# Patient Record
Sex: Male | Born: 1965
Health system: Southern US, Community
[De-identification: ages and names within clinical notes are randomized; demographics above are authoritative.]

## PROBLEM LIST (undated history)

## (undated) DIAGNOSIS — F329 Major depressive disorder, single episode, unspecified: Secondary | ICD-10-CM

## (undated) DIAGNOSIS — T7840XA Allergy, unspecified, initial encounter: Secondary | ICD-10-CM

## (undated) DIAGNOSIS — G4730 Sleep apnea, unspecified: Secondary | ICD-10-CM

## (undated) DIAGNOSIS — K219 Gastro-esophageal reflux disease without esophagitis: Secondary | ICD-10-CM

## (undated) DIAGNOSIS — M1990 Unspecified osteoarthritis, unspecified site: Secondary | ICD-10-CM

## (undated) DIAGNOSIS — F32A Depression, unspecified: Secondary | ICD-10-CM

## (undated) DIAGNOSIS — E291 Testicular hypofunction: Secondary | ICD-10-CM

## (undated) DIAGNOSIS — R51 Headache: Secondary | ICD-10-CM

## (undated) HISTORY — DX: Unspecified osteoarthritis, unspecified site: M19.90

## (undated) HISTORY — DX: Headache: R51

## (undated) HISTORY — DX: Allergy, unspecified, initial encounter: T78.40XA

## (undated) HISTORY — DX: Testicular hypofunction: E29.1

## (undated) HISTORY — DX: Depression, unspecified: F32.A

## (undated) HISTORY — DX: Major depressive disorder, single episode, unspecified: F32.9

---
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
## 1991-04-22 HISTORY — PX: NASAL SINUS SURGERY: SHX719

## 2008-12-16 ENCOUNTER — Ambulatory Visit: Payer: Self-pay | Admitting: Internal Medicine

## 2008-12-16 DIAGNOSIS — R51 Headache: Secondary | ICD-10-CM | POA: Insufficient documentation

## 2008-12-16 DIAGNOSIS — R519 Headache, unspecified: Secondary | ICD-10-CM | POA: Insufficient documentation

## 2008-12-16 DIAGNOSIS — R5381 Other malaise: Secondary | ICD-10-CM

## 2008-12-16 DIAGNOSIS — R5383 Other fatigue: Secondary | ICD-10-CM | POA: Insufficient documentation

## 2008-12-16 HISTORY — DX: Other fatigue: R53.83

## 2008-12-18 ENCOUNTER — Ambulatory Visit: Payer: Self-pay | Admitting: Internal Medicine

## 2008-12-18 LAB — CONVERTED CEMR LAB
ALT: 31 units/L (ref 0–53)
AST: 25 units/L (ref 0–37)
Albumin: 4.7 g/dL (ref 3.5–5.2)
Alkaline Phosphatase: 66 units/L (ref 39–117)
BUN: 16 mg/dL (ref 6–23)
Basophils Absolute: 0 10*3/uL (ref 0.0–0.1)
Basophils Relative: 0 % (ref 0–1)
Bilirubin, Direct: 0.3 mg/dL (ref 0.0–0.3)
CO2: 24 meq/L (ref 19–32)
Calcium: 9.9 mg/dL (ref 8.4–10.5)
Chloride: 100 meq/L (ref 96–112)
Cholesterol: 141 mg/dL (ref 0–200)
Creatinine, Ser: 1.03 mg/dL (ref 0.40–1.50)
Creatinine, Urine: 157.1 mg/dL
Eosinophils Absolute: 0.1 10*3/uL (ref 0.0–0.7)
Eosinophils Relative: 2 % (ref 0–5)
Glucose, Bld: 145 mg/dL — ABNORMAL HIGH (ref 70–99)
HCT: 45.7 % (ref 39.0–52.0)
HDL: 57 mg/dL (ref >39)
Hemoglobin: 14.5 g/dL (ref 13.0–17.0)
Hgb A1c MFr Bld: 9.8 % — ABNORMAL HIGH (ref 4.6–6.1)
Indirect Bilirubin: 1.2 mg/dL — ABNORMAL HIGH (ref 0.0–0.9)
LDL Cholesterol: 67 mg/dL (ref 0–99)
Lymphocytes Relative: 30 % (ref 12–46)
Lymphs Abs: 1.9 10*3/uL (ref 0.7–4.0)
MCHC: 31.7 g/dL (ref 30.0–36.0)
MCV: 85.3 fL (ref 78.0–100.0)
Microalb Creat Ratio: 3.4 mg/g (ref 0.0–30.0)
Microalb, Ur: 0.54 mg/dL (ref 0.00–1.89)
Monocytes Absolute: 0.6 10*3/uL (ref 0.1–1.0)
Monocytes Relative: 10 % (ref 3–12)
Neutro Abs: 3.8 10*3/uL (ref 1.7–7.7)
Neutrophils Relative %: 59 % (ref 43–77)
Platelets: 231 10*3/uL (ref 150–400)
Potassium: 4.5 meq/L (ref 3.5–5.3)
RBC: 5.36 M/uL (ref 4.22–5.81)
RDW: 13 % (ref 11.5–15.5)
Sodium: 139 meq/L (ref 135–145)
TSH: 1.473 microintl units/mL (ref 0.350–4.500)
Total Bilirubin: 1.5 mg/dL — ABNORMAL HIGH (ref 0.3–1.2)
Total CHOL/HDL Ratio: 2.5
Total Protein: 7.1 g/dL (ref 6.0–8.3)
Triglycerides: 84 mg/dL (ref <150)
VLDL: 17 mg/dL (ref 0–40)
WBC: 6.4 10*3/uL (ref 4.0–10.5)

## 2008-12-19 ENCOUNTER — Telehealth: Payer: Self-pay | Admitting: Internal Medicine

## 2008-12-23 ENCOUNTER — Ambulatory Visit: Payer: Self-pay | Admitting: Internal Medicine

## 2008-12-23 ENCOUNTER — Telehealth: Payer: Self-pay | Admitting: Internal Medicine

## 2009-01-02 ENCOUNTER — Telehealth: Payer: Self-pay | Admitting: Internal Medicine

## 2009-01-12 ENCOUNTER — Telehealth: Payer: Self-pay | Admitting: Internal Medicine

## 2009-02-17 ENCOUNTER — Telehealth: Payer: Self-pay | Admitting: Internal Medicine

## 2009-03-02 ENCOUNTER — Encounter: Payer: Self-pay | Admitting: Internal Medicine

## 2009-03-02 LAB — CONVERTED CEMR LAB
BUN: 11 mg/dL (ref 6–23)
CO2: 26 meq/L (ref 19–32)
Calcium: 9.5 mg/dL (ref 8.4–10.5)
Chloride: 99 meq/L (ref 96–112)
Creatinine, Ser: 1.03 mg/dL (ref 0.40–1.50)
Glucose, Bld: 252 mg/dL — ABNORMAL HIGH (ref 70–99)
Hgb A1c MFr Bld: 8.6 % — ABNORMAL HIGH (ref 4.6–6.1)
Potassium: 4.4 meq/L (ref 3.5–5.3)
Sodium: 137 meq/L (ref 135–145)

## 2009-03-04 ENCOUNTER — Ambulatory Visit: Payer: Self-pay | Admitting: Internal Medicine

## 2009-03-06 ENCOUNTER — Telehealth: Payer: Self-pay | Admitting: Internal Medicine

## 2009-03-23 ENCOUNTER — Ambulatory Visit: Payer: Self-pay | Admitting: Internal Medicine

## 2009-03-23 DIAGNOSIS — J018 Other acute sinusitis: Secondary | ICD-10-CM | POA: Insufficient documentation

## 2009-03-26 ENCOUNTER — Telehealth: Payer: Self-pay | Admitting: Internal Medicine

## 2009-04-07 ENCOUNTER — Ambulatory Visit: Payer: Self-pay | Admitting: Internal Medicine

## 2009-04-07 LAB — CONVERTED CEMR LAB: Hgb A1c MFr Bld: 9 % — ABNORMAL HIGH (ref 4.6–6.1)

## 2009-04-10 ENCOUNTER — Ambulatory Visit: Payer: Self-pay | Admitting: Internal Medicine

## 2009-04-10 LAB — CONVERTED CEMR LAB: Sed Rate: 18 mm/hr — ABNORMAL HIGH (ref 0–16)

## 2009-04-13 ENCOUNTER — Encounter: Payer: Self-pay | Admitting: Internal Medicine

## 2009-04-13 LAB — CONVERTED CEMR LAB
Ferritin: 88 ng/mL (ref 22–322)
Iron: 62 ug/dL (ref 42–165)
Saturation Ratios: 21 % (ref 20–55)
TIBC: 297 ug/dL (ref 215–435)
UIBC: 235 ug/dL

## 2009-05-08 ENCOUNTER — Telehealth: Payer: Self-pay | Admitting: Internal Medicine

## 2009-05-20 ENCOUNTER — Telehealth: Payer: Self-pay | Admitting: Internal Medicine

## 2009-05-26 ENCOUNTER — Ambulatory Visit: Payer: Self-pay | Admitting: Internal Medicine

## 2009-05-26 DIAGNOSIS — B356 Tinea cruris: Secondary | ICD-10-CM | POA: Insufficient documentation

## 2009-05-29 ENCOUNTER — Telehealth: Payer: Self-pay | Admitting: Internal Medicine

## 2009-06-08 ENCOUNTER — Telehealth: Payer: Self-pay | Admitting: Internal Medicine

## 2009-06-09 ENCOUNTER — Telehealth: Payer: Self-pay | Admitting: Internal Medicine

## 2009-07-20 ENCOUNTER — Ambulatory Visit: Payer: Self-pay | Admitting: Internal Medicine

## 2009-07-20 DIAGNOSIS — R300 Dysuria: Secondary | ICD-10-CM | POA: Insufficient documentation

## 2009-07-20 LAB — CONVERTED CEMR LAB
BUN: 11 mg/dL (ref 6–23)
CO2: 25 meq/L (ref 19–32)
Calcium: 9.7 mg/dL (ref 8.4–10.5)
Chloride: 100 meq/L (ref 96–112)
Creatinine, Ser: 0.94 mg/dL (ref 0.40–1.50)
Glucose, Bld: 194 mg/dL — ABNORMAL HIGH (ref 70–99)
Hgb A1c MFr Bld: 8.5 % — ABNORMAL HIGH (ref <5.7)
PSA: 0.34 ng/mL (ref 0.10–4.00)
Potassium: 4.3 meq/L (ref 3.5–5.3)
Sodium: 136 meq/L (ref 135–145)

## 2009-07-21 ENCOUNTER — Encounter: Payer: Self-pay | Admitting: Internal Medicine

## 2009-11-13 ENCOUNTER — Telehealth: Payer: Self-pay | Admitting: Internal Medicine

## 2009-11-26 ENCOUNTER — Ambulatory Visit: Payer: Self-pay | Admitting: Internal Medicine

## 2009-11-26 LAB — CONVERTED CEMR LAB
BUN: 9 mg/dL (ref 6–23)
CO2: 29 meq/L (ref 19–32)
Calcium: 9.1 mg/dL (ref 8.4–10.5)
Chloride: 99 meq/L (ref 96–112)
Creatinine, Ser: 0.98 mg/dL (ref 0.40–1.50)
Glucose, Bld: 295 mg/dL — ABNORMAL HIGH (ref 70–99)
Hgb A1c MFr Bld: 10.2 % — ABNORMAL HIGH (ref <5.7)
Potassium: 4.7 meq/L (ref 3.5–5.3)
Sodium: 136 meq/L (ref 135–145)

## 2009-11-27 ENCOUNTER — Telehealth: Payer: Self-pay | Admitting: Internal Medicine

## 2009-12-17 ENCOUNTER — Ambulatory Visit: Payer: Self-pay | Admitting: Internal Medicine

## 2009-12-17 DIAGNOSIS — S335XXA Sprain of ligaments of lumbar spine, initial encounter: Secondary | ICD-10-CM | POA: Insufficient documentation

## 2010-01-05 ENCOUNTER — Encounter: Payer: Self-pay | Admitting: Internal Medicine

## 2010-04-20 NOTE — Assessment & Plan Note (Signed)
Summary: head congestion cough/mhf   Vital Signs:  Patient profile:   45 year old male Height:      73 inches Weight:      208.25 pounds BMI:     27.57 O2 Sat:      97 % on Room air Temp:     97.9 degrees F oral Pulse rate:   98 / minute BP sitting:   110 / 70  (right arm)  Vitals Entered By: Lucious Groves (March 23, 2009 3:19 PM)  O2 Flow:  Room air CC: C/o congestion and cough x 2 weeks. Pt cough is dry now, but was previously producing green/yellow mucous. Robitussin with Codeine no help to pt./kb, URI symptoms Is Patient Diabetic? Yes Pain Assessment Patient in pain? no        Primary Care Provider:  Dondra Spry DO  CC:  C/o congestion and cough x 2 weeks. Pt cough is dry now, but was previously producing green/yellow mucous. Robitussin with Codeine no help to pt./kb, and URI symptoms.  History of Present Illness:  URI Symptoms      This is a 45 year old man who presents with URI symptoms.  The patient reports nasal congestion, purulent nasal discharge, sore throat, and productive cough.  The patient denies fever.  onset of symptoms - 2 weeks.  no improvement with self care measures  Current Medications (verified): 1)  Crestor 10 Mg Tabs (Rosuvastatin Calcium) .... One Tablet By Mouth Three Times Per Week 2)  Lexapro 20 Mg Tabs (Escitalopram Oxalate) .... Take 1 Tablet By Mouth Once A Day 3)  Testim Gel .... Rub One Half Tub Into Arms Once Per Day 4)  Janumet 50-1000 Mg Tabs (Sitagliptin-Metformin Hcl) .... One By Mouth Two Times A Day 5)  Humalog Mix 75/25 Kwikpen 75-25 % Susp (Insulin Lispro Prot & Lispro) .... Use 20-25 Units Two Times A Day 6)  Robitussin With Codeine .... At Bedtime Only 7)  Benadryl 25 Mg Tabs (Diphenhydramine Hcl) .Marland Kitchen.. 1 By Mouth Qam  Allergies (verified): 1)  ! Reglan 2)  ! Phenergan 3)  Actos (Pioglitazone Hcl)  Past History:  Past Medical History: Depression Diabetes mellitus, type II   Headache  Hyperlipidemia  Hypogonadism  (seen by endo Dr. 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   Neck:  supple, no masses, and no carotid bruits.   Lungs:  normal respiratory effort and normal breath sounds.   Heart:  normal rate, regular rhythm, and no gallop.   Extremities:  No lower extremity edema    Impression & Recommendations:  Problem # 1:  RHINOSINUSITIS, ACUTE (ICD-461.8) pt with 2 weeks of nasal congestion and purulent drainage.  take abx as directed.  use nasal saline irrigation.  Patient advised to call office if symptoms persist or worsen.  His updated medication list for this problem includes:    Cefuroxime Axetil 500 Mg Tabs (Cefuroxime axetil) ..... One by mouth two times a day    Tussionex Pennkinetic Er 8-10 Mg/86ml Lqcr (Chlorpheniramine-hydrocodone) ..... Use 5 ml two times a day prn  Complete Medication List: 1)  Crestor 10 Mg Tabs (Rosuvastatin calcium) .... One tablet by mouth three times  per week 2)  Lexapro 20 Mg Tabs (Escitalopram oxalate) .... Take 1 tablet by mouth once a day 3)  Testim Gel  .... Rub one half tub into arms once per day 4)  Janumet 50-1000 Mg Tabs (Sitagliptin-metformin hcl) .... One by mouth two times a day 5)  Humalog Mix 75/25 Kwikpen 75-25 % Susp (Insulin lispro prot & lispro) .... Use 20-25 units two times a day 6)  Robitussin With Codeine  .... At bedtime only 7)  Benadryl 25 Mg Tabs (Diphenhydramine hcl) .Marland Kitchen.. 1 by mouth qam 8)  Cefuroxime Axetil 500 Mg Tabs (Cefuroxime axetil) .... One by mouth two times a day 9)  Tussionex Pennkinetic Er 8-10 Mg/80ml Lqcr (Chlorpheniramine-hydrocodone) .... Use 5 ml two times a day prn  Patient Instructions: 1)  Use Neil Med sinus rinse 2)  Gargle with salt  water two times a day 3)  Call our office if your symptoms do not  improve or gets worse. Prescriptions: TUSSIONEX PENNKINETIC ER 8-10 MG/5ML LQCR (CHLORPHENIRAMINE-HYDROCODONE) use 5 ml two times a day prn  #120 ml x 0   Entered and Authorized by:   [name]   Signed by:   [name] on 03/23/2009   Method used:   Print then Give to Patient   RxID:   [id number] CEFUROXIME AXETIL 500 MG TABS (CEFUROXIME AXETIL) one by mouth two times a day  #20 x 0   Entered and Authorized by:   [name]   Signed by:   [name] on 03/23/2009   Method used:   Electronically to        [facility] [phone]* (retail)       [address]       [place], [place]  [address]       Ph: [phone] or [phone]       Fax: [phone]   RxID:   [id number]

## 2010-04-20 NOTE — Assessment & Plan Note (Signed)
Summary: 1 month follow up/mhf, resched- jr   Vital Signs:  Patient profile:   45 year old Nathaniel [name] Weight:      213.50 pounds BMI:     Nathaniel.27 O2 Sat:      98 % on Room air Temp:     97.5 degrees F oral Pulse rate:   66 / minute Pulse rhythm:   regular Resp:     18 per minute BP sitting:   96 / 60  (right arm) Cuff size:   large  Vitals Entered By: Glendell Docker CMA (April 10, 2009 8:23 AM)  O2 Flow:  Room air  Primary Care Provider:  [name]  CC:  1 Month Follow up, Headaches, and Type 2 diabetes mellitus follow-up.  History of Present Illness: 1 Month followup   Headaches      This is a 45 year old man who presents with Headaches.  The patient denies nausea, vomiting, tearing of eyes, nasal congestion, and sinus pain.  The headache is described as intermittent. headache is bilateral   Type 2 Diabetes Mellitus Follow-Up      The patient is also here for Type 2 diabetes mellitus follow-up.  The patient reports self managed hypoglycemia, but denies hypoglycemia requiring help.  The patient denies the following symptoms: chest pain.  Since the last visit the patient reports good dietary compliance, compliance with medications, exercising regularly, and monitoring blood glucose.  low blood sugar 54 high 280 avg 130- this blood sugar 100-elevation is usually in the afternoon  Preventive Screening-Counseling & Management  Alcohol-Tobacco     Smoking Status: never  Allergies: 1)  ! Reglan 2)  ! Phenergan 3)  Actos (Pioglitazone Hcl)  Past History:  Past Medical History: Depression Diabetes mellitus, type II   Headache   Hyperlipidemia  Hypogonadism (seen by endo Dr. 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    Physical Exam  General:  alert, well-developed, and well-nourished.   Eyes:  pupils equal, pupils round, and pupils reactive to light.   Ears:  R ear normal and L ear normal.   Lungs:  normal respiratory effort and normal breath sounds.   Heart:  normal rate, regular rhythm, and no gallop.   Extremities:  No lower extremity edema  Neurologic:  cranial nerves II-XII intact and gait normal.   Psych:  normally interactive, good eye contact, not anxious appearing, and not depressed appearing.     Impression & Recommendations:  Problem # 1:  HEADACHE (ICD-784.0) Pt with headaches x 2-3 days.  I suspect stress headache.  check sed rate.    Orders: T-Sed Rate (Automated) [phone])  Problem # 2:  DIABETES MELLITUS, TYPE II (ICD-250.00) Pt having hypoglycemia in PM.  he exercises at night.  pt advised to decrease PM dose of 75/25.   afternoon sugars are high when he eats larger meal.  use humalog at lunch depending on meal.  Pt has hx of hypogonadism.  check iron studies.  screen for hem His updated medication list for this problem includes:    Janumet 50-1000 Mg Tabs (Sitagliptin-metformin hcl) ..... One by mouth two times a day  Humalog Mix 75/25 Kwikpen 75-25 % Susp (Insulin lispro prot & lispro) ..... Use 10-20 units two times a day    Humalog Kwikpen 100 Unit/ml Soln (Insulin lispro (human)) ..... Use 5-10 units before lunch  Problem # 3:  DEPRESSION (ICD-311) stable.  refilled lexapro.  His updated medication list for this problem includes:    Lexapro 20 Mg Tabs (Escitalopram oxalate) .Marland Kitchen... Take 1 tablet by mouth once a day  Complete Medication List: 1)  Crestor 10 Mg Tabs (Rosuvastatin calcium) .... One tablet by mouth three times per week 2)  Lexapro 20 Mg Tabs (Escitalopram oxalate) .... Take 1 tablet by mouth once a day 3)  Janumet 50-1000 Mg Tabs (Sitagliptin-metformin hcl) .... One by mouth two times a day 4)  Humalog Mix 75/25 Kwikpen  75-25 % Susp (Insulin lispro prot & lispro) .... Use 10-20 units two times a day 5)  Metaxalone 800 Mg Tabs (Metaxalone) .... One by mouth two times a day as needed headache 6)  Humalog Kwikpen 100 Unit/ml Soln (Insulin lispro (human)) .... Use 5-10 units before lunch  Patient Instructions: 1)  Please schedule a follow-up appointment in 2 months. Prescriptions: HUMALOG KWIKPEN 100 UNIT/ML SOLN (INSULIN LISPRO (HUMAN)) use 5-10 units before lunch  #1 month x 2   Entered and Authorized by:   [name]   Signed by:   [name] on 04/10/2009   Method used:   Electronically to        [facility] [phone]* (retail)       [address]       [place], [place]  [address]       Ph: [phone] or [phone]       Fax: [phone]   RxID:   [id number] HUMALOG MIX 75/25 KWIKPEN 75-25 % SUSP (INSULIN LISPRO PROT & LISPRO) use 10-20 units two times a day  #1 month x 2   Entered and Authorized by:   [name]   Signed by:   [name] on 04/10/2009   Method used:   Electronically to        [id number] (retail)       [address]       [place], [place]  [address]       Ph: [phone] or [phone]       Fax: [phone]   RxID:   [id number] METAXALONE 800 MG TABS (METAXALONE) one by mouth two times a day as needed headache  #30 x 0   Entered and Authorized by:   [name]   Signed by:   [name] on 04/10/2009   Method used:   Electronically to        [facility] [phone]* (retail)       [address]       [place], [place]  [address]       Ph: [phone] or [phone]       Fax: [phone]   RxID:   [id number]     Current Allergies (reviewed today): ! REGLAN ! PHENERGAN ACTOS (PIOGLITAZONE HCL)

## 2010-04-20 NOTE — Progress Notes (Signed)
Summary: one touch ultra refill  Phone Note Refill Request Message from:  Patient on November 13, 2009 10:28 AM  Refills Requested: Medication #1:  One Touch Ultra Test Strips   [facility] Last seen May, 2011.  Pt states he is checking BS 4-5 times a day. I advised him per last office note it should be 2 hours after lunch and dinner. Pt stated he has always checked sugars in the morning and after every meal. Please advise. Nicki Guadalajara Fergerson CMA Duncan Dull)  November 13, 2009 10:30 AM   Next Appointment Scheduled: 11/26/09 Dr Artist Pais  Follow-up for Phone Call        ok to change rx to qid Follow-up by: [name],  November 13, 2009 5:06 PM  Additional Follow-up for Phone Call Additional follow up Details #1::        Refills sent to pharmacy. Message left on voicemail notifying pt.  Nicki Guadalajara Fergerson CMA Duncan Dull)  November 13, 2009 5:16 PM     New/Updated Medications: ONETOUCH ULTRA TEST  STRP (GLUCOSE BLOOD) check blood sugar four times a day. Prescriptions: ONETOUCH ULTRA TEST  STRP (GLUCOSE BLOOD) check blood sugar four times a day.  #100 x 3   Entered by:   Mervin Kung CMA (AAMA)   Authorized by:   [name]   Signed by:   Mervin Kung CMA (AAMA) on 11/13/2009   Method used:   Electronically to        [facility] [phone]* (retail)       [address]       [place], [place]  [address]       Ph: [phone] or [phone]       Fax: [phone]   RxID:   [id number]

## 2010-04-20 NOTE — Progress Notes (Signed)
Summary: SHOULD HE CONTINUE WITH METFORMIN OR JANUVAMET   Phone Note Call from Patient Call back at Home Phone [phone]   Caller: PT LIVE Call For: YOO  Summary of Call: SHOULD HE CONTINUE WITH METFORMIN OR THE JANUVMET  Initial call taken by: Roselle Locus,  May 29, 2009 11:29 AM  Follow-up for Phone Call        take janumet only Follow-up by: [name],  May 29, 2009 1:00 PM  Additional Follow-up for Phone Call Additional follow up Details #1::        left pt. a detailed mess. to only be taking the Januvmet. If their is any questions call the office Additional Follow-up by: Michaelle Copas,  May 29, 2009 1:41 PM

## 2010-04-20 NOTE — Letter (Signed)
   [place] [facility] [address] [place], [place]  [address]  [place] Phone: [phone]      Jul 21, 2009   JEFF Crego [address] [place], [place] [address]  RE:  LAB RESULTS  Dear  Mr. GURRY,  The following is an interpretation of your most recent lab tests.  Please take note of any instructions provided or changes to medications that have resulted from your lab work.  PSA:  normal - no follow-up needed PSA: 0.34  ELECTROLYTES:  Good - no changes needed  KIDNEY FUNCTION TESTS:  Stable - no changes needed    DIABETIC STUDIES:  Improved - continue management Blood Glucose: 194   HgbA1C: 8.5   Microalbumin/Creatinine Ratio: 3.4          Sincerely Yours,    Dr. Thomos Lemons

## 2010-04-20 NOTE — Progress Notes (Signed)
Summary: Glucose Meter  Phone Note Outgoing Call   Call placed by: Glendell Docker CMA,  June 09, 2009 4:27 PM Call placed to: Patient Summary of Call: call placed to patient at [phone] no answer, left voice message to return call regarding glucose meter Initial call taken by: Glendell Docker CMA,  June 09, 2009 4:28 PM  Follow-up for Phone Call        call placed to patient at [phone], patient states that he has been using the one touch meter for the last 10 years and he had test strips for that type of meter. He states he has ordered a meter and is awaiting the replacement.  In the mean time he is using his sons meter to check his blood sugar twice a day. Follow-up by: Glendell Docker CMA,  June 11, 2009 8:37 AM

## 2010-04-20 NOTE — Progress Notes (Signed)
Summary: Glucose Meter  Phone Note Call from Patient Call back at Home Phone [phone]   Caller: Patient Summary of Call: pt. has lost his glucometer for diabetic readings and was wondering if we have any here in the office he could get? Call patient back and let him know please. Thank you Initial call taken by: Michaelle Copas,  June 08, 2009 10:41 AM  Follow-up for Phone Call        call returned to patient at  [phone], he was informed a Accu- Chek glucose meter would be left at the front  desk for patient pick up Follow-up by: Glendell Docker CMA,  June 08, 2009 3:30 PM

## 2010-04-20 NOTE — Progress Notes (Signed)
Summary: diabetic question  Phone Note Call from Patient   Summary of Call: Pt. states that his insulin regimen he is on is not working out and he would like to discuss this with Dr.Yoo and wants Dr.Yoo to call him back at [phone] to come up with a plan until his appointment date that he moved up sooner to Tues. 05/26/09 at 10:30 Initial call taken by: Michaelle Copas,  May 20, 2009 10:49 AM  Follow-up for Phone Call        please try to get pt earlier appt Follow-up by: [name],  May 21, 2009 11:55 AM  Additional Follow-up for Phone Call Additional follow up Details #1::        called [phone] and no answer, so I left pt. a detailed message stating that Dr.Yoo wanted him to make a sooner appointment. Instructed pt. to call our office and reschedule that.Michaelle Copas  May 21, 2009 1:14 PM  Additional Follow-up by: Michaelle Copas,  May 21, 2009 1:14 PM

## 2010-04-20 NOTE — Progress Notes (Signed)
Summary: endo referral  Phone Note Outgoing Call   Summary of Call: call pt - blood sugar control is worse.  I suggest referral to endocrinologist.  see orders.  If he gets endo appt, he can extend next OV to 3 months Initial call taken by: [name],  November 27, 2009 1:00 PM  Follow-up for Phone Call        Notified pt per Dr Olegario Messier instructions. Pt stated he could not afford the $75 copay to see a specialist and if we weren't comfortable treating him he would find someone else. i advised pt that Dr Artist Pais was referring him to a specialist to better treat his diabetes and this was in the pt's best interest; not that he didn't want to treat the pt anymore. Pt voices understanding but states he still cannot see a specialist due to cost. Mervin Kung CMA Duncan Dull)  November 27, 2009 1:59 PM   Additional Follow-up for Phone Call Additional follow up Details #1::        then keep next OV.   Additional Follow-up by: [name],  November 27, 2009 5:10 PM    Additional Follow-up for Phone Call Additional follow up Details #2::    call placed to patient at [phone], no answer. A detailed voice message was left informing patient per Dr Artist Pais instructions. He was advised to call if any questions Follow-up by: Glendell Docker CMA,  November 30, 2009 8:37 AM

## 2010-04-20 NOTE — Assessment & Plan Note (Signed)
Summary: 2 MONTH FOLLOW UP/MHF, resched- jr   Vital Signs:  Patient profile:   45 year old male Weight:      211 pounds BMI:     27.94 O2 Sat:      100 % on Room air Temp:     97.3 degrees F oral Pulse rate:   72 / minute Pulse rhythm:   regular Resp:     18 per minute BP sitting:   120 / 80  (right arm) Cuff size:   large  Vitals Entered By: Glendell Docker CMA (May 26, 2009 10:44 AM)  O2 Flow:  Room air CC: Rm 3- 2 Month follow up disease management, Type 2 diabetes mellitus follow-up Is Patient Diabetic? Yes  Does patient need assistance? Functional Status Self care Comments low bood sugar 100 high 330 avg 180-190 elevation is late afternoon, this am was 140, had been exercising   Primary Care Provider:  [name]  CC:  Rm 3- 2 Month follow up disease management and Type 2 diabetes mellitus follow-up.  History of Present Illness:  Type 2 Diabetes Mellitus Follow-Up      This is a 45 year old man who presents for Type 2 diabetes mellitus follow-up.  The patient denies polyuria, polydipsia, and self managed hypoglycemia.  The patient denies the following symptoms: chest pain.  Since the last visit the patient reports good dietary compliance, not exercising regularly, and monitoring blood glucose.  blood sugars getting worse  He also c/o redness and skin irritation right groin.  chaffing with exericise  Allergies: 1)  ! Reglan 2)  ! Phenergan 3)  Actos (Pioglitazone Hcl)  Past History:  Past Medical History: Depression Diabetes mellitus, type II   Headache   Hyperlipidemia   Hypogonadism (seen by endo Dr. 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   Lungs:  normal respiratory effort and normal breath sounds.   Heart:  normal rate, regular rhythm,  and no gallop.   Extremities:  No lower extremity edema  Psych:  normally interactive, good eye contact, not anxious appearing, and not depressed appearing.     Impression & Recommendations:  Problem # 1:  DIABETES MELLITUS, TYPE II (ICD-250.00) Assessment Deteriorated Blood sugars getting worse.  DC 75/25.  resume lantus and short acting insulin before meals.  Pt will carb count and adjust short acting insulin according.  Also use less insulin before exercise.  The following medications were removed from the medication list:    Humalog Mix 75/25 Kwikpen 75-25 % Susp (Insulin lispro prot & lispro) ..... Inject subcutaneously 22 units once daily    Humalog Kwikpen 100 Unit/ml Soln (Insulin lispro (human)) ..... Use 5-10 units before lunch His updated medication list for this problem includes:    Janumet 50-1000 Mg Tabs (Sitagliptin-metformin hcl) ..... One by mouth two times a day    Lantus Solostar 100 Unit/ml Soln (Insulin glargine) .Marland KitchenMarland KitchenMarland KitchenMarland Kitchen 35-45 units at bedtime    Apidra Solostar 100 Unit/ml Soln (Insulin glulisine) .Marland KitchenMarland KitchenMarland KitchenMarland Kitchen 10-20 units before meal three times a day  Problem # 2:  DEPRESSION (ICD-311) Assessment: Deteriorated He tried to taper off but felt significantly more irritable.  lexapro is cost prohibitive.  change to sertraline.  His updated medication list for  this problem includes:    Sertraline Hcl 100 Mg Tabs (Sertraline hcl) ..... One by mouth once daily  Problem # 3:  TINEA CRURIS (ICD-110.3) Use nystatin powder to keep area dry.  Use lamisil cream at bedtime.  Complete Medication List: 1)  Crestor 10 Mg Tabs (Rosuvastatin calcium) .... One tablet by mouth three times per week 2)  Sertraline Hcl 100 Mg Tabs (Sertraline hcl) .... One by mouth once daily 3)  Janumet 50-1000 Mg Tabs (Sitagliptin-metformin hcl) .... One by mouth two times a day 4)  Metaxalone 800 Mg Tabs (Metaxalone) .... One by mouth two times a day as needed headache 5)  Lantus Solostar 100 Unit/ml Soln  (Insulin glargine) .... 35-45 units at bedtime 6)  Apidra Solostar 100 Unit/ml Soln (Insulin glulisine) .Marland Kitchen.. 10-20 units before meal three times a day 7)  Pedi-dri 100000 Unit/gm Powd (Nystatin) .... Use bid as directed  Patient Instructions: 1)  Please schedule a follow-up appointment in 1 month. Prescriptions: PEDI-DRI 100000 UNIT/GM POWD (NYSTATIN) use bid as directed  #1 month x 0   Entered and Authorized by:   [name]   Signed by:   [name] on 05/26/2009   Method used:   Electronically to        [id number] (retail)       [address]       [place], [place]  [address]       Ph: [phone] or [phone]       Fax: [phone]   RxID:   [id number] SERTRALINE HCL 100 MG TABS (SERTRALINE HCL) one by mouth once daily  #30 x 2   Entered and Authorized by:   [name]   Signed by:   [name] on 05/26/2009   Method used:   Electronically to        [id number] (retail)       [address]       [place], [place]  [address]       Ph: [phone] or [phone]       Fax: [phone]   RxID:   [id number] APIDRA SOLOSTAR 100 UNIT/ML SOLN (INSULIN GLULISINE) 10-20 units before meal three times a day  #1 month x 2   Entered and Authorized by:   [name]   Signed by:   [name] on 05/26/2009   Method used:   Electronically to        [id number] (retail)       [address]       [place], [place]  [address]       Ph: [phone] or [phone]       Fax: [phone]   RxID:   [id number] LANTUS SOLOSTAR 100 UNIT/ML SOLN (INSULIN GLARGINE) 35-45 units at bedtime  #1 month x 2   Entered and Authorized by:   [name]   Signed by:   [name] on 05/26/2009   Method used:   Electronically to        [facility] [phone]* (retail)       [address]       [place], [place]  [address]       Ph: [phone] or [phone]       Fax: [phone]   RxID:    [id number]   Current Allergies (reviewed today): ! REGLAN ! PHENERGAN ACTOS (PIOGLITAZONE  HCL)

## 2010-04-20 NOTE — Assessment & Plan Note (Signed)
Summary: FOLLOW UP/MHF   Vital Signs:  Patient profile:   45 year old male Height:      73 inches Weight:      205.50 pounds BMI:     27.21 Temp:     98.0 degrees F oral Pulse rate:   102 / minute Pulse rhythm:   regular Resp:     18 per minute BP sitting:   110 / 84  (right arm) Cuff size:   regular  Vitals Entered By: Mervin Kung CMA (Jul 20, 2009 3:10 PM) CC: room 2  Follow up of diabetes.  Pt. would also like to have his prostate checked., Type 2 diabetes mellitus follow-up Is Patient Diabetic? Yes Comments Low BS-- 50  High BS-- 250  Avg  BS-- 150   Primary Care Provider:  DThomos Lemons DO  CC:  room 2  Follow up of diabetes.  Pt. would also like to have his prostate checked. and Type 2 diabetes mellitus follow-up.  History of Present Illness:  Type 2 Diabetes Mellitus Follow-Up      This is a 45 year old man who presents for Type 2 diabetes mellitus follow-up.  The patient denies polyuria, polydipsia, and hypoglycemia requiring help.  The patient denies the following symptoms: chest pain.  Since the last visit the patient reports good dietary compliance and monitoring blood glucose.  AM blood sugar 80-140  patient found out his birth father had prostate cancer.  He requests prostate cancer screening  Allergies: 1)  ! Reglan 2)  ! 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   Lungs:  normal respiratory effort and normal breath sounds.   Heart:  normal rate, regular rhythm,  and no gallop.   Abdomen:  soft, non-tender, and normal bowel sounds.   Prostate:  no gland enlargement, no nodules, and no asymmetry.  unable to fully palpate prostate gland   Impression & Recommendations:  Problem # 1:  DIABETES MELLITUS, TYPE II (ICD-250.00) Assessment Improved blood sugar improved with restart of lantus and meal time insulin.  we discussed carb counting and adjusting mealtime insulin dose accordingiy   The following medications were removed from the medication list:    Apidra Solostar 100 Unit/ml Soln (Insulin glulisine) .Marland KitchenMarland KitchenMarland KitchenMarland Kitchen 10-20 units before meal three times a day His updated medication list for this problem includes:    Janumet 50-1000 Mg Tabs (Sitagliptin-metformin hcl) ..... One by mouth two times a day    Lantus Solostar 100 Unit/ml Soln (Insulin glargine) .Marland Kitchen... 28 units at bedtime    Humalog 100 Unit/ml Soln (Insulin lispro (human)) .Marland KitchenMarland KitchenMarland KitchenMarland Kitchen 10-20 units before meal three times a day  Orders: T- Hemoglobin A1C ([address]) T-Basic Metabolic Panel [phone])  Labs Reviewed: Creat: 0.94 (04/07/2009)    Reviewed HgBA1c results: 9.0 (04/07/2009)  8.6 (03/02/2009)  Problem # 2:  SPECIAL SCREENING MALIGNANT NEOPLASM OF PROSTATE (ICD-V76.44) Family hx of prostate ca.  we  discussed limitations of prostate ca screening. limited prostate exam but no palpable abnormality  Problem # 3:  DYSURIA (ICD-788.1)  Orders: T-PSA ([address]454) UA Dipstick w/o Micro (manual) ([address])  Complete Medication List: 1)  Crestor 10 Mg Tabs (Rosuvastatin calcium) .... One tablet by mouth two times per week 2)  Sertraline Hcl 100 Mg Tabs (Sertraline hcl) .... One by mouth once daily 3)  Janumet 50-1000 Mg Tabs (Sitagliptin-metformin hcl) .... One by mouth two times a day 4)  Lantus Solostar 100 Unit/ml Soln (Insulin glargine) .... 28 units at bedtime 5)  Humalog 100 Unit/ml Soln (Insulin lispro (human)) .Marland Kitchen.. 10-20 units before meal three times a day  Patient  Instructions: 1)  Check your blood sugars 2 hrs after lunch and dinner 2)  Keep blood sugar log 3)  Please schedule a follow-up appointment in 3 months. Prescriptions: HUMALOG 100 UNIT/ML SOLN (INSULIN LISPRO (HUMAN)) 10-20 units before meal three times a day  #1 month x 3   Entered and Authorized by:   [name]   Signed by:   [name] on 07/20/2009   Method used:   [facility]        [facility] [phone]* (retail)       [address]       [place], [place]  [address]       Ph: [phone] or [phone]       Fax: [phone]   RxID:   [id number]   Current Allergies (reviewed today): ! REGLAN ! PHENERGAN ACTOS (PIOGLITAZONE HCL)

## 2010-04-20 NOTE — Miscellaneous (Signed)
Summary: December Lab Orders  Clinical Lists Changes  Orders: Added new Test order of T-Basic Metabolic Panel [id number]) - Signed Added new Test order of T- Hemoglobin A1C [phone]) - Signed Added new Test order of T-Lipid Profile ([id number]) - Signed Added new Test order of TLB-ALT (SGPT) (84460-ALT) - Signed Added new Test order of TLB-AST (SGOT) (84450-SGOT) - Signed

## 2010-04-20 NOTE — Progress Notes (Signed)
Summary: What labs does he need prior to visit?  Phone Note Call from Patient   Caller: Patient Details for Reason: what labs does he need ? Summary of Call: Pt. called and stated that he has a f/u appt. with Dr.Yoo on 1/14 and would like to get labs done prior to that visit. Will you tell me what to order if pt. needs labs, & I will call and set him a lab appt. up with him. Thank you Initial call taken by: Michaelle Copas,  March 26, 2009 1:45 PM  Follow-up for Phone Call        bmet, and A1c  250.02 Follow-up by: [name],  March 26, 2009 5:14 PM  Additional Follow-up for Phone Call Additional follow up Details #1::        Called pt. and LM on Vm stating for pt, to call our office so I could schedule his labs to be done before his f/u with Dr.Yoo. I did go ahead and put labs in the computer that Dr.Yoo ordered Additional Follow-up by: Michaelle Copas,  March 27, 2009 1:48 PM

## 2010-04-20 NOTE — Assessment & Plan Note (Signed)
Summary: 3 month follow up/mhf rsch from bmp list/dt   Vital Signs:  Patient profile:   45 year old male Height:      73 inches Weight:      205 pounds BMI:     27.14 O2 Sat:      98 % on Room air Temp:     97.5 degrees F oral Pulse rate:   67 / minute Pulse rhythm:   regular Resp:     16 per minute BP sitting:   100 / 70  (right arm) Cuff size:   large  Vitals Entered By: Glendell Docker CMA (November 26, 2009 8:29 AM)  O2 Flow:  Room air  Contraindications/Deferment of Procedures/Staging:    Treatment: Flu Shot    Contraindication: allergy     Test/Procedure: FLU VAX    Reason for deferment: patient declined  CC: 3 month follow up Is Patient Diabetic? Yes Did you bring your meter with you today? No Pain Assessment Patient in pain? no      Comments has not been using Lantus consistently due to low blood sugars at night, low blood sugar 55 high 300 avg 200, elevation is usually in the evening   Primary Care Ashtan Laton:  DThomos Lemons DO  CC:  3 month follow up.  History of Present Illness: 45 y/o male for DM II - follow up pt getting up 2-3 AM with low blood sugar  (50-55)  started school since previous visit  gets to bed at midnight - wakes up 6 AM dinner is 5:30 PM  taking lantus at bedtime  intermittent lantus use - AM blood sugar 160's  Preventive Screening-Counseling & Management  Alcohol-Tobacco     Smoking Status: never  Allergies: 1)  ! Reglan 2)  ! 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   Lungs:  normal respiratory effort and normal breath sounds.   Heart:  normal rate, regular rhythm, and no gallop.   Psych:  normally interactive and good eye contact.     Impression & Recommendations:  Problem # 1:  DIABETES MELLITUS, TYPE II (ICD-250.00) Pt having low blood sugars in the middle of night.  he is only taking lantus intermittently.  he is not tracking carb intake.  not always checking his blood sugar reduce lantus dose.  consult endo  His updated medication list for this problem includes:    Janumet 50-1000 Mg Tabs (Sitagliptin-metformin hcl) ..... One by mouth two times a day    Lantus Solostar 100 Unit/ml Soln (Insulin glargine) .Marland KitchenMarland KitchenMarland KitchenMarland Kitchen 10 units at bedtime    Humalog 100 Unit/ml Soln (Insulin lispro (human)) .Marland Kitchen... 5- 15 units before meal three times a day  Orders: T- Hemoglobin A1C ([address]) T-Basic Metabolic Panel ([address]-9[address])  Complete Medication List: 1)  Crestor 10 Mg Tabs (Rosuvastatin calcium) .... One tablet by mouth two times per week 2)  Sertraline Hcl 100 Mg Tabs (Sertraline hcl) .... One by mouth once daily 3)  Janumet 50-1000 Mg Tabs (Sitagliptin-metformin hcl) .... One by mouth two times a day 4)  Lantus Solostar 100 Unit/ml Soln (Insulin glargine) .Marland Kitchen.. 10 units at bedtime 5)  Humalog 100 Unit/ml Soln (Insulin lispro (human)) .... 5- 15 units before meal three times a day 6)  Onetouch Ultra Test Strp (Glucose blood) .... Check blood sugar four times a day.  Patient Instructions: 1)  Bring carbohydrate and blood sugar log to your next office visit 2)  Please schedule a follow-up appointment in 1 month. Prescriptions: LANTUS SOLOSTAR 100 UNIT/ML SOLN (INSULIN GLARGINE) 10 units at bedtime  #1 month x 1   Entered and Authorized by:   [name]   Signed by:   [name] on 11/26/2009   Method used:   [facility]        [facility] [phone]* (retail)       [address]        [place], [place]  [address]       Ph: [phone] or [phone]       Fax: [phone]   RxID:   [id number] HUMALOG 100 UNIT/ML SOLN (INSULIN LISPRO (HUMAN)) 5- 15 units before meal three times a day  #1 month x 2   Entered and Authorized by:   [name]   Signed by:   [name] on 11/26/2009   Method used:   [facility]        [facility] [phone]* (retail)       [address]       [place], [place]  [address]       Ph: [phone] or [phone]       Fax: [phone]   RxID:   [id number]      Immunization History:  Influenza Immunization History:    Influenza:  allergic (11/26/2009)   Current Allergies (reviewed today): ! REGLAN ! PHENERGAN ACTOS (PIOGLITAZONE HCL)

## 2010-04-20 NOTE — Assessment & Plan Note (Signed)
Summary: 1 MONTH FOLLOW UP/MHF rsch, pt hurt back/dt--Rm 3   Vital Signs:  Patient profile:   45 year old male Height:      73 inches Weight:      205.75 pounds BMI:     27.24 O2 Sat:      98 % on Room air Temp:     97.8 degrees F oral Pulse rate:   79 / minute Pulse rhythm:   regular Resp:     16 per minute BP sitting:   100 / 70  (left arm) Cuff size:   regular  Vitals Entered By: Mervin Kung CMA Duncan Dull) (December 17, 2009 9:38 AM)  O2 Flow:  Room air CC: Rm 3  1 month f/u. Is Patient Diabetic? Yes Comments Pt states he woke up last Friday morning with back pain mostly on the left side.  Pt has increased Lantus to 32 units at bedtime. Nicki Guadalajara Fergerson CMA Duncan Dull)  December 17, 2009 9:40 AM    Primary Care Provider:  Dondra Spry DO  CC:  Rm 3  1 month f/u.Marland Kitchen  History of Present Illness: 45 y/o male c/o left sided back pain woke up last friday with left sided back pain throbbing sensation,  occ pinching sensation some spasms a few days no urinary symptoms  Preventive Screening-Counseling & Management  Alcohol-Tobacco     Alcohol drinks/day: 0     Alcohol Counseling: not indicated; patient does not drink     Smoking Status: never     Tobacco Counseling: not indicated; no tobacco use  Allergies: 1)  ! Reglan 2)  ! 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   Lungs:  normal respiratory effort and normal breath sounds.   Heart:  normal rate, regular rhythm, and no gallop.   Extremities:  No lower extremity edema  Neurologic:  cranial nerves II-XII intact and gait normal.     Impression & Recommendations:  Problem # 1:   BACK STRAIN, LUMBAR (ICD-847.2) no sign or symptoms of lumbar radiculopathy use muscle relaxers he declines PT referral.  reviewed home stretching exercises  Complete Medication List: 1)  Crestor 10 Mg Tabs (Rosuvastatin calcium) .... One tablet by mouth two times per week 2)  Sertraline Hcl 100 Mg Tabs (Sertraline hcl) .... One by mouth once daily 3)  Janumet 50-1000 Mg Tabs (Sitagliptin-metformin hcl) .... One by mouth two times a day 4)  Lantus Solostar 100 Unit/ml Soln (Insulin glargine) .Marland Kitchen.. 10 units at bedtime 5)  Humalog 100 Unit/ml Soln (Insulin lispro (human)) .... 5- 15 units before meal three times a day 6)  Onetouch Ultra Test Strp (Glucose blood) .... Check blood sugar four times a day. 7)  Cyclobenzaprine Hcl 5 Mg Tabs (Cyclobenzaprine hcl) .... One by mouth once daily as needed for low back pain  Patient Instructions: 1)  Please schedule a follow-up appointment in 3 months. 2)  BMP prior to visit, ICD-9:  401.9 3)  HbgA1C prior to visit, ICD-9: 250.02 4)  FLP, AST, ALT:  272.4 5)  Perform stretching exercises as directed 6)  Please return for lab work one (  1) week before your next appointment.  Prescriptions: CYCLOBENZAPRINE HCL 5 MG TABS (CYCLOBENZAPRINE HCL) one by mouth once daily as needed for low back pain  #30 x 0   Entered and Authorized by:   [name]   Signed by:   [name] on 12/17/2009   Method used:   [facility] to        [facility] [phone]* (retail)       [address]       [place], [place]  [address]       Ph: [phone] or [phone]       Fax: [phone]   RxID:   [id number]   Current Allergies (reviewed today): ! REGLAN ! PHENERGAN ACTOS (PIOGLITAZONE HCL)

## 2010-04-20 NOTE — Letter (Signed)
   [place] [facility] [address] [place] [address] [place], [place]  [address]  [place] Phone: [phone]      April 13, 2009   [name] [address] [place], [place] [address]  RE:  LAB RESULTS  Dear  [name],  The following is an interpretation of your most recent lab tests.  Please take note of any instructions provided or changes to medications that have resulted from your lab work.     Sed rate - minimally elevated.    Iron studies - normal       Sincerely Yours,    Dr. Thomos Lemons

## 2010-04-20 NOTE — Progress Notes (Signed)
Summary: Medication Refill  Phone Note Refill Request Message from:  Patient on May 08, 2009 8:37 AM  Refills Requested: Medication #1:  CRESTOR 10 MG TABS one tablet by mouth three times per week  Medication #2:  HUMALOG MIX 75/25 KWIKPEN 75-25 % SUSP use 10-20 units two times a day [facility] [place]  Next Appointment Scheduled: 06/10/09 Initial call taken by: Michaelle Copas,  May 08, 2009 8:37 AM Caller: Patient  Follow-up for Phone Call        Rx completed in Dr. Tiajuana Amass Follow-up by: Glendell Docker CMA,  May 08, 2009 10:36 AM    Prescriptions: HUMALOG MIX 75/25 KWIKPEN 75-25 % SUSP (INSULIN LISPRO PROT & LISPRO) use 10-20 units two times a day  #1 x 5   Entered by:   Glendell Docker CMA   Authorized by:   [name]   Signed by:   Glendell Docker CMA on 05/08/2009   Method used:   [facility]  [phone] ([facility]       [address]       [place], [place]  [address]       Ph: [phone] or [phone]       Fax: [phone]   RxID:   [id number] CRESTOR 10 MG TABS (ROSUVASTATIN CALCIUM) one tablet by mouth three times per week  #30 x 5   Entered by:   Glendell Docker CMA   Authorized by:   [name]   Signed by:   Glendell Docker CMA on 05/08/2009   Method used:   [facility]        [facility]  [address] [phone]* (retail)       [address]       [place], [place]  [address]       Ph: [phone] or [phone]       Fax: [phone]   RxID:   [id number]

## 2010-04-23 ENCOUNTER — Ambulatory Visit (INDEPENDENT_AMBULATORY_CARE_PROVIDER_SITE_OTHER): Payer: BC Managed Care – PPO | Admitting: Emergency Medicine

## 2010-04-23 ENCOUNTER — Other Ambulatory Visit: Payer: Self-pay | Admitting: Emergency Medicine

## 2010-04-23 ENCOUNTER — Ambulatory Visit
Admission: RE | Admit: 2010-04-23 | Discharge: 2010-04-23 | Disposition: A | Discharge status: Home / Self Care | Payer: BC Managed Care – PPO | Source: Ambulatory Visit | Attending: Emergency Medicine | Admitting: Emergency Medicine

## 2010-04-23 ENCOUNTER — Encounter: Payer: Self-pay | Admitting: Emergency Medicine

## 2010-04-23 DIAGNOSIS — R059 Cough, unspecified: Secondary | ICD-10-CM

## 2010-04-23 DIAGNOSIS — R05 Cough: Secondary | ICD-10-CM

## 2010-04-23 DIAGNOSIS — J069 Acute upper respiratory infection, unspecified: Secondary | ICD-10-CM

## 2010-04-27 ENCOUNTER — Telehealth (INDEPENDENT_AMBULATORY_CARE_PROVIDER_SITE_OTHER): Payer: Self-pay | Admitting: *Deleted

## 2010-04-28 NOTE — Assessment & Plan Note (Signed)
Summary: COUGH,SINUS PROBLEMS/TJ   Vital Signs:  Patient profile:   45 year old male Height:      73 inches Weight:      199 pounds BMI:     26.35 O2 Sat:      97 % on Room air Temp:     98.7 degrees F oral Pulse rate:   105 / minute Resp:     20 per minute BP sitting:   133 / 77  (left arm) Cuff size:   regular  Vitals Entered By: Clemens Catholic LPN (April 23, 2010 5:18 PM)  O2 Flow:  Room air History of Present Illness Chief Complaint: cough   CC: cough Comments pt c/o cough, runny nose, body aches x 2wks. Vommited x 3 on Wed. he is taking Benzonatate, zithromax, Hydromet from [facility] with no relief.   Chief Complaint:  cough.  History of Present Illness: 45 Years Old Male complains of onset of cold symptoms for 2 weeks.  He went to a doctor who gave him Hydromet, Tessalon, Prednison and Zpak which didn't help.  He is diabetic and his sugars have been in the mid to upper 100s + sore throat +++ cough No pleuritic pain No wheezing + nasal congestion + post-nasal drainage + sinus pain/pressure No chest congestion No itchy/red eyes No earache No hemoptysis No SOB No chills/sweats No fever + nausea + vomiting No abdominal pain No diarrhea No skin rashes No fatigue No myalgias + headache   Current Medications (verified): 1)  Crestor 10 Mg Tabs (Rosuvastatin Calcium) .... One Tablet By Mouth Two Times Per Week 2)  Sertraline Hcl 100 Mg Tabs (Sertraline Hcl) .... One By Mouth Once Daily 3)  Janumet 50-1000 Mg Tabs (Sitagliptin-Metformin Hcl) .... One By Mouth Two Times A Day 4)  Lantus Solostar 100 Unit/ml Soln (Insulin Glargine) .Marland Kitchen.. 10 Units At Bedtime 5)  Humalog 100 Unit/ml Soln (Insulin Lispro (Human)) .... 5- 15 Units Before Meal Three Times A Day 6)  Onetouch Ultra Test  Strp (Glucose Blood) .... Check Blood Sugar Four Times A Day.  Allergies (verified): 1)  ! Reglan 2)  ! Phenergan 3)  Actos (Pioglitazone Hcl)  Past History:  Past  Medical History: Reviewed history from 12/17/2009 and no changes required. Depression Diabetes mellitus, type II   Headache     Hyperlipidemia    Hypogonadism   Past Surgical History: Reviewed history from 11/26/2009 and no changes required. Tonsillectomy -1972   Sinus Surgery Feb 93         Family History: Reviewed history from 11/26/2009 and no changes required. Adopted -Unknown      Birth father - prostate cancer  (died age 90)     Social History: Reviewed history from 12/17/2009 and no changes required. 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  Otoscopic examination reveals clear canals, tympanic membranes are intact bilaterally without bulging, retraction, inflammation or discharge. Hearing is grossly normal bilaterally. Nose:  External nasal examination shows no deformity or inflammation. Nasal mucosa are pink and moist without lesions or exudates. Mouth:  mild erythema, no exudates Lungs:  Normal respiratory effort, chest expands symmetrically. Lungs are clear to auscultation, no crackles or wheezes.  Heart:  Normal rate and regular rhythm. S1 and S2 normal without gallop, murmur, click, rub or other extra sounds. Psych:  Cognition and judgment appear intact. Alert and cooperative with normal attention span and concentration. No apparent delusions, illusions, hallucinations   Impression & Recommendations:  Problem # 1:  COUGH (ICD-786.2)  1) IM Rocephin given today. Rx for Codeine tabs for severe cough.  Hold off on prednisone since diabetic.  Also try Zantac and Albuterol. 2)  Use nasal saline solution (over the counter) at least 3 times a day.  Be really good with  increased hydration to stop the cough cycle. 3)  Use over the counter decongestants like Zyrtec-D every 12 hours as needed to help with congestion. 4)  Can take tylenol every 6 hours or motrin every 8 hours for pain or fever. 5)  Follow up with your primary doctor if no improvement in 5-7 days, sooner if increasing pain, fever, or new symptoms.  May need pulmonology.  CXR is normal.  Orders: Rocephin  250mg  (M5784)  Complete Medication List: 1)  Crestor 10 Mg Tabs (Rosuvastatin calcium) .... One tablet by mouth two times per week 2)  Sertraline Hcl 100 Mg Tabs (Sertraline hcl) .... One by mouth once daily 3)  Janumet 50-1000 Mg Tabs (Sitagliptin-metformin hcl) .... One by mouth two times a day 4)  Lantus Solostar 100 Unit/ml Soln (Insulin glargine) .Marland Kitchen.. 10 units at bedtime 5)  Humalog 100 Unit/ml Soln (Insulin lispro (human)) .... 5- 15 units before meal three times a day 6)  Onetouch Ultra Test Strp (Glucose blood) .... Check blood sugar four times a day. 7)  Codeine Sulfate 30 Mg Tabs (Codeine sulfate) .Marland Kitchen.. 1 by mouth q6 hrs as needed for cough 8)  Ventolin Hfa 108 (90 Base) Mcg/act Aers (Albuterol sulfate) .... 2 puffs q4-6 hrs as needed shortness of breath 9)  Ranitidine Hcl 150 Mg Caps (Ranitidine hcl) .Marland Kitchen.. 1 by mouth two times a day for 2 weeks  Other Orders: T-Chest x-ray, 2 views ([address]) Capillary Blood Glucose/CBG (69629) Admin of Therapeutic Inj  intramuscular or subcutaneous ([address]) Prescriptions: RANITIDINE HCL 150 MG CAPS (RANITIDINE HCL) 1 by mouth two times a day for 2 weeks  #30 x 0   Entered and Authorized by:   Hoyt Koch MD   Signed by:   Hoyt Koch MD on 04/23/2010   Method used:   Print then Give to Patient   RxID:   [id number] CODEINE SULFATE 30 MG TABS (CODEINE SULFATE) 1 by mouth q6 hrs as needed for cough  #24 x 0   Entered and Authorized by:   Hoyt Koch MD   Signed by:   Hoyt Koch MD on 04/23/2010   Method used:   Print  then Give to Patient   RxID:   [id number]    Medication Administration  Injection # 1:    Medication: Rocephin  250mg     Diagnosis: URI (ICD-465.9)    Route: IM    Site: RUOQ gluteus    Exp Date: 07/19/2012    Lot #: [id number]    Mfr: sandoz    Comments: 1 gram given    Patient tolerated injection without complications    Given by: Clemens Catholic LPN (April 23, 2010 6:19 PM)  Orders Added: 1)  New Patient Level III [[address]203] 2)  T-Chest x-ray, 2 views [[address]20] 3)  Capillary Blood Glucose/CBG [82948] 4)  Rocephin  250mg  [J0696] 5)  Admin of Therapeutic Inj  intramuscular or subcutaneous [88416]

## 2010-05-06 NOTE — Progress Notes (Signed)
  Phone Note Outgoing Call Call back at [facility] Phone [phone]   Call placed by: Lajean Saver RN,  April 27, 2010 3:00 PM Call placed to: Patient Summary of Call: Callback: No answer Message left with reason for call and call back with questions or concerns

## 2010-05-26 ENCOUNTER — Encounter: Payer: Self-pay | Admitting: Internal Medicine

## 2010-05-26 LAB — CONVERTED CEMR LAB: Hgb A1c MFr Bld: 9.7 % — ABNORMAL HIGH (ref <5.7)

## 2010-05-28 ENCOUNTER — Telehealth: Payer: Self-pay | Admitting: Internal Medicine

## 2010-05-28 ENCOUNTER — Encounter: Payer: Self-pay | Admitting: Internal Medicine

## 2010-05-28 ENCOUNTER — Encounter (INDEPENDENT_AMBULATORY_CARE_PROVIDER_SITE_OTHER): Payer: Self-pay | Admitting: *Deleted

## 2010-05-28 ENCOUNTER — Ambulatory Visit (INDEPENDENT_AMBULATORY_CARE_PROVIDER_SITE_OTHER): Payer: BC Managed Care – PPO | Admitting: Internal Medicine

## 2010-05-28 ENCOUNTER — Other Ambulatory Visit: Payer: Self-pay | Admitting: Internal Medicine

## 2010-05-28 DIAGNOSIS — IMO0001 Reserved for inherently not codable concepts without codable children: Secondary | ICD-10-CM

## 2010-05-28 LAB — HM DIABETES FOOT EXAM

## 2010-05-29 LAB — MICROALBUMIN / CREATININE URINE RATIO
Creatinine, Urine: 48.9 mg/dL
Microalb Creat Ratio: 10.2 mg/g (ref 0.0–30.0)
Microalb, Ur: 0.5 mg/dL (ref 0.00–1.89)

## 2010-06-01 NOTE — Assessment & Plan Note (Signed)
Summary: FOLLOW UP PER DARLENE/MHF   Vital Signs:  Patient profile:   45 year old male Height:      73 inches Weight:      194.75 pounds BMI:     25.79 O2 Sat:      98 % on Room air Temp:     97.8 degrees F oral Pulse rate:   90 / minute Resp:     18 per minute BP sitting:   100 / 60  (right arm) Cuff size:   large  Vitals Entered By: Glendell Docker CMA (May 28, 2010 8:24 AM)  O2 Flow:  Room air CC: follow-up visit diabetes Is Patient Diabetic? Yes Did you bring your meter with you today? Yes Pain Assessment Patient in pain? no      Comments low blood sugar 65 high 300 avg 200-   Primary Care Provider:  Dondra Spry DO  CC:  follow-up visit diabetes.  History of Present Illness: 45 y/o male with uncontrolled diabetes for follow up last month - got a cold and couldn't exercise much he attributes poor diabetes control to lack of exercise  he is using humalog  (5 to 10 units)  3 x per day before meals using 28 units of lantus at bedtime CBG log reviewed  last eye exam 1.5 yrs ago  denies visual changes denies  paresthesias  Preventive Screening-Counseling & Management  Alcohol-Tobacco     Smoking Status: never  Allergies: 1)  ! Reglan 2)  ! Phenergan 3)  Actos (Pioglitazone Hcl)  Past History:  Past Medical History: Depression Diabetes mellitus, type II   Headache     Hyperlipidemia    Hypogonadism    Past Surgical History: Tonsillectomy -1972   Sinus Surgery Feb 93          Family History: Adopted -Unknown      Birth father - prostate cancer  (died age 3)      Social History: Occupation:  Mental Health case mgt Married - 17 years 1 son 10    1 daughter 25   Never Smoked    Alcohol use-no    Physical Exam  General:  alert, well-developed, and well-nourished.   Neck:  supple and no masses.   Lungs:  normal respiratory effort and normal breath sounds.   Heart:  normal rate, regular rhythm, and no gallop.   Extremities:  No lower  extremity edema Neurologic:  normal sensation to temp and vibration of lower ext  Diabetes Management Exam:    Foot Exam (with socks and/or shoes not present):       Inspection:          Left foot: normal          Right foot: normal       Nails:          Left foot: normal          Right foot: too long   Impression & Recommendations:  Problem # 1:  DIABETES MELLITUS, TYPE II, UNCONTROLLED (ICD-250.02)  patient with poor diabetes control. He does not carb count and has little insight into carb to insulin matching.   he also has fear of hypoglycemia.  Referred to diabetic educator for insulin teaching His updated medication list for this problem includes:    Janumet 50-1000 Mg Tabs (Sitagliptin-metformin hcl) ..... One by mouth two times a day    Lantus Solostar 100 Unit/ml Soln (Insulin glargine) ..... Inject 28  units subcutaneously at  bedtime    Humalog 100 Unit/ml Soln (Insulin lispro (human)) .Marland Kitchen... 5- 15 units before meal three times a day  Orders: T-Urine Microalbumin w/creat. ratio [phone]) Diabetic Clinic Referral (Diabetic)  Complete Medication List: 1)  Crestor 10 Mg Tabs (Rosuvastatin calcium) .... One tablet by mouth two times per week 2)  Sertraline Hcl 100 Mg Tabs (Sertraline hcl) .... One by mouth once daily 3)  Janumet 50-1000 Mg Tabs (Sitagliptin-metformin hcl) .... One by mouth two times a day 4)  Lantus Solostar 100 Unit/ml Soln (Insulin glargine) .... Inject 28  units subcutaneously at bedtime 5)  Humalog 100 Unit/ml Soln (Insulin lispro (human)) .... 5- 15 units before meal three times a day 6)  Onetouch Ultra Test Strp (Glucose blood) .... Check blood sugar four times a day.  Other Orders: Ophthalmology Referral (Ophthalmology)   Patient Instructions: 1)  Please schedule a follow-up appointment in 1 month.   Orders Added: 1)  T-Urine Microalbumin w/creat. ratio [[id number]] 2)  Ophthalmology Referral [Ophthalmology] 3)  Diabetic Clinic  Referral [Diabetic] 4)  Est. Patient Level III [78469]     Current Allergies (reviewed today): ! REGLAN ! PHENERGAN ACTOS (PIOGLITAZONE HCL)

## 2010-06-01 NOTE — Letter (Signed)
Summary: Primary Care Consult Scheduled Letter  [place] at [facility]  [address]   [place], [place] [address]   Phone: [phone]  Fax: [phone]      05/28/2010 MRN: [id number]  Nathaniel [name] [address] [place], [place]  [address]  [place]    Dear [name],      We have scheduled an appointment for you.  At the recommendation of Dr.YOO, we have scheduled you a consult with [facility] , DR [facility]  on APRIL  52,8413 at 10:30AM.  Their address is_[address] [place]  . The office phone number is [phone].  If this appointment day and time is not convenient for you, please feel free to call the office of the doctor you are being referred to at the number listed above and reschedule the appointment.     It is important for you to keep your scheduled appointments. We are here to make sure you are given good patient care.    Thank you,  Darral Dash Patient Care Coordinator  at [facility]

## 2010-06-01 NOTE — Miscellaneous (Signed)
Summary: Orders Update  Clinical Lists Changes  Orders: Added new Test order of T-Basic Metabolic Panel [phone]) - Signed Added new Test order of T-Lipid Profile [id number]) - Signed Added new Test order of T-AST/SGOT [phone]) - Signed Added new Test order of T-ALT/SGPT ([id number]) - Signed Added new Test order of T- Hemoglobin A1C ([id number]) - Signed

## 2010-06-01 NOTE — Progress Notes (Addendum)
Summary: refill-crestor  Phone Note Call from Patient Call back at Home Phone [phone]   Caller: Patient Call For: [name] Summary of Call: pt called stating that he wants a refill on crestor. I told him that he needs to call pharmacy but he said that the pharmacy has told him that they have already sent a request? please assist. Initial call taken by: Elba Barman,  May 28, 2010 4:34 PM    Prescriptions: CRESTOR 10 MG TABS (ROSUVASTATIN CALCIUM) one tablet by mouth two times per week  #30 x 0   Entered by:   Glendell Docker CMA   Authorized by:   [name]   Signed by:   Glendell Docker CMA on 05/28/2010   Method used:   Electron[facility] to        [facility]  [address] [phone][facility] (retail)       [address]       [place], [place]  [address]       Ph: [phone] or [phone]       Fax: [phone]   RxID:   [id number]   Appended Document: refill-crestor Received call from pharmacy requesting verification of Crestor strength and directions. Kim at [facility] states pt previous script was for 20mg  1/2 to 1 tablet daily.  Current record indicates Crestor 10mg  1 tablet twice a week. Attempted to reach pt and left detailed message on cell to call us and verify strength and directions. Advised Kim per record, 10mg  1-2 tablets weekly is documented in chart.  Appended Document: refill-crestor Left message on machine to return my call.  Appended Document: refill-crestor Pt returned my call and  verified that our records are correct;  he takes Crestor 10mg  1-2 tablets weekly.

## 2010-06-11 ENCOUNTER — Telehealth: Payer: Self-pay | Admitting: *Deleted

## 2010-06-11 NOTE — Telephone Encounter (Signed)
Blood test shows no improvement in diabetes.  LDL acceptable at 104.  Triglycerides still elevated at 209 - this reflects poor diabetes control.  LFTs normal

## 2010-06-11 NOTE — Telephone Encounter (Signed)
Call placed to patient at [phone], no answer. A detailed voice message was left informing patient per Dr Artist Pais instructions. Message was left for patient to call back if any additional questions.

## 2010-06-11 NOTE — Telephone Encounter (Signed)
Patient called and left voice message requesting the results from his blood work done last week

## 2010-06-22 ENCOUNTER — Telehealth: Payer: Self-pay | Admitting: Internal Medicine

## 2010-06-22 DIAGNOSIS — F329 Major depressive disorder, single episode, unspecified: Secondary | ICD-10-CM

## 2010-06-22 DIAGNOSIS — F3289 Other specified depressive episodes: Secondary | ICD-10-CM

## 2010-06-22 MED ORDER — SERTRALINE HCL 100 MG PO TABS: 100.0000 mg | ORAL_TABLET | Freq: Every day | ORAL | Status: DC

## 2010-06-22 NOTE — Telephone Encounter (Signed)
rx refill sent to pharmacy 

## 2010-06-22 NOTE — Telephone Encounter (Signed)
Refill- sertraline hcl 100mg  tablet. Take one tablet by mouth once daily. Qty 30. Last fill 3.1.12

## 2010-07-12 ENCOUNTER — Ambulatory Visit: Payer: BC Managed Care – PPO | Admitting: Internal Medicine

## 2010-07-28 ENCOUNTER — Encounter: Payer: Self-pay | Admitting: Internal Medicine

## 2011-01-30 ENCOUNTER — Emergency Department (INDEPENDENT_AMBULATORY_CARE_PROVIDER_SITE_OTHER)
Admission: EM | Admit: 2011-01-30 | Discharge: 2011-01-30 | Disposition: A | Discharge status: Home / Self Care | Payer: BC Managed Care – PPO | Source: Home / Self Care | Attending: Family Medicine | Admitting: Family Medicine

## 2011-01-30 ENCOUNTER — Encounter: Payer: Self-pay | Admitting: Emergency Medicine

## 2011-01-30 ENCOUNTER — Other Ambulatory Visit: Payer: Self-pay | Admitting: Family Medicine

## 2011-01-30 DIAGNOSIS — J029 Acute pharyngitis, unspecified: Secondary | ICD-10-CM

## 2011-01-30 DIAGNOSIS — R6889 Other general symptoms and signs: Secondary | ICD-10-CM

## 2011-01-30 DIAGNOSIS — J111 Influenza due to unidentified influenza virus with other respiratory manifestations: Secondary | ICD-10-CM

## 2011-01-30 LAB — POCT RAPID STREP A (OFFICE): Rapid Strep A Screen: NEGATIVE

## 2011-01-30 MED ORDER — BENZONATATE 200 MG PO CAPS: 200.0000 mg | ORAL_CAPSULE | Freq: Every day | ORAL | Status: AC

## 2011-01-30 MED ORDER — OSELTAMIVIR PHOSPHATE 75 MG PO CAPS: 75.0000 mg | ORAL_CAPSULE | Freq: Two times a day (BID) | ORAL | Status: AC

## 2011-01-30 NOTE — ED Notes (Signed)
Sore throat, chills, body aches, cough x 2 days, his 2 teens tested positive for Influenza Type A last week

## 2011-01-30 NOTE — ED Provider Notes (Signed)
History     CSN: [id number] Arrival date & time: 01/30/2011 11:25 AM   First MD Initiated Contact with Patient 01/30/11 1141      Chief Complaint  Patient presents with  . URI     HPI Comments: Patient developed flu-like symptoms two days ago.  He has two teens with documented influenza.  He has not had a flu shot because of a possible egg allergy.  Patient is a 45 y.o. male presenting with URI. The history is provided by the patient.  URI The primary symptoms include fatigue, headaches, sore throat, cough, myalgias and arthralgias. Primary symptoms do not include fever, ear pain, swollen glands, wheezing, abdominal pain, nausea, vomiting or rash. The current episode started 2 days ago. This is a new problem. The problem has been gradually worsening.  The onset of the illness is associated with exposure to sick contacts (He has two teen-aged children both diagnosed with influenza). Symptoms associated with the illness include chills, congestion and rhinorrhea. The illness is not associated with plugged ear sensation, facial pain or sinus pressure.    Past Medical History  Diagnosis Date  . Depression   . Diabetes mellitus   . Hyperlipidemia   . Hypogonadism male   . Headache     Past Surgical History  Procedure Date  . Tonsillectomy 1972  . Nasal sinus surgery 04/1991    Family History  Problem Relation Age of Onset  . Adopted: Yes  . Cancer Father 1    Prostate cancer    History  Substance Use Topics  . Smoking status: Never Smoker   . Smokeless tobacco: Never Used  . Alcohol Use: No      Review of Systems  Constitutional: Positive for chills and fatigue. Negative for fever.  HENT: Positive for congestion, sore throat and rhinorrhea. Negative for ear pain and sinus pressure.   Eyes: Negative.   Respiratory: Positive for cough. Negative for chest tightness and wheezing.   Cardiovascular: Negative.   Gastrointestinal: Negative for nausea, vomiting and  abdominal pain.  Genitourinary: Negative.   Musculoskeletal: Positive for myalgias and arthralgias.  Skin: Negative.  Negative for rash.  Neurological: Positive for headaches.  Hematological: Negative for adenopathy.    Allergies  Eggs or egg-derived products; Metoclopramide hcl; Pioglitazone; and Promethazine hcl  Home Medications   Current Outpatient Rx  Name Route Sig Dispense Refill  . BENZONATATE 200 MG PO CAPS Oral Take 1 capsule (200 mg total) by mouth at bedtime. 12 capsule 0  . GUAIFENESIN-CODEINE 100-10 MG/5ML PO SYRP Oral Take 5 mLs by mouth at bedtime as needed for cough. 88.72 mL 0  . INSULIN GLARGINE 100 UNIT/ML [place] SOLN Subcutaneous Inject 25 Units into the skin at bedtime.     . INSULIN LISPRO (HUMAN) 100 UNIT/ML [place] SOLN Subcutaneous Inject 5-10 Units into the skin 3 (three) times daily before meals.     Marland Kitchen METFORMIN HCL 1000 MG PO TABS Oral Take 1 tablet (1,000 mg total) by mouth 2 (two) times daily with a meal. 60 tablet 6  . OSELTAMIVIR PHOSPHATE 75 MG PO CAPS Oral Take 1 capsule (75 mg total) by mouth every 12 (twelve) hours. 10 capsule 0    BP 120/76  Pulse 89  Temp(Src) 98.5 F (36.9 C) (Oral)  Resp 12  Ht 6' (1.829 m)  Wt 196 lb (88.905 kg)  BMI 26.58 kg/m2  SpO2 96%  Physical Exam  Nursing note and vitals reviewed. Constitutional: He is oriented to person, place, and  time. He appears well-developed and well-nourished. No distress.  HENT:  Head: Normocephalic.  Right Ear: External ear normal.  Left Ear: External ear normal.  Nose: Nose normal.  Mouth/Throat: Oropharynx is clear and moist. No oropharyngeal exudate.  Eyes: Conjunctivae and EOM are normal. Pupils are equal, round, and reactive to light. Right eye exhibits no discharge. Left eye exhibits no discharge.  Neck: Normal range of motion. Neck supple.  Cardiovascular: Normal rate, regular rhythm and normal heart sounds.   Pulmonary/Chest: Effort normal and breath sounds normal. No stridor. No  respiratory distress. He has no wheezes. He has no rales. He exhibits no tenderness.  Abdominal: Soft. Bowel sounds are normal. He exhibits no distension. There is no tenderness.  Musculoskeletal: He exhibits no edema.  Lymphadenopathy:    He has no cervical adenopathy.  Neurological: He is alert and oriented to person, place, and time.  Skin: Skin is warm and dry. He is not diaphoretic.    ED Course  Procedures None   Labs Reviewed  POCT RAPID STREP A (OFFICE) Negative  STREP A DNA PROBE Pending      1. Influenza-like illness   2. Acute pharyngitis       MDM  With two teens at home who have documented influenza A, will begin Tamiflu. Patient has not had a flu shot (he believes he may be allergic to eggs) Begin expectorant/decongestant, topical decongestant, saline nasal spray and/or saline irrigation, and cough suppressant at bedtime.  Avoid antihistamines for now. Increase fluid intake, rest. Throat culture pending Followup with PCP if not improving 7 to 10 days.         Donna Christen, MD 01/31/11 [phone]

## 2011-01-31 ENCOUNTER — Telehealth: Payer: Self-pay | Admitting: Internal Medicine

## 2011-01-31 ENCOUNTER — Encounter: Payer: Self-pay | Admitting: Internal Medicine

## 2011-01-31 ENCOUNTER — Ambulatory Visit (INDEPENDENT_AMBULATORY_CARE_PROVIDER_SITE_OTHER): Payer: BC Managed Care – PPO | Admitting: Internal Medicine

## 2011-01-31 ENCOUNTER — Telehealth: Payer: Self-pay | Admitting: *Deleted

## 2011-01-31 VITALS — BP 100/60 | HR 122 | Temp 98.6°F | Resp 20 | Wt 190.0 lb

## 2011-01-31 DIAGNOSIS — J069 Acute upper respiratory infection, unspecified: Secondary | ICD-10-CM

## 2011-01-31 DIAGNOSIS — E119 Type 2 diabetes mellitus without complications: Secondary | ICD-10-CM

## 2011-01-31 DIAGNOSIS — IMO0001 Reserved for inherently not codable concepts without codable children: Secondary | ICD-10-CM

## 2011-01-31 DIAGNOSIS — E785 Hyperlipidemia, unspecified: Secondary | ICD-10-CM

## 2011-01-31 MED ORDER — GUAIFENESIN-CODEINE 100-10 MG/5ML PO SYRP: 5.0000 mL | ORAL_SOLUTION | Freq: Every evening | ORAL | Status: AC | PRN

## 2011-01-31 MED ORDER — METFORMIN HCL 1000 MG PO TABS: 1000.0000 mg | ORAL_TABLET | Freq: Two times a day (BID) | ORAL | Status: DC

## 2011-01-31 NOTE — Patient Instructions (Signed)
Please increase your lantus dose 3 units every 3 days until your morning fasting glucose is less than 130. Please schedule cbc, chem7, a1c, urine microalbumin 250.0 and lipid/lft 272.4 prior to next visit

## 2011-02-01 NOTE — Telephone Encounter (Signed)
Lab orders entered for December 2012. 

## 2011-02-02 DIAGNOSIS — J069 Acute upper respiratory infection, unspecified: Secondary | ICD-10-CM | POA: Insufficient documentation

## 2011-02-02 LAB — CULTURE, GROUP A STREP: Organism ID, Bacteria: NORMAL

## 2011-02-02 NOTE — Progress Notes (Signed)
  Subjective:    Patient ID: Nathaniel [name], male    DOB: Jun 09, 1965, 45 y.o.   MRN: [id number]  HPI Pt presents to clinic for evaluation of DM. States seen at [facility] yesterday for URI. Placed on tamiflu due to +flu exposure and prescribed cough medication.  States insurance will not cover janumet any longer. Reports fsbs 150-200 without hypoglycemia. Tolerates metformin without gi adverse effect. No other complaints.  Past Medical History  Diagnosis Date  . Depression   . Diabetes mellitus   . Hyperlipidemia   . Hypogonadism male   . Headache    Past Surgical History  Procedure Date  . Tonsillectomy 1972  . Nasal sinus surgery 04/1991    reports that he has never smoked. He has never used smokeless tobacco. He reports that he does not drink alcohol. His drug history not on file. family history includes Cancer (age of onset:65) in his father.  He is adopted. Allergies  Allergen Reactions  . Eggs Or Egg-Derived Products   . Metoclopramide Hcl     REACTION: Jittery, increased anxiety  . Pioglitazone     REACTION: headache  . Promethazine Hcl     REACTION: nausea \\T \ vomiting     Review of Systems  HENT: Positive for congestion and rhinorrhea.   Respiratory: Positive for cough.   Musculoskeletal: Positive for myalgias.       Objective:   Physical Exam  Constitutional: He appears well-developed and well-nourished.  HENT:  Head: Normocephalic and atraumatic.  Neurological: He is alert.  Skin: He is not diaphoretic.  Psychiatric: He has a normal mood and affect.          Assessment & Plan:

## 2011-02-02 NOTE — Assessment & Plan Note (Signed)
Suspected possible influenza due to exposure. Continue tamiflu and cough medication prn. Followup if no improvement or worsening.

## 2011-02-02 NOTE — Assessment & Plan Note (Signed)
DC janumet due to cost. Begin metformin 1000mg  bid. Monitor fsbs daily and schedule follow up with labs.

## 2011-02-03 ENCOUNTER — Telehealth: Payer: Self-pay | Admitting: *Deleted

## 2011-02-21 ENCOUNTER — Telehealth: Payer: Self-pay | Admitting: Internal Medicine

## 2011-02-21 DIAGNOSIS — E291 Testicular hypofunction: Secondary | ICD-10-CM

## 2011-02-21 DIAGNOSIS — IMO0001 Reserved for inherently not codable concepts without codable children: Secondary | ICD-10-CM

## 2011-02-21 DIAGNOSIS — E785 Hyperlipidemia, unspecified: Secondary | ICD-10-CM

## 2011-02-21 NOTE — Telephone Encounter (Signed)
Can Dr Rodena Medin check his testosterone levels when he comes for his lab work this week

## 2011-02-23 NOTE — Telephone Encounter (Signed)
Yes dx hypogonadism. That test is more accurate early morning

## 2011-02-23 NOTE — Telephone Encounter (Signed)
Patient is scheduled for follow up 03/02/2011. Is it okay to add a testosterone lab to blood work?

## 2011-02-23 NOTE — Telephone Encounter (Signed)
Call placed to patient he was informed of addition to lab for testing and advised to have drawn early am. Patient stated that he will be in on Thursday to have blood work done.

## 2011-02-24 LAB — CBC
HCT: 42.4 % (ref 39.0–52.0)
Hemoglobin: 14.1 g/dL (ref 13.0–17.0)
MCH: 28.9 pg (ref 26.0–34.0)
MCHC: 33.3 g/dL (ref 30.0–36.0)
MCV: 86.9 fL (ref 78.0–100.0)
Platelets: 211 10*3/uL (ref 150–400)
RBC: 4.88 MIL/uL (ref 4.22–5.81)
RDW: 12.6 % (ref 11.5–15.5)
WBC: 5.3 10*3/uL (ref 4.0–10.5)

## 2011-02-24 LAB — LIPID PANEL
Cholesterol: 178 mg/dL (ref 0–200)
HDL: 53 mg/dL (ref >39)
LDL Cholesterol: 108 mg/dL — ABNORMAL HIGH (ref 0–99)
Total CHOL/HDL Ratio: 3.4 Ratio
Triglycerides: 85 mg/dL (ref <150)
VLDL: 17 mg/dL (ref 0–40)

## 2011-02-24 LAB — HEMOGLOBIN A1C
Hgb A1c MFr Bld: 9.3 % — ABNORMAL HIGH (ref <5.7)
Mean Plasma Glucose: 220 mg/dL — ABNORMAL HIGH (ref <117)

## 2011-02-24 LAB — BASIC METABOLIC PANEL
BUN: 11 mg/dL (ref 6–23)
CO2: 29 mEq/L (ref 19–32)
Calcium: 9.2 mg/dL (ref 8.4–10.5)
Chloride: 100 mEq/L (ref 96–112)
Creat: 0.86 mg/dL (ref 0.50–1.35)
Glucose, Bld: 90 mg/dL (ref 70–99)
Potassium: 4.1 mEq/L (ref 3.5–5.3)
Sodium: 141 mEq/L (ref 135–145)

## 2011-02-24 LAB — HEPATIC FUNCTION PANEL
ALT: 18 U/L (ref 0–53)
AST: 19 U/L (ref 0–37)
Albumin: 4.2 g/dL (ref 3.5–5.2)
Alkaline Phosphatase: 52 U/L (ref 39–117)
Bilirubin, Direct: 0.2 mg/dL (ref 0.0–0.3)
Indirect Bilirubin: 0.6 mg/dL (ref 0.0–0.9)
Total Bilirubin: 0.8 mg/dL (ref 0.3–1.2)
Total Protein: 6.7 g/dL (ref 6.0–8.3)

## 2011-02-24 LAB — TESTOSTERONE: Testosterone: 447.44 ng/dL (ref 250–890)

## 2011-02-25 LAB — MICROALBUMIN / CREATININE URINE RATIO
Creatinine, Urine: 178.6 mg/dL
Microalb Creat Ratio: 2.8 mg/g (ref 0.0–30.0)
Microalb, Ur: 0.5 mg/dL (ref 0.00–1.89)

## 2011-03-02 ENCOUNTER — Encounter: Payer: Self-pay | Admitting: Internal Medicine

## 2011-03-02 ENCOUNTER — Ambulatory Visit (INDEPENDENT_AMBULATORY_CARE_PROVIDER_SITE_OTHER): Payer: BC Managed Care – PPO | Admitting: Internal Medicine

## 2011-03-02 ENCOUNTER — Telehealth: Payer: Self-pay | Admitting: Internal Medicine

## 2011-03-02 VITALS — BP 100/60 | HR 69 | Resp 18 | Wt 193.0 lb

## 2011-03-02 DIAGNOSIS — E119 Type 2 diabetes mellitus without complications: Secondary | ICD-10-CM

## 2011-03-02 DIAGNOSIS — Z23 Encounter for immunization: Secondary | ICD-10-CM

## 2011-03-02 DIAGNOSIS — E785 Hyperlipidemia, unspecified: Secondary | ICD-10-CM

## 2011-03-02 DIAGNOSIS — IMO0001 Reserved for inherently not codable concepts without codable children: Secondary | ICD-10-CM

## 2011-03-02 NOTE — Assessment & Plan Note (Signed)
Improving control based on fsbs. Continue current regimen and repeat chem7 and a1c prior to next visit

## 2011-03-02 NOTE — Patient Instructions (Signed)
Please schedule chem7, a1c 250.0 and lipid 272.4 prior to next visit 

## 2011-03-02 NOTE — Progress Notes (Signed)
  Subjective:    Patient ID: Nathaniel [name], male    DOB: 02/03/1966, 45 y.o.   MRN: [id number]  HPI Pt presents to clinic for followup of multiple medical problems.  Tolerating metformin without gi upset. Has increased lantus to 32 units qhs with improved fsbs. Recently am sugars ~100. No hypoglycemia. A1c 9.3. Diabetic eye exam scheduled and pending. Reviewed ldl mildly above goal. Previously dx'ed with hypogonadism by outside provider but recent total testosterone nl. Doesn't recall tetanus shot w/i 10 years. No other complaints.  Past Medical History  Diagnosis Date  . Depression   . Diabetes mellitus   . Hyperlipidemia   . Hypogonadism male   . Headache    Past Surgical History  Procedure Date  . Tonsillectomy 1972  . Nasal sinus surgery 04/1991    reports that he has never smoked. He has never used smokeless tobacco. He reports that he does not drink alcohol. His drug history not on file. family history includes Cancer (age of onset:65) in his father.  He is adopted. Allergies  Allergen Reactions  . Eggs Or Egg-Derived Products   . Metoclopramide Hcl     REACTION: Jittery, increased anxiety  . Pioglitazone     REACTION: headache  . Promethazine Hcl     REACTION: nausea \\T \ vomiting     Review of Systems see hpi     Objective:   Physical Exam  Physical Exam  Nursing note and vitals reviewed. Constitutional: Appears well-developed and well-nourished. No distress.  HENT:  Head: Normocephalic and atraumatic.  Right Ear: External ear normal.  Left Ear: External ear normal.  Eyes: Conjunctivae are normal. No scleral icterus.  Neck: Neck supple. Carotid bruit is not present.  Cardiovascular: Normal rate, regular rhythm and normal heart sounds.  Exam reveals no gallop and no friction rub.   No murmur heard. Pulmonary/Chest: Effort normal and breath sounds normal. No respiratory distress. He has no wheezes. no rales.  Lymphadenopathy:    He has no cervical adenopathy.    Neurological:Alert.  Skin: Skin is warm and dry. Not diaphoretic.  Psychiatric: Has a normal mood and affect.        Assessment & Plan:

## 2011-03-02 NOTE — Assessment & Plan Note (Signed)
Mildly suboptimal control. Recommend ldl goal <100. Focus on low fat diet and regular aerobic exercise. Obtain lipid profile prior to next visit

## 2011-04-09 ENCOUNTER — Emergency Department (INDEPENDENT_AMBULATORY_CARE_PROVIDER_SITE_OTHER)
Admission: EM | Admit: 2011-04-09 | Discharge: 2011-04-09 | Disposition: A | Discharge status: Home / Self Care | Payer: BC Managed Care – PPO | Source: Home / Self Care | Attending: Emergency Medicine | Admitting: Emergency Medicine

## 2011-04-09 ENCOUNTER — Encounter: Payer: Self-pay | Admitting: Emergency Medicine

## 2011-04-09 DIAGNOSIS — M545 Low back pain, unspecified: Secondary | ICD-10-CM

## 2011-04-09 DIAGNOSIS — M62830 Muscle spasm of back: Secondary | ICD-10-CM

## 2011-04-09 DIAGNOSIS — M5380 Other specified dorsopathies, site unspecified: Secondary | ICD-10-CM

## 2011-04-09 MED ORDER — TRAMADOL HCL 50 MG PO TABS: 50.0000 mg | ORAL_TABLET | Freq: Four times a day (QID) | ORAL | Status: AC | PRN

## 2011-04-09 MED ORDER — PREDNISONE 20 MG PO TABS: 20.0000 mg | ORAL_TABLET | Freq: Two times a day (BID) | ORAL | Status: DC

## 2011-04-09 MED ORDER — CYCLOBENZAPRINE HCL 10 MG PO TABS: 10.0000 mg | ORAL_TABLET | Freq: Three times a day (TID) | ORAL | Status: AC | PRN

## 2011-04-09 NOTE — ED Notes (Signed)
Back pain x 3 days after gym work out.

## 2011-04-09 NOTE — ED Provider Notes (Signed)
History     CSN: [id number]  Arrival date & time 04/09/11  1438   First MD Initiated Contact with Patient 04/09/11 1457      No chief complaint on file.   (Consider location/radiation/quality/duration/timing/severity/associated sxs/prior treatment) HPI The patient presents today with back pain. He was at the gym and doing leg presses and was doing 260 pounds and felt a pull in his lower back. This happened about 5 years ago with a similar sensation, an x-ray was done at that time that was negative. No other trauma or history of back problems. No radiation of pain. Location: Left lower back Timing: For 3 days Description: Tight spasm Worse with: With certain movements  Better with: Rest Trauma: no Bladder/bowel incontinence: no Weakness: no Fever/chills: no Night pain: no Unexplained weight loss: no Cancer/immunosuppression: no PMH of osteoporosis or chronic steroid use:  no   Past Medical History  Diagnosis Date  . Depression   . Diabetes mellitus   . Hyperlipidemia   . Hypogonadism male   . Headache     Past Surgical History  Procedure Date  . Tonsillectomy 1972  . Nasal sinus surgery 04/1991    Family History  Problem Relation Age of Onset  . Adopted: Yes  . Cancer Father 11    Prostate cancer    History  Substance Use Topics  . Smoking status: Never Smoker   . Smokeless tobacco: Never Used  . Alcohol Use: No      Review of Systems  Allergies  Eggs or egg-derived products; Metoclopramide hcl; Pioglitazone; and Promethazine hcl  Home Medications   Current Outpatient Rx  Name Route Sig Dispense Refill  . CITALOPRAM HYDROBROMIDE 20 MG PO TABS Oral Take 20 mg by mouth daily.      . CYCLOBENZAPRINE HCL 10 MG PO TABS Oral Take 1 tablet (10 mg total) by mouth 3 (three) times daily as needed for muscle spasms. 21 tablet 0  . INSULIN GLARGINE 100 UNIT/ML [place] SOLN Subcutaneous Inject 32 Units into the skin at bedtime.     . INSULIN LISPRO (HUMAN) 100  UNIT/ML [place] SOLN Subcutaneous Inject 5-10 Units into the skin 3 (three) times daily before meals.     Marland Kitchen METFORMIN HCL 1000 MG PO TABS Oral Take 1 tablet (1,000 mg total) by mouth 2 (two) times daily with a meal. 60 tablet 6  . PREDNISONE 20 MG PO TABS Oral Take 1 tablet (20 mg total) by mouth 2 (two) times daily. 6 tablet 0  . TRAMADOL HCL 50 MG PO TABS Oral Take 1 tablet (50 mg total) by mouth every 6 (six) hours as needed for pain. 24 tablet 0    There were no vitals taken for this visit.  Physical Exam  Nursing note and vitals reviewed. Constitutional: He is oriented to person, place, and time. He appears well-developed and well-nourished.  Non-toxic appearance. He does not have a sickly appearance. He does not appear ill. He appears distressed.  HENT:  Head: Normocephalic and atraumatic.  Eyes: No scleral icterus.  Neck: Neck supple.  Cardiovascular: Regular rhythm and normal heart sounds.   Pulmonary/Chest: Effort normal and breath sounds normal. No respiratory distress.  Musculoskeletal:       Lumbar back: He exhibits tenderness (left paraspinal), pain and spasm. He exhibits no bony tenderness.       Straight leg raise is negative bilaterally.  Neurological: He is alert and oriented to person, place, and time. He has normal strength and normal reflexes. No  sensory deficit.  Skin: Skin is warm and dry. No rash noted.  Psychiatric: He has a normal mood and affect. His speech is normal.    ED Course  Procedures (including critical care time)  Labs Reviewed - No data to display No results found.   1. Lumbar back pain   2. Back spasm       MDM   I feel is most likely a muscular strain. I am to hold off on x-rays since there is no bony tenderness and no radiation. Prescriptions given for a few days of prednisone as well as a muscle relaxant and an anti-inflammatory. Advised heating pad, and general rest with gentle range of motion, and gentle massage. She'll likely be feeling  better and days, but if not he will need to followup with a back or sports medicine specialist. At that time or if worsening in the meantime, x-rays may be appropriate.     Lily Kocher, MD 04/09/11 [phone]

## 2011-04-12 ENCOUNTER — Ambulatory Visit (INDEPENDENT_AMBULATORY_CARE_PROVIDER_SITE_OTHER): Payer: BC Managed Care – PPO | Admitting: Internal Medicine

## 2011-04-12 ENCOUNTER — Encounter: Payer: Self-pay | Admitting: Internal Medicine

## 2011-04-12 VITALS — BP 100/74 | HR 84 | Temp 97.9°F | Resp 18 | Wt 199.0 lb

## 2011-04-12 DIAGNOSIS — M549 Dorsalgia, unspecified: Secondary | ICD-10-CM

## 2011-04-12 DIAGNOSIS — R739 Hyperglycemia, unspecified: Secondary | ICD-10-CM

## 2011-04-12 DIAGNOSIS — R7309 Other abnormal glucose: Secondary | ICD-10-CM

## 2011-04-12 MED ORDER — DICLOFENAC SODIUM 75 MG PO TBEC: DELAYED_RELEASE_TABLET | ORAL | Status: AC

## 2011-04-16 DIAGNOSIS — R739 Hyperglycemia, unspecified: Secondary | ICD-10-CM | POA: Insufficient documentation

## 2011-04-16 DIAGNOSIS — M549 Dorsalgia, unspecified: Secondary | ICD-10-CM | POA: Insufficient documentation

## 2011-04-16 NOTE — Progress Notes (Signed)
  Subjective:    Patient ID: Nathaniel [name], male    DOB: [birth year], 46 y.o.   MRN: [id number]  HPI Pt presents to clinic for evaluation of back pain. Seen in UC over the weekend with left lower back pain without injury or radiating pain. No leg weakness or paresthesia. Given prednisone, tramadol and muscle relaxer. Since beginning medication fsbs 250-300. No hypoglycemia. Some sedation from flexeril. No other alleviating or exacerbating factors. No other complaints.  Past Medical History  Diagnosis Date  . Depression   . Diabetes mellitus   . Hyperlipidemia   . Hypogonadism male   . Headache    Past Surgical History  Procedure Date  . Tonsillectomy 1972  . Nasal sinus surgery 04/1991    reports that he has never smoked. He has never used smokeless tobacco. He reports that he does not drink alcohol. His drug history not on file. family history includes Cancer (age of onset:65) in his father.  He is adopted. Allergies  Allergen Reactions  . Eggs Or Egg-Derived Products   . Metoclopramide Hcl     REACTION: Jittery, increased anxiety  . Pioglitazone     REACTION: headache  . Promethazine Hcl     REACTION: nausea \\T \ vomiting     Review of Systems see hpi     Objective:   Physical Exam  Nursing note and vitals reviewed. Constitutional: He appears well-developed and well-nourished. No distress.  HENT:  Head: Normocephalic and atraumatic.  Musculoskeletal:       Modified slr negative. Bilateral 5/5 le strength. Gait nl  Neurological: He is alert.  Skin: He is not diaphoretic.  Psychiatric: He has a normal mood and affect.          Assessment & Plan:

## 2011-04-16 NOTE — Assessment & Plan Note (Signed)
Stop prednisone. Request fsbs report to be called to clinic

## 2011-04-16 NOTE — Assessment & Plan Note (Signed)
Stop prednisone and begin voltaren with food and no other nsaids. Currently defers PT. Obtain ls spine radiograph if no improvement. Followup if no improvement or worsening.

## 2011-05-26 ENCOUNTER — Telehealth: Payer: Self-pay | Admitting: *Deleted

## 2011-05-26 ENCOUNTER — Other Ambulatory Visit: Payer: Self-pay | Admitting: *Deleted

## 2011-05-26 DIAGNOSIS — IMO0001 Reserved for inherently not codable concepts without codable children: Secondary | ICD-10-CM

## 2011-05-26 DIAGNOSIS — E785 Hyperlipidemia, unspecified: Secondary | ICD-10-CM

## 2011-05-26 LAB — BASIC METABOLIC PANEL
BUN: 8 mg/dL (ref 6–23)
CO2: 29 mEq/L (ref 19–32)
Calcium: 9.5 mg/dL (ref 8.4–10.5)
Chloride: 100 mEq/L (ref 96–112)
Creat: 0.93 mg/dL (ref 0.50–1.35)
Glucose, Bld: 116 mg/dL — ABNORMAL HIGH (ref 70–99)
Potassium: 4.3 mEq/L (ref 3.5–5.3)
Sodium: 137 mEq/L (ref 135–145)

## 2011-05-26 LAB — LIPID PANEL
Cholesterol: 168 mg/dL (ref 0–200)
HDL: 58 mg/dL (ref >39)
LDL Cholesterol: 96 mg/dL (ref 0–99)
Total CHOL/HDL Ratio: 2.9 Ratio
Triglycerides: 70 mg/dL (ref <150)
VLDL: 14 mg/dL (ref 0–40)

## 2011-05-26 LAB — HEMOGLOBIN A1C
Hgb A1c MFr Bld: 8.6 % — ABNORMAL HIGH (ref <5.7)
Mean Plasma Glucose: 200 mg/dL — ABNORMAL HIGH (ref <117)

## 2011-05-26 MED ORDER — CITALOPRAM HYDROBROMIDE 20 MG PO TABS: 20.0000 mg | ORAL_TABLET | Freq: Every day | ORAL | Status: DC

## 2011-05-26 NOTE — Telephone Encounter (Signed)
Lab orders entered for a1c, bmp and lipid.

## 2011-05-26 NOTE — Telephone Encounter (Signed)
Patient presented for blood work today. There are no future orders seen. Please advise on blood work needed.

## 2011-05-26 NOTE — Telephone Encounter (Signed)
Patient called requesting a refill on Celexa. He stated that he is scheduled to folllow up next week ,but will run out the medication before his appointment. Rx refill sent to pharmacy.

## 2011-06-01 ENCOUNTER — Ambulatory Visit (INDEPENDENT_AMBULATORY_CARE_PROVIDER_SITE_OTHER): Payer: BC Managed Care – PPO | Admitting: Internal Medicine

## 2011-06-01 ENCOUNTER — Encounter: Payer: Self-pay | Admitting: Internal Medicine

## 2011-06-01 ENCOUNTER — Telehealth: Payer: Self-pay | Admitting: Internal Medicine

## 2011-06-01 VITALS — BP 100/70 | HR 66 | Resp 16 | Ht 72.0 in | Wt 197.0 lb

## 2011-06-01 DIAGNOSIS — IMO0001 Reserved for inherently not codable concepts without codable children: Secondary | ICD-10-CM

## 2011-06-01 MED ORDER — INSULIN LISPRO 100 UNIT/ML ~~LOC~~ SOLN: 10.0000 [IU] | Freq: Three times a day (TID) | SUBCUTANEOUS | Status: DC

## 2011-06-01 NOTE — Telephone Encounter (Signed)
Lab order entered for June 2013. 

## 2011-06-01 NOTE — Assessment & Plan Note (Signed)
Reviewed need for improved glycemic control and targets. Recommend continuation of current regimen but with definite focus on compliance both with medication and well as appropriate diabetic diet. Asked to call in fsbs report in a few weeks. Did discuss possible endocrinology consult if fails to improve

## 2011-06-01 NOTE — Progress Notes (Signed)
  Subjective:    Patient ID: Nathaniel [name], male    DOB: 1965/07/18, 46 y.o.   MRN: [id number]  HPI Pt presents to clinic for followup of multiple medical problems. Reviewed a1c improved but elevated 8.6. States typically sees fsbs upper 100's but complicated by other values of 70's and 80's. Had one episode of symptomatic hypoglycemia resolved with juice-post juice fsbs 80. States has developed a "sweet tooth" and admits to dietary indiscretion. Currently using lantus 27 units qhs. Usually skips breakfast lispro and will take 5-10 units with other meals but not regularly. Total time of visit ~27 minutes of which >50% spent in counseling.  Past Medical History  Diagnosis Date  . Depression   . Diabetes mellitus   . Hyperlipidemia   . Hypogonadism male   . Headache    Past Surgical History  Procedure Date  . Tonsillectomy 1972  . Nasal sinus surgery 04/1991    reports that he has never smoked. He has never used smokeless tobacco. He reports that he does not drink alcohol. His drug history not on file. family history includes Cancer (age of onset:65) in his father.  He is adopted. Allergies  Allergen Reactions  . Eggs Or Egg-Derived Products   . Metoclopramide Hcl     REACTION: Jittery, increased anxiety  . Pioglitazone     REACTION: headache  . Promethazine Hcl     REACTION: nausea \\T \ vomiting      Review of Systems see hpi     Objective:   Physical Exam  Physical Exam  Nursing note and vitals reviewed. Constitutional: Appears well-developed and well-nourished. No distress.  HENT:  Head: Normocephalic and atraumatic.  Right Ear: External ear normal.  Left Ear: External ear normal.  Eyes: Conjunctivae are normal. No scleral icterus.  Neck: Neck supple. Carotid bruit is not present.  Cardiovascular: Normal rate, regular rhythm and normal heart sounds.  Exam reveals no gallop and no friction rub.   No murmur heard. Pulmonary/Chest: Effort normal and breath sounds  normal. No respiratory distress. He has no wheezes. no rales.  Lymphadenopathy:    He has no cervical adenopathy.  Neurological:Alert.  Skin: Skin is warm and dry. Not diaphoretic.  Psychiatric: Has a normal mood and affect.        Assessment & Plan:

## 2011-06-01 NOTE — Patient Instructions (Signed)
Please schedule chem7, a1c 250.0 prior to next visit 

## 2011-06-08 ENCOUNTER — Emergency Department (INDEPENDENT_AMBULATORY_CARE_PROVIDER_SITE_OTHER)
Admission: EM | Admit: 2011-06-08 | Discharge: 2011-06-08 | Disposition: A | Discharge status: Home / Self Care | Payer: BC Managed Care – PPO | Source: Home / Self Care | Attending: Emergency Medicine | Admitting: Emergency Medicine

## 2011-06-08 ENCOUNTER — Encounter: Payer: Self-pay | Admitting: *Deleted

## 2011-06-08 DIAGNOSIS — J069 Acute upper respiratory infection, unspecified: Secondary | ICD-10-CM

## 2011-06-08 DIAGNOSIS — J329 Chronic sinusitis, unspecified: Secondary | ICD-10-CM

## 2011-06-08 MED ORDER — AMOXICILLIN 875 MG PO TABS: 875.0000 mg | ORAL_TABLET | Freq: Two times a day (BID) | ORAL | Status: AC

## 2011-06-08 MED ORDER — GUAIFENESIN-CODEINE 100-10 MG/5ML PO SYRP: 5.0000 mL | ORAL_SOLUTION | Freq: Four times a day (QID) | ORAL | Status: AC | PRN

## 2011-06-08 NOTE — ED Provider Notes (Signed)
History     CSN: [id number]  Arrival date & time 06/08/11  2952   First MD Initiated Contact with Patient 06/08/11 [phone]      Chief Complaint  Patient presents with  . Cough  . Nasal Congestion    (Consider location/radiation/quality/duration/timing/severity/associated sxs/prior treatment) HPI Nathaniel [name] is a 46 y.o. male who complains of onset of cold symptoms for 5-6 days. He has been taking NyQuil, ibuprofen, and a generic decongestant which is helping a little bit. He is diabetic and recently switched to a low carbohydrate diet and his blood sugar recently has been much better improved. + sore throat + cough No pleuritic pain No wheezing + nasal congestion + post-nasal drainage + sinus pain/pressure No chest congestion No itchy/red eyes No earache No hemoptysis No SOB No chills/sweats No fever No nausea No vomiting No abdominal pain No diarrhea No skin rashes No fatigue No myalgias + headache    Past Medical History  Diagnosis Date  . Depression   . Hyperlipidemia   . Hypogonadism male   . Headache   . Diabetes mellitus     type II    Past Surgical History  Procedure Date  . Tonsillectomy 1972  . Nasal sinus surgery 04/1991    Family History  Problem Relation Age of Onset  . Adopted: Yes  . Cancer Father 11    Prostate cancer    History  Substance Use Topics  . Smoking status: Never Smoker   . Smokeless tobacco: Never Used  . Alcohol Use: No      Review of Systems  All other systems reviewed and are negative.    Allergies  Eggs or egg-derived products; Metoclopramide hcl; Pioglitazone; and Promethazine hcl  Home Medications   Current Outpatient Rx  Name Route Sig Dispense Refill  . AMOXICILLIN 875 MG PO TABS Oral Take 1 tablet (875 mg total) by mouth 2 (two) times daily. 14 tablet 0  . CITALOPRAM HYDROBROMIDE 20 MG PO TABS Oral Take 1 tablet (20 mg total) by mouth daily. 30 tablet 0  . GUAIFENESIN-CODEINE 100-10 MG/5ML PO SYRP Oral  Take 5 mLs by mouth 4 (four) times daily as needed for cough or congestion. 120 mL 0  . INSULIN GLARGINE 100 UNIT/ML Fisher SOLN Subcutaneous Inject 28 Units into the skin at bedtime.     . INSULIN LISPRO (HUMAN) 100 UNIT/ML Evant SOLN Subcutaneous Inject 10 Units into the skin 3 (three) times daily before meals. 10 mL 6  . METFORMIN HCL 1000 MG PO TABS Oral Take 1 tablet (1,000 mg total) by mouth 2 (two) times daily with a meal. 60 tablet 6    BP 107/72  Pulse 85  Temp(Src) 99 F (37.2 C) (Oral)  Resp 16  Ht 6' (1.829 m)  Wt 196 lb (88.905 kg)  BMI 26.58 kg/m2  SpO2 96%  Physical Exam  Nursing note and vitals reviewed. Constitutional: He is oriented to person, place, and time. He appears well-developed and well-nourished.  HENT:  Head: Normocephalic and atraumatic.  Right Ear: Tympanic membrane, external ear and ear canal normal.  Left Ear: Tympanic membrane, external ear and ear canal normal.  Nose: Mucosal edema and rhinorrhea present.  Mouth/Throat: Posterior oropharyngeal erythema present. No oropharyngeal exudate or posterior oropharyngeal edema.  Eyes: No scleral icterus.  Neck: Neck supple.  Cardiovascular: Regular rhythm and normal heart sounds.   Pulmonary/Chest: Effort normal and breath sounds normal. No respiratory distress.  Neurological: He is alert and oriented to person, place, and time.  Skin: Skin is warm and dry.  Psychiatric: He has a normal mood and affect. His speech is normal.    ED Course  Procedures (including critical care time)  Labs Reviewed - No data to display No results found.   1. Acute upper respiratory infections of unspecified site   2. Sinusitis       MDM  1)  Take the prescribed antibiotic as instructed. 2)  Use nasal saline solution (over the counter) at least 3 times a day. 3)  Use over the counter decongestants like Zyrtec-D every 12 hours as needed to help with congestion.  If you have hypertension, do not take medicines with  sudafed.  4)  Can take tylenol every 6 hours or motrin every 8 hours for pain or fever. 5)  Follow up with your primary doctor if no improvement in 5-7 days, sooner if increasing pain, fever, or new symptoms.     Since he has recently started the Atkins diet I. advised him to assure that he hydrate with water to make sure that changes that he is going through flushed out and it that in itself may make him feel better. He should also continue to monitor his blood sugars.  Marlaine Hind, MD 06/08/11 [phone]

## 2011-06-08 NOTE — ED Notes (Signed)
Patient c/o dry cough, runny nose, congestion, HA and scratchy throat x 5 days. Denies fever. Taken Nyquil, IBF, and generic decongestant.

## 2011-06-22 ENCOUNTER — Telehealth: Payer: Self-pay | Admitting: *Deleted

## 2011-06-22 NOTE — Telephone Encounter (Signed)
If that's what he prefers can do but offer appt.

## 2011-06-22 NOTE — Telephone Encounter (Signed)
Patient called and left voice message requesting a referral to GI for abdominal pain that he is having after eating.

## 2011-06-22 NOTE — Telephone Encounter (Signed)
Call placed to patient at [phone], He was advised to schedule office visit for evaluation. Patient has scheduled for Thursday 06/23/2011 @ 8:30 am with Dr Rodena Medin.

## 2011-06-23 ENCOUNTER — Ambulatory Visit (INDEPENDENT_AMBULATORY_CARE_PROVIDER_SITE_OTHER): Payer: BC Managed Care – PPO | Admitting: Internal Medicine

## 2011-06-23 ENCOUNTER — Encounter: Payer: Self-pay | Admitting: Internal Medicine

## 2011-06-23 VITALS — BP 100/66 | HR 56 | Resp 18 | Ht 72.0 in | Wt 196.0 lb

## 2011-06-23 DIAGNOSIS — IMO0001 Reserved for inherently not codable concepts without codable children: Secondary | ICD-10-CM

## 2011-06-23 DIAGNOSIS — R1013 Epigastric pain: Secondary | ICD-10-CM

## 2011-06-23 NOTE — Assessment & Plan Note (Signed)
Improving control. Encouraged in diet/exercise efforts.

## 2011-06-23 NOTE — Assessment & Plan Note (Signed)
Suspect gastritis/acid related etiology. Attempt nexium 40mg  po qd. Samples given for 3wks. Notify clinic at end of samples status report. If no improvement proceed with further evaluation including abd [place] and labs.

## 2011-06-23 NOTE — Progress Notes (Signed)
  Subjective:    Patient ID: Nathaniel [name], male    DOB: [birth year], 46 y.o.   MRN: [id number]  HPI Pt presents to clinic for evaluation of abdominal pain. Notes several week h/o burning sensation in epigastric area after eating. No radiation of pain to the back. Has associated nausea and emesis on a few occasions. No hematemesis. Attempted pepcid with some improvement. No other alleviating or exacerbating factors. Also notes improving glycemic control after beginning low carb diet. Had to reduce lantus dosing due to hypoglycemia. Registered for a 10k run.  Past Medical History  Diagnosis Date  . Depression   . Hyperlipidemia   . Hypogonadism male   . Headache   . Diabetes mellitus     type II   Past Surgical History  Procedure Date  . Tonsillectomy 1972  . Nasal sinus surgery 04/1991    reports that he has never smoked. He has never used smokeless tobacco. He reports that he does not drink alcohol or use illicit drugs. family history includes Cancer (age of onset:65) in his father.  He is adopted. Allergies  Allergen Reactions  . Eggs Or Egg-Derived Products   . Metoclopramide Hcl     REACTION: Jittery, increased anxiety  . Pioglitazone     REACTION: headache  . Promethazine Hcl     REACTION: nausea \\T \ vomiting       Review of Systems see hpi     Objective:   Physical Exam  Nursing note and vitals reviewed. Constitutional: He appears well-developed and well-nourished. No distress.  HENT:  Head: Normocephalic and atraumatic.  Eyes: Conjunctivae are normal. No scleral icterus.  Abdominal: Soft. Normal appearance and bowel sounds are normal. He exhibits no distension and no mass. There is no hepatosplenomegaly. There is no tenderness. There is no rebound and no guarding.  Neurological: He is alert.  Skin: He is not diaphoretic.  Psychiatric: He has a normal mood and affect.          Assessment & Plan:

## 2011-06-27 ENCOUNTER — Other Ambulatory Visit: Payer: Self-pay | Admitting: Internal Medicine

## 2011-06-28 NOTE — Telephone Encounter (Signed)
Rx refill sent to pharmacy. 

## 2011-07-11 ENCOUNTER — Other Ambulatory Visit: Payer: Self-pay | Admitting: *Deleted

## 2011-07-11 NOTE — Telephone Encounter (Signed)
Patient called and left voice message requesting a refill request on Lantus. He stated in his message he was given samples of Nexium for his stomach, and he wanted Dr Rodena Medin to know that was effective for him. He is requesting a Rx to [facility].

## 2011-07-11 NOTE — Telephone Encounter (Signed)
Omeprazole 40mg  po qd #30 rf 11 may be more cost effective

## 2011-07-12 MED ORDER — OMEPRAZOLE 40 MG PO CPDR: 40.0000 mg | DELAYED_RELEASE_CAPSULE | Freq: Every day | ORAL | Status: DC

## 2011-07-12 MED ORDER — INSULIN GLARGINE 100 UNIT/ML ~~LOC~~ SOLN: 18.0000 [IU] | Freq: Every day | SUBCUTANEOUS | Status: DC

## 2011-07-12 NOTE — Telephone Encounter (Signed)
Rx refill sent to pharmacy. Call placed to patient at [phone], no answer. A detailed voice message was left informing patient Rx sent to pharmacy.

## 2011-07-19 ENCOUNTER — Other Ambulatory Visit: Payer: Self-pay | Admitting: Internal Medicine

## 2011-07-19 DIAGNOSIS — IMO0001 Reserved for inherently not codable concepts without codable children: Secondary | ICD-10-CM

## 2011-07-19 MED ORDER — GLUCOSE BLOOD VI STRP: ORAL_STRIP | Status: DC

## 2011-07-19 MED ORDER — ONETOUCH ULTRA 2 W/DEVICE KIT: PACK | Status: DC

## 2011-07-19 MED ORDER — ONETOUCH ULTRASOFT LANCETS MISC: Status: DC

## 2011-07-19 NOTE — Telephone Encounter (Signed)
Patient would like one touch ultra test strips to be called in to [facility] in [place]. Patient states that it has been years since he has used test strips?

## 2011-07-19 NOTE — Telephone Encounter (Signed)
Diabetic testing supplies sent to pharmacy

## 2011-07-26 ENCOUNTER — Encounter: Payer: Self-pay | Admitting: Internal Medicine

## 2011-07-26 ENCOUNTER — Ambulatory Visit (INDEPENDENT_AMBULATORY_CARE_PROVIDER_SITE_OTHER): Payer: BC Managed Care – PPO | Admitting: Internal Medicine

## 2011-07-26 VITALS — BP 100/78 | HR 70 | Temp 98.0°F | Resp 14 | Wt 195.8 lb

## 2011-07-26 DIAGNOSIS — R590 Localized enlarged lymph nodes: Secondary | ICD-10-CM

## 2011-07-26 DIAGNOSIS — R599 Enlarged lymph nodes, unspecified: Secondary | ICD-10-CM

## 2011-07-26 MED ORDER — AMOXICILLIN-POT CLAVULANATE 875-125 MG PO TABS: 1.0000 | ORAL_TABLET | Freq: Two times a day (BID) | ORAL | Status: AC

## 2011-07-27 DIAGNOSIS — R590 Localized enlarged lymph nodes: Secondary | ICD-10-CM | POA: Insufficient documentation

## 2011-07-27 NOTE — Progress Notes (Signed)
  Subjective:    Patient ID: Nathaniel [name], male    DOB: Apr 04, 1965, 46 y.o.   MRN: [id number]  HPI Pt presents to clinic for evaluation of axillary mass. Notes one week h/o right axillary soft tissue mass. Area is nontender and not increasing in size. No other masses or regional adenopathy noted. Denies recent infxn, fever, sweats or weight loss. No alleviating or exacerbating factors. No other complaints.   Past Medical History  Diagnosis Date  . Depression   . Hyperlipidemia   . Hypogonadism male   . Headache   . Diabetes mellitus     type II   Past Surgical History  Procedure Date  . Tonsillectomy 1972  . Nasal sinus surgery 04/1991    reports that he has never smoked. He has never used smokeless tobacco. He reports that he does not drink alcohol or use illicit drugs. family history includes Cancer (age of onset:65) in his father.  He is adopted. Allergies  Allergen Reactions  . Eggs Or Egg-Derived Products   . Metoclopramide Hcl     REACTION: Jittery, increased anxiety  . Pioglitazone     REACTION: headache  . Promethazine Hcl     REACTION: nausea \\T \ vomiting     Review of Systems see hpi     Objective:   Physical Exam  Nursing note and vitals reviewed. Constitutional: He appears well-developed and well-nourished. No distress.  HENT:  Head: Normocephalic and atraumatic.  Eyes: Conjunctivae are normal. No scleral icterus.  Neck: Neck supple.  Lymphadenopathy:       Right axilla-ST mass ~2cm. NT, mobile, smooth and well circumscribed.   Neurological: He is alert.  Skin: Skin is warm and dry. He is not diaphoretic.          Assessment & Plan:

## 2011-07-27 NOTE — Assessment & Plan Note (Signed)
Given acute onset and location suspect adenopathy. No recent infxs sxs. No constitutional sx's. Begin empiric abx course with augmentin. Has close f/u scheduled in several weeks and will re-evaluate at that time.

## 2011-08-15 ENCOUNTER — Other Ambulatory Visit: Payer: Self-pay | Admitting: Internal Medicine

## 2011-08-16 ENCOUNTER — Telehealth: Payer: Self-pay | Admitting: Family

## 2011-08-16 MED ORDER — INSULIN LISPRO 100 UNIT/ML ~~LOC~~ SOLN: SUBCUTANEOUS | Status: DC

## 2011-08-16 NOTE — Telephone Encounter (Signed)
Pt called, requesting humalog pen instead of vial be sent to pharmacy.  Rx has been sent.

## 2011-08-16 NOTE — Telephone Encounter (Signed)
Rx refill sent to pharmacy. 

## 2011-08-29 ENCOUNTER — Other Ambulatory Visit: Payer: Self-pay | Admitting: *Deleted

## 2011-08-29 DIAGNOSIS — IMO0001 Reserved for inherently not codable concepts without codable children: Secondary | ICD-10-CM

## 2011-08-29 LAB — BASIC METABOLIC PANEL
BUN: 9 mg/dL (ref 6–23)
CO2: 31 mEq/L (ref 19–32)
Calcium: 9 mg/dL (ref 8.4–10.5)
Chloride: 102 mEq/L (ref 96–112)
Creat: 0.95 mg/dL (ref 0.50–1.35)
Glucose, Bld: 102 mg/dL — ABNORMAL HIGH (ref 70–99)
Potassium: 4.3 mEq/L (ref 3.5–5.3)
Sodium: 141 mEq/L (ref 135–145)

## 2011-08-30 ENCOUNTER — Ambulatory Visit: Payer: BC Managed Care – PPO | Admitting: Internal Medicine

## 2011-08-30 LAB — HEMOGLOBIN A1C
Hgb A1c MFr Bld: 7.6 % — ABNORMAL HIGH (ref <5.7)
Mean Plasma Glucose: 171 mg/dL — ABNORMAL HIGH (ref <117)

## 2011-09-01 ENCOUNTER — Ambulatory Visit (INDEPENDENT_AMBULATORY_CARE_PROVIDER_SITE_OTHER): Payer: BC Managed Care – PPO | Admitting: Internal Medicine

## 2011-09-01 ENCOUNTER — Telehealth: Payer: Self-pay | Admitting: Internal Medicine

## 2011-09-01 ENCOUNTER — Encounter: Payer: Self-pay | Admitting: Internal Medicine

## 2011-09-01 VITALS — BP 100/60 | HR 60 | Temp 97.5°F | Resp 18 | Ht 71.0 in | Wt 196.0 lb

## 2011-09-01 DIAGNOSIS — IMO0001 Reserved for inherently not codable concepts without codable children: Secondary | ICD-10-CM

## 2011-09-01 DIAGNOSIS — R51 Headache: Secondary | ICD-10-CM

## 2011-09-01 MED ORDER — FLUTICASONE PROPIONATE 50 MCG/ACT NA SUSP: 2.0000 | Freq: Every day | NASAL | Status: DC

## 2011-09-01 NOTE — Patient Instructions (Signed)
Please schedule labs prior to next visit Chem7, a1c 250 

## 2011-09-01 NOTE — Telephone Encounter (Signed)
Lab orders entered

## 2011-09-01 NOTE — Telephone Encounter (Signed)
Please schedule labs prior to next visit  Chem7, a1c-250   Patient has upcoming appointment on 12/06/11. He will be going to [facility] lab.

## 2011-09-01 NOTE — Progress Notes (Signed)
  Subjective:    Patient ID: Nathaniel [name], male    DOB: [birth year], 46 y.o.   MRN: [id number]  HPI Pt presents to clinic for followup of multiple medical problems. Last 2 weeks notes daily headaches without associated neurologic sx's. Feels like sinus related with frontal sinus pressure. Previously noted right axillary adenopathy resolved after 3 weeks. FSBS 30d avg 140.  Past Medical History  Diagnosis Date  . Depression   . Hyperlipidemia   . Hypogonadism male   . Headache   . Diabetes mellitus     type II   Past Surgical History  Procedure Date  . Tonsillectomy 1972  . Nasal sinus surgery 04/1991    reports that he has never smoked. He has never used smokeless tobacco. He reports that he does not drink alcohol or use illicit drugs. family history includes Cancer (age of onset:65) in his father.  He is adopted. Allergies  Allergen Reactions  . Eggs Or Egg-Derived Products   . Metoclopramide Hcl     REACTION: Jittery, increased anxiety  . Pioglitazone     REACTION: headache  . Promethazine Hcl     REACTION: nausea \\T \ vomiting      Review of Systems see hpi     Objective:   Physical Exam  Physical Exam  Nursing note and vitals reviewed. Constitutional: Appears well-developed and well-nourished. No distress.  HENT:  Head: Normocephalic and atraumatic. Mild frontal sinus tenderness Right Ear: External ear normal.  Left Ear: External ear normal.  Eyes: Conjunctivae are normal. No scleral icterus.  Neck: Neck supple. Carotid bruit is not present.  Cardiovascular: Normal rate, regular rhythm and normal heart sounds.  Exam reveals no gallop and no friction rub.   No murmur heard. Pulmonary/Chest: Effort normal and breath sounds normal. No respiratory distress. He has no wheezes. no rales.  Lymphadenopathy:    He has no cervical adenopathy.  Neurological:Alert.  Skin: Skin is warm and dry. Not diaphoretic.  Psychiatric: Has a normal mood and affect.          Assessment & Plan:

## 2011-09-07 NOTE — Assessment & Plan Note (Signed)
Possible relationship to frontal sinus. Add flonase qd. Followup if no improvement or worsening.

## 2011-09-07 NOTE — Assessment & Plan Note (Signed)
Improving a1c and reports recent am fsbs 80-90. Continue current regimen

## 2011-09-19 ENCOUNTER — Telehealth: Payer: Self-pay | Admitting: *Deleted

## 2011-09-19 NOTE — Telephone Encounter (Signed)
Patient states that he has been having H/As for [2] weeks & taking the sinus medication, as discussed, without relief; requesting new advice on what to do moving forward with care for this issue/SLS Please advise.

## 2011-09-20 ENCOUNTER — Telehealth: Payer: Self-pay | Admitting: Family Medicine

## 2011-09-20 MED ORDER — AMOXICILLIN-POT CLAVULANATE 875-125 MG PO TABS: 1.0000 | ORAL_TABLET | Freq: Two times a day (BID) | ORAL | Status: AC

## 2011-09-20 NOTE — Telephone Encounter (Signed)
Recent visit suggested possible sinus involvement. Continue flonase. Take course of abx- augmentin 875mg  bid x 10d. Needs f/u if sx's persist

## 2011-09-20 NOTE — Telephone Encounter (Signed)
Rx sent to pharmacy; [facility] w/contact name & number to inform patient of MD instructions/SLS

## 2011-09-20 NOTE — Telephone Encounter (Signed)
Pt left a voice message and requested a referral to see a allergy doctor.

## 2011-09-20 NOTE — Telephone Encounter (Signed)
Without having tried flonase and antibiotics can't really tell if headache is truly sinus related. Can refer to allergy but if they believe is sinus related they are likely to give similar recommendations. Offer appt here

## 2011-09-21 ENCOUNTER — Other Ambulatory Visit: Payer: Self-pay | Admitting: Internal Medicine

## 2011-09-21 DIAGNOSIS — J310 Chronic rhinitis: Secondary | ICD-10-CM

## 2011-09-21 NOTE — Telephone Encounter (Signed)
Referral order placed.

## 2011-09-21 NOTE — Telephone Encounter (Signed)
I spoke with pt and he stated that he did not try either medication. He has had allergy problems since he was 46 years old. He does want the referral.

## 2011-09-23 NOTE — Telephone Encounter (Signed)
LMOM with contact name & number to explain the referral process after receiving phone message from patient not understanding referral process & timeline, as he was wanting to know why he had not heard back since Wednesday, 07.03.13 request [with 07.04.13 being a Holiday]/SLS

## 2011-09-28 ENCOUNTER — Encounter: Payer: Self-pay | Admitting: Internal Medicine

## 2011-09-28 ENCOUNTER — Ambulatory Visit (INDEPENDENT_AMBULATORY_CARE_PROVIDER_SITE_OTHER): Payer: BC Managed Care – PPO | Admitting: Internal Medicine

## 2011-09-28 VITALS — BP 100/66 | HR 77 | Wt 195.0 lb

## 2011-09-28 DIAGNOSIS — J3089 Other allergic rhinitis: Secondary | ICD-10-CM | POA: Insufficient documentation

## 2011-09-28 DIAGNOSIS — J302 Other seasonal allergic rhinitis: Secondary | ICD-10-CM

## 2011-09-28 DIAGNOSIS — J310 Chronic rhinitis: Secondary | ICD-10-CM

## 2011-09-28 DIAGNOSIS — G8929 Other chronic pain: Secondary | ICD-10-CM | POA: Insufficient documentation

## 2011-09-28 DIAGNOSIS — R51 Headache: Secondary | ICD-10-CM

## 2011-09-28 HISTORY — DX: Other chronic pain: G89.29

## 2011-09-28 HISTORY — DX: Other seasonal allergic rhinitis: J30.2

## 2011-09-28 NOTE — Assessment & Plan Note (Signed)
Allergy consult

## 2011-09-28 NOTE — Assessment & Plan Note (Signed)
Avoid empiric tx. Proceed with neurology consult

## 2011-09-28 NOTE — Progress Notes (Signed)
  Subjective:    Patient ID: Nathaniel [name], male    DOB: 1965-08-30, 46 y.o.   MRN: [id number]  HPI Pt presents to clinic for follow up of headaches. Seen 6/13 with 2wk h/o qd headaches. Some suspicion at that time for possible sinus etiology given frontal sinus location and tenderness. Prescribed flonase-did not take. States his wife who is a Engineer, civil (consulting) informed him it would cause significant hyperglycemia and as a nasal spray would be addictive. Called back 7/1 stating no better and course of augmentin was advised. Did not take the antibiotic. Called 7/2 requesting allergist referral. Today notes chronic rhinitis sx's dating back to childhood. S/p allergy testing and shots remotely. Takes claritin prn without much improvement. Also notes other location for discomfort including right parietal/occipital without associated neck pain. Taking motrin without improvement.   Past Medical History  Diagnosis Date  . Depression   . Hyperlipidemia   . Hypogonadism male   . Headache   . Diabetes mellitus     type II   Past Surgical History  Procedure Date  . Tonsillectomy 1972  . Nasal sinus surgery 04/1991    reports that he has never smoked. He has never used smokeless tobacco. He reports that he does not drink alcohol or use illicit drugs. family history includes Cancer (age of onset:65) in his father.  He is adopted. Allergies  Allergen Reactions  . Eggs Or Egg-Derived Products   . Metoclopramide Hcl     REACTION: Jittery, increased anxiety  . Pioglitazone     REACTION: headache  . Promethazine Hcl     REACTION: nausea \\T \ vomiting     Review of Systems see hpi     Objective:   Physical Exam  Nursing note and vitals reviewed. Constitutional: He appears well-developed and well-nourished. No distress.  Neurological: He is alert.  Skin: He is not diaphoretic.          Assessment & Plan:

## 2011-11-01 ENCOUNTER — Other Ambulatory Visit: Payer: BC Managed Care – PPO

## 2011-11-01 ENCOUNTER — Encounter: Payer: Self-pay | Admitting: Internal Medicine

## 2011-11-01 ENCOUNTER — Ambulatory Visit (INDEPENDENT_AMBULATORY_CARE_PROVIDER_SITE_OTHER): Payer: BC Managed Care – PPO | Admitting: Internal Medicine

## 2011-11-01 VITALS — BP 110/80 | HR 79 | Ht 71.0 in | Wt 197.6 lb

## 2011-11-01 DIAGNOSIS — J309 Allergic rhinitis, unspecified: Secondary | ICD-10-CM

## 2011-11-01 DIAGNOSIS — J302 Other seasonal allergic rhinitis: Secondary | ICD-10-CM

## 2011-11-01 MED ORDER — MONTELUKAST SODIUM 10 MG PO TABS: 10.0000 mg | ORAL_TABLET | Freq: Every day | ORAL | Status: DC

## 2011-11-01 NOTE — Patient Instructions (Addendum)
Consider the dust allergy environmental precaution information I gave you.  Consider trying otc antihistamine Allegra/ fexofenadine as an alternative to Claritin/ loratadine  Ok to continue flonase  Order- lab- allergy profile   Dx allergic rhinitis  Script to try Singulair/ montelukast. This is not an antihistamine and can be combined with any of these other meds. It may help to try taking singulair off and on, a week at a time, to decide if it helps enough to stick with.

## 2011-11-01 NOTE — Progress Notes (Signed)
11/01/11- 46 yoM never smoker seen at kind request of Dr Rodena Medin for allergy evaluation. Long history of allergic rhinitis prior to sinus surgery in 1993. Was on allergy vaccine. Did worse in [place] where behind pollen was heavy, while growing up. Nasal congestion, post nasal drip and sneezing are worse in spring and fall. He expects two episodes of sinusitis per year. Denies history of asthma. Fair control using daily Flonase and Claritin year around. Occasional Benadryl after mowing lawns. Tobacco smoke is a recognized irritant. Some history of allergic rash in the past. Denies problems with aspirin, food, insect stings. Environment-house, no basement, no mildew, no smokers. They do have indoor cat. 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  Prior to Admission medications   Medication Sig Start Date End Date Taking? Authorizing Provider  Blood Glucose Monitoring Suppl (ONE TOUCH ULTRA 2) W/DEVICE KIT Use as instructed to check blood sugar three times daily  Dx 250.02 07/19/11  Yes Edwyna Perfect, MD  citalopram (CELEXA) 20 MG tablet take 1 tablet by mouth once daily 06/27/11  Yes Edwyna Perfect, MD  fluticasone (FLONASE) 50 MCG/ACT nasal spray Place 2 sprays into the nose daily. 09/01/11 08/31/12 Yes Edwyna Perfect, MD  glucose blood (ONE TOUCH ULTRA TEST) test strip Use as instructed to check blood sugar three times daily  Dx 250.02 07/19/11  Yes Edwyna Perfect, MD  HUMALOG KWIKPEN 100 UNIT/ML injection inject 5 to 10 units with meals 08/15/11  Yes Edwyna Perfect, MD  insulin glargine (LANTUS) 100 UNIT/ML injection Inject 22 Units into the skin at bedtime. 07/11/11  Yes Edwyna Perfect, MD  Lancets [facility] ULTRASOFT) lancets Use as instructed to check blood sugar three times daily  Dx 250.02 07/19/11  Yes Edwyna Perfect, MD  loratadine (CLARITIN) 10 MG tablet Take 10 mg by mouth daily.   Yes Historical Provider, MD  metFORMIN (GLUCOPHAGE) 1000 MG  tablet Take 1 tablet (1,000 mg total) by mouth 2 (two) times daily with a meal. 01/31/11 01/31/12 Yes Edwyna Perfect, MD  omeprazole (PRILOSEC) 40 MG capsule Take 1 capsule (40 mg total) by mouth daily. 07/12/11 07/11/12 Yes Edwyna Perfect, MD  montelukast (SINGULAIR) 10 MG tablet Take 1 tablet (10 mg total) by mouth at bedtime. 11/01/11 10/31/12  [name], MD   pmh Past Surgical History  Procedure Date  . Tonsillectomy 1972  . Nasal sinus surgery 04/1991   Family History  Problem Relation Age of Onset  . Adopted: Yes  . Cancer Father 18    Prostate cancer   History   Social History  . Marital Status: Married    Spouse Name: Asencion Islam    Number of Children: 2  . Years of Education: N/A   Occupational History  .      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 Smoking status: Never Smoker   . Smokeless tobacco: Never Used  . Alcohol Use: No  . Drug Use: No  . Sexually Active: Not on file   Other Topics Concern  . Not on file   Social History Narrative  . No narrative on file   ROS-see HPI Constitutional:   No-   weight loss, night sweats, fevers, chills, fatigue, lassitude. HEENT:   +  headaches, No-difficulty swallowing, tooth/dental problems, sore throat,       +  sneezing, itching, ear ache, nasal congestion, post nasal drip,  CV:  No-   chest pain, orthopnea, PND, swelling in lower extremities, anasarca, dizziness, palpitations Resp: No-   shortness of breath with exertion or at rest.              No-   productive cough,  No non-productive cough,  No- coughing up of blood.              No-   change in color of mucus.  No- wheezing.   Skin: No-   rash or lesions. GI:  No-   heartburn, indigestion, abdominal pain, nausea, vomiting, diarrhea,                 change in bowel habits, loss of appetite GU: No-   dysuria, change in color of urine, no urgency or frequency.  No- flank pain. MS:  No-   joint pain or swelling.  No- decreased range of  motion.  No- back pain. Neuro-     nothing unusual Psych:  No- change in mood or affect. No depression or anxiety.  No memory loss.  OBJ- Physical Exam General- Alert, Oriented, Affect-appropriate, Distress- none acute Skin- rash-none, lesions- none, excoriation- none Lymphadenopathy- none Head- atraumatic            Eyes- Gross vision intact, PERRLA, conjunctivae and secretions clear            Ears- Hearing, canals-normal            Nose- + mucus bridging, +Septal dev,  polyps, erosion, perforation. + Persistent sniffing            Throat- Mallampati II-III , mucosa clear , drainage- none, tonsils- atrophic Neck- flexible , trachea midline, no stridor , thyroid nl, carotid no bruit Chest - symmetrical excursion , unlabored           Heart/CV- RRR , no murmur , no gallop  , no rub, nl s1 s2                           - JVD- none , edema- none, stasis changes- none, varices- none           Lung- clear to P&A, wheeze- none, cough- none , dullness-none, rub- none           Chest wall-  Abd- tender-no, distended-no, bowel sounds-present, HSM- no Br/ Gen/ Rectal- Not done, not indicated Extrem- cyanosis- none, clubbing, none, atrophy- none, strength- nl Neuro- grossly intact to observation

## 2011-11-03 LAB — ALLERGY FULL PROFILE
Allergen, D pternoyssinus,d7: <0.1 kU/L
Allergen,Goose feathers, e70: <0.1 kU/L
Alternaria Alternata: <0.1 kU/L
Aspergillus fumigatus, IgG: <0.1 kU/L
Bahia Grass: 1.71 kU/L — ABNORMAL HIGH
Bermuda Grass: 0.71 kU/L — ABNORMAL HIGH
Box Elder IgE: <0.1 kU/L
Candida Albicans: <0.1 kU/L
Cat Dander: 0.32 kU/L — ABNORMAL HIGH
Common Ragweed: <0.1 kU/L
Curvularia lunata: <0.1 kU/L
D. farinae: <0.1 kU/L
Dog Dander: <0.1 kU/L
Elm IgE: <0.1 kU/L
Fescue: 1.93 kU/L — ABNORMAL HIGH
G005 Rye, Perennial: 2.14 kU/L — ABNORMAL HIGH
G009 Red Top: 2.42 kU/L — ABNORMAL HIGH
Goldenrod: <0.1 kU/L
Helminthosporium halodes: <0.1 kU/L
House Dust Hollister: <0.1 kU/L
IgE (Immunoglobulin E), Serum: 21.7 IU/mL (ref 0.0–180.0)
Lamb's Quarters: <0.1 kU/L
Oak: <0.1 kU/L
Plantain: <0.1 kU/L
Stemphylium Botryosum: <0.1 kU/L
Sycamore Tree: <0.1 kU/L
Timothy Grass: 1.85 kU/L — ABNORMAL HIGH

## 2011-11-08 NOTE — Assessment & Plan Note (Signed)
Plan-allergy profile. Repair of Allegra to Claritin while continuing Flonase. Singulair. Environmental dust and allergen instructions.

## 2011-12-06 ENCOUNTER — Ambulatory Visit: Payer: BC Managed Care – PPO | Admitting: Internal Medicine

## 2011-12-19 ENCOUNTER — Ambulatory Visit: Payer: BC Managed Care – PPO | Admitting: Internal Medicine

## 2012-02-03 ENCOUNTER — Other Ambulatory Visit: Payer: Self-pay | Admitting: Internal Medicine

## 2012-02-13 ENCOUNTER — Telehealth: Payer: Self-pay | Admitting: Internal Medicine

## 2012-02-13 NOTE — Telephone Encounter (Signed)
Patient denied Appt offered for Tuesday, 11.26.13 [work], scheduled for OV Friday, 11.29.13 @ 1:30pm; f/u from [facility]: GI, nausea & vomiting/SLS

## 2012-02-13 NOTE — Telephone Encounter (Signed)
Patient states that he was seen in the ED this morning regarding stomach pains. He says that the physician at the ED gave him some medication to help with the nausea but he says that medication is not helping. He wants to be seen ASAP. Should patient come in this soon?

## 2012-02-17 ENCOUNTER — Encounter: Payer: Self-pay | Admitting: Internal Medicine

## 2012-02-17 ENCOUNTER — Ambulatory Visit (INDEPENDENT_AMBULATORY_CARE_PROVIDER_SITE_OTHER): Payer: BC Managed Care – PPO | Admitting: Internal Medicine

## 2012-02-17 VITALS — BP 104/78 | HR 85 | Temp 97.5°F | Resp 16 | Wt 199.8 lb

## 2012-02-17 DIAGNOSIS — IMO0001 Reserved for inherently not codable concepts without codable children: Secondary | ICD-10-CM

## 2012-02-17 DIAGNOSIS — E119 Type 2 diabetes mellitus without complications: Secondary | ICD-10-CM

## 2012-02-17 DIAGNOSIS — R51 Headache: Secondary | ICD-10-CM

## 2012-02-17 DIAGNOSIS — G8929 Other chronic pain: Secondary | ICD-10-CM

## 2012-02-17 LAB — BASIC METABOLIC PANEL
BUN: 13 mg/dL (ref 6–23)
CO2: 30 mEq/L (ref 19–32)
Calcium: 9.8 mg/dL (ref 8.4–10.5)
Chloride: 97 mEq/L (ref 96–112)
Creat: 1.08 mg/dL (ref 0.50–1.35)
Glucose, Bld: 249 mg/dL — ABNORMAL HIGH (ref 70–99)
Potassium: 5.1 mEq/L (ref 3.5–5.3)
Sodium: 134 mEq/L — ABNORMAL LOW (ref 135–145)

## 2012-02-17 LAB — HEMOGLOBIN A1C
Hgb A1c MFr Bld: 9.7 % — ABNORMAL HIGH (ref <5.7)
Mean Plasma Glucose: 232 mg/dL — ABNORMAL HIGH (ref <117)

## 2012-02-17 MED ORDER — TRAMADOL HCL 50 MG PO TABS: 50.0000 mg | ORAL_TABLET | Freq: Three times a day (TID) | ORAL | Status: DC | PRN

## 2012-02-17 MED ORDER — GLUCAGON (RDNA) 1 MG IJ KIT: 1.0000 mg | PACK | Freq: Once | INTRAMUSCULAR | Status: DC | PRN

## 2012-02-17 MED ORDER — ISOMETHEPTENE-APAP-DICHLORAL 65-325-100 MG PO CAPS: 1.0000 | ORAL_CAPSULE | Freq: Four times a day (QID) | ORAL | Status: DC | PRN

## 2012-02-17 NOTE — Assessment & Plan Note (Signed)
Attempt midrin prn. Reschedule neurology consult

## 2012-02-17 NOTE — Progress Notes (Signed)
  Subjective:    Patient ID: Nathaniel [name], male    DOB: [birth year], 46 y.o.   MRN: [id number]  HPI Pt presents to clinic for ED follow up of hypoglycemia and n/v.  States awoke 5d ago with hypoglycemia quantified at 54. Noted nausea/vomitting, esophageal pain with swallowing but denied abdominal pain or hematemesis. His wife administered a glucagon injection and called ems. At the ED fsbs reportedly 170 (no records available.) underwent unspecified lab evaluation, observation and received zofran. States n/v resolved with no further episodes. Also no further hypoglycemia.   States morning fasting glucose recently low 100's however after lunch/mid afternoon glucose high 250+. Feels stress has affected his glycemic control but school ending will help. Appears to have cancelled last follow up appt.   Continues to suffer from chronic intermittent headaches. Previously referred to neurology however did not keep appointment. Pt feels they are stress related and requests medication for prn use besides nsaid.   Past Medical History  Diagnosis Date  . Depression   . Hyperlipidemia   . Hypogonadism male   . Headache   . Diabetes mellitus     type II   Past Surgical History  Procedure Date  . Tonsillectomy 1972  . Nasal sinus surgery 04/1991    reports that he has never smoked. He has never used smokeless tobacco. He reports that he does not drink alcohol or use illicit drugs. family history includes Cancer (age of onset:65) in his father.  He is adopted. Allergies  Allergen Reactions  . Eggs Or Egg-Derived Products   . Metoclopramide Hcl     REACTION: Jittery, increased anxiety  . Pioglitazone     REACTION: headache  . Promethazine Hcl     REACTION: nausea \\T \ vomiting     Review of Systems see hpi     Objective:   Physical Exam  Nursing note and vitals reviewed. Constitutional: He appears well-developed and well-nourished. No distress.  HENT:  Head: Normocephalic and atraumatic.    Eyes: Conjunctivae normal are normal. No scleral icterus.  Neck: Neck supple.  Abdominal: Soft. Normal appearance and bowel sounds are normal. He exhibits no distension and no mass. There is no hepatosplenomegaly. There is no tenderness. There is no rebound and no guarding.  Neurological: He is alert.  Skin: He is not diaphoretic.  Psychiatric: He has a normal mood and affect.          Assessment & Plan:

## 2012-02-17 NOTE — Assessment & Plan Note (Signed)
Obtain chem7 and a1c. Glucagon kit prescription provided. Recommend slow mild increase of lunch humalog injection ~2 units every 4-5 days until late afternoon glucose level improves to low 100's without hypoglycemia.

## 2012-02-22 ENCOUNTER — Other Ambulatory Visit: Payer: Self-pay | Admitting: Internal Medicine

## 2012-03-06 ENCOUNTER — Encounter: Payer: Self-pay | Admitting: *Deleted

## 2012-04-17 ENCOUNTER — Ambulatory Visit (INDEPENDENT_AMBULATORY_CARE_PROVIDER_SITE_OTHER): Payer: BC Managed Care – PPO | Admitting: Family Medicine

## 2012-04-17 ENCOUNTER — Encounter: Payer: Self-pay | Admitting: Family Medicine

## 2012-04-17 ENCOUNTER — Ambulatory Visit: Payer: BC Managed Care – PPO | Admitting: Family Medicine

## 2012-04-17 VITALS — BP 117/82 | HR 72 | Temp 97.6°F | Resp 16 | Wt 194.0 lb

## 2012-04-17 DIAGNOSIS — G8929 Other chronic pain: Secondary | ICD-10-CM

## 2012-04-17 DIAGNOSIS — R51 Headache: Secondary | ICD-10-CM

## 2012-04-17 DIAGNOSIS — IMO0001 Reserved for inherently not codable concepts without codable children: Secondary | ICD-10-CM

## 2012-04-17 NOTE — Assessment & Plan Note (Signed)
Improved on topamax 50 qAM and 75mg  qhs. He will keep f/u with his neurologist.

## 2012-04-17 NOTE — Assessment & Plan Note (Signed)
Sounds like control is improving. Due to wanting his visit expedited today, he declined HbA1c today. Also discussed vaccines: he reports egg allergy and therefore he won't take the flu vaccine.  He also declines pneumovax b/c he is skeptical about whether or not his insurance will cover it.

## 2012-04-17 NOTE — Progress Notes (Signed)
OFFICE NOTE  04/17/2012  CC:  Chief Complaint  Patient presents with  . Follow-up    2 month  . Diabetes  . was referred to [facility], Mediction given for      HPI: Patient is a 47 y.o. Caucasian male who is here for 2 month f/u DM 2.  He is a patient of Dr. Rodena Medin at [facility] office but has chosen to transfer to me since Dr. Rodena Medin is no longer at that office. Patient a bit upset at having to wait to be seen today.  "I'm just sitting her thinking about all the things I have to get done today".  Was having some issues with hypoglycemia last visit, also some high postprandial excursions, was instructed to slowly titrate his humalog up at mealtime.   Fastings 90s  Sometimes takes only 500mg  of metformin hs to avoid hypoglyc.  Has been exercising a lot for the last month or so.  Usually gives 5-6 U humalog each meal.  Occ postprandial check 140-160, occ 240-260. Overall he feels like his control is better than last visit.  Has chronic HA's.  Neuro consult resulted in MRI brain (normal): Dr. Alton Revere with [facility] neurology. Topamax 25mg , 2 qAM and 3 qhs.  He occ takes alleve cold/sinus but otherwise no HA abortive med. He says stress has a lot to do with his headaches.  He has f/u arranged with his neurologist.   Pertinent PMH:  Past Medical History  Diagnosis Date  . Depression   . Hyperlipidemia   . Hypogonadism male   . Headache   . Diabetes mellitus     type II    MEDS: **Pt not taking tramadol or midrin listed below. Outpatient Prescriptions Prior to Visit  Medication Sig Dispense Refill  . Blood Glucose Monitoring Suppl (ONE TOUCH ULTRA 2) W/DEVICE KIT Use as instructed to check blood sugar three times daily  Dx 250.02  1 each  0  . citalopram (CELEXA) 20 MG tablet TAKE 1 TABLET BY MOUTH ONCE DAILY  30 tablet  2  . fluticasone (FLONASE) 50 MCG/ACT nasal spray Place 2 sprays into the nose daily.  1 g  11  . glucagon (GLUCAGON EMERGENCY) 1 MG injection  Inject 1 mg into the muscle once as needed.  1 each  3  . glucose blood (ONE TOUCH ULTRA TEST) test strip Use as instructed to check blood sugar three times daily  Dx 250.02  300 each  6  . HUMALOG KWIKPEN 100 UNIT/ML injection inject 5 to 10 units with meals  15 mL  3  . insulin glargine (LANTUS) 100 UNIT/ML injection Inject 27 Units into the skin at bedtime.       . Lancets (ONETOUCH ULTRASOFT) lancets Use as instructed to check blood sugar three times daily  Dx 250.02  300 each  6  . loratadine (CLARITIN) 10 MG tablet Take 10 mg by mouth daily.      . metFORMIN (GLUCOPHAGE) 1000 MG tablet TAKE 1 TABLET BY MOUTH TWICE DAILY WITH FOOD  60 tablet  2  . omeprazole (PRILOSEC) 40 MG capsule Take 1 capsule (40 mg total) by mouth daily.  30 capsule  11  . isometheptene-acetaminophen-dichloralphenazone (MIDRIN) 65-325-100 MG capsule Take 1 capsule by mouth 4 (four) times daily as needed.  30 capsule  1  . traMADol (ULTRAM) 50 MG tablet Take 1 tablet (50 mg total) by mouth every 8 (eight) hours as needed for pain.  30 tablet  0  Last reviewed on 04/17/2012 10:57 AM by Nathaniel Massed, MD  PE: Blood pressure 117/82, pulse 72, temperature 97.6 F (36.4 C), temperature source Temporal, resp. rate 16, weight 194 lb (87.998 kg), SpO2 97.00%. Gen: Alert, well appearing.  Patient is oriented to person, place, time, and situation. No further exam today.  IMPRESSION AND PLAN:  Transfer pt  DIABETES MELLITUS, TYPE II, UNCONTROLLED Sounds like control is improving. Due to wanting his visit expedited today, he declined HbA1c today. Also discussed vaccines: he reports egg allergy and therefore he won't take the flu vaccine.  He also declines pneumovax b/c he is skeptical about whether or not his insurance will cover it.  Chronic headaches Improved on topamax 50 qAM and 75mg  qhs. He will keep f/u with his neurologist.   An After Visit Summary was printed and given to the patient.  FOLLOW UP: 3-4  mo

## 2012-05-01 ENCOUNTER — Encounter: Payer: Self-pay | Admitting: *Deleted

## 2012-05-01 ENCOUNTER — Emergency Department (INDEPENDENT_AMBULATORY_CARE_PROVIDER_SITE_OTHER)
Admission: EM | Admit: 2012-05-01 | Discharge: 2012-05-01 | Disposition: A | Discharge status: Home / Self Care | Payer: BC Managed Care – PPO | Source: Home / Self Care | Attending: Family Medicine | Admitting: Family Medicine

## 2012-05-01 DIAGNOSIS — J309 Allergic rhinitis, unspecified: Secondary | ICD-10-CM

## 2012-05-01 DIAGNOSIS — J069 Acute upper respiratory infection, unspecified: Secondary | ICD-10-CM

## 2012-05-01 HISTORY — DX: Gastro-esophageal reflux disease without esophagitis: K21.9

## 2012-05-01 MED ORDER — AZITHROMYCIN 250 MG PO TABS: ORAL_TABLET | ORAL | Status: DC

## 2012-05-01 MED ORDER — BENZONATATE 100 MG PO CAPS: 100.0000 mg | ORAL_CAPSULE | Freq: Three times a day (TID) | ORAL | Status: DC | PRN

## 2012-05-01 MED ORDER — FLUTICASONE PROPIONATE 50 MCG/ACT NA SUSP: 2.0000 | Freq: Every day | NASAL | Status: DC

## 2012-05-01 NOTE — ED Provider Notes (Signed)
History     CSN: [id number]  Arrival date & time 05/01/12  1752   First MD Initiated Contact with Patient 05/01/12 1759      Chief Complaint  Patient presents with  . Cough  . Nasal Congestion   HPI  URI Symptoms Onset: 2 weeks  Description: rhinorrhea, nasal congestion, cough  Modifying factors:  Baseline diabetic. Blood sugars have been well controlled at home-in the low 100s .  Symptoms Nasal discharge: yes Fever: no Sore throat: no Cough: yes Wheezing: no Ear pain: no GI symptoms: no Sick contacts: yes  Red Flags  Stiff neck: no Dyspnea: no Rash: no Swallowing difficulty: no  Sinusitis Risk Factors Headache/face pain: no Double sickening: no tooth pain: no  Allergy Risk Factors Sneezing: yes Itchy scratchy throat: yes Seasonal symptoms: yes  Flu Risk Factors Headache: no muscle aches: no severe fatigue: no   Past Medical History  Diagnosis Date  . Depression   . Hyperlipidemia   . Hypogonadism male   . Headache   . Diabetes mellitus     type II  . GERD (gastroesophageal reflux disease)     Past Surgical History  Procedure Laterality Date  . Tonsillectomy  1972  . Nasal sinus surgery  04/1991    Family History  Problem Relation Age of Onset  . Adopted: Yes  . Cancer Father 19    Prostate cancer    History  Substance Use Topics  . Smoking status: Never Smoker   . Smokeless tobacco: Never Used  . Alcohol Use: No      Review of Systems  All other systems reviewed and are negative.    Allergies  Eggs or egg-derived products; Metoclopramide hcl; Pioglitazone; Promethazine hcl; and Reglan  Home Medications   Current Outpatient Rx  Name  Route  Sig  Dispense  Refill  . lansoprazole (PREVACID) 30 MG capsule   Oral   Take 30 mg by mouth daily.         Marland Kitchen azithromycin (ZITHROMAX) 250 MG tablet      Take 2 tabs PO x 1 dose, then 1 tab PO QD x 4 days   6 tablet   0   . benzonatate (TESSALON PERLES) 100 MG capsule  Oral   Take 1 capsule (100 mg total) by mouth 3 (three) times daily as needed for cough.   20 capsule   0   . Blood Glucose Monitoring Suppl (ONE TOUCH ULTRA 2) W/DEVICE KIT      Use as instructed to check blood sugar three times daily  Dx 250.02   1 each   0   . citalopram (CELEXA) 20 MG tablet      TAKE 1 TABLET BY MOUTH ONCE DAILY   30 tablet   2   . fluticasone (FLONASE) 50 MCG/ACT nasal spray   Nasal   Place 2 sprays into the nose daily.   1 g   11   . fluticasone (FLONASE) 50 MCG/ACT nasal spray   Nasal   Place 2 sprays into the nose daily.   16 g   12   . glucagon (GLUCAGON EMERGENCY) 1 MG injection   Intramuscular   Inject 1 mg into the muscle once as needed.   1 each   3   . glucose blood (ONE TOUCH ULTRA TEST) test strip      Use as instructed to check blood sugar three times daily  Dx 250.02   300 each   6   .  HUMALOG KWIKPEN 100 UNIT/ML injection      inject 5 to 10 units with meals   15 mL   3   . insulin glargine (LANTUS) 100 UNIT/ML injection   Subcutaneous   Inject 27 Units into the skin at bedtime.          Marland Kitchen isometheptene-acetaminophen-dichloralphenazone (MIDRIN) 65-325-100 MG capsule   Oral   Take 1 capsule by mouth 4 (four) times daily as needed.   30 capsule   1   . Lancets (ONETOUCH ULTRASOFT) lancets      Use as instructed to check blood sugar three times daily  Dx 250.02   300 each   6   . loratadine (CLARITIN) 10 MG tablet   Oral   Take 10 mg by mouth daily.         . metFORMIN (GLUCOPHAGE) 1000 MG tablet      TAKE 1 TABLET BY MOUTH TWICE DAILY WITH FOOD   60 tablet   2   . nortriptyline (PAMELOR) 10 MG capsule      Per neuro         . omeprazole (PRILOSEC) 40 MG capsule   Oral   Take 1 capsule (40 mg total) by mouth daily.   30 capsule   11   . topiramate (TOPAMAX) 25 MG tablet   Oral   Take 25 mg by mouth. Take 2 in the AM and 3 tablets at PM           BP 104/66  Pulse 75  Temp(Src) 97.9 F  (36.6 C) (Oral)  Ht 5\' 11"  (1.803 m)  Wt 193 lb (87.544 kg)  BMI 26.93 kg/m2  SpO2 98%  Physical Exam  Constitutional: He appears well-developed and well-nourished.  HENT:  Head: Normocephalic and atraumatic.  Right Ear: External ear normal.  +nasal erythema, rhinorrhea bilaterally, + post oropharyngeal erythema    Eyes: Conjunctivae are normal. Pupils are equal, round, and reactive to light.  Neck: Normal range of motion. Neck supple.  Cardiovascular: Normal rate, regular rhythm and normal heart sounds.   Pulmonary/Chest: Effort normal and breath sounds normal.  Abdominal: Soft.  Musculoskeletal: Normal range of motion.  Neurological: He is alert.  Skin: Skin is warm.    ED Course  Procedures (including critical care time)  Labs Reviewed - No data to display No results found.   1. URI (upper respiratory infection)   2. Allergic rhinitis       MDM  Will start on zpak for atypical coverage.  Flonase for allergic rhinitis.  Tessalon perles for cough.  Discussed infectious and resp red flags at length. Follow up with PCP in 5-7 days if not improved.     The patient and/or caregiver has been counseled thoroughly with regard to treatment plan and/or medications prescribed including dosage, schedule, interactions, rationale for use, and possible side effects and they verbalize understanding. Diagnoses and expected course of recovery discussed and will return if not improved as expected or if the condition worsens. Patient and/or caregiver verbalized understanding.             Doree Albee, MD 05/01/12 [phone]

## 2012-05-01 NOTE — ED Notes (Signed)
Pt c/o nonproductive cough, runny nose and abd pain from coughing x 2wks. He has taken OTC decongestants, cough meds and antihistamines with no relief. Denies fever.

## 2012-05-03 ENCOUNTER — Other Ambulatory Visit: Payer: Self-pay | Admitting: Internal Medicine

## 2012-06-15 ENCOUNTER — Encounter: Payer: Self-pay | Admitting: Family Medicine

## 2012-08-02 ENCOUNTER — Other Ambulatory Visit: Payer: Self-pay | Admitting: Internal Medicine

## 2012-08-02 NOTE — Telephone Encounter (Signed)
Rx sent in to pharmacy. 

## 2012-09-06 ENCOUNTER — Other Ambulatory Visit: Payer: Self-pay | Admitting: Internal Medicine

## 2012-09-27 ENCOUNTER — Other Ambulatory Visit: Payer: Self-pay

## 2012-12-12 ENCOUNTER — Emergency Department (INDEPENDENT_AMBULATORY_CARE_PROVIDER_SITE_OTHER): Payer: 59

## 2012-12-12 ENCOUNTER — Encounter: Payer: Self-pay | Admitting: *Deleted

## 2012-12-12 ENCOUNTER — Emergency Department
Admission: EM | Admit: 2012-12-12 | Discharge: 2012-12-12 | Disposition: A | Discharge status: Home / Self Care | Payer: 59 | Source: Home / Self Care | Attending: Family Medicine | Admitting: Family Medicine

## 2012-12-12 DIAGNOSIS — M25571 Pain in right ankle and joints of right foot: Secondary | ICD-10-CM

## 2012-12-12 DIAGNOSIS — M25579 Pain in unspecified ankle and joints of unspecified foot: Secondary | ICD-10-CM

## 2012-12-12 NOTE — ED Notes (Signed)
Nathaniel [name] reports rolling his right ankle 4 weeks ago that still not healed. No previous injury. Pain with walking.

## 2012-12-12 NOTE — ED Provider Notes (Signed)
CSN: [id number]     Arrival date & time 12/12/12  1147 History   First MD Initiated Contact with Patient 12/12/12 1158     Chief Complaint  Patient presents with  . Ankle Pain    HPI  Ankle pain x 1 month. Pt rolled his ankle approximately 1 month ago.  Has had persistent lateral ankle pain since this point.  Has been using NSAIDs intermittently  Baseline diabetic.  Last A1C was around 8 per pt.  On BID lantus for blood sugar control.  No numbness or burning.   Past Medical History  Diagnosis Date  . Depression   . Hyperlipidemia   . Hypogonadism male   . Headache(784.0)   . Diabetes mellitus     type II  . GERD (gastroesophageal reflux disease)    Past Surgical History  Procedure Laterality Date  . Tonsillectomy  1972  . Nasal sinus surgery  04/1991   Family History  Problem Relation Age of Onset  . Adopted: Yes  . Cancer Father 87    Prostate cancer   History  Substance Use Topics  . Smoking status: Never Smoker   . Smokeless tobacco: Never Used  . Alcohol Use: No    Review of Systems  All other systems reviewed and are negative.    Allergies  Eggs or egg-derived products; Metoclopramide hcl; Pioglitazone; Promethazine hcl; and Reglan  Home Medications   Current Outpatient Rx  Name  Route  Sig  Dispense  Refill  . Blood Glucose Monitoring Suppl (ONE TOUCH ULTRA 2) W/DEVICE KIT      Use as instructed to check blood sugar three times daily  Dx 250.02   1 each   0   . citalopram (CELEXA) 20 MG tablet      TAKE 1 TABLET BY MOUTH ONCE DAILY   30 tablet   2   . fluticasone (FLONASE) 50 MCG/ACT nasal spray   Nasal   Place 2 sprays into the nose daily.   16 g   12   . glucagon (GLUCAGON EMERGENCY) 1 MG injection   Intramuscular   Inject 1 mg into the muscle once as needed.   1 each   3   . glucose blood (ONE TOUCH ULTRA TEST) test strip      Use as instructed to check blood sugar three times daily  Dx 250.02   300 each   6   . HUMALOG  KWIKPEN 100 UNIT/ML injection      inject 5 to 10 units with meals   15 mL   3   . Insulin Glargine (LANTUS SOLOSTAR) 100 UNIT/ML SOPN      Inject 18 Units into the skin at bedtime.   3 mL   3   . insulin glargine (LANTUS) 100 UNIT/ML injection   Subcutaneous   Inject 27 Units into the skin at bedtime.          Marland Kitchen isometheptene-acetaminophen-dichloralphenazone (MIDRIN) 65-325-100 MG capsule   Oral   Take 1 capsule by mouth 4 (four) times daily as needed.   30 capsule   1   . Lancets (ONETOUCH ULTRASOFT) lancets      Use as instructed to check blood sugar three times daily  Dx 250.02   300 each   6   . lansoprazole (PREVACID) 30 MG capsule   Oral   Take 30 mg by mouth daily.         Marland Kitchen loratadine (CLARITIN) 10 MG tablet   Oral  Take 10 mg by mouth daily.         . metFORMIN (GLUCOPHAGE) 1000 MG tablet      TAKE 1 TABLET BY MOUTH TWICE DAILY WITH FOOD   60 tablet   2   . nortriptyline (PAMELOR) 10 MG capsule      Per neuro         . omeprazole (PRILOSEC) 40 MG capsule   Oral   Take 1 capsule (40 mg total) by mouth daily.   30 capsule   5   . topiramate (TOPAMAX) 25 MG tablet   Oral   Take 25 mg by mouth. Take 2 in the AM and 3 tablets at PM          BP 109/74  Pulse 84  Resp 14  Ht 5\' 11"  (1.803 m)  Wt 188 lb (85.276 kg)  BMI 26.23 kg/m2  SpO2 99% Physical Exam  Constitutional: He appears well-developed and well-nourished.  HENT:  Head: Normocephalic and atraumatic.  Eyes: Conjunctivae are normal. Pupils are equal, round, and reactive to light.  Neck: Normal range of motion. Neck supple.  Cardiovascular: Normal rate and regular rhythm.   Pulmonary/Chest: Effort normal and breath sounds normal.  Abdominal: Soft.  Musculoskeletal:       Feet:  + TTP over affected area  Full ROM  + pain with resisted ankle inversion.  Neurovascularly intact distally    Neurological: He is alert.  Skin: Skin is warm.    ED Course  Procedures  (including critical care time) Labs Review Labs Reviewed - No data to display Imaging Review Dg Ankle Complete Right  12/12/2012   CLINICAL DATA:  Turned ankle several weeks ago with pain  EXAM: RIGHT ANKLE - COMPLETE 3+ VIEW  COMPARISON:  None.  FINDINGS: No acute fracture is seen. However there is a well corticated bony density inferior to the tip of the lateral malleolus which may represent an old avulsion fracture fragment. The ankle joint appears normal, and all alignment is normal.  IMPRESSION: No acute fracture. Probable old avulsion from the tip of the right distal fibula.   Electronically Signed   By: Dwyane Dee M.D.   On: 12/12/2012 12:55    MDM   1. Pain in joint, ankle and foot, right    Will brace. Noted old avulsion fracture which may have been initial nidus of injury.   RICE and NSAIDs  Plan for follow up with sports medicine in 1-2 weeks if sxs persist despite treatment.     The patient and/or caregiver has been counseled thoroughly with regard to treatment plan and/or medications prescribed including dosage, schedule, interactions, rationale for use, and possible side effects and they verbalize understanding. Diagnoses and expected course of recovery discussed and will return if not improved as expected or if the condition worsens. Patient and/or caregiver verbalized understanding.          Doree Albee, MD 12/12/12 [phone]

## 2012-12-19 ENCOUNTER — Encounter: Payer: Self-pay | Admitting: Physician Assistant

## 2012-12-19 ENCOUNTER — Ambulatory Visit (INDEPENDENT_AMBULATORY_CARE_PROVIDER_SITE_OTHER): Payer: 59 | Admitting: Physician Assistant

## 2012-12-19 VITALS — BP 122/82 | HR 72 | Temp 98.0°F | Resp 16 | Ht 72.5 in | Wt 187.0 lb

## 2012-12-19 DIAGNOSIS — E785 Hyperlipidemia, unspecified: Secondary | ICD-10-CM

## 2012-12-19 DIAGNOSIS — E119 Type 2 diabetes mellitus without complications: Secondary | ICD-10-CM

## 2012-12-19 DIAGNOSIS — N50819 Testicular pain, unspecified: Secondary | ICD-10-CM

## 2012-12-19 DIAGNOSIS — IMO0001 Reserved for inherently not codable concepts without codable children: Secondary | ICD-10-CM

## 2012-12-19 DIAGNOSIS — F3289 Other specified depressive episodes: Secondary | ICD-10-CM

## 2012-12-19 DIAGNOSIS — N509 Disorder of male genital organs, unspecified: Secondary | ICD-10-CM

## 2012-12-19 DIAGNOSIS — F32A Depression, unspecified: Secondary | ICD-10-CM

## 2012-12-19 DIAGNOSIS — F329 Major depressive disorder, single episode, unspecified: Secondary | ICD-10-CM | POA: Insufficient documentation

## 2012-12-19 LAB — HEMOGLOBIN A1C
Hgb A1c MFr Bld: 9.5 % — ABNORMAL HIGH (ref <5.7)
Mean Plasma Glucose: 226 mg/dL — ABNORMAL HIGH (ref <117)

## 2012-12-19 LAB — BASIC METABOLIC PANEL
BUN: 9 mg/dL (ref 6–23)
CO2: 28 mEq/L (ref 19–32)
Calcium: 9.8 mg/dL (ref 8.4–10.5)
Chloride: 99 mEq/L (ref 96–112)
Creat: 0.95 mg/dL (ref 0.50–1.35)
Glucose, Bld: 168 mg/dL — ABNORMAL HIGH (ref 70–99)
Potassium: 4.6 mEq/L (ref 3.5–5.3)
Sodium: 136 mEq/L (ref 135–145)

## 2012-12-19 MED ORDER — ESCITALOPRAM OXALATE 10 MG PO TABS: 10.0000 mg | ORAL_TABLET | Freq: Every day | ORAL | Status: DC

## 2012-12-19 MED ORDER — LISINOPRIL 2.5 MG PO TABS
2.5000 mg | ORAL_TABLET | Freq: Every day | ORAL | Status: DC
Start: 2012-12-19 — End: 2013-04-03

## 2012-12-19 NOTE — Assessment & Plan Note (Addendum)
Patient with continued symptoms despite 2-year therapy with Celexa. Denies SI/HI. Will attempt switch to Lexapro and titrate dose accordingly.  Patient to return in 4-6 weeks to reassess symptoms.  Patient given information about Water quality scientist and Therapists to talk with.

## 2012-12-19 NOTE — Patient Instructions (Signed)
Please stop taking citalopram and begin taking Lexapro.  You will be contacted by a Urologist office for an appointment.  Please start taking lisinopril 2.5 mg tablet daily to protect your kidneys from diabetes.  i will want to see you in 1 month for a quick BP check and assessment of your depression.  Please obtain labs today.  I will call you with your results and adjust medicines if necessary.  Depression, Adult Depression is feeling sad, low, down in the dumps, blue, gloomy, or empty. In general, there are two kinds of depression:  Normal sadness or grief. This can happen after something upsetting. It often goes away on its own within 2 weeks. After losing a loved one (bereavement), normal sadness and grief may last longer than two weeks. It usually gets better with time.  Clinical depression. This kind lasts longer than normal sadness or grief. It keeps you from doing the things you normally do in life. It is often hard to function at home, work, or at school. It may affect your relationships with others. Treatment is often needed. GET HELP RIGHT AWAY IF:  You have thoughts about hurting yourself or others.  You lose touch with reality (psychotic symptoms). You may:  See or hear things that are not real.  Have untrue beliefs about your life or people around you.  Your medicine is giving you problems. MAKE SURE YOU:  Understand these instructions.  Will watch your condition.  Will get help right away if you are not doing well or get worse. Document Released: 04/09/2010 Document Revised: 11/30/2011 Document Reviewed: 04/09/2010 [facility] Patient Information 2014 [place], [place].

## 2012-12-19 NOTE — Assessment & Plan Note (Signed)
Will obtain lab results from recent physical with [facility] prior PCP

## 2012-12-19 NOTE — Assessment & Plan Note (Signed)
Will obtain A1C, BMP, Urinalysis and Microalbumin today.  Patient encouraged to check blood sugars at home and to keep a blood sugar journal.

## 2012-12-19 NOTE — Progress Notes (Signed)
Patient ID: Nathaniel [name], male   DOB: 07/11/1965, 47 y.o.   MRN: [id number]  Patient presents to clinic today to transfer care.  Patient was seeing Dr. Rodena Medin in the past.  Patient transferred care to [facility] due to change in Insurance.  His wife is now a [employer] so he is switching care back to use because he is on her insurance.  Acute Concerns:    Patient reports chronic right-sided epididymal pain following a bout of acute epididymitis in June.  Patient reports full workup from last PCP Dr. Fabienne Bruns including UA, G/C and Scrotal US.  Only positve finding, patient reports, was swelling of epididymus on [place].  Patient endorses being treated with a round of ciprofloxacin, which helped his symptoms, but states symptoms returned a month later and he was treated with Amoxicillin. States he still has occasional tenderness of that area.  Denies swelling or redness.  Denies penile pain or discharge.  Is in a monogamous relationship with his wife and has not concern of STD.  Is requesting referral to Urology.   Chronic Issues: (1) Diabetes Mellitus -- Patient with history of uncontrolled diabetes in the past.  Will obtain records from prior PCP at [facility].  Patient overdue for A1C.  Endorses taking Metformin 1000 mg BID, Humalog 5-10 units at mealtimes, and Lantus 11 units at bedtime, and 12 units in the am.  Patient endorses when he does check his sugar, his BS before breakfast is usually in the 140 range.  Patient is not currently on low-dose ACEI for protection of kidneys.  (2) GERD -- patient reports complete symptom control with daily omeprazole.  (3) Depression -- Patient reports continued depressed mood and anhedonia despite treatment with Celexa 20 mg. Endorses being on medication for the past couple of years.  Does not feel it is helping at all.  Denies GI upset or sexual side effects with medication.  States he has been running to help with both fitness and his mood with some help.  Has  not seen a Psychologist/Psychiatrist for his depression.  Endorses taking Lexapro in the past that worked very well for him.  States he switched to Celexa due to cost of Lexapro with his previous insurance.  (4) Hyperlipidemia -- Patient with reported history of high cholesterol in our EMR..  States it has been well-controlled with diet and exercise.  Endorses having complete physical with labs 3 months ago. We have had patient sign release of medical records from previous PCP Dr. Fabienne Bruns to obtain his lab results.  Health Maintenance: Dental -- overdue.  Discussed importance of dental checkups, especially in patients with diabetes. Vision -- April 2014.  Reports no abnormal findings at visit. Immunizations -- Reports being up-to-date.  Declines flu shot today.   Past Medical History  Diagnosis Date  . Depression   . Hyperlipidemia   . Hypogonadism male   . Headache(784.0)   . Diabetes mellitus     type II  . GERD (gastroesophageal reflux disease)     Current Outpatient Prescriptions on File Prior to Visit  Medication Sig Dispense Refill  . Blood Glucose Monitoring Suppl (ONE TOUCH ULTRA 2) W/DEVICE KIT Use as instructed to check blood sugar three times daily  Dx 250.02  1 each  0  . glucagon (GLUCAGON EMERGENCY) 1 MG injection Inject 1 mg into the muscle once as needed.  1 each  3  . glucose blood (ONE TOUCH ULTRA TEST) test strip Use as instructed to check blood sugar  three times daily  Dx 250.02  300 each  6  . HUMALOG KWIKPEN 100 UNIT/ML injection inject 5 to 10 units with meals  15 mL  3  . Insulin Glargine (LANTUS SOLOSTAR) 100 UNIT/ML SOPN Inject 18 Units into the skin at bedtime.  3 mL  3  . insulin glargine (LANTUS) 100 UNIT/ML injection Inject 27 Units into the skin at bedtime.       . Lancets (ONETOUCH ULTRASOFT) lancets Use as instructed to check blood sugar three times daily  Dx 250.02  300 each  6  . metFORMIN (GLUCOPHAGE) 1000 MG tablet TAKE 1 TABLET BY MOUTH TWICE DAILY  WITH FOOD  60 tablet  2  . omeprazole (PRILOSEC) 40 MG capsule Take 1 capsule (40 mg total) by mouth daily.  30 capsule  5  . isometheptene-acetaminophen-dichloralphenazone (MIDRIN) 65-325-100 MG capsule Take 1 capsule by mouth 4 (four) times daily as needed.  30 capsule  1  . lansoprazole (PREVACID) 30 MG capsule Take 30 mg by mouth daily.       No current facility-administered medications on file prior to visit.    Allergies  Allergen Reactions  . Eggs Or Egg-Derived Products   . Metoclopramide Hcl     REACTION: Jittery, increased anxiety  . Pioglitazone     REACTION: headache  . Promethazine Hcl     REACTION: nausea \\T \ vomiting  . Reglan [Metoclopramide]     Family History  Problem Relation Age of Onset  . Adopted: Yes  . Cancer Father 52    Prostate cancer    History   Social History  . Marital Status: Married    Spouse Name: Asencion Islam    Number of Children: 2  . Years of Education: N/A   Occupational History  .      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 Smoking status: Never Smoker   . Smokeless tobacco: Never Used  . Alcohol Use: No  . Drug Use: No  . Sexual Activity: None   Other Topics Concern  . 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   Review of Systems  Constitutional: Negative for fever, chills and weight loss.  HENT: Negative for hearing loss, ear pain, neck pain, tinnitus and ear discharge.   Eyes: Negative for blurred vision, double vision, photophobia and pain.  Respiratory: Negative for cough, shortness of breath and wheezing.   Cardiovascular: Negative for chest pain and palpitations.  Gastrointestinal: Negative for heartburn, nausea, vomiting, abdominal pain, diarrhea, constipation, blood in stool and melena.  Genitourinary: Negative for dysuria, urgency, frequency, hematuria and flank pain.  Musculoskeletal: Negative for back pain.   Neurological: Negative for dizziness, seizures, loss of consciousness and headaches.  Endo/Heme/Allergies: Positive for environmental allergies.  Psychiatric/Behavioral: Positive for depression. Negative for suicidal ideas and substance abuse. The patient is nervous/anxious.    Filed Vitals:   12/19/12 0852  BP: 122/82  Pulse: 72  Temp: 98 F (36.7 C)  Resp: 16    Physical Exam  Vitals reviewed. Constitutional: He is oriented to person, place, and time and well-developed, well-nourished, and in no distress.  HENT:  Head: Normocephalic and atraumatic.  Right Ear: External ear normal.  Left Ear: External ear normal.  Nose: Nose normal.  Mouth/Throat: Oropharynx is clear and moist. No oropharyngeal exudate.  TM WNL bilaterally  Eyes: Conjunctivae and EOM are normal. Pupils are equal, round, and  reactive to light.  Neck: Normal range of motion. Neck supple. No thyromegaly present.  Cardiovascular: Normal rate, regular rhythm, normal heart sounds and intact distal pulses.   Pulmonary/Chest: Effort normal and breath sounds normal. No respiratory distress. He has no wheezes.  Abdominal: Soft. Bowel sounds are normal. He exhibits no distension and no mass. There is no tenderness. There is no rebound and no guarding.  Lymphadenopathy:    He has no cervical adenopathy.  Neurological: He is alert and oriented to person, place, and time. He has normal reflexes. No cranial nerve deficit.  Skin: Skin is warm and dry. No rash noted.   Assessment/Plan: DEPRESSION Patient with continued symptoms despite 2-year therapy with Celexa. Denies SI/HI. Will attempt switch to Lexapro and titrate dose accordingly.  Patient to return in 4-6 weeks to reassess symptoms.  Patient given information about Water quality scientist and Therapists to talk with.  HYPERLIPIDEMIA Will obtain lab results from recent physical with [facility] prior PCP  DIABETES MELLITUS, TYPE II, UNCONTROLLED Will obtain A1C, BMP,  Urinalysis and Microalbumin today.  Patient encouraged to check blood sugars at home and to keep a blood sugar journal.  Epididymal pain Patient reports history of chronic epididymitis.  Asymptomatic today.  Will place urology referral per patient's request.

## 2012-12-19 NOTE — Assessment & Plan Note (Signed)
Patient reports history of chronic epididymitis.  Asymptomatic today.  Will place urology referral per patient's request.

## 2012-12-20 LAB — URINALYSIS, ROUTINE W REFLEX MICROSCOPIC
Bilirubin Urine: NEGATIVE
Glucose, UA: 100 mg/dL — AB
Hgb urine dipstick: NEGATIVE
Ketones, ur: NEGATIVE mg/dL
Leukocytes, UA: NEGATIVE
Nitrite: NEGATIVE
Protein, ur: NEGATIVE mg/dL
Specific Gravity, Urine: 1.013 (ref 1.005–1.030)
Urobilinogen, UA: 0.2 mg/dL (ref 0.0–1.0)
pH: 7 (ref 5.0–8.0)

## 2012-12-20 LAB — MICROALBUMIN / CREATININE URINE RATIO
Creatinine, Urine: 93.5 mg/dL
Microalb Creat Ratio: 5.3 mg/g (ref 0.0–30.0)
Microalb, Ur: 0.5 mg/dL (ref 0.00–1.89)

## 2012-12-21 ENCOUNTER — Telehealth: Payer: Self-pay | Admitting: Physician Assistant

## 2012-12-21 NOTE — Telephone Encounter (Signed)
LMOM [per pt request on return call message] with contact name and number [for return call, if needed] RE: results and further provider instructions/SLS

## 2012-12-21 NOTE — Telephone Encounter (Signed)
Wanted to discuss recent A1C results with patient.  Instruct patient to increase evening Lantus by 1 unit a night until he gets an AM blood sugar reading between 120-130.  Once that level is obtained, stop increasing Lantus dosage.  Check blood sugar before each meal and at bedtime.  Keep a log of blood sugars to bring to next appointment.  He is to schedule an appointment in 2 weeks.

## 2013-01-07 ENCOUNTER — Other Ambulatory Visit: Payer: Self-pay | Admitting: Physician Assistant

## 2013-01-07 DIAGNOSIS — IMO0001 Reserved for inherently not codable concepts without codable children: Secondary | ICD-10-CM

## 2013-01-07 MED ORDER — GLUCOSE BLOOD VI STRP: ORAL_STRIP | Status: DC

## 2013-01-07 MED ORDER — ONETOUCH ULTRASOFT LANCETS MISC: Status: DC

## 2013-01-19 ENCOUNTER — Ambulatory Visit: Payer: 59

## 2013-01-21 ENCOUNTER — Ambulatory Visit: Payer: 59 | Admitting: Physician Assistant

## 2013-01-24 ENCOUNTER — Ambulatory Visit (INDEPENDENT_AMBULATORY_CARE_PROVIDER_SITE_OTHER): Payer: 59 | Admitting: Physician Assistant

## 2013-01-24 ENCOUNTER — Encounter: Payer: Self-pay | Admitting: Physician Assistant

## 2013-01-24 VITALS — BP 98/70 | HR 70 | Temp 98.0°F | Resp 16 | Ht 72.5 in | Wt 192.2 lb

## 2013-01-24 DIAGNOSIS — F329 Major depressive disorder, single episode, unspecified: Secondary | ICD-10-CM

## 2013-01-24 DIAGNOSIS — F3289 Other specified depressive episodes: Secondary | ICD-10-CM

## 2013-01-24 DIAGNOSIS — IMO0001 Reserved for inherently not codable concepts without codable children: Secondary | ICD-10-CM

## 2013-01-24 NOTE — Patient Instructions (Signed)
Please continue current regimen.  Continue diabetes classes.  I would like to see you in Dec/January for follow-up.  Please return sooner if you need [place].

## 2013-01-25 NOTE — Progress Notes (Signed)
Patient ID: Nathaniel [name], male   DOB: Mar 13, 1966, 47 y.o.   MRN: [id number]  Patient presents to clinic today for 1 month follow-up of diabetes.  Patient endorses taking medications as prescribed.  Patient reports taking his long-acting insulin as prescribed -- 13 units AM, 13 units PM.  Has been doing better about taking humalog and metformin.  Average am blood sugars are in the 130s-140s on most days.  Sometimes am blood sugars in the upper 100s/low 200s, but on night's that patient has been noncompliant with medication.  Patient is now enrolled in a diabetes management program and feels he is learning more about how to control his blood sugar.  States he also feels accountable to other members of the group, so he is taking better care of himself.  Patient is due in 2 months for repeat CBC.  Patient instructed continue his diabetes classes and medication regimen.  Patient denies numbness or tingling of feet.  Patient also states his anxiety is doing much better at present.  He has become more accustomed to his job and is happy.  Past Medical History  Diagnosis Date  . Depression   . Hyperlipidemia   . Hypogonadism male   . Headache(784.0)   . Diabetes mellitus     type II  . GERD (gastroesophageal reflux disease)     Current Outpatient Prescriptions on File Prior to Visit  Medication Sig Dispense Refill  . Blood Glucose Monitoring Suppl (ONE TOUCH ULTRA 2) W/DEVICE KIT Use as instructed to check blood sugar three times daily  Dx 250.02  1 each  0  . escitalopram (LEXAPRO) 10 MG tablet Take 1 tablet (10 mg total) by mouth daily.  30 tablet  2  . glucagon (GLUCAGON EMERGENCY) 1 MG injection Inject 1 mg into the muscle once as needed.  1 each  3  . glucose blood (ONE TOUCH ULTRA TEST) test strip Use as instructed to check blood sugar three times daily  Dx 250.02  300 each  6  . HUMALOG KWIKPEN 100 UNIT/ML injection inject 5 to 10 units with meals  15 mL  3  . Lancets (ONETOUCH ULTRASOFT)  lancets Use as instructed to check blood sugar three times daily  Dx 250.02  300 each  6  . lisinopril (PRINIVIL,ZESTRIL) 2.5 MG tablet Take 1 tablet (2.5 mg total) by mouth daily.  30 tablet  2  . metFORMIN (GLUCOPHAGE) 1000 MG tablet TAKE 1 TABLET BY MOUTH TWICE DAILY WITH FOOD  60 tablet  2  . omeprazole (PRILOSEC) 40 MG capsule Take 1 capsule (40 mg total) by mouth daily.  30 capsule  5   No current facility-administered medications on file prior to visit.    Allergies  Allergen Reactions  . Eggs Or Egg-Derived Products   . Metoclopramide Hcl     REACTION: Jittery, increased anxiety  . Pioglitazone     REACTION: headache  . Promethazine Hcl     REACTION: nausea \\T \ vomiting  . Reglan [Metoclopramide]     Family History  Problem Relation Age of Onset  . Adopted: Yes  . Cancer Father 37    Prostate cancer    History   Social History  . Marital Status: Married    Spouse Name: Asencion Islam    Number of Children: 2  . Years of Education: N/A   Occupational History  .      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 Smoking status: Never Smoker   .  Smokeless tobacco: Never Used  . Alcohol Use: No  . Drug Use: No  . Sexual Activity: None   Other Topics Concern  . 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   ROS See HPI.  All other ROS are negative.   Filed Vitals:   01/24/13 1027  BP: 98/70  Pulse: 70  Temp: 98 F (36.7 C)  Resp: 16   Physical Exam  Vitals reviewed. Constitutional: He is oriented to person, place, and time and well-developed, well-nourished, and in no distress.  HENT:  Head: Normocephalic and atraumatic.  Eyes: Conjunctivae are normal.  Cardiovascular: Normal rate, regular rhythm, normal heart sounds and intact distal pulses.   Pulmonary/Chest: Effort normal and breath sounds normal. No respiratory distress. He has no wheezes. He has no rales. He  exhibits no tenderness.  Neurological: He is alert and oriented to person, place, and time.  Skin: Skin is warm and dry.  Psychiatric: Affect normal.  Patient seems much more animated when compared to previous visit.     Recent Results (from the past 2160 hour(s))  HEMOGLOBIN A1C     Status: Abnormal   Collection Time    12/19/12  9:34 AM      Result Value Range   Hemoglobin A1C 9.5 (*) <5.7 %   Comment:                                                                            According to the ADA Clinical Practice Recommendations for 2011, when     HbA1c is used as a screening test:             >=6.5%   Diagnostic of Diabetes Mellitus                (if abnormal result is confirmed)           5.7-6.4%   Increased risk of developing Diabetes Mellitus           References:Diagnosis and Classification of Diabetes Mellitus,Diabetes     Care,2011,34(Suppl 1):S62-S69 and Standards of Medical Care in             Diabetes - 2011,Diabetes Care,2011,34 (Suppl 1):S11-S61.         Mean Plasma Glucose 226 (*) <117 mg/dL  URINALYSIS, ROUTINE W REFLEX MICROSCOPIC     Status: Abnormal   Collection Time    12/19/12  9:34 AM      Result Value Range   Color, Urine YELLOW  YELLOW   APPearance CLEAR  CLEAR   Specific Gravity, Urine 1.013  1.005 - 1.030   pH 7.0  5.0 - 8.0   Glucose, UA 100 (*) NEG mg/dL   Bilirubin Urine NEG  NEG   Ketones, ur NEG  NEG mg/dL   Hgb urine dipstick NEG  NEG   Protein, ur NEG  NEG mg/dL   Urobilinogen, UA 0.2  0.0 - 1.0 mg/dL   Nitrite NEG  NEG   Leukocytes, UA NEG  NEG  MICROALBUMIN / CREATININE URINE RATIO     Status: None   Collection Time    12/19/12  9:34 AM  Result Value Range   Microalb, Ur 0.50  0.00 - 1.89 mg/dL   Creatinine, Urine 40.9     Microalb Creat Ratio 5.3  0.0 - 30.0 mg/g  BASIC METABOLIC PANEL     Status: Abnormal   Collection Time    12/19/12  9:34 AM      Result Value Range   Sodium 136  135 - 145 mEq/L   Potassium 4.6  3.5  - 5.3 mEq/L   Chloride 99  96 - 112 mEq/L   CO2 28  19 - 32 mEq/L   Glucose, Bld 168 (*) 70 - 99 mg/dL   BUN 9  6 - 23 mg/dL   Creat 8.11  9.14 - 7.82 mg/dL   Calcium 9.8  8.4 - 95.6 mg/dL    Assessment/Plan: DIABETES MELLITUS, TYPE II, UNCONTROLLED Blood sugar log is much improved from last visit.  Continue current regimen.  Continue diabetes classes.  Follow-up in December/January for repeat labs.  DEPRESSION Patient improving with Lexapro.  Will continue current regimen.  F/U in 1-2 months.

## 2013-01-25 NOTE — Assessment & Plan Note (Signed)
Blood sugar log is much improved from last visit.  Continue current regimen.  Continue diabetes classes.  Follow-up in December/January for repeat labs.

## 2013-01-25 NOTE — Assessment & Plan Note (Signed)
Patient improving with Lexapro.  Will continue current regimen.  F/U in 1-2 months.

## 2013-01-31 ENCOUNTER — Encounter: Payer: 59 | Attending: Physician Assistant

## 2013-01-31 VITALS — Ht 71.0 in | Wt 194.5 lb

## 2013-01-31 DIAGNOSIS — Z713 Dietary counseling and surveillance: Secondary | ICD-10-CM | POA: Insufficient documentation

## 2013-01-31 DIAGNOSIS — IMO0001 Reserved for inherently not codable concepts without codable children: Secondary | ICD-10-CM | POA: Insufficient documentation

## 2013-01-31 NOTE — Progress Notes (Signed)
Patient was seen on 01/31/13 for the first of a series of three diabetes self-management courses at the [facility].  Current HbA1c: 8.8%  The following learning objectives were met by the patient during this class:  Describe diabetes  State some common risk factors for diabetes  Defines the role of glucose and insulin  Identifies type of diabetes and pathophysiology  Describe the relationship between diabetes and cardiovascular risk  State the members of the Healthcare Team  States the rationale for glucose monitoring  State when to test glucose  State their individual Target Range  State the importance of logging glucose readings  Describe how to interpret glucose readings  Identifies A1C target  Explain the correlation between A1c and eAG values  State symptoms and treatment of high blood glucose  State symptoms and treatment of low blood glucose  Explain proper technique for glucose testing  Identifies proper sharps disposal  Handouts given during class include:  Living Well with Diabetes book  Carb Counting and Meal Planning book  Meal Plan Card  Carbohydrate guide  Meal planning worksheet  Low Sodium Flavoring Tips  The diabetes portion plate  Low Carbohydrate Snack Suggestions  A1c to eAG Conversion Chart  Diabetes Medications  Stress Management  Diabetes Recommended Care Schedule  Diabetes Success Plan  Core Class Satisfaction Survey  Your patient has identified their diabetes care support plan as:  [facility]  Staff  Family  Link to Wellness Program  Follow-Up Plan:  Attend core 2

## 2013-02-07 DIAGNOSIS — IMO0001 Reserved for inherently not codable concepts without codable children: Secondary | ICD-10-CM

## 2013-02-07 NOTE — Progress Notes (Signed)
Patient was seen on 02/07/13 for the second of a series of three diabetes self-management courses at the [facility]. The following learning objectives were met by the patient during this class:   Describe the role of different macronutrients on glucose  Explain how carbohydrates affect blood glucose  State what foods contain the most carbohydrates  Demonstrate carbohydrate counting  Demonstrate how to read Nutrition Facts food label  Describe effects of various fats on heart health  Describe the importance of good nutrition for health and healthy eating strategies  Describe techniques for managing your shopping, cooking and meal planning  List strategies to follow meal plan when dining out  Describe the effects of alcohol on glucose and how to use it safely  Follow-Up Plan:  Attend Core 3  Work towards following your personal food plan.   

## 2013-02-18 ENCOUNTER — Emergency Department (INDEPENDENT_AMBULATORY_CARE_PROVIDER_SITE_OTHER): Payer: 59

## 2013-02-18 ENCOUNTER — Encounter: Payer: Self-pay | Admitting: Emergency Medicine

## 2013-02-18 ENCOUNTER — Emergency Department
Admission: EM | Admit: 2013-02-18 | Discharge: 2013-02-18 | Disposition: A | Discharge status: Home / Self Care | Payer: 59 | Source: Home / Self Care | Attending: Family Medicine | Admitting: Family Medicine

## 2013-02-18 DIAGNOSIS — M25519 Pain in unspecified shoulder: Secondary | ICD-10-CM

## 2013-02-18 DIAGNOSIS — M25511 Pain in right shoulder: Secondary | ICD-10-CM

## 2013-02-18 MED ORDER — VALACYCLOVIR HCL 1 G PO TABS: 1000.0000 mg | ORAL_TABLET | Freq: Three times a day (TID) | ORAL | Status: DC

## 2013-02-18 MED ORDER — HYDROCODONE-ACETAMINOPHEN 5-325 MG PO TABS: ORAL_TABLET | ORAL | Status: DC

## 2013-02-18 NOTE — ED Provider Notes (Signed)
CSN: [id number]     Arrival date & time 02/18/13  0809 History   First MD Initiated Contact with Patient 02/18/13 0827     Chief Complaint  Patient presents with  . Shoulder Pain      HPI Comments: Patient developed vague pain in his right shoulder three days ago that has gradually become worse, and expanding to radiate in his right upper arm.  He recalls no injury, and no recent new physical activities.  No rash.  He feels well otherwise.  He has had a poor response to NSAID'S.  Patient is a 47 y.o. male presenting with shoulder pain. The history is provided by the patient.  Shoulder Pain This is a new problem. Episode onset: 3 days ago. The problem occurs constantly. The problem has been gradually worsening. Pertinent negatives include no chest pain, no abdominal pain, no headaches and no shortness of breath. Exacerbated by: movement of right shoulder. Nothing relieves the symptoms. He has tried acetaminophen (Ibuprofen and Aleve) for the symptoms. The treatment provided no relief.    Past Medical History  Diagnosis Date  . Depression   . Hyperlipidemia   . Hypogonadism male   . Headache(784.0)   . Diabetes mellitus     type II  . GERD (gastroesophageal reflux disease)    Past Surgical History  Procedure Laterality Date  . Tonsillectomy  1972  . Nasal sinus surgery  04/1991   Family History  Problem Relation Age of Onset  . Adopted: Yes  . Cancer Father 69    Prostate cancer   History  Substance Use Topics  . Smoking status: Never Smoker   . Smokeless tobacco: Never Used  . Alcohol Use: No    Review of Systems  Respiratory: Negative for shortness of breath.   Cardiovascular: Negative for chest pain.  Gastrointestinal: Negative for abdominal pain.  Neurological: Negative for headaches.  All other systems reviewed and are negative.    Allergies  Eggs or egg-derived products; Metoclopramide hcl; Pioglitazone; Promethazine hcl; and Reglan  Home Medications    Current Outpatient Rx  Name  Route  Sig  Dispense  Refill  . Blood Glucose Monitoring Suppl (ONE TOUCH ULTRA 2) W/DEVICE KIT      Use as instructed to check blood sugar three times daily  Dx 250.02   1 each   0   . escitalopram (LEXAPRO) 10 MG tablet   Oral   Take 1 tablet (10 mg total) by mouth daily.   30 tablet   2   . glucagon (GLUCAGON EMERGENCY) 1 MG injection   Intramuscular   Inject 1 mg into the muscle once as needed.   1 each   3   . glucose blood (ONE TOUCH ULTRA TEST) test strip      Use as instructed to check blood sugar three times daily  Dx 250.02   300 each   6     Patient needs test strips for TrueResult Meter.  C ...   . HUMALOG KWIKPEN 100 UNIT/ML injection      inject 5 to 10 units with meals   15 mL   3   . HYDROcodone-acetaminophen (NORCO/VICODIN) 5-325 MG per tablet      Take one by mouth at bedtime as needed for pain   10 tablet   0   . Insulin Glargine 100 UNIT/ML SOPN   Subcutaneous   Inject 13 Units into the skin 2 (two) times daily. Morning & Night.         Marland Kitchen  Lancets (ONETOUCH ULTRASOFT) lancets      Use as instructed to check blood sugar three times daily  Dx 250.02   300 each   6   . lisinopril (PRINIVIL,ZESTRIL) 2.5 MG tablet   Oral   Take 1 tablet (2.5 mg total) by mouth daily.   30 tablet   2   . meloxicam (MOBIC) 15 MG tablet   Oral   Take 15 mg by mouth daily.         . metFORMIN (GLUCOPHAGE) 1000 MG tablet      TAKE 1 TABLET BY MOUTH TWICE DAILY WITH FOOD   60 tablet   2   . omeprazole (PRILOSEC) 40 MG capsule   Oral   Take 1 capsule (40 mg total) by mouth daily.   30 capsule   5   . valACYclovir (VALTREX) 1000 MG tablet   Oral   Take 1 tablet (1,000 mg total) by mouth 3 (three) times daily. (Rx void after 02/26/13)   21 tablet   0    BP 109/74  Pulse 54  Temp(Src) 98.5 F (36.9 C) (Oral)  Ht 5\' 11"  (1.803 m)  Wt 195 lb (88.451 kg)  BMI 27.21 kg/m2  SpO2 98% Physical Exam  Nursing note  and vitals reviewed. Constitutional: He is oriented to person, place, and time. He appears well-developed and well-nourished. No distress.  HENT:  Head: Normocephalic and atraumatic.  Mouth/Throat: Oropharynx is clear and moist.  Eyes: Conjunctivae are normal. Pupils are equal, round, and reactive to light.  Neck: Normal range of motion. Neck supple.  Cardiovascular: Normal heart sounds.   Pulmonary/Chest: Breath sounds normal.  Abdominal: Bowel sounds are normal.  Musculoskeletal:       Right shoulder: He exhibits tenderness and pain. He exhibits normal range of motion, no bony tenderness, no swelling, no effusion, no crepitus, no deformity, no spasm, normal pulse and normal strength.       Arms: Vague tenderness to palpation right shoulder and neck area as noted on diagram.  There is mild tenderness over right biceps tendon insertion.  Negative empty can and Apley's test.  Distal neurovascular function is intact.   Lymphadenopathy:    He has no cervical adenopathy.  Neurological: He is alert and oriented to person, place, and time.  Skin: Skin is warm and dry. No rash noted.    ED Course  Procedures  none    Imaging Review Dg Shoulder Right  02/18/2013   CLINICAL DATA:  Pain  EXAM: RIGHT SHOULDER - 2+ VIEW  COMPARISON:  None.  FINDINGS: Frontal, Y scapular, and axillary images were obtained. No fracture or dislocation. Joint spaces appear intact. No erosive change.  IMPRESSION: No abnormality noted.   Electronically Signed   By: Bretta Bang M.D.   On: 02/18/2013 09:06      MDM   1. Shoulder pain, acute, right; ?Early herpes zoster.     Try applying ice pack 2 or 3 times daily.  May take meloxicam (Mobic) 15mg  once daily with food.  If rash appears, begin Valtrex immediately (Given a prescription to hold, with an expiration date).  With mild tenderness over insertion of right biceps tendon, will have him begin stretching and range of motion exercises for biceps tendonopathy.   Followup with Family Doctor if not improved in one week.     Lattie Haw, MD 02/18/13 [phone]

## 2013-02-18 NOTE — ED Notes (Signed)
Rt shoulder pain x 3 days, denies trauma

## 2013-02-20 ENCOUNTER — Ambulatory Visit (INDEPENDENT_AMBULATORY_CARE_PROVIDER_SITE_OTHER): Payer: 59 | Admitting: Physician Assistant

## 2013-02-20 ENCOUNTER — Encounter: Payer: Self-pay | Admitting: Physician Assistant

## 2013-02-20 VITALS — BP 124/82 | HR 73 | Temp 97.8°F | Resp 16 | Ht 71.0 in | Wt 195.0 lb

## 2013-02-20 DIAGNOSIS — M79609 Pain in unspecified limb: Secondary | ICD-10-CM

## 2013-02-20 DIAGNOSIS — M62838 Other muscle spasm: Secondary | ICD-10-CM

## 2013-02-20 DIAGNOSIS — M79601 Pain in right arm: Secondary | ICD-10-CM

## 2013-02-20 MED ORDER — CYCLOBENZAPRINE HCL 5 MG PO TABS: 5.0000 mg | ORAL_TABLET | Freq: Three times a day (TID) | ORAL | Status: DC | PRN

## 2013-02-20 NOTE — Patient Instructions (Signed)
Please begin taking Valtrex in case of shingles.  Continue to apply topical Aspercreme to the area.  Continue with iburpofen and Norco for severe pain.  Avoid overhead motion of arm.  If symptoms persist, we will need to send you to a Sports Medicine Physician  Shingles Shingles (herpes zoster) is an infection that is caused by the same virus that causes chickenpox (varicella). The infection causes a painful skin rash and fluid-filled blisters, which eventually break open, crust over, and heal. It may occur in any area of the body, but it usually affects only one side of the body or face. The pain of shingles usually lasts about 1 month. However, some people with shingles may develop long-term (chronic) pain in the affected area of the body. Shingles often occurs many years after the person had chickenpox. It is more common:  In people older than 50 years.  In people with weakened immune systems, such as those with HIV, AIDS, or cancer.  In people taking medicines that weaken the immune system, such as transplant medicines.  In people under great stress. CAUSES  Shingles is caused by the varicella zoster virus (VZV), which also causes chickenpox. After a person is infected with the virus, it can remain in the person's body for years in an inactive state (dormant). To cause shingles, the virus reactivates and breaks out as an infection in a nerve root. The virus can be spread from person to person (contagious) through contact with open blisters of the shingles rash. It will only spread to people who have not had chickenpox. When these people are exposed to the virus, they may develop chickenpox. They will not develop shingles. Once the blisters scab over, the person is no longer contagious and cannot spread the virus to others. SYMPTOMS  Shingles shows up in stages. The initial symptoms may be pain, itching, and tingling in an area of the skin. This pain is usually described as burning, stabbing, or  throbbing.In a few days or weeks, a painful red rash will appear in the area where the pain, itching, and tingling were felt. The rash is usually on one side of the body in a band or belt-like pattern. Then, the rash usually turns into fluid-filled blisters. They will scab over and dry up in approximately 2 3 weeks. Flu-like symptoms may also occur with the initial symptoms, the rash, or the blisters. These may include:  Fever.  Chills.  Headache.  Upset stomach. DIAGNOSIS  Your caregiver will perform a skin exam to diagnose shingles. Skin scrapings or fluid samples may also be taken from the blisters. This sample will be examined under a microscope or sent to a lab for further testing. TREATMENT  There is no specific cure for shingles. Your caregiver will likely prescribe medicines to help you manage the pain, recover faster, and avoid long-term problems. This may include antiviral drugs, anti-inflammatory drugs, and pain medicines. HOME CARE INSTRUCTIONS   Take a cool bath or apply cool compresses to the area of the rash or blisters as directed. This may help with the pain and itching.   Only take over-the-counter or prescription medicines as directed by your caregiver.   Rest as directed by your caregiver.  Keep your rash and blisters clean with mild soap and cool water or as directed by your caregiver.  Do not pick your blisters or scratch your rash. Apply an anti-itch cream or numbing creams to the affected area as directed by your caregiver.  Keep your shingles  rash covered with a loose bandage (dressing).  Avoid skin contact with:  Babies.   Pregnant women.   Children with eczema.   Elderly people with transplants.   People with chronic illnesses, such as leukemia or AIDS.   Wear loose-fitting clothing to help ease the pain of material rubbing against the rash.  Keep all follow-up appointments with your caregiver.If the area involved is on your face, you may  receive a referral for follow-up to a specialist, such as an eye doctor (ophthalmologist) or an ear, nose, and throat (ENT) doctor. Keeping all follow-up appointments will help you avoid eye complications, chronic pain, or disability.  SEEK IMMEDIATE MEDICAL CARE IF:   You have facial pain, pain around the eye area, or loss of feeling on one side of your face.  You have ear pain or ringing in your ear.  You have loss of taste.  Your pain is not relieved with prescribed medicines.   Your redness or swelling spreads.   You have more pain and swelling.  Your condition is worsening or has changed.   You have a feveror persistent symptoms for more than 2 3 days.  You have a fever and your symptoms suddenly get worse. MAKE SURE YOU:  Understand these instructions.  Will watch your condition.  Will get help right away if you are not doing well or get worse. Document Released: 03/07/2005 Document Revised: 11/30/2011 Document Reviewed: 10/20/2011 [facility] Patient Information 2014 [place], [place].

## 2013-02-20 NOTE — Progress Notes (Signed)
Pre visit review using our clinic review tool, if applicable. No additional management support is needed unless otherwise documented below in the visit note/SLS  

## 2013-02-22 DIAGNOSIS — B029 Zoster without complications: Secondary | ICD-10-CM | POA: Insufficient documentation

## 2013-02-22 NOTE — Progress Notes (Signed)
Patient ID: Nathaniel [name], male   DOB: [birth year], 47 y.o.   MRN: [id number]  Patient presents to clinic today complaining of pain in neck and right arm x5 days.  Patient states the pain feels like a burning sensation that is constant. Pain started all of a sudden. Patient denies history of trauma, injury heavy lifting.  States pain feels worse when laying down, but denies increase in pain with range of motion of shoulder. Denies pain elsewhere. Denies numbness or tingling. Has noticed the pain in his arm also itches occasionally. Was seen in urgent care 2 days ago and was diagnosed with possible early onset of herpes zoster. Given prescription for hydrocodone and Valtrex to start taking if the rash appeared.   Past Medical History  Diagnosis Date  . Depression   . Hyperlipidemia   . Hypogonadism male   . Headache(784.0)   . Diabetes mellitus     type II  . GERD (gastroesophageal reflux disease)     Current Outpatient Prescriptions on File Prior to Visit  Medication Sig Dispense Refill  . Blood Glucose Monitoring Suppl (ONE TOUCH ULTRA 2) W/DEVICE KIT Use as instructed to check blood sugar three times daily  Dx 250.02  1 each  0  . escitalopram (LEXAPRO) 10 MG tablet Take 1 tablet (10 mg total) by mouth daily.  30 tablet  2  . glucagon (GLUCAGON EMERGENCY) 1 MG injection Inject 1 mg into the muscle once as needed.  1 each  3  . glucose blood (ONE TOUCH ULTRA TEST) test strip Use as instructed to check blood sugar three times daily  Dx 250.02  300 each  6  . HUMALOG KWIKPEN 100 UNIT/ML injection inject 5 to 10 units with meals  15 mL  3  . HYDROcodone-acetaminophen (NORCO/VICODIN) 5-325 MG per tablet Take one by mouth at bedtime as needed for pain  10 tablet  0  . Insulin Glargine 100 UNIT/ML SOPN Inject 13 Units into the skin 2 (two) times daily. Morning & Night.      . Lancets (ONETOUCH ULTRASOFT) lancets Use as instructed to check blood sugar three times daily  Dx 250.02  300 each  6  .  lisinopril (PRINIVIL,ZESTRIL) 2.5 MG tablet Take 1 tablet (2.5 mg total) by mouth daily.  30 tablet  2  . meloxicam (MOBIC) 15 MG tablet Take 15 mg by mouth daily.      . metFORMIN (GLUCOPHAGE) 1000 MG tablet TAKE 1 TABLET BY MOUTH TWICE DAILY WITH FOOD  60 tablet  2  . omeprazole (PRILOSEC) 40 MG capsule Take 1 capsule (40 mg total) by mouth daily.  30 capsule  5  . valACYclovir (VALTREX) 1000 MG tablet Take 1 tablet (1,000 mg total) by mouth 3 (three) times daily. (Rx void after 02/26/13)  21 tablet  0   No current facility-administered medications on file prior to visit.    Allergies  Allergen Reactions  . Eggs Or Egg-Derived Products   . Metoclopramide Hcl     REACTION: Jittery, increased anxiety  . Pioglitazone     REACTION: headache  . Promethazine Hcl     REACTION: nausea \\T \ vomiting  . Reglan [Metoclopramide]     Family History  Problem Relation Age of Onset  . Adopted: Yes  . Cancer Father 24    Prostate cancer    History   Social History  . Marital Status: Married    Spouse Name: Asencion Islam    Number of Children: 2  .  Years of Education: N/A   Occupational History  .      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 Smoking status: Never Smoker   . Smokeless tobacco: Never Used  . Alcohol Use: No  . Drug Use: No  . Sexual Activity: None   Other Topics Concern  . 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    Review of Systems - see history of present illness. All other review of systems are negative.   Filed Vitals:   02/20/13 1116  BP: 124/82  Pulse: 73  Temp: 97.8 F (36.6 C)  Resp: 16   Physical Exam  Constitutional: He is oriented to person, place, and time and well-developed, well-nourished, and in no distress.  HENT:  Head: Normocephalic and atraumatic.  Eyes: Conjunctivae are normal. Pupils are equal, round, and reactive to light.  Neck: Neck  supple.  Cardiovascular: Normal rate, regular rhythm and normal heart sounds.   Pulmonary/Chest: Effort normal and breath sounds normal. No respiratory distress. He has no wheezes. He has no rales. He exhibits no tenderness.  Musculoskeletal: Normal range of motion.       Right shoulder: He exhibits tenderness. He exhibits normal range of motion, no bony tenderness, no laceration, no spasm and normal strength.       Cervical back: He exhibits tenderness and spasm. He exhibits normal range of motion, no bony tenderness and no deformity.       Right upper arm: He exhibits tenderness. He exhibits no bony tenderness, no swelling and no edema.  Lymphadenopathy:    He has no cervical adenopathy.  Neurological: He is alert and oriented to person, place, and time. No cranial nerve deficit.  Skin: Skin is warm and dry.  Presence of a clustered, papular rash on right anterior arm, at site of endorse burning pain. No rash noted elsewhere.  Psychiatric: Affect normal.     Recent Results (from the past 2160 hour(s))  HEMOGLOBIN A1C     Status: Abnormal   Collection Time    12/19/12  9:34 AM      Result Value Range   Hemoglobin A1C 9.5 (*) <5.7 %   Comment:                                                                            According to the ADA Clinical Practice Recommendations for 2011, when     HbA1c is used as a screening test:             >=6.5%   Diagnostic of Diabetes Mellitus                (if abnormal result is confirmed)           5.7-6.4%   Increased risk of developing Diabetes Mellitus           References:Diagnosis and Classification of Diabetes Mellitus,Diabetes     Care,2011,34(Suppl 1):S62-S69 and Standards of Medical Care in             Diabetes - 2011,Diabetes Care,2011,34 (Suppl 1):S11-S61.         Mean  Plasma Glucose 226 (*) <117 mg/dL  URINALYSIS, ROUTINE W REFLEX MICROSCOPIC     Status: Abnormal   Collection Time    12/19/12  9:34 AM      Result Value Range    Color, Urine YELLOW  YELLOW   APPearance CLEAR  CLEAR   Specific Gravity, Urine 1.013  1.005 - 1.030   pH 7.0  5.0 - 8.0   Glucose, UA 100 (*) NEG mg/dL   Bilirubin Urine NEG  NEG   Ketones, ur NEG  NEG mg/dL   Hgb urine dipstick NEG  NEG   Protein, ur NEG  NEG mg/dL   Urobilinogen, UA 0.2  0.0 - 1.0 mg/dL   Nitrite NEG  NEG   Leukocytes, UA NEG  NEG  MICROALBUMIN / CREATININE URINE RATIO     Status: None   Collection Time    12/19/12  9:34 AM      Result Value Range   Microalb, Ur 0.50  0.00 - 1.89 mg/dL   Creatinine, Urine 96.0     Microalb Creat Ratio 5.3  0.0 - 30.0 mg/g  BASIC METABOLIC PANEL     Status: Abnormal   Collection Time    12/19/12  9:34 AM      Result Value Range   Sodium 136  135 - 145 mEq/L   Potassium 4.6  3.5 - 5.3 mEq/L   Chloride 99  96 - 112 mEq/L   CO2 28  19 - 32 mEq/L   Glucose, Bld 168 (*) 70 - 99 mg/dL   BUN 9  6 - 23 mg/dL   Creat 4.54  0.98 - 1.19 mg/dL   Calcium 9.8  8.4 - 14.7 mg/dL    Assessment/Plan: Herpes zoster Rash noted on the arm at site of pain. Given history of chickenpox and burning sensation of pain, as well as dermatomal distribution of symptoms, cannot rule out herpes zoster. Instructed patient to begin Valtrex as prescribed by urgent care physician. Hydrocodone or Tylenol for pain. Also some minor neck spasm noted on exam. Rx Flexeril at bedtime. Return to clinic if symptoms are not improving.

## 2013-02-22 NOTE — Assessment & Plan Note (Addendum)
Rash noted on the arm at site of pain. Given history of chickenpox and burning sensation of pain, as well as dermatomal distribution of symptoms, cannot rule out herpes zoster. Instructed patient to begin Valtrex as prescribed by urgent care physician. Hydrocodone or Tylenol for pain. Also some minor neck spasm noted on exam. Rx Flexeril at bedtime. Return to clinic if symptoms are not improving.

## 2013-02-26 ENCOUNTER — Encounter: Payer: Self-pay | Admitting: Family Medicine

## 2013-02-26 ENCOUNTER — Ambulatory Visit (INDEPENDENT_AMBULATORY_CARE_PROVIDER_SITE_OTHER): Payer: 59 | Admitting: Family Medicine

## 2013-02-26 VITALS — BP 105/72 | HR 106 | Ht 72.0 in | Wt 192.0 lb

## 2013-02-26 DIAGNOSIS — M5412 Radiculopathy, cervical region: Secondary | ICD-10-CM

## 2013-02-26 DIAGNOSIS — M542 Cervicalgia: Secondary | ICD-10-CM

## 2013-02-26 DIAGNOSIS — M5010 Cervical disc disorder with radiculopathy, unspecified cervical region: Secondary | ICD-10-CM

## 2013-02-26 MED ORDER — OXYCODONE-ACETAMINOPHEN 5-325 MG PO TABS: 1.0000 | ORAL_TABLET | Freq: Four times a day (QID) | ORAL | Status: DC | PRN

## 2013-02-26 MED ORDER — PREDNISONE (PAK) 10 MG PO TABS: ORAL_TABLET | ORAL | Status: DC

## 2013-02-26 NOTE — Patient Instructions (Signed)
You have cervical radiculopathy (a pinched nerve in the neck). Prednisone 6 day dose pack to relieve irritation/inflammation of the nerve. Aleve 2 tabs twice a day with food for pain and inflammation - start day AFTER finishing prednisone if prescribed this. Consider Flexeril three times a day as needed for muscle spasms (can make you sleepy - if so do not drive while taking this). If needed fill the percocet for severe pain (no driving on this medicine). Consider cervical collar if severely painful. Simple range of motion exercises within limits of pain to prevent further stiffness. Consider physical therapy for stretching, exercises, traction, and modalities. Heat 15 minutes at a time 3-4 times a day to help with spasms. Watch head position when on computers, texting, when sleeping in bed - should in line with back to prevent further nerve traction and irritation. Follow up with me in 1 week.

## 2013-02-27 ENCOUNTER — Ambulatory Visit: Payer: 59 | Admitting: Family Medicine

## 2013-02-27 ENCOUNTER — Encounter: Payer: Self-pay | Admitting: Family Medicine

## 2013-02-27 DIAGNOSIS — M542 Cervicalgia: Secondary | ICD-10-CM

## 2013-02-27 HISTORY — DX: Cervicalgia: M54.2

## 2013-02-27 NOTE — Assessment & Plan Note (Signed)
consistent with cervical radiculopathy.  Also has diminished biceps reflex, symptoms that fit with this diagnosis.  Prednisone dose pack with transition to nsaid.  Flexeril, percocet as needed.  HEP reviewed.  Heat as needed.  Ergonomic issues discussed.  F/u in 1 week.  Consider MRI, PT if not improving.

## 2013-02-27 NOTE — Progress Notes (Signed)
Patient ID: Nathaniel [name], male   DOB: Dec 14, 1965, 47 y.o.   MRN: [id number]  PCP: Piedad Climes, PA-C  Subjective:   HPI: Patient is a 47 y.o. male here for right shoulder, neck pain.  Patient reports for about 2 weeks he has had pain posterior aspect of right shoulder and neck. Associated with radiation down his right arm. Feels like someone is trying to yank his arm off at times. Pain just woke him up one morning without an injury. Tried hydrocodone, muscle relaxant. Some question about him having shingles but took valtrex, states he did not have a blistering rash in area of pain over past 2 weeks. No bowel/bladder dysfunction. Is left handed.  Past Medical History  Diagnosis Date  . Depression   . Hyperlipidemia   . Hypogonadism male   . Headache(784.0)   . Diabetes mellitus     type II  . GERD (gastroesophageal reflux disease)     Current Outpatient Prescriptions on File Prior to Visit  Medication Sig Dispense Refill  . Blood Glucose Monitoring Suppl (ONE TOUCH ULTRA 2) W/DEVICE KIT Use as instructed to check blood sugar three times daily  Dx 250.02  1 each  0  . cyclobenzaprine (FLEXERIL) 5 MG tablet Take 1 tablet (5 mg total) by mouth 3 (three) times daily as needed for muscle spasms.  30 tablet  1  . escitalopram (LEXAPRO) 10 MG tablet Take 1 tablet (10 mg total) by mouth daily.  30 tablet  2  . glucagon (GLUCAGON EMERGENCY) 1 MG injection Inject 1 mg into the muscle once as needed.  1 each  3  . glucose blood (ONE TOUCH ULTRA TEST) test strip Use as instructed to check blood sugar three times daily  Dx 250.02  300 each  6  . HUMALOG KWIKPEN 100 UNIT/ML injection inject 5 to 10 units with meals  15 mL  3  . Insulin Glargine 100 UNIT/ML SOPN Inject 13 Units into the skin 2 (two) times daily. Morning & Night.      . Lancets (ONETOUCH ULTRASOFT) lancets Use as instructed to check blood sugar three times daily  Dx 250.02  300 each  6  . lisinopril (PRINIVIL,ZESTRIL)  2.5 MG tablet Take 1 tablet (2.5 mg total) by mouth daily.  30 tablet  2  . metFORMIN (GLUCOPHAGE) 1000 MG tablet TAKE 1 TABLET BY MOUTH TWICE DAILY WITH FOOD  60 tablet  2  . omeprazole (PRILOSEC) 40 MG capsule Take 1 capsule (40 mg total) by mouth daily.  30 capsule  5   No current facility-administered medications on file prior to visit.    Past Surgical History  Procedure Laterality Date  . Tonsillectomy  1972  . Nasal sinus surgery  04/1991    Allergies  Allergen Reactions  . Eggs Or Egg-Derived Products   . Metoclopramide Hcl     REACTION: Jittery, increased anxiety  . Pioglitazone     REACTION: headache  . Promethazine Hcl     REACTION: nausea \\T \ vomiting  . Reglan [Metoclopramide]     History   Social History  . Marital Status: Married    Spouse Name: Asencion Islam    Number of Children: 2  . Years of Education: N/A   Occupational History  .      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 Smoking status: Never Smoker   . Smokeless tobacco: Never Used  . Alcohol Use: No  . Drug Use: No  .  Sexual Activity: Not on file   Other Topics Concern  . 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    Family History  Problem Relation Age of Onset  . Adopted: Yes  . Cancer Father 3    Prostate cancer    BP 105/72  Pulse 106  Ht 6' (1.829 m)  Wt 192 lb (87.091 kg)  BMI 26.03 kg/m2  Review of Systems: See HPI above.    Objective:  Physical Exam:  Gen: NAD  Neck: No gross deformity, swelling, bruising. TTP mildly right cervical paraspinal region.  No midline/bony TTP. FROM neck - pain with flexion and right lateral rotation. BUE strength 5/5.   Sensation intact to light touch.   2+ equal reflexes in triceps, left biceps, brachioradialis tendons.  Trace right biceps reflex. Negative spurlings. NV intact distal BUEs.  R shoulder: No swelling, ecchymoses.  No  gross deformity. No TTP. FROM. Negative Hawkins, Neers. Negative Yergasons. Strength 5/5 with empty can and resisted internal/external rotation. Negative apprehension. NV intact distally.    Assessment & Plan:  1. Right neck/arm pain - consistent with cervical radiculopathy.  Also has diminished biceps reflex, symptoms that fit with this diagnosis.  Prednisone dose pack with transition to nsaid.  Flexeril, percocet as needed.  HEP reviewed.  Heat as needed.  Ergonomic issues discussed.  F/u in 1 week.  Consider MRI, PT if not improving.

## 2013-03-05 ENCOUNTER — Ambulatory Visit: Payer: 59 | Admitting: Family Medicine

## 2013-03-06 ENCOUNTER — Encounter: Payer: Self-pay | Admitting: Family Medicine

## 2013-03-06 ENCOUNTER — Ambulatory Visit (INDEPENDENT_AMBULATORY_CARE_PROVIDER_SITE_OTHER): Payer: 59 | Admitting: Family Medicine

## 2013-03-06 VITALS — BP 108/72 | HR 89 | Ht 72.0 in | Wt 192.0 lb

## 2013-03-06 DIAGNOSIS — M79609 Pain in unspecified limb: Secondary | ICD-10-CM

## 2013-03-06 DIAGNOSIS — M542 Cervicalgia: Secondary | ICD-10-CM

## 2013-03-06 DIAGNOSIS — M79601 Pain in right arm: Secondary | ICD-10-CM

## 2013-03-06 NOTE — Patient Instructions (Signed)
We will go ahead with an MRI of your cervical spine. Your description of pain, loss of biceps reflex all point to a pinched nerve - most likely would be from neck. It is too early to do the nerve conduction studies (typically have to have symptoms for at least 6 weeks). I will call you with results the business day following the MRI.

## 2013-03-07 ENCOUNTER — Encounter: Payer: 59 | Attending: Physician Assistant

## 2013-03-07 DIAGNOSIS — IMO0001 Reserved for inherently not codable concepts without codable children: Secondary | ICD-10-CM | POA: Insufficient documentation

## 2013-03-07 DIAGNOSIS — Z713 Dietary counseling and surveillance: Secondary | ICD-10-CM | POA: Insufficient documentation

## 2013-03-07 NOTE — Progress Notes (Signed)
Patient was seen on 03/07/13 for the third of a series of three diabetes self-management courses at the [facility]. The following learning objectives were met by the patient during this class:    State the amount of activity recommended for healthy living   Describe activities suitable for individual needs   Identify ways to regularly incorporate activity into daily life   Identify barriers to activity and ways to over come these barriers  Identify diabetes medications being personally used and their primary action for lowering glucose and possible side effects   Describe role of stress on blood glucose and develop strategies to address psychosocial issues   Identify diabetes complications and ways to prevent them  Explain how to manage diabetes during illness   Evaluate success in meeting personal goal   Establish 2-3 goals that they will plan to diligently work on until they return for the  56-month follow-up visit  Goals:  Follow Diabetes Meal Plan as instructed  Aim for 15-30 mins of physical activity daily as tolerated  Bring food record and glucose log to your follow up visit  Your patient has established the following 4 month goals in their individualized success plan:  Increase my activity at least 5 days a week  Look for patterns in my record book at least 5 days a month  Your patient has identified these potential barriers to change:  None noted  Your patient has identified their diabetes self-care support plan as  [facility]

## 2013-03-08 ENCOUNTER — Encounter: Payer: Self-pay | Admitting: Family Medicine

## 2013-03-08 NOTE — Assessment & Plan Note (Signed)
consistent with cervical radiculopathy.  No changes with prednisone dose pack.  Will go ahead with cervical spine MRI to further assess, call patient with results.  Flexeril, percocet as needed.  HEP reviewed.  Heat as needed.  Ergonomic issues discussed.

## 2013-03-08 NOTE — Progress Notes (Addendum)
Patient ID: Nathaniel [name], male   DOB: Jun 20, 1965, 47 y.o.   MRN: [id number]  PCP: Nathaniel Climes, PA-C  Subjective:   HPI: Patient is a 47 y.o. male here for right shoulder, neck pain.  12/9: Patient reports for about 2 weeks he has had pain posterior aspect of right shoulder and neck. Associated with radiation down his right arm. Feels like someone is trying to yank his arm off at times. Pain just woke him up one morning without an injury. Tried hydrocodone, muscle relaxant. Some question about him having shingles but took valtrex, states he did not have a blistering rash in area of pain over past 2 weeks. No bowel/bladder dysfunction. Is left handed.  12/17: Patient reports no change in his symptoms with prednisone. Gets pain from shoulder down to wrist. Upper arm feels sore. Lateral right forearm also numb. + night pain. Hard to get comfortable.  Past Medical History  Diagnosis Date  . Depression   . Hyperlipidemia   . Hypogonadism male   . Headache(784.0)   . Diabetes mellitus     type II  . GERD (gastroesophageal reflux disease)     Current Outpatient Prescriptions on File Prior to Visit  Medication Sig Dispense Refill  . Blood Glucose Monitoring Suppl (ONE TOUCH ULTRA 2) W/DEVICE KIT Use as instructed to check blood sugar three times daily  Dx 250.02  1 each  0  . cyclobenzaprine (FLEXERIL) 5 MG tablet Take 1 tablet (5 mg total) by mouth 3 (three) times daily as needed for muscle spasms.  30 tablet  1  . escitalopram (LEXAPRO) 10 MG tablet Take 1 tablet (10 mg total) by mouth daily.  30 tablet  2  . glucagon (GLUCAGON EMERGENCY) 1 MG injection Inject 1 mg into the muscle once as needed.  1 each  3  . glucose blood (ONE TOUCH ULTRA TEST) test strip Use as instructed to check blood sugar three times daily  Dx 250.02  300 each  6  . HUMALOG KWIKPEN 100 UNIT/ML injection inject 5 to 10 units with meals  15 mL  3  . Insulin Glargine 100 UNIT/ML SOPN Inject 13  Units into the skin 2 (two) times daily. Morning & Night.      . Lancets (ONETOUCH ULTRASOFT) lancets Use as instructed to check blood sugar three times daily  Dx 250.02  300 each  6  . lisinopril (PRINIVIL,ZESTRIL) 2.5 MG tablet Take 1 tablet (2.5 mg total) by mouth daily.  30 tablet  2  . metFORMIN (GLUCOPHAGE) 1000 MG tablet TAKE 1 TABLET BY MOUTH TWICE DAILY WITH FOOD  60 tablet  2  . omeprazole (PRILOSEC) 40 MG capsule Take 1 capsule (40 mg total) by mouth daily.  30 capsule  5  . oxyCODONE-acetaminophen (ROXICET) 5-325 MG per tablet Take 1 tablet by mouth every 6 (six) hours as needed for severe pain.  40 tablet  0  . predniSONE (STERAPRED UNI-PAK) 10 MG tablet 6 tabs po day 1, 5 tabs po day 2, 4 tabs po day 3, 3 tabs po day 4, 2 tabs po day 5, 1 tab po day 6  21 tablet  0   No current facility-administered medications on file prior to visit.    Past Surgical History  Procedure Laterality Date  . Tonsillectomy  1972  . Nasal sinus surgery  04/1991    Allergies  Allergen Reactions  . Eggs Or Egg-Derived Products   . Metoclopramide Hcl     REACTION:  Jittery, increased anxiety  . Pioglitazone     REACTION: headache  . Promethazine Hcl     REACTION: nausea \\T \ vomiting  . Reglan [Metoclopramide]     History   Social History  . Marital Status: Married    Spouse Name: Nathaniel [name]    Number of Children: 2  . Years of Education: N/A   Occupational History  .      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 Smoking status: Never Smoker   . Smokeless tobacco: Never Used  . Alcohol Use: No  . Drug Use: No  . Sexual Activity: Not on file   Other Topics Concern  . 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    Family History  Problem Relation Age of Onset  . Adopted: Yes  . Cancer Father 52    Prostate cancer    BP 108/72  Pulse 89  Ht 6' (1.829 m)  Wt 192 lb (87.091 kg)   BMI 26.03 kg/m2  Review of Systems: See HPI above.    Objective:  Physical Exam:  Gen: NAD  Neck: No gross deformity, swelling, bruising. TTP mildly right cervical paraspinal region.  No midline/bony TTP. FROM neck - pain with flexion and right lateral rotation. BUE strength 5/5.   Sensation intact to light touch.   2+ equal reflexes in triceps, left biceps, brachioradialis tendons.  Trace right biceps reflex. Negative spurlings. NV intact distal BUEs.    Assessment & Plan:  1. Right neck/arm pain - consistent with cervical radiculopathy.  No changes with prednisone dose pack.  Will go ahead with cervical spine MRI to further assess, call patient with results.  Flexeril, percocet as needed.  HEP reviewed.  Heat as needed.  Ergonomic issues discussed.  Addendum:  MRI reviewed and discussed with patient.  He has a broad disc protrusion at C5-6 with right foraminal narrowing/stenosis with mild cord flattening.  This would be consistent with his distribution, diminished biceps reflex.  Discussed options.  He would like to try ESI at this level.  Will plan to see him 2-4 weeks following this.

## 2013-03-09 ENCOUNTER — Ambulatory Visit (HOSPITAL_BASED_OUTPATIENT_CLINIC_OR_DEPARTMENT_OTHER)
Admission: RE | Admit: 2013-03-09 | Discharge: 2013-03-09 | Disposition: A | Discharge status: Home / Self Care | Payer: 59 | Source: Ambulatory Visit | Attending: Family Medicine | Admitting: Family Medicine

## 2013-03-09 DIAGNOSIS — M4802 Spinal stenosis, cervical region: Secondary | ICD-10-CM | POA: Insufficient documentation

## 2013-03-09 DIAGNOSIS — M79601 Pain in right arm: Secondary | ICD-10-CM

## 2013-03-09 DIAGNOSIS — M542 Cervicalgia: Secondary | ICD-10-CM | POA: Insufficient documentation

## 2013-03-09 DIAGNOSIS — M79609 Pain in unspecified limb: Secondary | ICD-10-CM | POA: Insufficient documentation

## 2013-03-09 DIAGNOSIS — M5030 Other cervical disc degeneration, unspecified cervical region: Secondary | ICD-10-CM | POA: Insufficient documentation

## 2013-03-09 DIAGNOSIS — R209 Unspecified disturbances of skin sensation: Secondary | ICD-10-CM | POA: Insufficient documentation

## 2013-03-12 ENCOUNTER — Telehealth: Payer: Self-pay | Admitting: Family Medicine

## 2013-03-12 ENCOUNTER — Other Ambulatory Visit: Payer: Self-pay | Admitting: Family Medicine

## 2013-03-12 DIAGNOSIS — M5010 Cervical disc disorder with radiculopathy, unspecified cervical region: Secondary | ICD-10-CM

## 2013-03-12 DIAGNOSIS — M5410 Radiculopathy, site unspecified: Secondary | ICD-10-CM

## 2013-03-12 MED ORDER — OXYCODONE-ACETAMINOPHEN 5-325 MG PO TABS: 1.0000 | ORAL_TABLET | Freq: Four times a day (QID) | ORAL | Status: DC | PRN

## 2013-03-12 NOTE — Telephone Encounter (Signed)
Will prescribe oxycodone one more time.  FYI he has to pick this up, can't call in pain medication.

## 2013-03-18 ENCOUNTER — Ambulatory Visit
Admission: RE | Admit: 2013-03-18 | Discharge: 2013-03-18 | Disposition: A | Discharge status: Home / Self Care | Payer: 59 | Source: Ambulatory Visit | Attending: Family Medicine | Admitting: Family Medicine

## 2013-03-18 VITALS — BP 109/70 | HR 72

## 2013-03-18 DIAGNOSIS — M5410 Radiculopathy, site unspecified: Secondary | ICD-10-CM

## 2013-03-18 MED ORDER — TRIAMCINOLONE ACETONIDE 40 MG/ML IJ SUSP (RADIOLOGY)
60.0000 mg | Freq: Once | INTRAMUSCULAR | Status: AC
  Administered 2013-03-18: 60 mg via EPIDURAL

## 2013-03-18 MED ORDER — IOHEXOL 300 MG/ML  SOLN
1.0000 mL | Freq: Once | INTRAMUSCULAR | Status: AC | PRN
  Administered 2013-03-18: 1 mL via EPIDURAL

## 2013-03-20 ENCOUNTER — Other Ambulatory Visit: Payer: Self-pay | Admitting: Family Medicine

## 2013-03-20 NOTE — Telephone Encounter (Signed)
escitalopram (LEXAPRO) 10 MG tablet 30 tablet 2 12/19/2012 Sig - Route: Take 1 tablet (10 mg total) by mouth daily. - Oral E-Prescribing Status: Receipt confirmed by pharmacy (12/19/2012 9:26 AM EDT) Refill sent per [facility] refill protocol/SLS

## 2013-03-28 ENCOUNTER — Encounter: Payer: Self-pay | Admitting: Sports Medicine

## 2013-03-28 ENCOUNTER — Ambulatory Visit (INDEPENDENT_AMBULATORY_CARE_PROVIDER_SITE_OTHER): Payer: 59 | Admitting: Sports Medicine

## 2013-03-28 VITALS — BP 116/78 | Ht 71.5 in | Wt 192.0 lb

## 2013-03-28 DIAGNOSIS — M5412 Radiculopathy, cervical region: Secondary | ICD-10-CM

## 2013-03-28 DIAGNOSIS — M542 Cervicalgia: Secondary | ICD-10-CM

## 2013-03-28 DIAGNOSIS — M5010 Cervical disc disorder with radiculopathy, unspecified cervical region: Secondary | ICD-10-CM

## 2013-03-28 NOTE — Progress Notes (Signed)
   Subjective:    Patient ID: Nathaniel LukesJeffrey A Fadden, male    DOB: 1965-08-17, 48 y.o.   MRN: [id number]  HPI Patient presents for followup on right arm cervical radiculopathy. I see him in lieu of Dr. Lazaro ArmsHudnall's absence. He has a history of a C5-C6 right-sided disc protrusion with a medial fragment which encroaches upon the C6 nerve root. He was initially seen in the office on December 9. After failure to improve with oral prednisone, Flexeril, and narcotics, a cervical ESI was ordered. Patient had the injection last week. Overall, he feels like his symptoms have improved by about 90%. Still some mild discomfort in the proximal lateral shoulder but the radiating discomfort that he was experiencing down the arm into his right thumb has resolved. He is concerned about hyperglycemia as a result of the injection and has been monitoring his blood sugars closely.    Review of Systems     Objective:   Physical Exam Well-developed, well-nourished. No acute distress. Awake alert and oriented x3  Good cervical range of motion. No spasm. Neurological exam shows a 1+/4 biceps reflex on the right compared to a 2/4 on the left. Triceps and brachial radialis reflexes are brisk and equal bilaterally. 4+/5 strength with resisted biceps flexion on the right compared to 5/5 on the left. Sensation remains intact to light-touch. No atrophy. Good radial and ulnar pulses.       Assessment & Plan:  Improving right arm cervical radiculopathy  Again, patient is 90% improved. I think he would benefit from some physical therapy at this point. He will continue to monitor his blood sugars and let his PCP know if he is concerned. I would like for him to followup one last time with Dr. Pearletha ForgeHudnall in 3 weeks. Call us with questions or concerns in the interim.

## 2013-04-03 ENCOUNTER — Ambulatory Visit: Payer: 59 | Attending: Family Medicine | Admitting: Rehabilitation

## 2013-04-03 ENCOUNTER — Other Ambulatory Visit: Payer: Self-pay | Admitting: Family Medicine

## 2013-04-03 DIAGNOSIS — R209 Unspecified disturbances of skin sensation: Secondary | ICD-10-CM | POA: Insufficient documentation

## 2013-04-03 DIAGNOSIS — IMO0001 Reserved for inherently not codable concepts without codable children: Secondary | ICD-10-CM | POA: Insufficient documentation

## 2013-04-03 DIAGNOSIS — R51 Headache: Secondary | ICD-10-CM | POA: Insufficient documentation

## 2013-04-03 DIAGNOSIS — M542 Cervicalgia: Secondary | ICD-10-CM | POA: Insufficient documentation

## 2013-04-03 DIAGNOSIS — E119 Type 2 diabetes mellitus without complications: Secondary | ICD-10-CM | POA: Insufficient documentation

## 2013-04-03 DIAGNOSIS — M25519 Pain in unspecified shoulder: Secondary | ICD-10-CM | POA: Insufficient documentation

## 2013-04-09 ENCOUNTER — Telehealth: Payer: Self-pay | Admitting: Physician Assistant

## 2013-04-09 DIAGNOSIS — F329 Major depressive disorder, single episode, unspecified: Secondary | ICD-10-CM

## 2013-04-09 DIAGNOSIS — E119 Type 2 diabetes mellitus without complications: Secondary | ICD-10-CM

## 2013-04-09 DIAGNOSIS — F419 Anxiety disorder, unspecified: Principal | ICD-10-CM

## 2013-04-09 DIAGNOSIS — F32A Depression, unspecified: Secondary | ICD-10-CM

## 2013-04-09 NOTE — Telephone Encounter (Signed)
Please advise 

## 2013-04-09 NOTE — Telephone Encounter (Signed)
Referral granted.

## 2013-04-09 NOTE — Telephone Encounter (Signed)
Patient is due for a follow-up.  I discourage the patient from increasing his medications on his own, without consulting our office.  This can be dangerous in some cases. The 20 mg he is taking is an acceptable daily dosage of the Lexapro.  However, 20 mg daily is the max dose. I will send in a refill for a 30-day supply of the 20 mg tablets.  I have pended the medication so the patient can select which pharmacy he would like the medication to be sent to. No further refills without an office visit.

## 2013-04-09 NOTE — Telephone Encounter (Signed)
Pt is requesting referral to see endocrinologist Dr Lafe GarinGherge

## 2013-04-09 NOTE — Telephone Encounter (Signed)
Left message at home # for pt to return my call. 

## 2013-04-09 NOTE — Telephone Encounter (Signed)
Patient left message on nurse voicemail requesting a refill of lexapro. Also, patient states that he increased the dosage to 20mg  himself and will be out tomorrow. He would like the new prescription of lexapro to be 20mg .

## 2013-04-10 ENCOUNTER — Ambulatory Visit: Payer: 59 | Admitting: Rehabilitation

## 2013-04-10 MED ORDER — ESCITALOPRAM OXALATE 20 MG PO TABS: ORAL_TABLET | ORAL | Status: DC

## 2013-04-10 NOTE — Telephone Encounter (Signed)
Pt came in to lab and was notified of below recommendation. Pt scheduled follow up within the next 30 days.

## 2013-04-10 NOTE — Telephone Encounter (Signed)
Returning call please call [phone]

## 2013-04-18 ENCOUNTER — Ambulatory Visit: Payer: 59 | Admitting: Family Medicine

## 2013-04-19 ENCOUNTER — Ambulatory Visit: Payer: 59 | Admitting: Internal Medicine

## 2013-04-24 ENCOUNTER — Ambulatory Visit: Payer: 59 | Admitting: Physician Assistant

## 2013-05-02 ENCOUNTER — Ambulatory Visit (INDEPENDENT_AMBULATORY_CARE_PROVIDER_SITE_OTHER): Payer: 59 | Admitting: Internal Medicine

## 2013-05-02 ENCOUNTER — Encounter: Payer: Self-pay | Admitting: Internal Medicine

## 2013-05-02 VITALS — BP 112/74 | HR 82 | Temp 98.1°F | Resp 12 | Ht 71.0 in | Wt 192.0 lb

## 2013-05-02 DIAGNOSIS — E1165 Type 2 diabetes mellitus with hyperglycemia: Principal | ICD-10-CM

## 2013-05-02 DIAGNOSIS — IMO0001 Reserved for inherently not codable concepts without codable children: Secondary | ICD-10-CM

## 2013-05-02 NOTE — Patient Instructions (Signed)
Please continue the Lantus 15 units 2x a day. Use the following insulin to carb ratio: 1:10. Use the following SSI: - 120-140: + 1 unit  - 141-160: + 2 units  - 161-180: + 3 units  - 181-200: + 4 units  - 201-240: + 5 units  - > 240: + 6 units   Please return in 1 month with your sugar log.   PATIENT INSTRUCTIONS FOR TYPE 2 DIABETES:  DIET AND EXERCISE Diet and exercise is an important part of diabetic treatment.  We recommended aerobic exercise in the form of brisk walking (working between 40-60% of maximal aerobic capacity, similar to brisk walking) for 150 minutes per week (such as 30 minutes five days per week) along with 3 times per week performing 'resistance' training (using various gauge rubber tubes with handles) 5-10 exercises involving the major muscle groups (upper body, lower body and core) performing 10-15 repetitions (or near fatigue) each exercise. Start at half the above goal but build slowly to reach the above goals. If limited by weight, joint pain, or disability, we recommend daily walking in a swimming pool with water up to waist to reduce pressure from joints while allow for adequate exercise.    BLOOD GLUCOSES Monitoring your blood glucoses is important for continued management of your diabetes. Please check your blood glucoses 2-4 times a day: fasting, before meals and at bedtime (you can rotate these measurements - e.g. one day check before the 3 meals, the next day check before 2 of the meals and before bedtime, etc.   HYPOGLYCEMIA (low blood sugar) Hypoglycemia is usually a reaction to not eating, exercising, or taking too much insulin/ other diabetes drugs.  Symptoms include tremors, sweating, hunger, confusion, headache, etc. Treat IMMEDIATELY with 15 grams of Carbs:   4 glucose tablets    cup regular juice/soda   2 tablespoons raisins   4 teaspoons sugar   1 tablespoon honey Recheck blood glucose in 15 mins and repeat above if still symptomatic/blood  glucose <100. Please contact our office at [phone] if you have questions about how to next handle your insulin.  RECOMMENDATIONS TO REDUCE YOUR RISK OF DIABETIC COMPLICATIONS: * Take your prescribed MEDICATION(S). * Follow a DIABETIC diet: Complex carbs, fiber rich foods, heart healthy fish twice weekly, (monounsaturated and polyunsaturated) fats * AVOID saturated/trans fats, high fat foods, >2,300 mg salt per day. * EXERCISE at least 5 times a week for 30 minutes or preferably daily.  * DO NOT SMOKE OR DRINK more than 1 drink a day. * Check your FEET every day. Do not wear tightfitting shoes. Contact us if you develop an ulcer * See your EYE doctor once a year or more if needed * Get a FLU shot once a year * Get a PNEUMONIA vaccine once before and once after age 29 years  GOALS:  * Your Hemoglobin A1c of <7%  * fasting sugars need to be <130 * after meals sugars need to be <180 (2h after you start eating) * Your Systolic BP should be 140 or lower  * Your Diastolic BP should be 80 or lower  * Your HDL (Good Cholesterol) should be 40 or higher  * Your LDL (Bad Cholesterol) should be 100 or lower  * Your Triglycerides should be 150 or lower  * Your Urine microalbumin (kidney function) should be <30 * Your Body Mass Index should be 25 or lower   We will be glad to help you achieve these goals. Our telephone  number is: [phone].  Basic Carbohydrate Counting Basic carbohydrate counting is a way to plan meals. It is done by counting the amount of carbohydrate in foods. Foods that have carbohydrates are starches (grains, beans, starchy vegetables) and sweets. Eating carbohydrates increases blood glucose (sugar) levels. People with diabetes use carbohydrate counting to help keep their blood glucose at a normal level.  COUNTING CARBOHYDRATES IN FOODS The first step in counting carbohydrates is to learn how many carbohydrate servings you should have in every meal. A dietitian can plan  this for you. After learning the amount of carbohydrates to include in your meal plan, you can start to choose the carbohydrate-containing foods you want to eat.  There are 2 ways to identify the amount of carbohydrates in the foods you eat.  Read the Nutrition Facts panel on food labels. You need 2 pieces of information from the Nutrition Facts panel to count carbohydrates this way:  Serving size.  Total carbohydrate (in grams). Decide how many servings you will be eating. If it is 1 serving, you will be eating the amount of carbohydrate listed on the panel. If you will be eating 2 servings, you will be eating double the amount of carbohydrate listed on the panel.   Learn serving sizes. A serving size of most carbohydrate-containing foods is about 15 grams (g). Listed below are single serving sizes of common carbohydrate-containing foods:  1 slice bread.   cup unsweetened, dry cereal.   cup hot cereal.   cup rice.   cup mashed potatoes.   cup pasta.  1 cup fresh fruit.   cup canned fruit.  1 cup milk (whole, 2%, or skim).   cup starchy vegetables (peas, corn, or potatoes). Counting carbohydrates this way is similar to looking on the Nutrition Facts panel. Decide how many servings you will eat first. Multiply the number of servings you eat by 15 g. For example, if you have 2 cups of strawberries, you had 2 servings. That means you had 30 g of carbohydrate (2 servings x 15 g = 30 g). CALCULATING CARBOHYDRATES IN A MEAL Sample dinner  3 oz chicken breast.   cup brown rice.   cup corn.  1 cup fat-free milk.  1 cup strawberries with sugar-free whipped topping. Carbohydrate calculation First, identify the foods that contain carbohydrate:  Rice.  Corn.  Milk.  Strawberries. Calculate the number of servings eaten:  2 servings rice.  1 serving corn.  1 serving milk.  1 serving strawberries. Multiply the number of servings by 15 g:  2 servings rice x 15 g  = 30 g.  1 serving corn x 15 g = 15 g.  1 serving milk x 15 g = 15 g.  1 serving strawberries x 15 g = 15 g. Add the amounts to find the total carbohydrates eaten: 30 g + 15 g + 15 g + 15 g = 75 g carbohydrate eaten at dinner. Document Released: 03/07/2005 Document Revised: 05/30/2011 Document Reviewed: 01/21/2011 [facility] Patient Information 2014 [place], [place].   Reading Food Labels Foods that are in packaging or containers will often have a Nutrition Facts panel on its side or back. The Nutrition Facts panel provides the nutritional value of the food. This information is helpful when determining healthy food choices. By reading food labels, you will find out the serving size of a food and how many servings the package has. You will also find information about the calorie and fat content, as well as the amount of  carbohydrate, and vitamins and minerals. Food labels are a great reference for you to use to learn about the food you are eating. BREAKING DOWN THE FOOD LABEL Serving Size: The serving size is an amount of food and is often listed in cups, weight, or units. All of the nutrition information about the food is listed according to the serving size. If you double the serving size, you must double the amounts on the label.  Servings per [employer] or Package: The number of servings in the container is listed here.  Calories: The number of calories in one serving is listed here. Everyone needs a different amount of calories each day. Having calories listed on the label is helpful information for people who would like to keep track of the number of calories they eat to stay at a healthy weight. Calories from Fat:  The number of calories that come from fat in one serving are listed here.  NUTRIENTS THAT ARE LISTED ON THE FOOD LABEL.   Percent Daily Value: The food label helps you know if you are getting the amounts of nutrients you need each day by the percent daily value. It tells you how  much of your daily values of each nutrient are provided by one serving of the food. The percent daily value is based on a 2000 calorie diet. You may need more or less than 2000 calories each day.  Total Fat: The total amount of fat in one serving is listed here. The number is shown in grams (g). This information is important for people who want to keep track of the amount of fat in their diet. Foods with high amounts of fat usually have higher calories and may lead to weight gain.  Saturated Fat:  The amount of saturated fat in one serving is listed here. It is also shown in grams. Saturated fat is one type of fat that is found in food. It increases the amount of blood cholesterol more than other types of fat found in food. So saturated fat should be limited in the diet to less than 7 percent of total calories each day for most people. This means that if a person eats 2000 calories each day, they should eat less than 140 calories from saturated fat.  Trans Fat: The amount of trans fat in one serving is listed here. It is also shown in grams. Trans fat is another type of fat that is found in food. It should also be limited to less than 2 grams per day because it increases blood cholesterol.  Cholesterol: The amount of cholesterol in one serving is listed here. It is shown in milligrams (mg). Cholesterol should be limited to no more than 200 mg each day.  Sodium: The amount of sodium in one serving is listed here. It is shown in milligrams. American Heart Association recommends that sodium should be limited to 1500mg /day. This recommended level of sodium was recently lowered from 2400mg /day.  Total Carbohydrate: The amount of carbohydrate in one serving is listed here. It is shown in grams. This information is important for people with diabetes because they need to manage the amount of carbohydrate they eat. Carbohydrate changes the amount of glucose or sugar in the blood and diabetics do not want that  amount to be too high or too low.  Dietary Fiber:  The amount of dietary fiber in one serving is listed here. It is shown in grams. Fiber is a type of carbohydrate. Most people should eat 25  grams of dietary fiber each day.  Sugars: The amount of sugar in one serving is listed here. It is shown in grams. Sugars are also a type of carbohydrate. This value includes both naturally occurring sugars from fruit and milk and added sugars such as honey or table sugar.  Protein: The amount of protein in one serving is listed here. It is shown in grams.  Vitamins and Minerals: Food labels list vitamin A, vitamin C, calcium and iron. They are all shown as a percent of the daily need one serving of the food provides. For example, if 15% is listed next to iron it means that one serving of that food will give you 15% of the total amount of iron you need for one day.  Calories per Gram: Some food labels will list the number of calories that are in each gram or protein, carbohydrate and fat. Protein has four calories per gram, carbohydrate has four calories per gram, and fat has 9 calories per gram.  Ingredients: Food labels will list each ingredient in the food. The first ingredient listed is the ingredient that the food has the most of. The ingredients are listed in the order of their amount from highest to lowest.  Contains: Food labels may also include this portion of the label as a food allergen warning. Listed here are ingredients that can cause allergies in some people. Examples of ingredients that are listed are wheat, dairy, eggs, soy and nuts. If a person knows that are allergic to one of these ingredients they will know not to eat the food in the container. Information from www.eatright.[place] Servant Nutritional Analysis Database, ADA Nutrition Care Manual. Document Released: 03/07/2005 Document Revised: 05/30/2011 Document Reviewed: 07/21/2008 [facility] Patient Information 2014 [place], [place].

## 2013-05-02 NOTE — Progress Notes (Signed)
Patient ID: Nathaniel [name], male   DOB: 1966-01-31, 48 y.o.   MRN: [id number]  HPI: Nathaniel [name] is a 48 y.o.-year-old male, referred by his PCP, Leeanne Rio, PA-C, for management of DM2, insulin-dependent, uncontrolled, without complications.  Patient has been diagnosed with diabetes in 2001; he started insulin 2010.  Last hemoglobin A1c was: 04/11/2013: 9.8% Lab Results  Component Value Date   HGBA1C 9.5* 12/19/2012   HGBA1C 9.7* 02/17/2012   HGBA1C 7.6* 08/29/2011  He had steroid injections for neck pain 02/2013-03/2013.  Pt is on a regimen of: - Metformin 1000 mg po bid - Lantus 15 units bid (<< once a day as he was dropping his sugars at night) - Humalog 5-10 units tid ac  Pt checks his sugars 3-4 a day and they are (no log, but has one at home): - am: 220s when on steroids, now 80-150 - 2h after b'fast: n/c - before lunch: 80-240 (misjudging the Humalog) - 2h after lunch: n/c - before dinner: 250s (100s-300s) - 2h after dinner: n/c - bedtime: 160-180 - nighttime: checks if feels low: 50-60s once every 2 weeks  No lows. Lowest sugar was 47 - after a walk; he has hypoglycemia awareness at 70.  Highest sugar was 300s.  Pt's meals are: - Breakfast: cereal + 1% milk, with protein bar - Lunch: sandwich - Dinner: eating out or eating in - Snacks: 1-2 a day He has a low appetite. Walks 3-4x a week. Started a new job 3 weeks ago >> travels a lot.  - no CKD, last BUN/creatinine:  Lab Results  Component Value Date   BUN 9 12/19/2012   CREATININE 0.95 12/19/2012  He is on Lisinopril. - last set of lipids: Lab Results  Component Value Date   CHOL 168 05/26/2011   HDL 58 05/26/2011   LDLCALC 96 05/26/2011   TRIG 70 05/26/2011   CHOLHDL 2.9 05/26/2011  - last eye exam was in 06/2012. No DR.  - no numbness and tingling in his feet.  Pt was adopted >> ? FH of DM, but Type 1 DM in son.  ROS: Constitutional: no weight gain/loss, + fatigue, no subjective  hyperthermia/hypothermia, + poor sleep Eyes: no blurry vision, no xerophthalmia ENT: no sore throat, no nodules palpated in throat, no dysphagia/odynophagia, no hoarseness Cardiovascular: no CP/SOB/palpitations/leg swelling Respiratory: no cough/SOB Gastrointestinal: no N/V/D/C Musculoskeletal: no muscle/joint aches Skin: no rashes Neurological: no tremors/numbness/tingling/dizziness Psychiatric: + depression/no anxiety  Past Medical History  Diagnosis Date  . Depression   . Hyperlipidemia   . Hypogonadism male   . Headache(784.0)   . Diabetes mellitus     type II  . GERD (gastroesophageal reflux disease)    Past Surgical History  Procedure Laterality Date  . Tonsillectomy  1972  . Nasal sinus surgery  04/1991   History   Social History  . Marital Status: Married    Spouse Name: [name]    Number of Children: 2  . Years of Education: N/A   Occupational History  .      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 Smoking status: Never Smoker   . Smokeless tobacco: Never Used  . Alcohol Use: No  . Drug Use: No  . Sexual Activity: Not on file   Other Topics Concern  . 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   Current Outpatient Prescriptions  on File Prior to Visit  Medication Sig Dispense Refill  . Blood Glucose Monitoring Suppl (ONE TOUCH ULTRA 2) W/DEVICE KIT Use as instructed to check blood sugar three times daily  Dx 250.02  1 each  0  . cyclobenzaprine (FLEXERIL) 5 MG tablet Take 1 tablet (5 mg total) by mouth 3 (three) times daily as needed for muscle spasms.  30 tablet  1  . escitalopram (LEXAPRO) 20 MG tablet TAKE 1 TABLET BY MOUTH ONCE DAILY  30 tablet  0  . glucagon (GLUCAGON EMERGENCY) 1 MG injection Inject 1 mg into the muscle once as needed.  1 each  3  . glucose blood (ONE TOUCH ULTRA TEST) test strip Use as instructed to check blood sugar three times  daily  Dx 250.02  300 each  6  . HUMALOG KWIKPEN 100 UNIT/ML injection inject 5 to 10 units with meals  15 mL  3  . Insulin Glargine 100 UNIT/ML SOPN Inject 13 Units into the skin 2 (two) times daily. Morning & Night.      . Lancets (ONETOUCH ULTRASOFT) lancets Use as instructed to check blood sugar three times daily  Dx 250.02  300 each  6  . lisinopril (PRINIVIL,ZESTRIL) 2.5 MG tablet TAKE 1 TABLET BY MOUTH ONCE DAILY  30 tablet  2  . metFORMIN (GLUCOPHAGE) 1000 MG tablet TAKE 1 TABLET BY MOUTH TWICE DAILY WITH FOOD  60 tablet  2  . omeprazole (PRILOSEC) 40 MG capsule Take 1 capsule (40 mg total) by mouth daily.  30 capsule  5  . oxyCODONE-acetaminophen (PERCOCET/ROXICET) 5-325 MG per tablet Take 1 tablet by mouth every 6 (six) hours as needed for severe pain.      Marland Kitchen oxyCODONE-acetaminophen (ROXICET) 5-325 MG per tablet Take 1 tablet by mouth every 6 (six) hours as needed for severe pain.  40 tablet  0  . predniSONE (STERAPRED UNI-PAK) 10 MG tablet 6 tabs po day 1, 5 tabs po day 2, 4 tabs po day 3, 3 tabs po day 4, 2 tabs po day 5, 1 tab po day 6  21 tablet  0   No current facility-administered medications on file prior to visit.   Allergies  Allergen Reactions  . Eggs Or Egg-Derived Products     childhood  . Reglan [Metoclopramide] Other (See Comments)    Jittery, increased anxiety  . Pioglitazone Other (See Comments)      headache  . Promethazine Hcl Nausea And Vomiting        Family History  Problem Relation Age of Onset  . Adopted: Yes  . Cancer Father 57    Prostate cancer    PE: BP 112/74  Pulse 82  Temp(Src) 98.1 F (36.7 C) (Oral)  Resp 12  Ht 5' 11"  (1.803 m)  Wt 192 lb (87.091 kg)  BMI 26.79 kg/m2  SpO2 97% Wt Readings from Last 3 Encounters:  05/02/13 192 lb (87.091 kg)  03/28/13 192 lb (87.091 kg)  03/06/13 192 lb (87.091 kg)   Constitutional: normal weight, in NAD, appears depressed Eyes: PERRLA, EOMI, no exophthalmos ENT: moist mucous membranes, no  thyromegaly, no cervical lymphadenopathy Cardiovascular: RRR, No MRG Respiratory: CTA B Gastrointestinal: abdomen soft, NT, ND, BS+ Musculoskeletal: no deformities, strength intact in all 4 Skin: moist, warm, no rashes Neurological: no tremor with outstretched hands, DTR normal in all 4  ASSESSMENT: 1. DM2, insulin-dependent, uncontrolled, without complications  PLAN:  1. Patient with long-standing, uncontrolled diabetes, on basal-bolus insulin, with Metformin. His sugars are  variable, increasing throughout the day, a sign of not enough insulin with meals. - We discussed about options for treatment, and I suggested to:  Patient Instructions  Please continue the Lantus 15 units 2x a day. Use the following insulin to carb ratio: 1:10. Use the following SSI: - 120-140: + 1 unit  - 141-160: + 2 units  - 161-180: + 3 units  - 181-200: + 4 units  - 201-240: + 5 units  - > 240: + 6 units  - continue checking sugars at different times of the day - check 3-4 times a day, rotating checks - given instructions about carb counting and food label reading - given carb counting book - discussed the need for packing healthy meals from home and not skipping meals - given sugar log and advised how to fill it and to bring it at next appt  - given foot care handout and explained the principles  - given instructions for hypoglycemia management "15-15 rule"  - advised for yearly eye exams - he is up to date - Return to clinic in 1 mo with sugar log

## 2013-05-15 ENCOUNTER — Encounter: Payer: Self-pay | Admitting: Physician Assistant

## 2013-05-15 ENCOUNTER — Ambulatory Visit: Payer: 59 | Admitting: Physician Assistant

## 2013-05-15 ENCOUNTER — Ambulatory Visit (INDEPENDENT_AMBULATORY_CARE_PROVIDER_SITE_OTHER): Payer: 59 | Admitting: Physician Assistant

## 2013-05-15 VITALS — BP 98/72 | HR 91 | Temp 97.7°F | Resp 16 | Ht 71.0 in | Wt 190.5 lb

## 2013-05-15 DIAGNOSIS — F329 Major depressive disorder, single episode, unspecified: Secondary | ICD-10-CM

## 2013-05-15 DIAGNOSIS — F3289 Other specified depressive episodes: Secondary | ICD-10-CM

## 2013-05-15 DIAGNOSIS — F32A Depression, unspecified: Secondary | ICD-10-CM

## 2013-05-15 MED ORDER — ESCITALOPRAM OXALATE 10 MG PO TABS: ORAL_TABLET | ORAL | Status: DC

## 2013-05-15 NOTE — Progress Notes (Signed)
Pre visit review using our clinic review tool, if applicable. No additional management support is needed unless otherwise documented below in the visit note/SLS  

## 2013-05-15 NOTE — Progress Notes (Signed)
Patient presents to clinic today for follow-up of anxiety and depression.  Depression/Anxiety: Patient states he is doing very well.  Is only taking 10 mg Lexapro daily.  States he has been in a very good mood since he got a new job.  Endorses significant decrease in stress and anxiety levels.  Is working out several times per week.  Denies suicidal thought or ideation.  Patient wishes to discontinue the Lexapro and would like instructions on the best way to do this.  Past Medical History  Diagnosis Date  . Depression   . Hyperlipidemia   . Hypogonadism male   . Headache(784.0)   . Diabetes mellitus     type II  . GERD (gastroesophageal reflux disease)     Current Outpatient Prescriptions on File Prior to Visit  Medication Sig Dispense Refill  . Blood Glucose Monitoring Suppl (ONE TOUCH ULTRA 2) W/DEVICE KIT Use as instructed to check blood sugar three times daily  Dx 250.02  1 each  0  . glucagon (GLUCAGON EMERGENCY) 1 MG injection Inject 1 mg into the muscle once as needed.  1 each  3  . glucose blood (ONE TOUCH ULTRA TEST) test strip Use as instructed to check blood sugar three times daily  Dx 250.02  300 each  6  . HUMALOG KWIKPEN 100 UNIT/ML injection inject 5 to 10 units with meals  15 mL  3  . Insulin Glargine 100 UNIT/ML SOPN Inject 13 Units into the skin 2 (two) times daily. Morning & Night.      . Lancets (ONETOUCH ULTRASOFT) lancets Use as instructed to check blood sugar three times daily  Dx 250.02  300 each  6  . lisinopril (PRINIVIL,ZESTRIL) 2.5 MG tablet TAKE 1 TABLET BY MOUTH ONCE DAILY  30 tablet  2  . metFORMIN (GLUCOPHAGE) 1000 MG tablet TAKE 1 TABLET BY MOUTH TWICE DAILY WITH FOOD  60 tablet  2  . omeprazole (PRILOSEC) 40 MG capsule Take 1 capsule (40 mg total) by mouth daily.  30 capsule  5   No current facility-administered medications on file prior to visit.    Allergies  Allergen Reactions  . Eggs Or Egg-Derived Products     childhood  . Reglan  [Metoclopramide] Other (See Comments)    Jittery, increased anxiety  . Pioglitazone Other (See Comments)      headache  . Promethazine Hcl Nausea And Vomiting         Family History  Problem Relation Age of Onset  . Adopted: Yes  . Cancer Father 40    Prostate cancer    History   Social History  . Marital Status: Married    Spouse Name: [name]    Number of Children: 2  . Years of Education: N/A   Occupational History  .      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 Smoking status: Never Smoker   . Smokeless tobacco: Never Used  . Alcohol Use: No  . Drug Use: No  . Sexual Activity: None   Other Topics Concern  . 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    Review of Systems - See HPI.  All other ROS are negative.  BP 98/72  Pulse 91  Temp(Src) 97.7 F (36.5 C) (Oral)  Resp 16  Ht _0  (1.803 m)  Wt 190 lb 8 oz (86.41 kg)  BMI 26.58 kg/m2  SpO2 98%  Physical Exam  Vitals reviewed. Constitutional: He is oriented to person, place, and time and well-developed, well-nourished, and in no distress.  HENT:  Head: Normocephalic.  Eyes: Conjunctivae are normal. Pupils are equal, round, and reactive to light.  Neck: Neck supple.  Cardiovascular: Normal rate, regular rhythm and normal heart sounds.   Pulmonary/Chest: Effort normal and breath sounds normal. No respiratory distress. He has no wheezes. He has no rales. He exhibits no tenderness.  Neurological: He is alert and oriented to person, place, and time.  Skin: Skin is warm and dry. No rash noted.  Psychiatric: Affect normal.  Patient is much more animated than during previous visits, despite lower dose of medication.  Patient is smiling throughout the interview and examination.   Assessment/Plan: DEPRESSION Patient wishes to discontinue Lexapro.  Discussed pros and cons of this course of action.  Will attempt to  discontinue medication.  Instructed patient to take 10 mg every other day for 2 weeks.  Then will decrease to 5 mg every other day for 2 weeks. Follow-up in 1 month.  Patient is aware of possibility of worsening mood during trial off of medication.  Educated on alarm signs/symptoms.  Patient voices understanding.

## 2013-05-15 NOTE — Patient Instructions (Signed)
Please take 1 tablet (10 mg) every other day for 2 weeks.  Then take 1/2 tablet (5 mg) daily for 2 weeks.  Please follow-up in 1 month.  If you develop worsening depression or suicidal thoughts/ideations, please call the clinic immediately.

## 2013-05-17 NOTE — Assessment & Plan Note (Signed)
Patient wishes to discontinue Lexapro.  Discussed pros and cons of this course of action.  Will attempt to discontinue medication.  Instructed patient to take 10 mg every other day for 2 weeks.  Then will decrease to 5 mg every other day for 2 weeks. Follow-up in 1 month.  Patient is aware of possibility of worsening mood during trial off of medication.  Educated on alarm signs/symptoms.  Patient voices understanding.

## 2013-05-20 ENCOUNTER — Emergency Department (HOSPITAL_BASED_OUTPATIENT_CLINIC_OR_DEPARTMENT_OTHER)
Admission: EM | Admit: 2013-05-20 | Discharge: 2013-05-21 | Disposition: A | Discharge status: Home / Self Care | Payer: 59 | Attending: Emergency Medicine | Admitting: Emergency Medicine

## 2013-05-20 ENCOUNTER — Encounter (HOSPITAL_BASED_OUTPATIENT_CLINIC_OR_DEPARTMENT_OTHER): Payer: Self-pay | Admitting: Emergency Medicine

## 2013-05-20 DIAGNOSIS — R112 Nausea with vomiting, unspecified: Secondary | ICD-10-CM | POA: Insufficient documentation

## 2013-05-20 DIAGNOSIS — E119 Type 2 diabetes mellitus without complications: Secondary | ICD-10-CM | POA: Insufficient documentation

## 2013-05-20 DIAGNOSIS — Z794 Long term (current) use of insulin: Secondary | ICD-10-CM | POA: Insufficient documentation

## 2013-05-20 DIAGNOSIS — K219 Gastro-esophageal reflux disease without esophagitis: Secondary | ICD-10-CM | POA: Insufficient documentation

## 2013-05-20 DIAGNOSIS — F329 Major depressive disorder, single episode, unspecified: Secondary | ICD-10-CM | POA: Insufficient documentation

## 2013-05-20 DIAGNOSIS — F3289 Other specified depressive episodes: Secondary | ICD-10-CM | POA: Insufficient documentation

## 2013-05-20 DIAGNOSIS — R1110 Vomiting, unspecified: Secondary | ICD-10-CM

## 2013-05-20 DIAGNOSIS — Z79899 Other long term (current) drug therapy: Secondary | ICD-10-CM | POA: Insufficient documentation

## 2013-05-20 LAB — COMPREHENSIVE METABOLIC PANEL
ALT: 30 U/L (ref 0–53)
AST: 40 U/L — ABNORMAL HIGH (ref 0–37)
Albumin: 4.3 g/dL (ref 3.5–5.2)
Alkaline Phosphatase: 61 U/L (ref 39–117)
BUN: 12 mg/dL (ref 6–23)
CO2: 26 mEq/L (ref 19–32)
Calcium: 9.8 mg/dL (ref 8.4–10.5)
Chloride: 96 mEq/L (ref 96–112)
Creatinine, Ser: 0.9 mg/dL (ref 0.50–1.35)
GFR calc Af Amer: >90 mL/min (ref >90)
GFR calc non Af Amer: >90 mL/min (ref >90)
Glucose, Bld: 237 mg/dL — ABNORMAL HIGH (ref 70–99)
Potassium: 4.2 mEq/L (ref 3.7–5.3)
Sodium: 137 mEq/L (ref 137–147)
Total Bilirubin: 1.2 mg/dL (ref 0.3–1.2)
Total Protein: 7.6 g/dL (ref 6.0–8.3)

## 2013-05-20 LAB — URINALYSIS, ROUTINE W REFLEX MICROSCOPIC
Bilirubin Urine: NEGATIVE
Glucose, UA: >1000 mg/dL — AB
Hgb urine dipstick: NEGATIVE
Ketones, ur: 15 mg/dL — AB
Leukocytes, UA: NEGATIVE
Nitrite: NEGATIVE
Protein, ur: NEGATIVE mg/dL
Specific Gravity, Urine: 1.017 (ref 1.005–1.030)
Urobilinogen, UA: 0.2 mg/dL (ref 0.0–1.0)
pH: 6 (ref 5.0–8.0)

## 2013-05-20 LAB — CBC WITH DIFFERENTIAL/PLATELET
Basophils Absolute: 0 10*3/uL (ref 0.0–0.1)
Basophils Relative: 0 % (ref 0–1)
Eosinophils Absolute: 0 10*3/uL (ref 0.0–0.7)
Eosinophils Relative: 0 % (ref 0–5)
HCT: 42.4 % (ref 39.0–52.0)
Hemoglobin: 13.8 g/dL (ref 13.0–17.0)
Lymphocytes Relative: 22 % (ref 12–46)
Lymphs Abs: 1.5 10*3/uL (ref 0.7–4.0)
MCH: 28.5 pg (ref 26.0–34.0)
MCHC: 32.5 g/dL (ref 30.0–36.0)
MCV: 87.4 fL (ref 78.0–100.0)
Monocytes Absolute: 0.6 10*3/uL (ref 0.1–1.0)
Monocytes Relative: 9 % (ref 3–12)
Neutro Abs: 4.8 10*3/uL (ref 1.7–7.7)
Neutrophils Relative %: 69 % (ref 43–77)
Platelets: 219 10*3/uL (ref 150–400)
RBC: 4.85 MIL/uL (ref 4.22–5.81)
RDW: 12.4 % (ref 11.5–15.5)
WBC: 7 10*3/uL (ref 4.0–10.5)

## 2013-05-20 LAB — CBG MONITORING, ED: Glucose-Capillary: 224 mg/dL — ABNORMAL HIGH (ref 70–99)

## 2013-05-20 LAB — URINE MICROSCOPIC-ADD ON

## 2013-05-20 MED ORDER — ONDANSETRON HCL 4 MG/2ML IJ SOLN
4.0000 mg | Freq: Once | INTRAMUSCULAR | Status: AC
  Administered 2013-05-20: 4 mg via INTRAVENOUS
  Filled 2013-05-20: qty 2

## 2013-05-20 MED ORDER — SODIUM CHLORIDE 0.9 % IV BOLUS (SEPSIS)
1000.0000 mL | Freq: Once | INTRAVENOUS | Status: AC
  Administered 2013-05-20: 1000 mL via INTRAVENOUS

## 2013-05-20 NOTE — ED Provider Notes (Signed)
CSN: [id number]     Arrival date & time 05/20/13  2104 History  This chart was scribed for Sabryn Preslar B. Karle Starch, MD by Marcha Dutton, ED Scribe. This patient was seen in room MH02/MH02 and the patient's care was started at 10:57 PM.     Chief Complaint  Patient presents with  . Emesis    Patient is a 48 y.o. male presenting with vomiting.  Emesis  HPI Comments: Nathaniel [name] is a 48 y.o. male with a h/o DM and HLD who presents to the Emergency Department complaining of 3 episodes of emesis with associated nausea that began this afternoon around 12:30 PM the most recent being around 9 PM. He states he drank some apple juice around 6 o'clock and was primarily concerned with hydration. Pt states he has had upper abdominal pain over the past 8 to 10 hours. Pt denies fever.  PCP is Dr. Hassell Done  Past Medical History  Diagnosis Date  . Depression   . Hyperlipidemia   . Hypogonadism male   . Headache(784.0)   . Diabetes mellitus     type II  . GERD (gastroesophageal reflux disease)    Past Surgical History  Procedure Laterality Date  . Tonsillectomy  1972  . Nasal sinus surgery  04/1991   Family History  Problem Relation Age of Onset  . Adopted: Yes  . Cancer Father 14    Prostate cancer   History  Substance Use Topics  . Smoking status: Never Smoker   . Smokeless tobacco: Never Used  . Alcohol Use: No    Review of Systems  Gastrointestinal: Positive for vomiting.  A complete 10 system review of systems was obtained and all systems are negative except as noted in the HPI and PMH.      Allergies  Eggs or egg-derived products; Reglan; Pioglitazone; and Promethazine hcl  Home Medications   Current Outpatient Rx  Name  Route  Sig  Dispense  Refill  . Blood Glucose Monitoring Suppl (ONE TOUCH ULTRA 2) W/DEVICE KIT      Use as instructed to check blood sugar three times daily  Dx 250.02   1 each   0   . escitalopram (LEXAPRO) 10 MG tablet      5 mg. Take 1 tablet  every other day for 2 weeks.  Then take 1/2 tablet every other day for two weeks to wean off the medication         . glucagon (GLUCAGON EMERGENCY) 1 MG injection   Intramuscular   Inject 1 mg into the muscle once as needed.   1 each   3   . glucose blood (ONE TOUCH ULTRA TEST) test strip      Use as instructed to check blood sugar three times daily  Dx 250.02   300 each   6     Patient needs test strips for TrueResult Meter.  C ...   . HUMALOG KWIKPEN 100 UNIT/ML injection      inject 5 to 10 units with meals   15 mL   3   . Insulin Glargine 100 UNIT/ML SOPN   Subcutaneous   Inject 13 Units into the skin 2 (two) times daily. Morning & Night.         . Lancets (ONETOUCH ULTRASOFT) lancets      Use as instructed to check blood sugar three times daily  Dx 250.02   300 each   6   . lisinopril (PRINIVIL,ZESTRIL) 2.5 MG tablet  TAKE 1 TABLET BY MOUTH ONCE DAILY   30 tablet   2   . metFORMIN (GLUCOPHAGE) 1000 MG tablet      TAKE 1 TABLET BY MOUTH TWICE DAILY WITH FOOD   60 tablet   2   . omeprazole (PRILOSEC) 40 MG capsule   Oral   Take 1 capsule (40 mg total) by mouth daily.   30 capsule   5    Triage Vitals: BP 108/75  Pulse 94  Temp(Src) 98.2 F (36.8 C) (Oral)  Resp 16  Ht 5' 11"  (1.803 m)  Wt 190 lb (86.183 kg)  BMI 26.51 kg/m2  SpO2 99%  Physical Exam  Nursing note and vitals reviewed. Constitutional: He is oriented to person, place, and time. He appears well-developed and well-nourished.  HENT:  Head: Normocephalic and atraumatic.  Eyes: EOM are normal. Pupils are equal, round, and reactive to light.  Neck: Normal range of motion. Neck supple.  Cardiovascular: Normal rate, normal heart sounds and intact distal pulses.   Pulmonary/Chest: Effort normal and breath sounds normal.  Abdominal: Bowel sounds are normal. He exhibits no distension. There is no tenderness.  Musculoskeletal: Normal range of motion. He exhibits no edema and no  tenderness.  Neurological: He is alert and oriented to person, place, and time. He has normal strength. No cranial nerve deficit or sensory deficit.  Skin: Skin is warm and dry. No rash noted.  Psychiatric: He has a normal mood and affect.    ED Course  Procedures (including critical care time) DIAGNOSTIC STUDIES: Oxygen Saturation is 99% on RA, normal by my interpretation.    COORDINATION OF CARE: 11:01 PM- Pt advised of plan for treatment and pt agrees.    Labs Review Labs Reviewed  URINALYSIS, ROUTINE W REFLEX MICROSCOPIC - Abnormal; Notable for the following:    Glucose, UA >1000 (*)    Ketones, ur 15 (*)    All other components within normal limits  COMPREHENSIVE METABOLIC PANEL - Abnormal; Notable for the following:    Glucose, Bld 237 (*)    AST 40 (*)    All other components within normal limits  CBG MONITORING, ED - Abnormal; Notable for the following:    Glucose-Capillary 224 (*)    All other components within normal limits  URINE MICROSCOPIC-ADD ON  CBC WITH DIFFERENTIAL   Imaging Review No results found.   EKG Interpretation None      MDM   Final diagnoses:  Vomiting    Labs reviewed, mild hyperglycemia but no signs of DKA. Feeling better after fluids and zofran. PCP followup as needed.  I personally performed the services described in this documentation, which was scribed in my presence. The recorded information has been reviewed and is accurate.       Farah Lepak B. Karle Starch, MD 05/21/13 [phone]

## 2013-05-20 NOTE — ED Notes (Signed)
Vomiting all day. Hx of diabetes.

## 2013-05-21 MED ORDER — ONDANSETRON HCL 4 MG PO TABS: 4.0000 mg | ORAL_TABLET | Freq: Three times a day (TID) | ORAL | Status: DC | PRN

## 2013-05-21 NOTE — Discharge Instructions (Signed)

## 2013-06-11 ENCOUNTER — Ambulatory Visit (INDEPENDENT_AMBULATORY_CARE_PROVIDER_SITE_OTHER): Payer: Self-pay | Admitting: Family Medicine

## 2013-06-11 ENCOUNTER — Telehealth: Payer: Self-pay | Admitting: Physician Assistant

## 2013-06-11 VITALS — BP 121/63 | HR 74 | Ht 71.5 in | Wt 190.0 lb

## 2013-06-11 DIAGNOSIS — E119 Type 2 diabetes mellitus without complications: Secondary | ICD-10-CM

## 2013-06-11 DIAGNOSIS — E785 Hyperlipidemia, unspecified: Secondary | ICD-10-CM

## 2013-06-11 NOTE — Progress Notes (Signed)
Patient presents today for DM follow-up as part of employer-sponsored [employer] [facility] [employer] [facility]. Medications, glucose readings, a1c and compliance have been reviewed. I have also discussed with patient lifestyle interventions including diet and exercise. Details of the visit can be found in [facility] documenting program through [facility] Patient has set a series of personal goals and will follow-up in no more than 3 months for further review of DM.  Today's a1c (8.5%) is lower than previous. For the past 14 day average, his AM fasting readings have been 133, lunch 183 and dinner 188. Due to the increasing trend, I have recommended he contact Dr. Wyonia HoughGerghe about potentially increasing his Humalog dosing related to carbohydrates.

## 2013-06-11 NOTE — Telephone Encounter (Signed)
Order has been placed.

## 2013-06-11 NOTE — Telephone Encounter (Signed)
[facility] CALLED AND WANTS YOU TO RUN LIPIDS ON THIS PATIENT

## 2013-06-12 ENCOUNTER — Encounter: Payer: Self-pay | Admitting: Physician Assistant

## 2013-06-12 ENCOUNTER — Ambulatory Visit (INDEPENDENT_AMBULATORY_CARE_PROVIDER_SITE_OTHER): Payer: 59 | Admitting: Physician Assistant

## 2013-06-12 VITALS — BP 118/78 | HR 66 | Temp 98.3°F | Wt 190.0 lb

## 2013-06-12 DIAGNOSIS — F329 Major depressive disorder, single episode, unspecified: Secondary | ICD-10-CM

## 2013-06-12 DIAGNOSIS — E1165 Type 2 diabetes mellitus with hyperglycemia: Principal | ICD-10-CM

## 2013-06-12 DIAGNOSIS — IMO0001 Reserved for inherently not codable concepts without codable children: Secondary | ICD-10-CM

## 2013-06-12 DIAGNOSIS — E785 Hyperlipidemia, unspecified: Secondary | ICD-10-CM

## 2013-06-12 DIAGNOSIS — F3289 Other specified depressive episodes: Secondary | ICD-10-CM

## 2013-06-12 LAB — LIPID PANEL
Cholesterol: 181 mg/dL (ref 0–200)
HDL: 65 mg/dL (ref >39)
LDL Cholesterol: 103 mg/dL — ABNORMAL HIGH (ref 0–99)
Total CHOL/HDL Ratio: 2.8 Ratio
Triglycerides: 66 mg/dL (ref <150)
VLDL: 13 mg/dL (ref 0–40)

## 2013-06-12 NOTE — Assessment & Plan Note (Signed)
Doing very well off medication.  Will continue to monitor symptoms.  Follow-up in 3 months.

## 2013-06-12 NOTE — Progress Notes (Signed)
Patient presents to clinic today for follow-up of depression/anxiety.  At [place] visit patient had expressed desire to discontinue medications as his symptoms were much improved after starting a new job.  Patient endorses being completely off of medication for a few weeks after weaning off meds as directed.  States he is doing well.  Still loves his job.  Denies depressed mood, anhedonia, SI/HI.  Patient recently saw his endocrinologist who states his A1C was ~ 8, an improvement from previous check. Patient continues medications as directed and is still seeing a diabetic nutritionist.  Needs lipid panel drawn.  Past Medical History  Diagnosis Date  . Depression   . Hyperlipidemia   . Hypogonadism male   . Headache(784.0)   . Diabetes mellitus     type II  . GERD (gastroesophageal reflux disease)     Current Outpatient Prescriptions on File Prior to Visit  Medication Sig Dispense Refill  . Blood Glucose Monitoring Suppl (ONE TOUCH ULTRA 2) W/DEVICE KIT Use as instructed to check blood sugar three times daily  Dx 250.02  1 each  0  . glucagon (GLUCAGON EMERGENCY) 1 MG injection Inject 1 mg into the muscle once as needed.  1 each  3  . glucose blood (ONE TOUCH ULTRA TEST) test strip Use as instructed to check blood sugar three times daily  Dx 250.02  300 each  6  . HUMALOG KWIKPEN 100 UNIT/ML injection inject 5 to 10 units with meals  15 mL  3  . Insulin Glargine 100 UNIT/ML SOPN Inject 13 Units into the skin 2 (two) times daily. Morning & Night.      . Lancets (ONETOUCH ULTRASOFT) lancets Use as instructed to check blood sugar three times daily  Dx 250.02  300 each  6  . lisinopril (PRINIVIL,ZESTRIL) 2.5 MG tablet TAKE 1 TABLET BY MOUTH ONCE DAILY  30 tablet  2  . metFORMIN (GLUCOPHAGE) 1000 MG tablet TAKE 1 TABLET BY MOUTH TWICE DAILY WITH FOOD  60 tablet  2  . omeprazole (PRILOSEC) 40 MG capsule Take 1 capsule (40 mg total) by mouth daily.  30 capsule  5  . ondansetron (ZOFRAN) 4 MG tablet Take  1 tablet (4 mg total) by mouth every 8 (eight) hours as needed for nausea or vomiting.  12 tablet  0  . escitalopram (LEXAPRO) 10 MG tablet 5 mg. Take 1 tablet every other day for 2 weeks.  Then take 1/2 tablet every other day for two weeks to wean off the medication       No current facility-administered medications on file prior to visit.    Allergies  Allergen Reactions  . Eggs Or Egg-Derived Products     childhood  . Reglan [Metoclopramide] Other (See Comments)    Jittery, increased anxiety  . Pioglitazone Other (See Comments)      headache  . Promethazine Hcl Nausea And Vomiting         Family History  Problem Relation Age of Onset  . Adopted: Yes  . Cancer Father 72    Prostate cancer    History   Social History  . Marital Status: Married    Spouse Name: [name]    Number of Children: 2  . Years of Education: N/A   Occupational History  .      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 Smoking status: Never Smoker   . Smokeless tobacco: Never Used  . Alcohol Use: No  . Drug  Use: No  . Sexual Activity: None   Other Topics Concern  . 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    Review of Systems - See HPI.  All other ROS are negative.  BP 118/78  Pulse 66  Temp(Src) 98.3 F (36.8 C) (Oral)  Wt 190 lb (86.183 kg)  Physical Exam  Vitals reviewed. Constitutional: He is oriented to person, place, and time and well-developed, well-nourished, and in no distress.  HENT:  Head: Normocephalic and atraumatic.  Eyes: Conjunctivae are normal.  Cardiovascular: Normal rate, regular rhythm, normal heart sounds and intact distal pulses.   Pulmonary/Chest: Effort normal and breath sounds normal.  Neurological: He is alert and oriented to person, place, and time.  Skin: Skin is warm and dry. No rash noted.  Psychiatric: Mood, memory, affect and judgment normal.    Recent  Results (from the past 2160 hour(s))  URINALYSIS, ROUTINE W REFLEX MICROSCOPIC     Status: Abnormal   Collection Time    05/20/13  9:10 PM      Result Value Ref Range   Color, Urine YELLOW  YELLOW   APPearance CLEAR  CLEAR   Specific Gravity, Urine 1.017  1.005 - 1.030   pH 6.0  5.0 - 8.0   Glucose, UA >1000 (*) NEGATIVE mg/dL   Hgb urine dipstick NEGATIVE  NEGATIVE   Bilirubin Urine NEGATIVE  NEGATIVE   Ketones, ur 15 (*) NEGATIVE mg/dL   Protein, ur NEGATIVE  NEGATIVE mg/dL   Urobilinogen, UA 0.2  0.0 - 1.0 mg/dL   Nitrite NEGATIVE  NEGATIVE   Leukocytes, UA NEGATIVE  NEGATIVE  URINE MICROSCOPIC-ADD ON     Status: None   Collection Time    05/20/13  9:10 PM      Result Value Ref Range   Squamous Epithelial / LPF RARE  RARE   Bacteria, UA RARE  RARE  CBG MONITORING, ED     Status: Abnormal   Collection Time    05/20/13  9:14 PM      Result Value Ref Range   Glucose-Capillary 224 (*) 70 - 99 mg/dL  CBC WITH DIFFERENTIAL     Status: None   Collection Time    05/20/13 10:34 PM      Result Value Ref Range   WBC 7.0  4.0 - 10.5 K/uL   RBC 4.85  4.22 - 5.81 MIL/uL   Hemoglobin 13.8  13.0 - 17.0 g/dL   HCT 42.4  39.0 - 52.0 %   MCV 87.4  78.0 - 100.0 fL   MCH 28.5  26.0 - 34.0 pg   MCHC 32.5  30.0 - 36.0 g/dL   RDW 12.4  11.5 - 15.5 %   Platelets 219  150 - 400 K/uL   Neutrophils Relative % 69  43 - 77 %   Neutro Abs 4.8  1.7 - 7.7 K/uL   Lymphocytes Relative 22  12 - 46 %   Lymphs Abs 1.5  0.7 - 4.0 K/uL   Monocytes Relative 9  3 - 12 %   Monocytes Absolute 0.6  0.1 - 1.0 K/uL   Eosinophils Relative 0  0 - 5 %   Eosinophils Absolute 0.0  0.0 - 0.7 K/uL   Basophils Relative 0  0 - 1 %   Basophils Absolute 0.0  0.0 - 0.1 K/uL  COMPREHENSIVE METABOLIC PANEL     Status: Abnormal   Collection  Time    05/20/13 10:34 PM      Result Value Ref Range   Sodium 137  137 - 147 mEq/L   Potassium 4.2  3.7 - 5.3 mEq/L   Chloride 96  96 - 112 mEq/L   CO2 26  19 - 32 mEq/L    Glucose, Bld 237 (*) 70 - 99 mg/dL   BUN 12  6 - 23 mg/dL   Creatinine, Ser 0.90  0.50 - 1.35 mg/dL   Calcium 9.8  8.4 - 10.5 mg/dL   Total Protein 7.6  6.0 - 8.3 g/dL   Albumin 4.3  3.5 - 5.2 g/dL   AST 40 (*) 0 - 37 U/L   ALT 30  0 - 53 U/L   Alkaline Phosphatase 61  39 - 117 U/L   Total Bilirubin 1.2  0.3 - 1.2 mg/dL   GFR calc non Af Amer >90  >90 mL/min   GFR calc Af Amer >90  >90 mL/min   Comment: (NOTE)     The eGFR has been calculated using the CKD EPI equation.     This calculation has not been validated in all clinical situations.     eGFR's persistently <90 mL/min signify possible Chronic Kidney     Disease.    Assessment/Plan: DIABETES MELLITUS, TYPE II, UNCONTROLLED Doing well.  Continue follow-up with Endocrinology.  HYPERLIPIDEMIA Will obtain fasting lipid panel.  DEPRESSION Doing very well off medication.  Will continue to monitor symptoms.  Follow-up in 3 months.

## 2013-06-12 NOTE — Patient Instructions (Signed)
Please continue current medication regimen.  Please obtain labs.  I will call you with your results.  Follow-up in 6 months.  Continue following with Endocrinology.

## 2013-06-12 NOTE — Assessment & Plan Note (Signed)
Doing well.  Continue follow-up with Endocrinology.

## 2013-06-12 NOTE — Assessment & Plan Note (Signed)
Will obtain fasting lipid panel 

## 2013-06-12 NOTE — Telephone Encounter (Signed)
Patient reports he had labs drawn about 30 min ago/SLS

## 2013-06-12 NOTE — Progress Notes (Signed)
Pre-visit discussion using our clinic review tool. No additional management support is needed unless otherwise documented below in the visit note.  

## 2013-06-13 ENCOUNTER — Telehealth: Payer: Self-pay | Admitting: Physician Assistant

## 2013-06-13 DIAGNOSIS — M5412 Radiculopathy, cervical region: Secondary | ICD-10-CM

## 2013-06-13 NOTE — Telephone Encounter (Signed)
Harriet ButteJeff Mroczkowski needs a referral to a neurologist since he still has numbness.  [facility]

## 2013-06-14 NOTE — Telephone Encounter (Signed)
Will place a referral to Neurosurgery.  Neurology will likely not take patient because his symptoms seem consistent with nerve compression due to disc disease.  They would likely want him to see Neurosurgery for evaluation.

## 2013-06-14 NOTE — Telephone Encounter (Signed)
Patient informed, understood & agreed/SLS  

## 2013-06-16 ENCOUNTER — Encounter: Payer: Self-pay | Admitting: Physician Assistant

## 2013-06-20 ENCOUNTER — Encounter: Payer: Self-pay | Admitting: Physician Assistant

## 2013-06-20 ENCOUNTER — Telehealth: Payer: Self-pay

## 2013-06-20 DIAGNOSIS — F32A Depression, unspecified: Secondary | ICD-10-CM

## 2013-06-20 DIAGNOSIS — F419 Anxiety disorder, unspecified: Principal | ICD-10-CM

## 2013-06-20 DIAGNOSIS — F329 Major depressive disorder, single episode, unspecified: Secondary | ICD-10-CM

## 2013-06-20 NOTE — Telephone Encounter (Signed)
Relevant patient education assigned to patient using Emmi. ° °

## 2013-06-24 MED ORDER — ESCITALOPRAM OXALATE 20 MG PO TABS: ORAL_TABLET | ORAL | Status: DC

## 2013-06-27 ENCOUNTER — Other Ambulatory Visit: Payer: Self-pay

## 2013-07-02 ENCOUNTER — Other Ambulatory Visit: Payer: Self-pay | Admitting: Physician Assistant

## 2013-07-03 NOTE — Telephone Encounter (Signed)
Lab order

## 2013-07-09 ENCOUNTER — Ambulatory Visit: Payer: 59 | Admitting: *Deleted

## 2013-07-10 ENCOUNTER — Ambulatory Visit: Payer: 59 | Admitting: *Deleted

## 2013-07-24 ENCOUNTER — Ambulatory Visit (INDEPENDENT_AMBULATORY_CARE_PROVIDER_SITE_OTHER): Payer: 59 | Admitting: Neurology

## 2013-07-24 ENCOUNTER — Encounter: Payer: Self-pay | Admitting: Neurology

## 2013-07-24 VITALS — BP 110/78 | HR 67 | Wt 190.2 lb

## 2013-07-24 DIAGNOSIS — M5412 Radiculopathy, cervical region: Secondary | ICD-10-CM

## 2013-07-24 NOTE — Progress Notes (Signed)
[facility] Clinic Note - Initial Visit   Date: 07/24/2013    Nathaniel [name] MRN: [id number] DOB: Apr 27, 1965   Dear Dr Hassell Done:  Thank you for your kind referral of Nathaniel [name] for consultation of cervical radiculopathy. Although his history is well known to you, please allow [place] to reiterate it for the purpose of our medical record. The patient was accompanied to the clinic by self.    History of Present Illness: Nathaniel [name] is a 48 y.o. left-handed Caucasian male with history of depression, diabetes mellitus, hyperlipidemia, GERD,  presenting for evaluation of numbness of his right arm.    In November 2014, he developed shooting neck pain radiating into the right hand. It was worse when laying flat.  was associated numbness/tingling.  He was found to have herniated disc at C5-6 with mild flattening of the cord and he underwent epidural injection with complete relief of pain, but continues to have mild numbness over the right upper arm.  He started exercising to try to build his arm strength, but developed worsening numbness involving more of his arm.  He has tried heat which improves.  He takes ibuprofen and Aleve which provides mild relief.   He did a few session of out-patient therapy but due to him starting a new job, he stopped.  He presents today because of worsening numbness after starting exercise program.  He denies any neck pain or discomfort of the left upper extremity.   Out-side paper records, electronic medical record, and images have been reviewed where available and summarized as:  MRI cervical spine 03/09/2013: 1. The primary abnormality is at C5-6 where there is a broad-based disc protrusion with apparent right foraminal and left paracentral components. Detail is limited by motion on the axial images, although there is evidence of mild cord flattening and right greater than left foraminal stenosis.  2. Uncinate spurring and facet  hypertrophy contribute to mild foraminal narrowing at C3-4 and C4-5. No other focal disc protrusion or cord deformity identified.  Lab Results  Component Value Date   HGBA1C 9.5* 12/19/2012     Past Medical History  Diagnosis Date  . Depression   . Hyperlipidemia   . Hypogonadism male   . Headache(784.0)   . Diabetes mellitus     type II  . GERD (gastroesophageal reflux disease)     Past Surgical History  Procedure Laterality Date  . Tonsillectomy  1972  . Nasal sinus surgery  04/1991     Medications:  Current Outpatient Prescriptions on File Prior to Visit  Medication Sig Dispense Refill  . Blood Glucose Monitoring Suppl (ONE TOUCH ULTRA 2) W/DEVICE KIT Use as instructed to check blood sugar three times daily  Dx 250.02  1 each  0  . escitalopram (LEXAPRO) 20 MG tablet Take 1/2 tablet daily for 1 week.  Then increase to 1 tablet daily.  30 tablet  1  . glucagon (GLUCAGON EMERGENCY) 1 MG injection Inject 1 mg into the muscle once as needed.  1 each  3  . glucose blood (ONE TOUCH ULTRA TEST) test strip Use as instructed to check blood sugar three times daily  Dx 250.02  300 each  6  . HUMALOG KWIKPEN 100 UNIT/ML injection inject 5 to 10 units with meals  15 mL  3  . Insulin Glargine 100 UNIT/ML SOPN Inject 13 Units into the skin 2 (two) times daily. Morning & Night.      . Lancets (ONETOUCH ULTRASOFT)  lancets Use as instructed to check blood sugar three times daily  Dx 250.02  300 each  6  . lisinopril (PRINIVIL,ZESTRIL) 2.5 MG tablet TAKE 1 TABLET BY MOUTH ONCE DAILY  30 tablet  5  . metFORMIN (GLUCOPHAGE) 1000 MG tablet TAKE 1 TABLET BY MOUTH TWICE DAILY WITH FOOD  60 tablet  2  . omeprazole (PRILOSEC) 40 MG capsule Take 1 capsule (40 mg total) by mouth daily.  30 capsule  5   No current facility-administered medications on file prior to visit.    Allergies:  Allergies  Allergen Reactions  . Eggs Or Egg-Derived Products     childhood  . Reglan [Metoclopramide] Other  (See Comments)    Jittery, increased anxiety  . Pioglitazone Other (See Comments)      headache  . Promethazine Hcl Nausea And Vomiting         Family History: Family History  Problem Relation Age of Onset  . Adopted: Yes  . Cancer Father 65    Prostate cancer  . Healthy Mother   . Diabetes Mellitus I Son     Social History: History   Social History  . Marital Status: Married    Spouse Name: [name]    Number of Children: 2  . Years of Education: N/A   Occupational History  .      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 Smoking status: Never Smoker   . Smokeless tobacco: Never Used  . Alcohol Use: No  . Drug Use: No  . Sexual Activity: Not on file   Other Topics Concern  . Not on file   Social History Narrative   He lives with wife and two children.   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   Highest level of education: master degree    Review of Systems:  CONSTITUTIONAL: No fevers, chills, night sweats, or weight loss.   EYES: No visual changes or eye pain ENT: No hearing changes.  No history of nose bleeds.   RESPIRATORY: No cough, wheezing and shortness of breath.   CARDIOVASCULAR: Negative for chest pain, and palpitations.   GI: Negative for abdominal discomfort, blood in stools or black stools.  No recent change in bowel habits.   GU:  No history of incontinence.   MUSCLOSKELETAL: No history of joint pain or swelling.  No myalgias.   SKIN: Negative for lesions, rash, and itching.   HEMATOLOGY/ONCOLOGY: Negative for prolonged bleeding, bruising easily, and swollen nodes.  No history of cancer.   ENDOCRINE: Negative for cold or heat intolerance, polydipsia or goiter.   PSYCH:  +depression or anxiety symptoms.   NEURO: As Above.   Vital Signs:  BP 110/78  Pulse 67  Wt 190 lb 4 oz (86.297 kg)  SpO2 96% Pain Scale: 0 on a scale of 0-10   General Medical Exam:   General:  Well appearing,  comfortable.   Eyes/ENT: see cranial nerve examination.   Neck: No masses appreciated.  Full range of motion without tenderness.  No carotid bruits. Respiratory:  Clear to auscultation, good air entry bilaterally.   Cardiac:  Regular rate and rhythm, no murmur.   Back:  No pain to palpation of spinous processes.   Extremities:  No deformities, edema, or skin discoloration. Good capillary refill.   Skin:  Skin color, texture, turgor normal. No rashes or lesions.  Neurological Exam: MENTAL STATUS including orientation to time, place, person, recent and remote memory,  attention span and concentration, language, and fund of knowledge is normal.  Speech is not dysarthric.  CRANIAL NERVES: II:  No visual field defects.  Unremarkable fundi.   III-IV-VI: Pupils equal round and reactive to light.  Normal conjugate, extra-ocular eye movements in all directions of gaze.  No nystagmus.  No ptosis.   V:  Normal facial sensation.   VII:  Normal facial symmetry and movements.   VIII:  Normal hearing and vestibular function.   IX-X:  Normal palatal movement.   XI:  Normal shoulder shrug and head rotation.   XII:  Normal tongue strength and range of motion, no deviation or fasciculation.  MOTOR:  No atrophy, fasciculations or abnormal movements.  No pronator drift.  Tone is normal.    Right Upper Extremity:    Left Upper Extremity:    Deltoid  5/5   Deltoid  5/5   Biceps  5/5   Biceps  5/5   Triceps  5/5   Triceps  5/5   Wrist extensors  5/5   Wrist extensors  5/5   Wrist flexors  5/5   Wrist flexors  5/5   Finger extensors  5/5   Finger extensors  5/5   Finger flexors  5/5   Finger flexors  5/5   Dorsal interossei  5/5   Dorsal interossei  5/5   Abductor pollicis  5/5   Abductor pollicis  5/5   Tone (Ashworth scale)  0  Tone (Ashworth scale)  0   Right Lower Extremity:    Left Lower Extremity:    Hip flexors  5/5   Hip flexors  5/5   Hip extensors  5/5   Hip extensors  5/5   Knee flexors  5/5    Knee flexors  5/5   Knee extensors  5/5   Knee extensors  5/5   Dorsiflexors  5/5   Dorsiflexors  5/5   Plantarflexors  5/5   Plantarflexors  5/5   Toe extensors  5/5   Toe extensors  5/5   Toe flexors  5/5   Toe flexors  5/5   Tone (Ashworth scale)  0  Tone (Ashworth scale)  0   MSRs:  Right                                                                 Left brachioradialis 2+  brachioradialis 2+  biceps trace  biceps 2+  triceps 2+  triceps 2+  patellar 2+  patellar 2+  ankle jerk 2+  ankle jerk 2+  Hoffman no  Hoffman no  plantar response down  plantar response down   SENSORY:  Temperature and pin prick is reduced over the right C5 dermatome.  Otherwise, normal and symmetric perception of light touch, pinprick and vibration in the left arm and bilateral lower extremities.   COORDINATION/GAIT: Normal finger-to- nose-finger and heel-to-shin.  Intact rapid alternating movements bilaterally.  Able to rise from a chair without using arms.  Gait narrow based and stable.    IMPRESSION: 1.  Cervical radiculopathy with disc herniation at C5-6 and bilateral foraminal stenosis  - Clinically with worsening numbness over the C5 dermatome  - I personally reviewed his MRI images with patient and discussed that due to structural lesion, he  will always be at risk for worsening of symptoms especially if he over exerts himself  - Since he has no neck pain or painful paresthesias, there is no role for neuralgesic medication  - No need for EMG or repeat imaging as history and exam is consistent with diagnosis  - I would like for him to restart therapy so he knows his functional limitation of weight bearing and means to safely participate in exercise programs 2.  Return to clinic as needed    The duration of this appointment visit was 40 minutes of face-to-face time with the patient.  Greater than 50% of this time was spent in counseling, explanation of diagnosis, planning of further management, and  coordination of care.   Thank you for allowing me to participate in patient's care.  If I can answer any additional questions, I would be pleased to do so.    Sincerely,    Areal Cochrane K. Posey Pronto, DO

## 2013-07-24 NOTE — Patient Instructions (Addendum)
1.  Start out-patient occupational therapy 2.  Encouraged to maintain tight glycemic control 3.  Limit weight bearing to what you can tolerate 4.  Return to clinic as needed

## 2013-07-30 ENCOUNTER — Encounter: Payer: Self-pay | Admitting: Internal Medicine

## 2013-07-30 ENCOUNTER — Ambulatory Visit (INDEPENDENT_AMBULATORY_CARE_PROVIDER_SITE_OTHER): Payer: 59 | Admitting: Internal Medicine

## 2013-07-30 VITALS — BP 108/68 | HR 73 | Temp 97.8°F | Resp 12 | Wt 189.0 lb

## 2013-07-30 DIAGNOSIS — E1165 Type 2 diabetes mellitus with hyperglycemia: Principal | ICD-10-CM

## 2013-07-30 DIAGNOSIS — IMO0001 Reserved for inherently not codable concepts without codable children: Secondary | ICD-10-CM

## 2013-07-30 LAB — HEMOGLOBIN A1C: Hgb A1c MFr Bld: 10.1 % — ABNORMAL HIGH (ref 4.6–6.5)

## 2013-07-30 MED ORDER — INSULIN LISPRO 100 UNIT/ML (KWIKPEN): 5.0000 [IU] | PEN_INJECTOR | Freq: Three times a day (TID) | SUBCUTANEOUS | Status: DC

## 2013-07-30 MED ORDER — GLUCOSE BLOOD VI STRP: ORAL_STRIP | Status: DC

## 2013-07-30 NOTE — Progress Notes (Signed)
Patient ID: Nathaniel [name], male   DOB: 03-04-1966, 48 y.o.   MRN: [id number]  HPI: Nathaniel [name] is a 48 y.o.-year-old male, returning for f/u for DM2, dx 2001, insulin-dependent since 2010, uncontrolled, without complications. Last visit 3 mo ago.  Last hemoglobin A1c was: 04/11/2013: 9.8% Lab Results  Component Value Date   HGBA1C 9.5* 12/19/2012   HGBA1C 9.7* 02/17/2012   HGBA1C 7.6* 08/29/2011  He had steroid injections for neck pain 02/2013-03/2013.  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  Pt checks his sugars 3-4 a day and they are (no log, but has one at home): - am: 220s when on steroids, 80-150 >> 78-241 - 2h after b'fast: n/c >> 191-222 - before lunch: 80-240 (misjudging the Humalog) >> 130-317 - 2h after lunch: n/c >> 86-302 - before dinner: 250s (100s-300s) >> 129-292 - 2h after dinner: n/c >> 101-337 - bedtime: 160-180 >> n/c - nighttime: checks if feels low: 50-60s once every 2 weeks >> n/c but one 48 Few lows. Lowest sugar was 48; he has hypoglycemia awareness at 70.  Highest sugar was 300s.  Pt's meals are: - Breakfast: cereal + 1% milk, with protein bar - Lunch: sandwich - Dinner: eating out or eating in - Snacks: 1-2 a day He has a low appetite. Walks 3-4x a week. [employer] [place] a lot for his job.  - no CKD, last BUN/creatinine:  Lab Results  Component Value Date   BUN 12 05/20/2013   CREATININE 0.90 05/20/2013  He is on Lisinopril. - last set of lipids: Lab Results  Component Value Date   CHOL 181 06/12/2013   HDL 65 06/12/2013   LDLCALC 103* 06/12/2013   TRIG 66 06/12/2013   CHOLHDL 2.8  06/12/2013  - last eye exam was in 06/2012. No DR.  - no numbness and tingling in his feet.  I reviewed pt's medications, allergies, PMH, social hx, family hx and no changes required, except as mentioned above.  ROS: Constitutional: no weight gain/loss, + fatigue, no subjective hyperthermia/hypothermia, + poor sleep Eyes: no blurry vision, no xerophthalmia ENT: no sore throat, no nodules palpated in throat, no dysphagia/odynophagia, no hoarseness Cardiovascular: no CP/SOB/palpitations/leg swelling Respiratory: no cough/SOB Gastrointestinal: no N/V/D/C Musculoskeletal: no muscle/joint aches Skin: no rashes Neurological: no tremors/numbness/tingling/dizziness  PE: BP 108/68  Pulse 73  Temp(Src) 97.8 F (36.6 C) (Oral)  Resp 12  Wt 189 lb (85.73 kg)  SpO2 99% Wt Readings from Last 3 Encounters:  07/30/13 189 lb (85.73 kg)  07/24/13 190 lb 4 oz (86.297 kg)  06/12/13 190 lb (86.183 kg)   Constitutional: normal weight, in NAD, appears depressed Eyes: PERRLA, EOMI, no exophthalmos ENT: moist mucous membranes, no thyromegaly, no cervical lymphadenopathy Cardiovascular: RRR, No MRG Respiratory: CTA B Gastrointestinal: abdomen soft, NT, ND, BS+ Musculoskeletal: no deformities, strength intact in all 4 Skin: moist, warm, no rashes Neurological: no tremor with outstretched hands, DTR normal in all 4  ASSESSMENT: 1. DM2, insulin-dependent, uncontrolled,  without complications  PLAN:  1. Patient with long-standing, uncontrolled diabetes, on basal-bolus insulin, plus Metformin. His sugars are variable, increasing throughout the day, a sign of not enough insulin with meals, but he admits of skipping the lunchtime insulin at times to avid dropping sugars later in the day when he drives most. Indeed, when he takes it, he can drop sugars >30 points, many times. - We discussed about options for treatment, and I suggested to:    Patient Instructions  Continue Metformin 1000 mg 2x a  day. Please decrease the Lantus to 13 units 2x a day. Use Humalog as follows: - insulin to carb ratio 15 - target sugar 130 - insulin sensitivity factor 25: - 130-155: + 1 unit  - 156-180: + 2 units  - 181-205: + 3 units  - 206-230: + 4 units  - >231: + 5 units  Please try not to miss insulin doses. Use no more than 3 units of Humalog at bedtime for correction, and only if sugars >250.  Please return in 1 month with your sugar log.   Please stop at the lab.  - continue checking sugars at different times of the day - check 3-4 times a day, rotating checks - advised for yearly eye exams - he is up to date - check A1c today - Return to clinic in 1 mo with sugar log   Office Visit on 07/30/2013  Component Date Value Ref Range Status  . Hemoglobin A1C 07/30/2013 10.1* 4.6 - 6.5 % Final   Glycemic Control Guidelines for People with Diabetes:Non Diabetic:  <6%Goal of Therapy: <7%Additional Action Suggested:  >8%    HbA1c higher. He is omitting insulin doses entirely to avoid lows later on. I will decrease his bolusing and encouraged him not to miss doses.

## 2013-07-30 NOTE — Patient Instructions (Addendum)
Continue Metformin 1000 mg 2x a day. Please decrease the Lantus to 13 units 2x a day. Use Humalog as follows: - insulin to carb ratio 15 - target sugar 130 - insulin sensitivity factor 25: - 130-155: + 1 unit  - 156-180: + 2 units  - 181-205: + 3 units  - 206-230: + 4 units  - >231: + 5 units  Please try not to miss insulin doses. Use no more than 3 units of Humalog at bedtime for correction, and only if sugars >250.  Please return in 1 month with your sugar log.   Please stop at the lab.

## 2013-08-01 ENCOUNTER — Ambulatory Visit: Payer: 59 | Attending: Neurology | Admitting: Rehabilitation

## 2013-08-01 DIAGNOSIS — B029 Zoster without complications: Secondary | ICD-10-CM | POA: Insufficient documentation

## 2013-08-01 DIAGNOSIS — IMO0001 Reserved for inherently not codable concepts without codable children: Secondary | ICD-10-CM | POA: Insufficient documentation

## 2013-08-01 DIAGNOSIS — M6281 Muscle weakness (generalized): Secondary | ICD-10-CM | POA: Diagnosis not present

## 2013-08-01 DIAGNOSIS — R5383 Other fatigue: Secondary | ICD-10-CM | POA: Diagnosis not present

## 2013-08-01 DIAGNOSIS — R5381 Other malaise: Secondary | ICD-10-CM | POA: Diagnosis not present

## 2013-08-01 DIAGNOSIS — E7800 Pure hypercholesterolemia, unspecified: Secondary | ICD-10-CM | POA: Insufficient documentation

## 2013-08-01 DIAGNOSIS — E119 Type 2 diabetes mellitus without complications: Secondary | ICD-10-CM | POA: Diagnosis not present

## 2013-08-05 ENCOUNTER — Ambulatory Visit: Payer: 59 | Admitting: Rehabilitation

## 2013-08-05 DIAGNOSIS — IMO0001 Reserved for inherently not codable concepts without codable children: Secondary | ICD-10-CM | POA: Diagnosis not present

## 2013-08-08 ENCOUNTER — Ambulatory Visit: Payer: 59 | Admitting: Rehabilitation

## 2013-08-08 DIAGNOSIS — IMO0001 Reserved for inherently not codable concepts without codable children: Secondary | ICD-10-CM | POA: Diagnosis not present

## 2013-08-14 ENCOUNTER — Ambulatory Visit: Payer: 59 | Admitting: Rehabilitation

## 2013-08-14 DIAGNOSIS — IMO0001 Reserved for inherently not codable concepts without codable children: Secondary | ICD-10-CM | POA: Diagnosis not present

## 2013-08-16 ENCOUNTER — Ambulatory Visit: Payer: 59 | Admitting: Rehabilitation

## 2013-08-17 ENCOUNTER — Emergency Department
Admission: EM | Admit: 2013-08-17 | Discharge: 2013-08-17 | Disposition: A | Discharge status: Home / Self Care | Payer: 59 | Source: Home / Self Care

## 2013-08-17 ENCOUNTER — Encounter: Payer: Self-pay | Admitting: Emergency Medicine

## 2013-08-17 DIAGNOSIS — J209 Acute bronchitis, unspecified: Secondary | ICD-10-CM

## 2013-08-17 MED ORDER — CEFTRIAXONE SODIUM 1 G IJ SOLR
1.0000 g | INTRAMUSCULAR | Status: AC
  Administered 2013-08-17: 1 g via INTRAMUSCULAR

## 2013-08-17 MED ORDER — AZITHROMYCIN 250 MG PO TABS: ORAL_TABLET | ORAL | Status: DC

## 2013-08-17 MED ORDER — HYDROCODONE-HOMATROPINE 5-1.5 MG/5ML PO SYRP: 5.0000 mL | ORAL_SOLUTION | Freq: Four times a day (QID) | ORAL | Status: DC | PRN

## 2013-08-17 NOTE — ED Notes (Signed)
Pt complains of chest and nasal congestion, cough with yellow mucous, reflux, sinus pressure for 4 days. Pt tried left over tessalon pearls he had left over that seemed to help.

## 2013-08-17 NOTE — ED Provider Notes (Signed)
CSN: [id number]     Arrival date & time 08/17/13  1304 History   None    Chief Complaint  Patient presents with  . Cough  . Nasal Congestion   (Consider location/radiation/quality/duration/timing/severity/associated sxs/prior Treatment) HPI URI HISTORY  Nathaniel [name] is a 48 y.o. male who complains of  worsening cough/cold symptoms for 4 days.  Have been using Tessalon Perles for cough, but he feels it's not helping significantly. No chills/sweats +  Low-grade Fever  +  Nasal congestion +  Discolored Post-nasal drainage Positive sinus pain/pressure + minimal sore throat  +  Cough, productive of yellow mucus No wheezing Mild chest congestion No hemoptysis No shortness of breath No pleuritic pain  No itchy/red eyes No earache  No nausea No vomiting No abdominal pain No diarrhea  No skin rashes +  Fatigue + myalgias No headache   He states his diabetes has been controlled, except blood sugars at home running 180-190 the past day or so. Denies hypoglycemia symptoms.  Past Medical History  Diagnosis Date  . Depression   . Hyperlipidemia   . Hypogonadism male   . Headache(784.0)   . Diabetes mellitus     type II  . GERD (gastroesophageal reflux disease)    Past Surgical History  Procedure Laterality Date  . Tonsillectomy  1972  . Nasal sinus surgery  04/1991   Family History  Problem Relation Age of Onset  . Adopted: Yes  . Cancer Father 55    Prostate cancer  . Healthy Mother   . Diabetes Mellitus I Son    History  Substance Use Topics  . Smoking status: Never Smoker   . Smokeless tobacco: Never Used  . Alcohol Use: No    Review of Systems  All other systems reviewed and are negative.   Allergies  Eggs or egg-derived products; Reglan; Pioglitazone; and Promethazine hcl  Home Medications   Prior to Admission medications   Medication Sig Start Date End Date Taking? Authorizing Provider  azithromycin (ZITHROMAX Z-PAK) 250 MG tablet Take 2 tablets  on day one, then 1 tablet daily on days 2 through 5 08/17/13   Jacqulyn Cane, MD  Blood Glucose Monitoring Suppl (ONE TOUCH ULTRA 2) W/DEVICE KIT Use as instructed to check blood sugar three times daily  Dx 250.02 07/19/11   Burnice Logan, MD  escitalopram (LEXAPRO) 20 MG tablet Take 1/2 tablet daily for 1 week.  Then increase to 1 tablet daily. 06/24/13   Leeanne Rio, PA-C  glucagon (GLUCAGON EMERGENCY) 1 MG injection Inject 1 mg into the muscle once as needed. 02/17/12   Burnice Logan, MD  glucose blood (ONE TOUCH ULTRA TEST) test strip Use as instructed to check blood sugar three times daily  Dx 250.02 07/30/13   Philemon Kingdom, MD  HYDROcodone-homatropine [facility]) 5-1.5 MG/5ML syrup Take 5 mLs by mouth every 6 (six) hours as needed for cough. May cause drowsiness. 08/17/13   Jacqulyn Cane, MD  Insulin Glargine 100 UNIT/ML SOPN Inject 13 Units into the skin 2 (two) times daily. Morning & Night. 09/07/12   Tammi Sou, MD  insulin lispro (HUMALOG KWIKPEN) 100 UNIT/ML KiwkPen Inject 0.05-0.1 mLs (5-10 Units total) into the skin 3 (three) times daily. 07/30/13   Philemon Kingdom, MD  Lancets [facility] ULTRASOFT) lancets Use as instructed to check blood sugar three times daily  Dx 250.02 01/07/13   Leeanne Rio, PA-C  lisinopril (PRINIVIL,ZESTRIL) 2.5 MG tablet TAKE 1 TABLET BY MOUTH ONCE DAILY 07/02/13  Debbrah Alar, NP  metFORMIN (GLUCOPHAGE) 1000 MG tablet TAKE 1 TABLET BY MOUTH TWICE DAILY WITH FOOD 02/22/12   Burnice Logan, MD  omeprazole (PRILOSEC) 40 MG capsule Take 1 capsule (40 mg total) by mouth daily. 08/02/12   Debbrah Alar, NP   BP 105/72  Pulse 91  Temp(Src) 97.7 F (36.5 C) (Oral)  Ht 5' 11.5" (1.816 m)  Wt 186 lb 12 oz (84.709 kg)  BMI 25.69 kg/m2  SpO2 99% Physical Exam  Nursing note and vitals reviewed. Constitutional: He is oriented to person, place, and time. He appears well-developed and well-nourished. No distress.  HENT:  Head: Normocephalic  and atraumatic.  Right Ear: Tympanic membrane, external ear and ear canal normal.  Left Ear: Tympanic membrane, external ear and ear canal normal.  Nose: Mucosal edema and rhinorrhea present. Right sinus exhibits maxillary sinus tenderness. Left sinus exhibits maxillary sinus tenderness.  Mouth/Throat: Oropharynx is clear and moist. No oral lesions. No oropharyngeal exudate.  Eyes: Right eye exhibits no discharge. Left eye exhibits no discharge. No scleral icterus.  Neck: Neck supple.  Cardiovascular: Normal rate, regular rhythm and normal heart sounds.   Pulmonary/Chest: Effort normal. No respiratory distress. He has no wheezes. He has rhonchi. He has no rales.  Lymphadenopathy:    He has no cervical adenopathy.  Neurological: He is alert and oriented to person, place, and time.  Skin: Skin is warm and dry.    ED Course  Procedures (including critical care time) Labs Review Labs Reviewed - No data to display  Imaging Review No results found.   MDM   1. Acute bronchitis    with an element of acute maxillary sinusitis. There are no rales and no signs of pneumonia. He declined chest x-ray today.  Treatment options discussed, as well as risks, benefits, alternatives. Patient voiced understanding and agreement with the following plans: Because of his risk factor of diabetes, we both agree with aggressive treatment. Rocephin 1 g IM stat Zithromax Z-PAK Other symptomatic care discussed He requested cough syrup to use only at night, and I prescribed Hycodan for nighttime use, but precautions discussed   Jacqulyn Cane, MD 08/17/13 [phone]

## 2013-08-19 ENCOUNTER — Ambulatory Visit: Payer: 59 | Admitting: Rehabilitation

## 2013-08-22 ENCOUNTER — Ambulatory Visit: Payer: 59 | Admitting: Rehabilitation

## 2013-08-26 ENCOUNTER — Ambulatory Visit: Payer: 59 | Admitting: Rehabilitation

## 2013-08-28 ENCOUNTER — Other Ambulatory Visit: Payer: Self-pay | Admitting: Physician Assistant

## 2013-08-29 ENCOUNTER — Ambulatory Visit: Payer: 59 | Admitting: Rehabilitation

## 2013-08-29 NOTE — Telephone Encounter (Signed)
eScribe request from [facility] for refill on Lexapro 20 mg Last filled - 04.06.15, #30x1 Last AEX - 03.25.15 Next AEX - 3 Mths Please Advise on refills/SLS

## 2013-09-05 ENCOUNTER — Ambulatory Visit (INDEPENDENT_AMBULATORY_CARE_PROVIDER_SITE_OTHER): Payer: 59 | Admitting: Internal Medicine

## 2013-09-05 ENCOUNTER — Encounter: Payer: Self-pay | Admitting: Internal Medicine

## 2013-09-05 VITALS — BP 114/62 | HR 93 | Temp 97.8°F | Resp 12 | Wt 189.0 lb

## 2013-09-05 DIAGNOSIS — M79609 Pain in unspecified limb: Secondary | ICD-10-CM

## 2013-09-05 DIAGNOSIS — IMO0001 Reserved for inherently not codable concepts without codable children: Secondary | ICD-10-CM

## 2013-09-05 DIAGNOSIS — E1165 Type 2 diabetes mellitus with hyperglycemia: Principal | ICD-10-CM

## 2013-09-05 DIAGNOSIS — M79671 Pain in right foot: Secondary | ICD-10-CM | POA: Insufficient documentation

## 2013-09-05 NOTE — Patient Instructions (Signed)
-   Continue Metformin 1000 mg po bid - Continue Lantus 13 units 2x a day - Continue Humalog insulin to carb ratio: 1:15 regularly, but when you have less active days, change to 1:10 - Continue target sugar 130 - Continue insulin sensitivity factor 25: - 130-155: + 1 unit  - 156-180: + 2 units  - 181-205: + 3 units  - 206-230: + 4 units  - >231: + 5 units When you correct your sugars at bedtime, please inject less sliding scale by 2 units.

## 2013-09-05 NOTE — Progress Notes (Signed)
Patient ID: Nathaniel [name], male   DOB: 04-22-1965, 48 y.o.   MRN: [id number]  HPI: Nathaniel [name] is a 48 y.o.-year-old male, returning for f/u for DM2, dx 2001, insulin-dependent since 2010, uncontrolled, without complications. Last visit 1 mo ago.  He had bronchitis 3 weeks ago >> was on a Z-pack.   Last hemoglobin A1c was: Lab Results  Component Value Date   HGBA1C 10.1* 07/30/2013   HGBA1C 9.5* 12/19/2012   HGBA1C 9.7* 02/17/2012  04/11/2013: 9.8% He had steroid injections for neck pain 02/2013-03/2013.  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  Pt checks his sugars 3-4 a day and they are (no log, but has one at home): - am: 220s when on steroids, 80-150 >> 78-241 >> 71-176 (190, 244) - 2h after b'fast: n/c >> 191-222 >> n/c - before lunch: 80-240 (misjudging the Humalog) >> 130-317 >> 97-290, most <200 - 2h after lunch: n/c >> 86-302 >> 122-236 - before dinner: 250s (100s-300s) >> 129-292 >> 67-260 - 2h after dinner: n/c >> 101-337 - bedtime: 160-180 >> n/c - nighttime: checks if feels low: 50-60s once every 2 weeks >> n/c but one 48 Few lows. Lowest sugar was 48; he has hypoglycemia awareness at 70.  Highest sugar was 300s.  Pt's meals are: - Breakfast: cereal + 1% milk, with protein bar - Lunch: sandwich - Dinner: eating out or eating in - Snacks: 1-2 a day He has a low appetite. Walks 3-4x a week. Pleas Koch[employer] a lot for his job.  - no CKD, last BUN/creatinine:  Lab Results  Component Value Date   BUN 12 05/20/2013   CREATININE 0.90 05/20/2013  He is on Lisinopril. - last set of lipids: Lab  Results  Component Value Date   CHOL 181 06/12/2013   HDL 65 06/12/2013   LDLCALC 103* 06/12/2013   TRIG 66 06/12/2013   CHOLHDL 2.8 06/12/2013  - last eye exam was in 06/2012. No DR.  - no numbness and tingling in his feet. He has a place on the lateral R foot >> pain.   I reviewed pt's medications, allergies, PMH, social hx, family hx and no changes required, except as mentioned above.  ROS: Constitutional: no weight gain/loss, + fatigue, no subjective hyperthermia/hypothermia, + poor sleep Eyes: no blurry vision, no xerophthalmia ENT: no sore throat, no nodules palpated in throat, no dysphagia/odynophagia, no hoarseness Cardiovascular: no CP/SOB/palpitations/leg swelling Respiratory: + cough (bronchitis for last 3 weeks)/no SOB Gastrointestinal: no N/V/D/C Musculoskeletal: no muscle/joint aches Skin: no rashes Neurological: no tremors/numbness/tingling/dizziness  PE: BP 114/62  Pulse 93  Temp(Src) 97.8 F (36.6 C) (Oral)  Resp 12  Wt 189 lb (85.73 kg)  SpO2 97% Wt Readings from Last 3 Encounters:  09/05/13 189 lb (85.73 kg)  08/17/13 186 lb 12 oz (84.709 kg)  07/30/13 189 lb (85.73 kg)   Constitutional: normal weight, in NAD, appears depressed Eyes: PERRLA, EOMI, no exophthalmos ENT: moist mucous membranes, no thyromegaly, no cervical lymphadenopathy Cardiovascular:  RRR, No MRG Respiratory: CTA B Gastrointestinal: abdomen soft, NT, ND, BS+ Musculoskeletal: no deformities, strength intact in all 4 Skin: moist, warm, no rashes, no pain on palpation of lateral side of R foot Neurological: no tremor with outstretched hands, DTR normal in all 4  ASSESSMENT: 1. DM2, insulin-dependent, uncontrolled, without complications  2. Foot pain  PLAN:  1. Patient with long-standing, uncontrolled diabetes, on basal-bolus insulin, plus Metformin. His sugars are variable, but improved since last visit, except for the last 2 weeks when he had guests at his house, graduation party for his  daughter and also his b'day. He ate more and was less active. - I suggested to change this insulin doses when he is not active:  Patient Instructions  - Continue Metformin 1000 mg po bid - Continue Lantus 13 units 2x a day - Continue Humalog insulin to carb ratio: 1:15 regularly, but when you have less active days, change to 1:10 - Continue target sugar 130 - Continue insulin sensitivity factor 25: - 130-155: + 1 unit  - 156-180: + 2 units  - 181-205: + 3 units  - 206-230: + 4 units  - >231: + 5 units When you correct your sugars at bedtime, please inject less sliding scale by 2 units.  - continue checking sugars at different times of the day - check 3-4 times a day, rotating checks - advised for yearly eye exams >> needs new one - Return to clinic in 1.5 mo with sugar log   2. Foot pain - does not remember hitting the foot, and does not walk barefoot - examined his R foot: skin is a little erythematous on the side, no swelling, no pain on palpation - advised him to use Ibuprofen as needed for pain - offered to refer him to podiatry, but he refuses

## 2013-09-15 ENCOUNTER — Encounter: Payer: Self-pay | Admitting: Neurology

## 2013-09-18 ENCOUNTER — Encounter: Payer: Self-pay | Admitting: Neurology

## 2013-09-23 ENCOUNTER — Encounter: Payer: Self-pay | Admitting: Internal Medicine

## 2013-09-23 NOTE — Telephone Encounter (Signed)
MyChart message

## 2013-10-01 ENCOUNTER — Encounter: Payer: Self-pay | Admitting: Neurology

## 2013-10-01 ENCOUNTER — Other Ambulatory Visit: Payer: Self-pay | Admitting: *Deleted

## 2013-10-01 MED ORDER — INSULIN GLARGINE 100 UNIT/ML SOLOSTAR PEN: 13.0000 [IU] | PEN_INJECTOR | Freq: Two times a day (BID) | SUBCUTANEOUS | Status: DC

## 2013-10-01 MED ORDER — INSULIN LISPRO 100 UNIT/ML (KWIKPEN): 5.0000 [IU] | PEN_INJECTOR | Freq: Three times a day (TID) | SUBCUTANEOUS | Status: DC

## 2013-10-02 ENCOUNTER — Other Ambulatory Visit: Payer: Self-pay | Admitting: *Deleted

## 2013-10-02 NOTE — Telephone Encounter (Signed)
Please place order for epidural nerve injection by IR.  Thanks.  Rigdon Macomber K. Allena KatzPatel, DO

## 2013-10-03 ENCOUNTER — Other Ambulatory Visit: Payer: Self-pay | Admitting: *Deleted

## 2013-10-03 DIAGNOSIS — M542 Cervicalgia: Secondary | ICD-10-CM

## 2013-10-04 ENCOUNTER — Telehealth: Payer: Self-pay | Admitting: *Deleted

## 2013-10-04 NOTE — Telephone Encounter (Signed)
[facility] for July 27 at 10:00.

## 2013-10-10 NOTE — Progress Notes (Signed)
Patient ID: [name], male   DOB: [birth year], 48 y.o.   MRN: [id number] ATTENDING PHYSICIAN NOTE: I have reviewed the chart and agree with the plan as detailed above. Denny LevySara Neal MD Pager [phone]

## 2013-10-14 ENCOUNTER — Inpatient Hospital Stay: Admission: RE | Admit: 2013-10-14 | Payer: 59 | Source: Ambulatory Visit

## 2013-10-17 ENCOUNTER — Ambulatory Visit (INDEPENDENT_AMBULATORY_CARE_PROVIDER_SITE_OTHER): Payer: 59 | Admitting: Internal Medicine

## 2013-10-17 ENCOUNTER — Encounter: Payer: Self-pay | Admitting: Internal Medicine

## 2013-10-17 VITALS — BP 106/68 | HR 76 | Temp 98.5°F | Resp 12 | Wt 188.0 lb

## 2013-10-17 DIAGNOSIS — IMO0001 Reserved for inherently not codable concepts without codable children: Secondary | ICD-10-CM

## 2013-10-17 DIAGNOSIS — E1165 Type 2 diabetes mellitus with hyperglycemia: Principal | ICD-10-CM

## 2013-10-17 NOTE — Progress Notes (Signed)
Patient ID: Nathaniel LukesJeffrey A Husted, male   DOB: 1965-12-12, 48 y.o.   MRN: [id number]  HPI: Nathaniel LukesJeffrey A Kundert is a 48 y.o.-year-old male, returning for f/u for DM2, dx 2001, insulin-dependent since 2010, uncontrolled, without complications. Last visit 1.5 mo ago.  Has a steroid inj coming up next mo.  Last hemoglobin A1c was: Lab Results  Component Value Date   HGBA1C 10.1* 07/30/2013   HGBA1C 9.5* 12/19/2012   HGBA1C 9.7* 02/17/2012  04/11/2013: 9.8%  Pt is on a regimen of: - Metformin 1000 mg po bid - Lantus 13 units bid (<< once a day as he was dropping his sugars at night) - Humalog insulin to carb ratio: 1:15 but when has less active days, change to 1:10 - target sugar 130 - insulin sensitivity factor 25: - 130-155: + 1 unit  - 156-180: + 2 units  - 181-205: + 3 units  - 206-230: + 4 units  - >231: + 5 units When correcting sugars at bedtime, inject less sliding scale by 2 units.  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  Pt tells me: " if I am doing what you told me to do, my sugars are perfect". 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  Pt checks his sugars 3-4 a day and they are (no log, but has one at home): - am: 220s when on steroids, 80-150 >> 78-241 >> 71-176 (190, 244) >> 103-242 - 2h after b'fast: n/c >> 191-222 >> n/c >> 86-289 - before lunch: 80-240 (misjudging the Humalog) >> 130-317 >> 97-290, most <200 >> 197-217 - 2h after lunch: n/c >> 86-302 >> 122-236 >> 106-503 - before dinner: 250s (100s-300s) >> 129-292 >> 67-260 >> 75-303 - 2h after dinner: n/c >> 101-337 - bedtime: 160-180 >> n/c >> 357 - nighttime: checks if feels low: 50-60s once every 2 weeks >> n/c but one 48 >> n/c  Pt's meals are: - Breakfast: cereal + 1% milk, with protein bar - Lunch: sandwich - Dinner: eating out or eating in - Snacks: 1-2 a  day He has a low appetite. Walks 3-4x a week. Pleas Koch[employer] a lot for his job.  - no CKD, last BUN/creatinine:  Lab Results  Component Value Date   BUN 12 05/20/2013   CREATININE 0.90 05/20/2013  He is on Lisinopril. - last set of lipids: Lab Results  Component Value Date   CHOL 181 06/12/2013   HDL 65 06/12/2013   LDLCALC 103* 06/12/2013   TRIG 66 06/12/2013   CHOLHDL 2.8 06/12/2013  - last eye exam was in 06/2012. No DR.  - no numbness and tingling in his feet. He has a place on the lateral R foot >> pain.   I reviewed pt's medications, allergies, PMH, social hx, family hx and no changes required, except as mentioned above.  ROS: Constitutional: no weight gain/loss, + fatigue, no subjective hyperthermia/hypothermia Eyes: no blurry vision, no xerophthalmia ENT: no sore throat, no nodules palpated in throat, no dysphagia/odynophagia, no hoarseness Cardiovascular: no CP/SOB/palpitations/leg swelling Respiratory: no cough/no SOB Gastrointestinal: no N/V/D/C Musculoskeletal: no muscle/joint aches Skin: no rashes Neurological: no tremors/numbness/tingling/dizziness  PE: BP 106/68  Pulse 76  Temp(Src) 98.5 F (36.9 C) (Oral)  Resp 12  Wt 188 lb (85.276 kg)  SpO2 96% Wt Readings from Last  3 Encounters:  10/17/13 188 lb (85.276 kg)  09/05/13 189 lb (85.73 kg)  08/17/13 186 lb 12 oz (84.709 kg)   Constitutional: normal weight, in NAD Eyes: PERRLA, EOMI, no exophthalmos ENT: moist mucous membranes, no thyromegaly, no cervical lymphadenopathy Cardiovascular: RRR, No MRG Respiratory: CTA B Gastrointestinal: abdomen soft, NT, ND, BS+ Musculoskeletal: no deformities, strength intact in all 4 Skin: moist, warm, no rashes Neurological: no tremor with outstretched hands, DTR normal in all 4  ASSESSMENT: 1. DM2, insulin-dependent, uncontrolled, without complications  PLAN:  1. Patient with long-standing, uncontrolled diabetes, on basal-bolus insulin, plus Metformin. 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 He is excited about this. His sugars over the weekend are mostly at goal, but they increase to 200-400s during the day as he forgets to take his insulin or avoids to take it 2/2 being on the road and being afraid of low CBGs. - I suggested to:  Patient Instructions  - Continue Metformin 1000 mg po bid - Continue Lantus 13 units 2x a day - Continue Humalog insulin to carb ratio (ICR): 1:15 regularly, but when you have less active days, change to 1:10. Right after the steroid injection, you can start with 1:10 ICR, but may need to decrease even to 1:8 or lower. - Continue target sugar 130 - Continue insulin sensitivity factor 25: - 130-155: + 1 unit  - 156-180: + 2 units  - 181-205: + 3 units  - 206-230: + 4 units  - >231: + 5 units When you correct your sugars at bedtime, please inject less sliding scale by 2 units.  Please return in 1.5 months with your sugar log. We will check a Hba1c at next visit. - continue checking sugars at different times of the day - check 3-4 times a day, rotating checks - advised for yearly eye exams >> needs new one >> will have it at the and of next week - Return to clinic in 1.5 mo with sugar log - will check HbA1c then

## 2013-10-17 NOTE — Patient Instructions (Signed)
-   Continue Metformin 1000 mg po bid - Continue Lantus 13 units 2x a day - Continue Humalog insulin to carb ratio (ICR): 1:15 regularly, but when you have less active days, change to 1:10. Right after the steroid injection, you can start with 1:10 ICR, but may need to decrease even to 1:8 or lower. - Continue target sugar 130 - Continue insulin sensitivity factor 25: - 130-155: + 1 unit  - 156-180: + 2 units  - 181-205: + 3 units  - 206-230: + 4 units  - >231: + 5 units When you correct your sugars at bedtime, please inject less sliding scale by 2 units.   Please return in 1.6 months with your sugar log. We will check a Hba1c at next visit.

## 2013-10-29 ENCOUNTER — Ambulatory Visit
Admission: RE | Admit: 2013-10-29 | Discharge: 2013-10-29 | Disposition: A | Discharge status: Home / Self Care | Payer: 59 | Source: Ambulatory Visit | Attending: Neurology | Admitting: Neurology

## 2013-10-29 DIAGNOSIS — M542 Cervicalgia: Secondary | ICD-10-CM

## 2013-10-29 MED ORDER — IOHEXOL 300 MG/ML  SOLN
1.0000 mL | Freq: Once | INTRAMUSCULAR | Status: AC | PRN
  Administered 2013-10-29: 1 mL via INTRAVENOUS

## 2013-10-29 MED ORDER — TRIAMCINOLONE ACETONIDE 40 MG/ML IJ SUSP (RADIOLOGY)
60.0000 mg | Freq: Once | INTRAMUSCULAR | Status: AC
  Administered 2013-10-29: 60 mg via EPIDURAL

## 2013-10-29 NOTE — Discharge Instructions (Signed)

## 2013-11-08 ENCOUNTER — Ambulatory Visit (INDEPENDENT_AMBULATORY_CARE_PROVIDER_SITE_OTHER): Payer: Self-pay | Admitting: Family Medicine

## 2013-11-08 VITALS — BP 115/71 | HR 73 | Ht 71.5 in | Wt 186.2 lb

## 2013-11-08 DIAGNOSIS — E1159 Type 2 diabetes mellitus with other circulatory complications: Secondary | ICD-10-CM

## 2013-11-08 NOTE — Progress Notes (Signed)
Patient presents today for DM follow-up as part of employer-sponsored [facility] to [facility]. Medications, glucose readings, a1c and compliance have been reviewed. I have also discussed with patient lifestyle interventions including diet and exercise. Details of the visit can be found in [facility] documenting program through [facility] Patient has set a series of personal goals and will follow-up in no more than 3 months for further review of DM.  Today's a1c: 9.5%. Pain in neck radiating down right arm still persists. Goals: to improve diet, exercise, stress control.

## 2013-11-10 ENCOUNTER — Encounter: Payer: Self-pay | Admitting: Physician Assistant

## 2013-11-11 ENCOUNTER — Encounter: Payer: Self-pay | Admitting: Physician Assistant

## 2013-11-11 NOTE — Telephone Encounter (Signed)
I do not see where patient has ever had this medication, not shown in Historical and/or Present medication list/SLS Please Advise.

## 2013-11-16 ENCOUNTER — Encounter: Payer: Self-pay | Admitting: Neurology

## 2013-11-22 ENCOUNTER — Ambulatory Visit: Payer: 59 | Admitting: Family Medicine

## 2013-11-29 ENCOUNTER — Ambulatory Visit: Payer: 59 | Admitting: Internal Medicine

## 2013-12-05 ENCOUNTER — Other Ambulatory Visit: Payer: Self-pay | Admitting: Physician Assistant

## 2013-12-05 ENCOUNTER — Encounter: Payer: Self-pay | Admitting: Family Medicine

## 2013-12-05 NOTE — Progress Notes (Signed)
Patient ID: Nathaniel [name], male   DOB: [birth year], 48 y.o.   MRN: [id number] Reviewed: Agree with the documentation and management of our [facility] pharmacologist.

## 2013-12-10 ENCOUNTER — Encounter: Payer: Self-pay | Admitting: Family

## 2013-12-10 ENCOUNTER — Ambulatory Visit (INDEPENDENT_AMBULATORY_CARE_PROVIDER_SITE_OTHER): Payer: 59 | Admitting: Family

## 2013-12-10 VITALS — BP 100/74 | HR 79 | Temp 98.3°F | Resp 16 | Ht 71.5 in | Wt 187.1 lb

## 2013-12-10 DIAGNOSIS — F329 Major depressive disorder, single episode, unspecified: Secondary | ICD-10-CM

## 2013-12-10 DIAGNOSIS — F3289 Other specified depressive episodes: Secondary | ICD-10-CM

## 2013-12-10 MED ORDER — ESCITALOPRAM OXALATE 10 MG PO TABS: 10.0000 mg | ORAL_TABLET | Freq: Every day | ORAL | Status: DC

## 2013-12-10 NOTE — Progress Notes (Signed)
Subjective:    Patient ID: Nathaniel [name], male    DOB: [birth year], 48 y.o.   MRN: [id number]  HPI  Nathaniel [name] is a 48 yr old male who presents today to discuss lexapro.  He is currently taking 56m. He starts counseling on Friday.  Last time he tried to wean off and got on "an emotional roller coaster."   Reports long hx of depression.  Started antidepressants 2006. Denies SI/HI.  Last week he dropped lexapro form 223mto 1048m Still battling depressive symptoms.  Has been on zoloft, wellbutrin, celexa, cymbalta (made him suicidal). Reports that he wakes up frequently. Denies anxiety/panic attack since 2003.    Review of Systems    see HPI  Past Medical History  Diagnosis Date  . Depression   . Hyperlipidemia   . Hypogonadism male   . Headache(784.0)   . Diabetes mellitus     type II  . GERD (gastroesophageal reflux disease)     History   Social History  . Marital Status: Married    Spouse Name: MarEzzard Flax Number of Children: 2  . Years of Education: N/A   Occupational History  .      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 Smoking status: Never Smoker   . Smokeless tobacco: Never Used  . Alcohol Use: No  . Drug Use: No  . Sexual Activity: Not on file   Other Topics Concern  . Not on file   Social History Narrative   He lives with wife and two children.   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 Tonsillectomy  1972  . Nasal sinus surgery  04/1991    Family History  Problem Relation Age of Onset  . Adopted: Yes  . Cancer Father 65 53 Prostate cancer  . Healthy Mother   . Diabetes Mellitus I Son     Allergies  Allergen Reactions  . Eggs Or Egg-Derived Products     childhood  . Pioglitazone Other (See Comments)      headache  . Promethazine Hcl Nausea And Vomiting       . Reglan  [Metoclopramide] Other (See Comments)    Jittery, increased anxiety    Current Outpatient Prescriptions on File Prior to Visit  Medication Sig Dispense Refill  . Blood Glucose Monitoring Suppl (ONE TOUCH ULTRA 2) W/DEVICE KIT Use as instructed to check blood sugar three times daily  Dx 250.02  1 each  0  . glucagon (GLUCAGON EMERGENCY) 1 MG injection Inject 1 mg into the muscle once as needed.  1 each  3  . glucose blood (ONE TOUCH ULTRA TEST) test strip Use as instructed to check blood sugar three times daily  Dx 250.02  300 each  6  . Insulin Glargine (LANTUS) 100 UNIT/ML Solostar Pen Inject 13 Units into the skin 2 (two) times daily. Morning & Night.  15 mL  3  . insulin lispro (HUMALOG KWIKPEN) 100 UNIT/ML KiwkPen Inject 0.05-0.1 mLs (5-10 Units total) into the skin 3 (three) times daily.  15 mL  3  . Lancets (ONETOUCH ULTRASOFT) lancets Use as instructed to check blood sugar three times daily  Dx 250.02  300 each  6  . lisinopril (PRINIVIL,ZESTRIL) 2.5 MG tablet TAKE 1 TABLET BY MOUTH ONCE DAILY  30 tablet  5  . metFORMIN (GLUCOPHAGE) 1000 MG tablet TAKE 1 TABLET BY MOUTH TWICE DAILY WITH FOOD  60 tablet  2   No current facility-administered medications on file prior to visit.    BP 100/74  Pulse 79  Temp(Src) 98.3 F (36.8 C) (Oral)  Resp 16  Ht 5' 11.5" (1.816 m)  Wt 187 lb 2 oz (84.879 kg)  BMI 25.74 kg/m2  SpO2 100%    Objective:   Physical Exam  Constitutional: He is oriented to person, place, and time. He appears well-developed and well-nourished. No distress.  Neurological: He is alert and oriented to person, place, and time.  Psychiatric:  Flat affect          Assessment & Plan:

## 2013-12-10 NOTE — Patient Instructions (Signed)
Continue lexapro  once daily. Keep upcoming appointment with your therapist. Call if you develop worsening depression symptoms. Follow up in 3 months.

## 2013-12-10 NOTE — Progress Notes (Signed)
Pre visit review using our clinic review tool, if applicable. No additional management support is needed unless otherwise documented below in the visit note/SLS  

## 2013-12-10 NOTE — Assessment & Plan Note (Signed)
Fair control, he wishes to focus on counseling.  I advised him to continue lexapro for now.  He does not wish to return to the  dose.  He would like to continue the  dose.  We did discuss alternative med i.e. vibryd.but he declines. 15 minutes spent with pt today.  >50% of this time was spent counseling pt on depression.

## 2013-12-11 ENCOUNTER — Ambulatory Visit: Payer: 59 | Admitting: Physician Assistant

## 2013-12-13 ENCOUNTER — Ambulatory Visit: Payer: 59 | Admitting: Physician Assistant

## 2013-12-20 ENCOUNTER — Ambulatory Visit (INDEPENDENT_AMBULATORY_CARE_PROVIDER_SITE_OTHER): Payer: Self-pay | Admitting: Family Medicine

## 2013-12-20 VITALS — BP 110/82 | Wt 189.6 lb

## 2013-12-20 DIAGNOSIS — E1165 Type 2 diabetes mellitus with hyperglycemia: Secondary | ICD-10-CM

## 2013-12-20 NOTE — Progress Notes (Signed)
Subjective:  Patient presents today for 3 month diabetes follow-up as part of the employer-sponsored Link to Wellness program. Current diabetes regimen includes Lantus 15 units twice daily, Humalog 5-10 units with meals, metformin 1000 mg twice daily. Patient also continues on daily ACEi. No major health changes at this time. Patient recently self titrated Lantus up to 15 units twice daily (was on 13 units twice daily) because of worsening blood sugars.  Last visit with Dr. Elvera LennoxGherghe was in July. Next appointment with Elvera LennoxGherghe is October 23rd. Last A1C in epic is 10.1%. Last A1C in the LTW program, A1C was 9.5% in August.  Patient has a flat affect, although did smile a couple of times during the visit. Patient is an outpatient therapist. He leaves the house at 8 AM and doesn't return until 7:30 PM, sometimes after 8 PM. He lives with his wife and daughter and son (children are [id number] and [id number]). His wife does most of the cooking at home. He states that his wife is on the fast metabolism diet and she has been successful with weight loss. He is not eating the same food as his wife. He is working four days a week and his off day is Friday. Other demands on his time- church on Sunday mornings, has been thinking about going to a group on Sunday evenings. His daughter is graduated from high school and is working. Children are not involved in sports. No other major demands on time. Patient states that he did not want to come see a pharmacist today, he thought he was coming to see a nurse case Production designer, theatre/television/filmmanager. He states that he does not want someone to go over medications with him, he wants someone to "hold his feet to the fire" and hold him accountable for lifestyle changes.    Disease Assessments:  Diabetes: Year of diagnosis 2001; Type of Diabetes: Type 2; Sees Diabetes provider 4 or more times per year; does not take an aspirin a day cannot take aspirinstomach issues; uses glucometer True Result; Diabetes Education 03/07/13  Completed classes at [facility]; What are the signs of hyperglycemia? High blood glucose; checks feet daily; What are the signs and symptoms of hypoglycemia? Levada DyShaky, Sweaty; MD managing Diabetes Dallie PilesMartin PA;Gerghe Endo;   checks blood glucose 3-4 times a day 4-5 times a day; takes medications as prescribed Forgets if he takes his Humalog and forgets evening Lantus at times Has improved this last month but still has times he forgets to take; What is target A1c? 7.0 %;   Highest CBG 417 before supper.  Lowest CBG 71  ; hypoglycemia frequency rarely    Patient is diligent with checking blood sugar three times daily and brought his log with him today. He did not bring the meter with him and I did not have time to calculate averages. Fasting blood sugars are elevated and are in the high 100s- low 200s. Pre lunch is running 150-250s, pre dinner is 200-300s. He states that before dinner is the worst because he sometimes will snack in the afternoon and doesn't cover this with any insulin. He states that he has gotten better about remembering to take Lantus- he estimates that he is missing one dose every 10 days. He denies hypoglycemia lately. He states that when he does go low he corrects with juice or milk.                Home Environment: stable  Occupation: mental health counselor  Religion: [employer]  Physical Activity-  He has been in a new position at work for almost 2 months now. He states that it is hard to find time to exercise. He has a 55 minute commute. 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 He had a steroid shot for that August 11th. He reports that it did not help with the pain. He has some numbness in his right arm. He doesn't feel like the pain is limiting him to exercise.  He has been walking outside some. He is also going to the gym occasionally and using the elliptical. Sometimes he does yard work. In the last week he hasn't exercised at all. He  goes in spurts. Before he got the steroid injection in his neck he was walking 5-6 days a week. Since August he has not been exercising because he does not want to aggravate the pain.  Nutrition: Patient had to leave abruptly for another appointment. Did not have time to discuss nutrition.    Assessment/Plan: Patient is a 48 year old male with DM2. Most recent A1C in Epic is 10.1%, which is above goal of less than 7%. Patient has a pending appointment in 3 weeks with Dr. Elvera Lennox. I will defer A1C testing to that visit. Patient comes to the appointment frustrated. He did not want to see another pharmacist for diabetes care and wanted to see a nurse case manager. He states that he needs someone to hold him accountable for making changes, especially with nutrition and physical activity. Patient agreed to see me today but the visit was cut short because he had another appointment to attend.  Spent the majority of the visit with patient discussing exercise and physical activity goals. Before his steroid injection in his neck he was walking for an hour most days of the week. Over the past few months he has not been engaging in any physical activity. He agreed to exercise 2 days of the week when he is working and then again on the days he is off (Friday and Saturday).   Patient is keeping a detailed CBG log. Fasting CBGs have been elevated in the 150s-250s for the past few weeks, with some readings in the 300s. This prompted the patient to self titrate Lantus to 15 units twice daily. Since doing that a few days ago he has had one fasting CBG of 71, the rest were over 100. I advised patient that a fasting goal was around 100 (range 80-120) and to continue checking CBG. If fasting CBG consistently stayed below 100 he may need to back off of Lantus by 1-2 units.   Follow up with patient in 3 months. I offered patient the opportunity to see a nurse case manager and he declined and agreed to see me for follow up.  Patient has appointment with endocrinologist in 3 weeks.   Teaching Goals:  Diabetes Mellitus: Other  Goals for Next Visit- 1. Come up with a plan for what to do to exercise on the days that you are working. Options are walking outside, going to the gym. 2. Schedule the days that you are going to exercise. Pick two days that you are working to exercise and then also exercise on your days off, Fridays and Saturdays. 3. Look into an elliptical machine. 4. Keep checking blood sugars- if you continue to have low blood sugars in the morning (less than 100), you may need to back down on your Lantus dose to 13 or 14 units. Next appointment to  come see me is Friday December 4th at 1:30 PM.

## 2013-12-23 ENCOUNTER — Encounter: Payer: Self-pay | Admitting: Internal Medicine

## 2013-12-23 ENCOUNTER — Other Ambulatory Visit: Payer: Self-pay | Admitting: *Deleted

## 2013-12-23 MED ORDER — METFORMIN HCL 1000 MG PO TABS: 1000.0000 mg | ORAL_TABLET | Freq: Two times a day (BID) | ORAL | Status: DC

## 2014-01-10 ENCOUNTER — Ambulatory Visit (INDEPENDENT_AMBULATORY_CARE_PROVIDER_SITE_OTHER): Payer: 59 | Admitting: Internal Medicine

## 2014-01-10 ENCOUNTER — Encounter: Payer: Self-pay | Admitting: Internal Medicine

## 2014-01-10 VITALS — BP 96/58 | HR 106 | Temp 97.7°F | Resp 12 | Wt 189.6 lb

## 2014-01-10 DIAGNOSIS — IMO0001 Reserved for inherently not codable concepts without codable children: Secondary | ICD-10-CM

## 2014-01-10 DIAGNOSIS — E1165 Type 2 diabetes mellitus with hyperglycemia: Secondary | ICD-10-CM

## 2014-01-10 MED ORDER — INSULIN LISPRO 100 UNIT/ML (KWIKPEN): 5.0000 [IU] | PEN_INJECTOR | Freq: Three times a day (TID) | SUBCUTANEOUS | Status: DC

## 2014-01-10 MED ORDER — INSULIN GLARGINE 100 UNIT/ML SOLOSTAR PEN: 16.0000 [IU] | PEN_INJECTOR | Freq: Two times a day (BID) | SUBCUTANEOUS | Status: DC

## 2014-01-10 NOTE — Progress Notes (Signed)
Patient ID: Nathaniel [name], male   DOB: 1965/07/20, 48 y.o.   MRN: [id number]  HPI: Nathaniel [name] is a 48 y.o.-year-old man, returning for f/u for DM2, dx 2001, insulin-dependent since 2010, uncontrolled, without complications. Last visit 3 mo ago.  Last hemoglobin A1c was: 11/08/2013: 9.5% Lab Results  Component Value Date   HGBA1C 10.1* 07/30/2013   HGBA1C 9.5* 12/19/2012   HGBA1C 9.7* 02/17/2012  04/11/2013: 9.8%  Pt is on a regimen of: - Metformin 1000 mg po bid - Lantus 16 units in am and 14 units before dinner (<< once a day as he was dropping his sugars at night) - Humalog insulin to carb ratio: 1:15 but when has less active days, change to 1:10 - target sugar 130 - insulin sensitivity factor 25: - 130-155: + 1 unit  - 156-180: + 2 units  - 181-205: + 3 units  - 206-230: + 4 units  - >231: + 5 units When correcting sugars at bedtime, he was instructed to inject less sliding scale (by 2 units).  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  He can forget his insulin doses or Metformin.  Pt checks his sugars 3-4 a day and they are (+ log): - am: 220s when on steroids, 80-150 >> 78-241 >> 71-176 (190, 244) >> 103-242 >> 95-180, occasionally 200s - 2h after b'fast: n/c >> 191-222 >> n/c >> 86-289 >> n/c - before lunch: 80-240 (misjudging the Humalog) >> 130-317 >> 97-290, most <200 >> 197-217 >> 180-250s - 2h after lunch: n/c >> 86-302 >> 122-236 >> 106-503 >> 122-173 - before dinner: 250s (100s-300s) >> 129-292 >> 67-260 >> 75-303 >> 98-242, 335 - 2h after dinner: n/c >> 101-337 >> 66, 102-197 - bedtime: 160-180 >> n/c >> 357 - nighttime: checks if feels low: 50-60s once every 2 weeks >> n/c but one 48 >> n/c  Pt's meals are: - Breakfast: cereal + 1% milk, with protein bar - Lunch: sandwich - Dinner: eating out or eating in - Snacks: 1-2 a day He has a low appetite. Walks 3-4x a week. [employer] [place] a lot for his job.  - no CKD, last  BUN/creatinine:  Lab Results  Component Value Date   BUN 12 05/20/2013   CREATININE 0.90 05/20/2013  He is on Lisinopril. - last set of lipids: Lab Results  Component Value Date   CHOL 181 06/12/2013   HDL 65 06/12/2013   LDLCALC 103* 06/12/2013   TRIG 66 06/12/2013   CHOLHDL 2.8 06/12/2013  - last eye exam was this year. No DR.  - no numbness and tingling in his feet. He has a place on the lateral R foot >> pain.   I reviewed pt's medications, allergies, PMH, social hx, family hx and no changes required, except as mentioned above.  ROS: Constitutional: no weight gain/loss, + fatigue, no subjective hyperthermia/hypothermia Eyes: no blurry vision, no xerophthalmia ENT: no sore throat, no nodules palpated in throat, no dysphagia/odynophagia, no hoarseness Cardiovascular: no CP/SOB/palpitations/leg swelling Respiratory: no cough/no SOB Gastrointestinal: no N/V/D/C Musculoskeletal: no muscle/joint aches Skin: no rashes Neurological: no tremors/numbness/tingling/dizziness  PE: BP 96/58  Pulse 106  Temp(Src) 97.7 F (36.5 C) (Oral)  Resp 12  Wt 189 lb 9.6 oz (86.002 kg)  SpO2 97% Wt Readings from Last 3 Encounters:  01/10/14 189 lb 9.6 oz (86.002 kg)  12/20/13 189 lb 9.6 oz (86.002 kg)  12/10/13 187 lb 2 oz (84.879 kg)  Constitutional: normal weight, in NAD Eyes: PERRLA, EOMI, no exophthalmos ENT: moist mucous membranes, no thyromegaly, no cervical lymphadenopathy Cardiovascular: RRR, No MRG Respiratory: CTA B Gastrointestinal: abdomen soft, NT, ND, BS+ Musculoskeletal: no deformities, strength intact in all 4 Skin: moist, warm, no rashes Neurological: no tremor with outstretched hands, DTR normal in all 4  ASSESSMENT: 1. DM2, insulin-dependent, uncontrolled, without complications  PLAN:  1. Patient with long-standing, uncontrolled diabetes, on basal-bolus insulin, plus Metformin. His sugars are worse before lunch and he also has highs in am, but not as high. >> I suggested  to add 2 more units to b'fast Humalog dose (as it is more difficult for him to change the ICR only with that meal). I also advised him to increase Lantus in a nonactive day to 16 units: Patient Instructions  - Please continue Metformin 1000 mg 2x a day - Continue Lantus 16 units in am and 14 units at bedtime after an active day, but increase to 16 units at bedtime after a less active day. - Continue: - Humalog insulin to carb ratio: 1:10 but in a less active day, 1:15 - target sugar 130 - insulin sensitivity factor 25: - 130-155: + 1 unit  - 156-180: + 2 units  - 181-205: + 3 units  - 206-230: + 4 units  - >231: + 5 units Please add 2 more Humalog units with breakfast.  Please return in 3 months with your sugar log.   - continue checking sugars at different times of the day - check 3-4 times a day, rotating checks - UTD with yearly eye exams  - Return to clinic in 3 mo with sugar log - will check HbA1c then

## 2014-01-10 NOTE — Patient Instructions (Addendum)
-   Please continue Metformin 1000 mg 2x a day - Continue Lantus 16 units in am and 14 units at bedtime after an active day, but increase to 16 units at bedtime after a less active day. - Continue: - Humalog insulin to carb ratio: 1:10 but in a less active day, 1:15 - target sugar 130 - insulin sensitivity factor 25: - 130-155: + 1 unit  - 156-180: + 2 units  - 181-205: + 3 units  - 206-230: + 4 units  - >231: + 5 units Please add 2 more Humalog units with breakfast.  Please return in 3 months with your sugar log.

## 2014-01-20 ENCOUNTER — Other Ambulatory Visit: Payer: Self-pay | Admitting: Family

## 2014-02-04 NOTE — Progress Notes (Signed)
Patient ID: Nathaniel LukesJeffrey A [name], male   DOB: 09-27-1965, 48 y.o.   MRN: [id number] Reviewed: Agree with the documentation and management of our [facility].

## 2014-02-11 ENCOUNTER — Other Ambulatory Visit: Payer: Self-pay | Admitting: Internal Medicine

## 2014-02-21 ENCOUNTER — Ambulatory Visit (INDEPENDENT_AMBULATORY_CARE_PROVIDER_SITE_OTHER): Payer: Self-pay | Admitting: Family Medicine

## 2014-02-21 VITALS — BP 98/60 | Wt 188.8 lb

## 2014-02-21 DIAGNOSIS — E1165 Type 2 diabetes mellitus with hyperglycemia: Secondary | ICD-10-CM

## 2014-02-21 NOTE — Assessment & Plan Note (Signed)
Subjective:  Patient presents today for 2 month diabetes follow-up as part of the employer-sponsored Link to Wellness program. Current diabetes regimen includes Lantus 16 units SQ twice daily, Humalog 5-10 units SQ with meals, metformin 1000 mg BID. Patient also continues on daily ACEi. No med changes or major health changes at this time.   Last visit with Elvera LennoxGherghe was in October. No A1C done at that visit. I will check A1C today. In terms of blood sugar he states that he does best in the morning and doesn't do as well in the afternoon or evening. He has joined a gym since his last appointment and he has started in regular physical activity (this is an improvement from his last appointment with me). No medication changes.   Advanced Care Planning: Date Discussed 01/03/2013; No Living Will in place; No Advanced Directive; Information was given  Disease Assessments:  Diabetes: Year of diagnosis 2001; Type of Diabetes: Type 2; Sees Diabetes provider 4 or more times per year; does not take an aspirin a day cannot take aspirinstomach issues; uses glucometer True Result; Diabetes Education 03/07/13 [facility]; What are the signs of hyperglycemia? High blood glucose; checks feet daily; What are the signs and symptoms of hypoglycemia? Levada DyShaky, Sweaty; MD managing Diabetes Dallie PilesMartin PA;[facility] Endo; checks blood glucose 3-4 times a day 4-5 times a day; takes medications as prescribed Forgets if he takes his Humalog and forgets evening Lantus at times Has improved this last month but still has times he forgets to take; What is target A1c? 7.0 %;   7 day CBG average 203; 14 day CBG average 210; 30 day CBG average 214; Highest CBG 398; hypoglycemia frequency rarely; Lowest CBG 62;   Other Diabetes History:   Patient states that he is still struggling with forgetting to take Lantus and is missing a dose about once a week. He denies missing doses of Humalog. He denies  hypoglycemia.  Reviewed patient's CBG meter and CBG log. Fasting readings for the last month are ranging 68-329. Most are less than 180. Lunch readings are running in the high 100s, low 200s. At his last appointment with Dr. Elvera LennoxGherghe she advised him to add 2 more units with breakfast. He states that he hasn't done this for fear of going low while he is working and seeing clients.  A1C today was 9.2%.            Tobacco Assessment: Smoking Status: Never smoker; Last Reviewed: 12/20/2013   Social History:  Denies alcohol use; No drug use; Caffeine use: soda 10 per day Daily; Social work consult was not done; Patient knows the purpose/use of medications; 150 minutes of exercise per week; Exercise adherence 3-4 days a week; Patient can afford medications; Diet adherence 50-75% of the time; Medication adherence adherent, non adherent He misses Lantus dose about once a week. Date of Assessment: 02/21/2014.  Home Environment: stable  Occupation: mental health counselor  Religion: Christian  Physical Activity-  He reports that the pain/numbness in his right arm is stable. He has started going to the gym and is going 3-4 times a week. He is doing a combination of cardio and weight lifting. He is getting on the treadmill and using the elliptical. He is also doing some weight lifting- focusing on leg work and also upper body (but not using a lot of weight as to not injure his neck). He is getting 150 minutes of physical activity.  Nutrition: He states that he "  eats like a 48 year old without diabetes". He states that this means he eats too many sandwiches and chips. He states that he is doing better than he was before. He is now bringing lunch from home. He has a mini fridge in his office and this has cut down on convenience foods. He doesn't get home until late at night from work and is snacking more. He is snacking in the mid day on a bag of chips or a piece of fruit (apple). He sometimes isn't eating  until 9 PM when he gets back from the gym. He is eating leftovers usually for dinner. He states that he usually isn't hungry.   He was eating a [place] protein bar in the morning and that was helping to keep him full. Now he is eating sandwich or a bowl of cereal.    Preventive Care:  Last Complete Physical: 12/19/2012  Hemoglobin A1c: 02/21/2014 via POCT 9.2  Blood Glucose 11/08/2013    Dilated Eye Exam: 10/28/2013  Flu vaccine: allergic to eggs  Foot Exam: 12/19/2012  Lipid Panel: 06/12/2013  Prostate Exam 12/19/2012      Vital Signs:  02/21/2014 4:40 PM (EST)Blood Pressure 98 / 60 mm/HgBMI 25.9; Height 5 ft 11.5 in; Weight 188 lbs   Testing:  Blood Sugar Tests: Hemoglobin A1c: 9.2 via POCT resulted on 02/21/2014   Historical Lab Results:  Triglycerides 06/12/2013 Result: 70; LDL Cholesterol 06/12/2013 Result: 96; Urine Microalbumin: 12/19/12; Total Cholesterol 06/12/2013 Result: 168; HDL Cholesterol 06/12/2013 Result: 58   Care Planning:  Learning Preference Assessment:  Learner: Patient  Readiness to Learn Barriers: None  Teaching Method: Demonstration, Explanation, Handout  Evaluation of Learning: Can function independently and verbalize knowledge  Care Plan:  02/21/2014 4:40 PM (EST) (1)  Problem: Lack of physical activity  Long Term Goal Exercise four days during the week. patient is exercising 3-4 times a week. Most weeks he is egtting 150 minutes.  Date Started: 12/20/2013     Assessment/Plan: Patient is a 48 year old male with DM2. A1C today is 9.2% which is above goal of less than 7%. This is an improvement from his last A1C of 9.5% in August. Patient continues to struggle with missing Lantus doses. His son also has diabetes and sometimes he states that they use insulin from the same pen. He states that he is trying to do better but still misses a dose about once a week. He is up to 16 units twice daily Lantus. He has split it up into 2 doses because he was  going low overnight. I asked patient what he thought he could do to improve glycemic control. He thinks that if he had more long acting insulin with the morning dose that would help with mid day and evening blood sugar. He has been hesitant to increase Humalog dose with breakfast because he is worried about hypoglycemia. After discussing with him he agreed to add 1 extra unit of Humalog with breakfast if fasting reading was >200 mg/dL. With him lowering evening Lantus doses I cautioned that if fasting blood sugars increased he may need to increase evening Lantus dose by 1-2 units.  He has done great with physical activity and is getting 150 minutes of physical activity during the week.  He states that he feels he could improve his eating habits. He felt he was doing better when he was eating a protein bar for breakfast. He wants to go back to doing that and made it one of  his goals for next time.  I will fax A1C results to Dr. Elvera LennoxGherghe.  Goals for Next Visit- 1. Go back to eating the protein bars for breakfast in the morning. 2. Try splitting Lantus so that you are taking a larger portion of the Lantus dose in the morning and a smaller portion in the evening. For example, try 18 units in the morning and 14 units in the evening. 3. Continue physical activity. Next appointment to see me is Friday February 12th at 9 AM.

## 2014-03-24 ENCOUNTER — Other Ambulatory Visit: Payer: Self-pay | Admitting: Family

## 2014-03-24 ENCOUNTER — Other Ambulatory Visit: Payer: Self-pay | Admitting: Internal Medicine

## 2014-03-25 NOTE — Telephone Encounter (Signed)
30 day supply of Lexapro sent to pharmacy. Pt last seen 12/10/13 and advised 3 month follow up. Pt is past due and will need to be seen before further refills can be given. Please call pt to arrange.

## 2014-03-25 NOTE — Telephone Encounter (Signed)
Left detailed message informing patient of this. °

## 2014-03-29 ENCOUNTER — Encounter: Payer: Self-pay | Admitting: Internal Medicine

## 2014-04-03 NOTE — Progress Notes (Signed)
Patient ID: Rober A Dimiceli, male   DOB: 03/06/1966, 48 y.o.   MRN: [id number] Reviewed: Agree with the documentation and management of our [place] Pharmacologist.   

## 2014-04-11 ENCOUNTER — Ambulatory Visit: Payer: 59 | Admitting: Internal Medicine

## 2014-05-02 ENCOUNTER — Other Ambulatory Visit: Payer: Self-pay

## 2014-05-02 ENCOUNTER — Ambulatory Visit (INDEPENDENT_AMBULATORY_CARE_PROVIDER_SITE_OTHER): Payer: Self-pay | Admitting: Family Medicine

## 2014-05-02 ENCOUNTER — Other Ambulatory Visit: Payer: Self-pay | Admitting: Internal Medicine

## 2014-05-02 ENCOUNTER — Telehealth: Payer: Self-pay

## 2014-05-02 ENCOUNTER — Other Ambulatory Visit: Payer: Self-pay | Admitting: Physician Assistant

## 2014-05-02 VITALS — BP 100/70 | Wt 190.4 lb

## 2014-05-02 DIAGNOSIS — E1165 Type 2 diabetes mellitus with hyperglycemia: Secondary | ICD-10-CM

## 2014-05-02 MED ORDER — LANCETS MICRO THIN 33G MISC: Status: DC

## 2014-05-02 MED ORDER — ALCOHOL PREPS PADS: MEDICATED_PAD | Status: DC

## 2014-05-02 MED ORDER — BAYER CONTOUR NEXT USB MONITOR W/DEVICE KIT: PACK | Status: DC

## 2014-05-02 MED ORDER — GLUCOSE BLOOD VI STRP
ORAL_STRIP | Status: DC
Start: 2014-05-02 — End: 2014-06-26

## 2014-05-02 NOTE — Assessment & Plan Note (Signed)
Subjective:  Patient presents today for 3 month diabetes follow-up as part of the employer-sponsored Link to Wellness program. Current diabetes regimen includes Lantus 16 units SQ twice daily and Humalog 5-10 units SQ with each meal. Patient also continues on daily ACEi. No med changes or major health changes at this time.   Last appointment with Dr. Cruzita Lederer was about 2 months ago. He has an appointment pending with her in 2 weeks. I will defer A1C testing to that visit. No medication changes since his last appointment with me.  Patient has a very flat affect and admits to being very tired. He has not been sleeping well and wants to be evaluated for sleep apnea. Because he has been so busy at work he continues to miss Lantus doses in the evenings. He also states that often he can't remember if he has taken Humalog before he eats.   Disease Assessments:  Diabetes: Year of diagnosis 2001; Type of Diabetes: Type 2; Sees Diabetes provider 4 or more times per year; does not take an aspirin a day cannot take aspirinstomach issues; uses glucometer True Result; Diabetes Education 03/07/13 [facility]; What are the signs of hyperglycemia? High blood glucose; checks feet daily; What are the signs and symptoms of hypoglycemia? Nathaniel [name]; MD managing Diabetes Alfonse Alpers Endo; checks blood glucose 3-4 times a day 4-5 times a day; takes medications as prescribed Forgets if he takes his Humalog and forgets evening Lantus at times Has improved this last month but still has times he forgets to take; What is target A1c? 7.0 %; hypoglycemia frequency rarely;   7 day CBG average 20; 14 day CBG average 204; 30 day CBG average 212; Highest CBG 395; Lowest CBG 84;   Other Diabetes History:  Patient states that he is still struggling with forgetting to take Lantus and is missing a dose about once a week. He denies missing doses of Humalog. He denies  hypoglycemia.  Patient states that blood sugar is going low about twice a month (down to 50-60s). He reports that he is correcting this with milk or juice.  CBG averages are similar to last visit. The majority of readings are in the high 100s, low 200s. Missing Lantus doses usually results in higher CBGs for the following 1-2 days.  He admits that he and his son are using the same Lantus pen and this is complicating him being able to remember if he has taken Lantus in the evening.     Social History:  Denies alcohol use; No drug use; Caffeine use: soda 10 per day Daily; Social work consult was not done; Patient knows the purpose/use of medications; 150 minutes of exercise per week; Exercise adherence 3-4 days a week; Patient can afford medications; Diet adherence 50-75% of the time;   Medication adherence adherent, non adherent He states that he is having difficulty remembering to take metformin and Lantus for the evening dose. He reports that he has had difficulty remembering if he has taken the Humalog or not. He and his son use the same insulin and often the same insulin pen. He reports that his memory is poor. Date of Assessment: 05/02/2014.  Home Environment: stable  Occupation: mental health counselor  Religion: Christian  Physical Activity-  He reports that the pain/numbness in his right arm has increased since his last visit with me. He reports that he is going to the gym 3-4 times a week and most weeks he estimates that  he is getting 150 minutes of physical activity a week. He reports that he has slacked off somewhat with lifting weights because of the pain in his arm.  Nutrition: We did not have time to discuss this. He has been eating a protein bar in the morning and states that it has helped. Weight is up 2 pounds since his last appointment with me.    Preventive Care:  Last Complete Physical: 12/19/2012  Hemoglobin A1c: 02/21/2014 via POCT 9.2  Blood Glucose 11/08/2013     Dilated Eye Exam: 10/28/2013  Flu vaccine: allergic to eggs  Foot Exam: 12/19/2012  Lipid Panel: 06/12/2013  Prostate Exam 12/19/2012      Vital Signs:  05/02/2014 10:53 AM (EST)Blood Pressure 100 / 70 mm/Hg; Height 5 ft 11.5 in; Weight 190.4 lbs        Care Planning:  Learning Preference Assessment:  Learner: Patient  Readiness to Learn Barriers: None  Teaching Method: Demonstration, Explanation, Handout  Evaluation of Learning: Can function independently and verbalize knowledge  Care Plan:  05/02/2014 10:54 AM (EST) (2)  Problem: Missing evening Lantus dose  Role: Clinical Pharmacist  Long Term Goal Take Lantus at work before you leave to go home. Keep a tally sheet in your meter bag to keep track of your insulin doses.  Date Started: 05/02/2014  05/02/2014 10:53 AM (EST) (1)  Problem: Lack of physical activity  Long Term Goal Exercise four days during the week. patient is exercising 3-4 times a week. Most weeks he is egtting 150 minutes.  Date Started: 12/20/2013  Goal Met Patient is getting 150 minutes physical activity each week. He is not lifting as much weight but is still getting cardio in.     Assessment/Plan: Patient is a 49 year old male with DM2. Most recent A1C in December was 9.2% and is above goal of less than 7%. Patient has an upcoming appointment with Dr. Cruzita Lederer in 2 weeks and I will defer testing to that time. Based on his CBG averages of low 200s I expect that A1C will still be close to 9%. Patient continues to struggle with missing insulin doses, Lantus evening dose in particular. He states that when he takes Lantus once daily he has low blood sugar and splitting the dose eliminated hypoglycemia. However, because he is missing at least one Lantus dose each week this complicated glycemic control as he spends 1-2 days catching up and bolusing with Humalog to get blood sugars back into target range. We came up with a plan to keep a Lantus pen in his desk at work  so that he can give himself a dose before leaving work . I will also create a tally sheet for him to keep in his meter bag so that he can mark when he has taken insulin. He states that even a few minutes after giving himself a dose he will struggle to remember whether or not he actually had a dose of insulin.  Patient may be a candidate for a longer acting insulin such as degludec that can be given once daily in the morning. I asked patient to bring this up at his next appointment with Dr. Cruzita Lederer in a few weeks.  Follow up with patient in 2 months.  Goals for Next Visit- 1. Keep a Lantus pen at work and give yourself a dose of Lantus before you leave work at 7 PM. 2. Keep your insulin pens and your son's insulin pens separate. Also do not share lancets or pen  needles. 3. Ask Dr. Cruzita Lederer about Tyler Aas (this is the long acting insulin). 4. Make an appointment with your primary care doctor to talk about sleep apnea. 5. When you give yourself a dose of insulin, mark it off on the tally sheet in your meter bag. Next appointment to see me is Friday April 22nd at 9 AM.

## 2014-05-02 NOTE — Telephone Encounter (Signed)
erx for b/s testing supplies done (only).

## 2014-05-02 NOTE — Telephone Encounter (Signed)
Fax from [facility] states the following:  1. Patients testing supplies are no longer covered for zero copay. Request for new rx of the following:   Contour Next USB meter, Contour Next test strips, Microlet lancets, alcohol swabs.   2. Patient continues to struggle with missing Lantus does in the evening (he is currently taking Lantus 16 units SQ BID). He previously had low blood sugars when taking Lnatus as a one daily dose. I wonder if he may be a candidate for insulin degludec Evaristo Bury(Tresiba) as this would simplify his regimen and hopefully improve adherence and glycemic control. He has an appointment pending to see you on 05/16/14.

## 2014-05-02 NOTE — Telephone Encounter (Signed)
Left detailed message informing patient of this and to call and schedule appointment °

## 2014-05-02 NOTE — Telephone Encounter (Signed)
Medication Detail      Disp Refills Start End     escitalopram (LEXAPRO) 10 MG tablet 30 tablet 0 03/25/2014     Sig: TAKE 1 TABLET (10 MG TOTAL) BY MOUTH DAILY.    Notes to Pharmacy: PATIENT NEEDS OFFICE VISIT FOR FURTHER REFILLS    E-Prescribing Status: Receipt confirmed by pharmacy (03/25/2014 10:43 AM EST)      Per provider instructions, patient cannot have any further medication without prior Office Visit/SLS Please call patient and arrange Follow-up OV per provider instructions/SLS Thanks.

## 2014-05-02 NOTE — Telephone Encounter (Signed)
OK to change to Contour.  Nathaniel [name] is a good idea, I would like to d/w pt face to face and see his sugars first - he has an appt in 2 weeks. Is this ok with him?

## 2014-05-03 ENCOUNTER — Encounter: Payer: Self-pay | Admitting: Internal Medicine

## 2014-05-05 NOTE — Telephone Encounter (Signed)
Called pt and advised him per Dr Charlean SanfilippoGherghe's note. Advised of appt in 2 weeks on 2/26. Advised rx for testing supplies sent to pt's pharmacy.

## 2014-05-06 ENCOUNTER — Emergency Department (HOSPITAL_COMMUNITY)
Admission: EM | Admit: 2014-05-06 | Discharge: 2014-05-06 | Disposition: A | Discharge status: Home / Self Care | Payer: 59 | Source: Home / Self Care | Attending: Family Medicine | Admitting: Family Medicine

## 2014-05-06 ENCOUNTER — Encounter (HOSPITAL_COMMUNITY): Payer: Self-pay | Admitting: Emergency Medicine

## 2014-05-06 ENCOUNTER — Emergency Department (INDEPENDENT_AMBULATORY_CARE_PROVIDER_SITE_OTHER): Payer: 59

## 2014-05-06 DIAGNOSIS — S92402A Displaced unspecified fracture of left great toe, initial encounter for closed fracture: Secondary | ICD-10-CM

## 2014-05-06 DIAGNOSIS — S20212A Contusion of left front wall of thorax, initial encounter: Secondary | ICD-10-CM

## 2014-05-06 DIAGNOSIS — IMO0001 Reserved for inherently not codable concepts without codable children: Secondary | ICD-10-CM

## 2014-05-06 DIAGNOSIS — S93402A Sprain of unspecified ligament of left ankle, initial encounter: Secondary | ICD-10-CM

## 2014-05-06 NOTE — ED Notes (Signed)
Reports falling on ice yesterday injury left rib area and twisting left foot swelling and bruising noted medial side of foot along great toe..  States pain has become worse today.

## 2014-05-06 NOTE — Discharge Instructions (Signed)
He fractured your left great toe. This will need orthopedic follow-up. Please call Dr. Ophelia CharterYates office, or another orthopedic office if you have preference, tomorrow to set up an appointment within 1 week. Please continue taking 1-2 Aleve twice a day for pain. Please apply ice intermittently for pain and swelling. He also suffered a left rib bruise which should take days to weeks to heal.  Please focus on tight diabetic control as well.

## 2014-05-06 NOTE — ED Provider Notes (Signed)
CSN: [id number]     Arrival date & time 05/06/14  1943 History   First MD Initiated Contact with Patient 05/06/14 2109     Chief Complaint  Patient presents with  . Fall   (Consider location/radiation/quality/duration/timing/severity/associated sxs/prior Treatment) HPI  Fall: occurred 24 hrs ago. Slipped on ice. Landed on L side. Not sure what hit first. L foot and side were immediately painful. " got the wind knocked out of me." swelling started shortly after. Discoloration of foot started today. Walking all day at work made it worse. Tried aleve w/o benefit. Able to take full breaths w/o severe rib pain. Sensation and movement in toes intact. Pain is constant. Symptoms getting worse due to new discoloration.   DM: Last A1c 9.2 1 mo ago.    Past Medical History  Diagnosis Date  . Depression   . Hypogonadism male   . Headache(784.0)   . Diabetes mellitus     type II  . GERD (gastroesophageal reflux disease)    Past Surgical History  Procedure Laterality Date  . Tonsillectomy  1972  . Nasal sinus surgery  04/1991   Family History  Problem Relation Age of Onset  . Adopted: Yes  . Cancer Father 42    Prostate cancer  . Healthy Mother   . Diabetes Mellitus I Son    History  Substance Use Topics  . Smoking status: Never Smoker   . Smokeless tobacco: Never Used  . Alcohol Use: No    Review of Systems Per HPI with all other pertinent systems negative.   Allergies  Eggs or egg-derived products; Penicillins; Pioglitazone; Promethazine hcl; and Reglan  Home Medications   Prior to Admission medications   Medication Sig Start Date End Date Taking? Authorizing Provider  escitalopram (LEXAPRO) 10 MG tablet TAKE 1 TABLET (10 MG TOTAL) BY MOUTH DAILY. 05/02/14  Yes Brunetta Jeans, PA-C  gabapentin (NEURONTIN) 300 MG capsule  12/20/13  Yes Historical Provider, MD  glucagon (GLUCAGON EMERGENCY) 1 MG injection Inject 1 mg into the muscle once as needed. 02/17/12  Yes Burnice Logan, MD  Insulin Glargine (LANTUS) 100 UNIT/ML Solostar Pen Inject 16 Units into the skin 2 (two) times daily. 01/10/14  Yes Philemon Kingdom, MD  insulin lispro (HUMALOG KWIKPEN) 100 UNIT/ML KiwkPen Inject 0.05-0.1 mLs (5-10 Units total) into the skin 3 (three) times daily. 01/10/14  Yes Philemon Kingdom, MD  lisinopril (PRINIVIL,ZESTRIL) 2.5 MG tablet TAKE 1 TABLET BY MOUTH ONCE DAILY 01/20/14  Yes Debbrah Alar, NP  metFORMIN (GLUCOPHAGE) 1000 MG tablet TAKE 1 TABLET (1,000 MG TOTAL) BY MOUTH 2 (TWO) TIMES DAILY WITH A MEAL. *PT NEEDS APPT FOR FURTHER REFILLS* 05/02/14  Yes Philemon Kingdom, MD  Multiple Vitamins-Minerals (MULTIVITAMIN & MINERAL PO) Take 1 tablet by mouth daily.   Yes Historical Provider, MD  Alcohol Swabs (ALCOHOL PREPS) PADS Use alcohol pads to clean site to be used to check blood sugar. 05/02/14   Philemon Kingdom, MD  Blood Glucose Monitoring Suppl (BAYER CONTOUR NEXT USB MONITOR) W/DEVICE KIT Use kit as directed 05/02/14   Philemon Kingdom, MD  glucose blood test strip Use as instructed 05/02/14   Philemon Kingdom, MD  LANCETS MICRO THIN 33G MISC Use lancet to test blood sugar as directed by your physician. 05/02/14   Philemon Kingdom, MD   BP 104/72 mmHg  Pulse 72  Temp(Src) 97.6 F (36.4 C) (Oral)  Resp 16  SpO2 100% Physical Exam  Constitutional: He appears well-developed and well-nourished. No distress.  HENT:  Head: Normocephalic and atraumatic.  Eyes: EOM are normal. Pupils are equal, round, and reactive to light.  Neck: Normal range of motion.  Cardiovascular: Normal rate and normal heart sounds.   No murmur heard. Pulmonary/Chest: Effort normal and breath sounds normal. No respiratory distress.  Abdominal: Soft. Bowel sounds are normal. He exhibits no distension.  Musculoskeletal:  Left rib tenderness to palpation along the axillary line at approximately the fifth through seventh ribs. No point tenderness. Able to take deep respirations without  significant pain. Left foot with minimal swelling the proximal third and fourth metatarsals at the insertion site of the ATFL. Left great toe with proximal medial tenderness at the first MTP. Minimal skin discoloration and swelling.  Skin: Skin is warm and dry. He is not diaphoretic.  Psychiatric: He has a normal mood and affect. Judgment and thought content normal.    ED Course  Procedures (including critical care time) Labs Review Labs Reviewed - No data to display  Imaging Review Dg Foot Complete Left  05/06/2014   CLINICAL DATA:  Patient status post fall on driveway. Great toe bruising.  EXAM: LEFT FOOT - COMPLETE 3+ VIEW  COMPARISON:  None.  FINDINGS: There is a minimally displaced oblique fracture through the proximal medial aspect of the proximal phalanx of the great toe. There is intra-articular extension. Mild overlying soft tissue swelling. No evidence for associated acute fractures.  IMPRESSION: Minimally displaced oblique fracture through the proximal medial aspect of the proximal phalanx of the great toe with intra-articular extension.   Electronically Signed   By: Lovey Newcomer M.D.   On: 05/06/2014 21:16     MDM   1. Grade 1 ankle sprain, left, initial encounter   2. Fracture of great toe, left, closed, initial encounter   3. Rib contusion, left, initial encounter    Grade 1 ankle sprain with likely mild ATFL sprain and tear. Fracture noted as outlined in radiology report as above. 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 A sheet to follow-up with Dr. Rodell Perna of [facility] orthopedics. Will call office tomorrow to set up appointment. Left rib contusion without discrete evidence of fracture. Apply ice, continue NSAIDs.  Precautions given and all questions answered  Linna Darner, MD Family Medicine 05/06/2014, 9:31 PM      Waldemar Dickens, MD 05/06/14 [phone]

## 2014-05-16 ENCOUNTER — Encounter: Payer: Self-pay | Admitting: Internal Medicine

## 2014-05-16 ENCOUNTER — Ambulatory Visit (INDEPENDENT_AMBULATORY_CARE_PROVIDER_SITE_OTHER): Payer: 59 | Admitting: Internal Medicine

## 2014-05-16 VITALS — BP 108/64 | HR 98 | Temp 97.6°F | Resp 12 | Wt 194.0 lb

## 2014-05-16 DIAGNOSIS — E1165 Type 2 diabetes mellitus with hyperglycemia: Secondary | ICD-10-CM

## 2014-05-16 DIAGNOSIS — IMO0001 Reserved for inherently not codable concepts without codable children: Secondary | ICD-10-CM

## 2014-05-16 LAB — HEMOGLOBIN A1C: Hgb A1c MFr Bld: 10.4 % — ABNORMAL HIGH (ref 4.6–6.5)

## 2014-05-16 MED ORDER — INSULIN GLARGINE 300 UNIT/ML ~~LOC~~ SOPN: 30.0000 [IU] | PEN_INJECTOR | Freq: Every day | SUBCUTANEOUS | Status: DC

## 2014-05-16 NOTE — Progress Notes (Signed)
Patient ID: Nathaniel LukesJeffrey A Gelder, male   DOB: Dec 19, 1965, 49 y.o.   MRN: [id number]  HPI: Nathaniel [name] is a 49 y.o.-year-old man, returning for f/u for DM2, dx 2001, insulin-dependent since 2010, uncontrolled, without complications. Last visit 3 mo ago.  He broke his L toe 05/05/2014.  Last hemoglobin A1c was: 11/08/2013: 9.5% Lab Results  Component Value Date   HGBA1C 10.1* 07/30/2013   HGBA1C 9.5* 12/19/2012   HGBA1C 9.7* 02/17/2012  04/11/2013: 9.8%  Pt is on a regimen of: - Metformin 1000 mg po bid - Lantus 16 units in am and 14 units at bedtime after an active day, but increase to 16 units at bedtime after a less active day. - Humalog insulin to carb ratio: 1:15 but when has less active days, change to 1:10. Add 2 units for b'fast. - target sugar 130 - insulin sensitivity factor 25: - 130-155: + 1 unit  - 156-180: + 2 units  - 181-205: + 3 units  - 206-230: + 4 units  - >231: + 5 units When correcting sugars at bedtime, he was instructed to inject less sliding scale (by 2 units).  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  He can forget his insulin doses or Metformin.  Pt checks his sugars 3-4 a day and they are (no log, we downloaded his meter): - am:71-176 (190, 244) >> 103-242 >> 95-180, occasionally 200s >> 72-259 - 2h after b'fast: n/c >> 191-222 >> n/c >> 86-289 >> n/c >> 86-170, 338 - before lunch: 130-317 >> 97-290, most <200 >> 197-217 >> 180-250s >> 97-390 - 2h after lunch: n/c >> 86-302 >> 122-236 >> 106-503 >> 122-173 >> 83-302 - before dinner: 250s (100s-300s) >> 129-292 >> 67-260 >> 75-303 >> 98-242, 335 >> 130-342 - 2h after dinner: n/c >> 101-337 >> 66, 102-197 >> 159-342 - bedtime: 160-180 >> n/c >> 357 >> see above Lows: 70.  Pt's meals are: - Breakfast: cereal + 1% milk, with protein bar - Lunch: sandwich - Dinner: eating out or eating in - Snacks: 1-2 a day He has a low appetite. Walks 3-4x a week. [employer] a  lot for his job.  - no CKD, last BUN/creatinine:  Lab Results  Component Value Date   BUN 12 05/20/2013   CREATININE 0.90 05/20/2013  He is on Lisinopril. - last set of lipids: Lab Results  Component Value Date   CHOL 181 06/12/2013   HDL 65 06/12/2013   LDLCALC 103* 06/12/2013   TRIG 66 06/12/2013   CHOLHDL 2.8 06/12/2013  - last eye exam was 10/2013. No DR.  - no numbness and tingling in his feet. He has a place on the lateral R foot >> pain.   I reviewed pt's medications, allergies, PMH, social hx, family hx, and changes were documented in the history of present illness. Otherwise, unchanged from my initial visit note. Stopped Lexapro.  ROS: Constitutional: no weight gain/loss, + fatigue, no subjective hyperthermia/hypothermia, + poor sleep Eyes: no blurry vision, no xerophthalmia ENT: no sore throat, no nodules palpated in throat, no dysphagia/odynophagia, no hoarseness Cardiovascular: no CP/SOB/palpitations/leg swelling Respiratory: + cough/no SOB Gastrointestinal: no N/V/D/C Musculoskeletal: no muscle/joint aches Skin: no rashes Neurological: no tremors/numbness/tingling/dizziness  PE: BP 108/64 mmHg  Pulse 98  Temp(Src) 97.6 F (36.4 C) (Oral)  Resp 12  Wt 194 lb (87.998 kg)  SpO2 97% Body mass index is 26.68 kg/(m^2).  Wt Readings from Last 3  Encounters:  05/16/14 194 lb (87.998 kg)  05/02/14 190 lb 6.4 oz (86.365 kg)  02/21/14 188 lb 12.8 oz (85.639 kg)   Constitutional: normal weight, in NAD, as usual, appears very depressed Eyes: PERRLA, EOMI, no exophthalmos ENT: moist mucous membranes, no thyromegaly, no cervical lymphadenopathy Cardiovascular: RRR, No MRG Respiratory: CTA B Gastrointestinal: abdomen soft, NT, ND, BS+ Musculoskeletal: no deformities, strength intact in all 4 Skin: moist, warm, no rashes Neurological: no tremor with outstretched hands, DTR normal in all 4  ASSESSMENT: 1. DM2, insulin-dependent, uncontrolled, without  complications  - refuses a pump - son with DM1 (not on a pump)  PLAN:  1. Patient with long-standing, uncontrolled diabetes, on basal-bolus insulin, plus Metformin. His sugars are worse later in the day as he forgets insulin with meals. He also sometimes forget Lantus as it is 2x a day >> will try Toujeo. We had a discussion for a pump but he absolutely refuses it. I encouraged him to write sugars down and make comments if her forgets the insulin or not, but he prefers to bring his meter.  Patient Instructions  Please stop Lantus and start Toujeo 30 units in am. Tonight take 8 units of Lantus and tomorrow am take 22 units of Toujeo.  Please continue  - Metformin 1000 mg 2x a day - Humalog insulin to carb ratio: 1:10 but in a less active day, 1:15 - target sugar 130 - insulin sensitivity factor 25: - 130-155: + 1 unit  - 156-180: + 2 units  - 181-205: + 3 units  - 206-230: + 4 units  - >231: + 5 units Please add 2 more Humalog units with breakfast.  Please return in 3 months with your sugar log.   Please stop at the lab.  - continue checking sugars at different times of the day - check 3-4 times a day, rotating checks - UTD with yearly eye exams  - will check HbA1c today - Return to clinic in 3 mo with sugar log   Office Visit on 05/16/2014  Component Date Value Ref Range Status  . Hgb A1c MFr Bld 05/16/2014 10.4* 4.6 - 6.5 % Final   Glycemic Control Guidelines for People with Diabetes:Non Diabetic:  <6%Goal of Therapy: <7%Additional Action Suggested:  >8%    HbA1c higher. Toujeo apparently not covered by insurance. Pt wrote me a message that he is reconsidering a pump.

## 2014-05-16 NOTE — Patient Instructions (Signed)
Please stop Lantus and start Toujeo 30 units in am. Tonight take 8 units of Lantus and tomorrow am take 22 units of Toujeo.  - Please continue  - Metformin 1000 mg 2x a day - Humalog insulin to carb ratio: 1:10 but in a less active day, 1:15 - target sugar 130 - insulin sensitivity factor 25: - 130-155: + 1 unit  - 156-180: + 2 units  - 181-205: + 3 units  - 206-230: + 4 units  - >231: + 5 units Please add 2 more Humalog units with breakfast.  Please return in 3 months with your sugar log.   Please stop at the lab.

## 2014-05-19 ENCOUNTER — Encounter: Payer: Self-pay | Admitting: Internal Medicine

## 2014-05-19 ENCOUNTER — Telehealth: Payer: Self-pay | Admitting: Internal Medicine

## 2014-05-19 ENCOUNTER — Other Ambulatory Visit: Payer: Self-pay | Admitting: Internal Medicine

## 2014-05-19 DIAGNOSIS — E1165 Type 2 diabetes mellitus with hyperglycemia: Principal | ICD-10-CM

## 2014-05-19 DIAGNOSIS — IMO0001 Reserved for inherently not codable concepts without codable children: Secondary | ICD-10-CM

## 2014-05-23 NOTE — Telephone Encounter (Signed)
error 

## 2014-05-27 NOTE — Progress Notes (Signed)
Patient ID: Nathaniel [name], male   DOB: 05/11/1965, 49 y.o.   MRN: [id number] ATTENDING PHYSICIAN NOTE: I have reviewed the chart and agree with the plan as detailed above. Denny LevySara Ennio Houp MD Pager [phone]

## 2014-06-04 ENCOUNTER — Other Ambulatory Visit: Payer: Self-pay | Admitting: Family

## 2014-06-04 NOTE — Telephone Encounter (Signed)
30 day supply lisinopril sent to pharmacy. Mychart message sent to pt to schedule office visit before further refills are due.

## 2014-06-06 ENCOUNTER — Encounter: Payer: Self-pay | Admitting: Physician Assistant

## 2014-06-06 ENCOUNTER — Ambulatory Visit (INDEPENDENT_AMBULATORY_CARE_PROVIDER_SITE_OTHER): Payer: 59 | Admitting: Physician Assistant

## 2014-06-06 ENCOUNTER — Other Ambulatory Visit: Payer: Self-pay | Admitting: Physician Assistant

## 2014-06-06 VITALS — BP 102/68 | HR 82 | Temp 97.9°F | Wt 188.0 lb

## 2014-06-06 DIAGNOSIS — E1165 Type 2 diabetes mellitus with hyperglycemia: Secondary | ICD-10-CM

## 2014-06-06 DIAGNOSIS — IMO0001 Reserved for inherently not codable concepts without codable children: Secondary | ICD-10-CM

## 2014-06-06 LAB — BASIC METABOLIC PANEL
BUN: 9 mg/dL (ref 6–23)
CO2: 30 mEq/L (ref 19–32)
Calcium: 9.7 mg/dL (ref 8.4–10.5)
Chloride: 96 mEq/L (ref 96–112)
Creatinine, Ser: 0.98 mg/dL (ref 0.40–1.50)
GFR: 86.49 mL/min (ref >60.00)
Glucose, Bld: 305 mg/dL — ABNORMAL HIGH (ref 70–99)
Potassium: 4.3 mEq/L (ref 3.5–5.1)
Sodium: 132 mEq/L — ABNORMAL LOW (ref 135–145)

## 2014-06-06 LAB — MICROALBUMIN / CREATININE URINE RATIO
Creatinine,U: 17.1 mg/dL
Microalb Creat Ratio: 4.1 mg/g (ref 0.0–30.0)
Microalb, Ur: <0.7 mg/dL (ref 0.0–1.9)

## 2014-06-06 LAB — LIPID PANEL
Cholesterol: 164 mg/dL (ref 0–200)
HDL: 55.1 mg/dL (ref >39.00)
LDL Cholesterol: 85 mg/dL (ref 0–99)
NonHDL: 108.9
Total CHOL/HDL Ratio: 3
Triglycerides: 122 mg/dL (ref 0.0–149.0)
VLDL: 24.4 mg/dL (ref 0.0–40.0)

## 2014-06-06 MED ORDER — LISINOPRIL 2.5 MG PO TABS: 2.5000 mg | ORAL_TABLET | Freq: Every day | ORAL | Status: DC

## 2014-06-06 NOTE — Addendum Note (Signed)
Addended by: Marcelline MatesMARTIN, Cedric Mcclaine on: 06/06/2014 10:48 AM   Modules accepted: Orders

## 2014-06-06 NOTE — Assessment & Plan Note (Signed)
Foot exam looks food.  Will check Lipid, BMP and Urine Micro.  Lisinopril refilled.  Follow-up with Endo as scheduled. Return at earliest convenience for a CPE.

## 2014-06-06 NOTE — Progress Notes (Signed)
Pre visit review using our clinic review tool, if applicable. No additional management support is needed unless otherwise documented below in the visit note. 

## 2014-06-06 NOTE — Progress Notes (Signed)
Patient presents to clinic today for medication management. Patient is currently on lisinopril 2.5 mg for his DM II.  Is followed by Endocrinology for diabetes. BP normotensive in clinic today.  No history of high blood pressure.  Patient denies chest pain, palpitations, lightheadedness, dizziness, vision changes or frequent headaches.  Is due for repeat labs.  Past Medical History  Diagnosis Date  . Depression   . Hypogonadism male   . Headache(784.0)   . Diabetes mellitus     type II  . GERD (gastroesophageal reflux disease)     Current Outpatient Prescriptions on File Prior to Visit  Medication Sig Dispense Refill  . Alcohol Swabs (ALCOHOL PREPS) PADS Use alcohol pads to clean site to be used to check blood sugar. 100 each 11  . Blood Glucose Monitoring Suppl (BAYER CONTOUR NEXT USB MONITOR) W/DEVICE KIT Use kit as directed 1 kit 0  . glucagon (GLUCAGON EMERGENCY) 1 MG injection Inject 1 mg into the muscle once as needed. 1 each 3  . glucose blood test strip Use as instructed 100 each 12  . Insulin Glargine (TOUJEO SOLOSTAR) 300 UNIT/ML SOPN Inject 30 Units into the skin daily before breakfast. 3 pen 2  . insulin lispro (HUMALOG KWIKPEN) 100 UNIT/ML KiwkPen Inject 0.05-0.1 mLs (5-10 Units total) into the skin 3 (three) times daily. 15 mL 2  . LANCETS MICRO THIN 33G MISC Use lancet to test blood sugar as directed by your physician. 100 each 11  . metFORMIN (GLUCOPHAGE) 1000 MG tablet TAKE 1 TABLET (1,000 MG TOTAL) BY MOUTH 2 (TWO) TIMES DAILY WITH A MEAL. *PT NEEDS APPT FOR FURTHER REFILLS* 60 tablet 2  . Multiple Vitamins-Minerals (MULTIVITAMIN & MINERAL PO) Take 1 tablet by mouth daily.    Marland Kitchen escitalopram (LEXAPRO) 10 MG tablet TAKE 1 TABLET (10 MG TOTAL) BY MOUTH DAILY. (Patient not taking: Reported on 06/06/2014) 10 tablet 0  . gabapentin (NEURONTIN) 300 MG capsule   2   No current facility-administered medications on file prior to visit.    Allergies  Allergen Reactions  .  Eggs Or Egg-Derived Products     childhood  . Penicillins   . Pioglitazone Other (See Comments)      headache  . Promethazine Hcl Nausea And Vomiting       . Reglan [Metoclopramide] Other (See Comments)    Jittery, increased anxiety    Family History  Problem Relation Age of Onset  . Adopted: Yes  . Cancer Father 42    Prostate cancer  . Healthy Mother   . Diabetes Mellitus I Son     History   Social History  . Marital Status: Married    Spouse Name: [name]  . Number of Children: 2  . Years of Education: N/A   Occupational History  .      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 Smoking status: Never Smoker   . Smokeless tobacco: Never Used  . Alcohol Use: No  . Drug Use: No  . Sexual Activity: Not on file   Other Topics Concern  . None   Social History Narrative   He lives with wife and two children.   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   Highest level of education: master degree   Review of Systems - See HPI.  All other ROS are negative.  BP 102/68 mmHg  Pulse 82  Temp(Src) 97.9 F (36.6 C)  Wt 188 lb (85.276  kg)  SpO2 100%  Physical Exam  Constitutional: He is oriented to person, place, and time and well-developed, well-nourished, and in no distress.  HENT:  Head: Normocephalic and atraumatic.  Eyes: Conjunctivae are normal.  Cardiovascular: Normal rate, regular rhythm, normal heart sounds and intact distal pulses.   Pulmonary/Chest: Effort normal and breath sounds normal. No respiratory distress. He has no wheezes. He has no rales. He exhibits no tenderness.  Neurological: He is alert and oriented to person, place, and time.  Skin: Skin is warm and dry. No rash noted.  Psychiatric: Affect normal.  Vitals reviewed.  Diabetic Foot Form - Detailed   Diabetic Foot Exam - detailed  Diabetic Foot exam was performed with the following findings:  Yes 06/06/2014 10:29 AM  Visual Foot Exam  completed.:  Yes  Is there a history of foot ulcer?:  No  Can the patient see the bottom of their feet?:  Yes  Are the shoes appropriate in style and fit?:  Yes  Is there swelling or and abnormal foot shape?:  No  Are the toenails long?:  Yes  Are the toenails thick?:  No  Do you have pain in calf while walking?:  No  Is there a claw toe deformity?:  No  Is there elevated skin temparature?:  No  Is there limited skin dorsiflexion?:  No  Is there foot or ankle muscle weakness?:  No  Are the toenails ingrown?:  No  Normal Range of Motion:  Yes    Pulse Foot Exam completed.:  Yes  Right posterior Tibialias:  Present Left posterior Tibialias:  Present  Right Dorsalis Pedis:  Present Left Dorsalis Pedis:  Present  Sensory Foot Exam Completed.:  Yes  Swelling:  No  Semmes-Weinstein Monofilament Test  R Foot Test Control:  Neg L Foot Test Control:  Neg  R Site 1-Great Toe:  Neg L Site 1-Great Toe:  Neg  R Site 4:  Neg L Site 4:  Neg  R Site 5:  Neg L Site 5:  Neg        Recent Results (from the past 2160 hour(s))  Hemoglobin A1C     Status: Abnormal   Collection Time: 05/16/14  3:49 PM  Result Value Ref Range   Hgb A1c MFr Bld 10.4 (H) 4.6 - 6.5 %    Comment: Glycemic Control Guidelines for People with Diabetes:Non Diabetic:  <6%Goal of Therapy: <7%Additional Action Suggested:  >8%    Assessment/Plan: Diabetes mellitus type 2, uncontrolled, without complications Foot exam looks food.  Will check Lipid, BMP and Urine Micro.  Lisinopril refilled.  Follow-up with Endo as scheduled. Return at earliest convenience for a CPE.

## 2014-06-06 NOTE — Patient Instructions (Signed)
Please stop by the lab for blood work.  I will call you with your results. Please continue medications as directed. Continue the Diabetic Wellness program.  Follow-up with me at your earliest convenience for a complete physical.

## 2014-06-07 ENCOUNTER — Encounter: Payer: Self-pay | Admitting: Physician Assistant

## 2014-06-10 ENCOUNTER — Encounter: Payer: Self-pay | Admitting: Physician Assistant

## 2014-06-10 ENCOUNTER — Encounter: Payer: Self-pay | Admitting: *Deleted

## 2014-06-13 ENCOUNTER — Encounter: Payer: Self-pay | Admitting: *Deleted

## 2014-06-13 ENCOUNTER — Encounter: Payer: 59 | Attending: Internal Medicine | Admitting: *Deleted

## 2014-06-13 VITALS — Ht 71.0 in | Wt 190.3 lb

## 2014-06-13 DIAGNOSIS — Z794 Long term (current) use of insulin: Secondary | ICD-10-CM | POA: Insufficient documentation

## 2014-06-13 DIAGNOSIS — Z713 Dietary counseling and surveillance: Secondary | ICD-10-CM | POA: Diagnosis not present

## 2014-06-13 DIAGNOSIS — E1165 Type 2 diabetes mellitus with hyperglycemia: Secondary | ICD-10-CM | POA: Insufficient documentation

## 2014-06-13 DIAGNOSIS — IMO0001 Reserved for inherently not codable concepts without codable children: Secondary | ICD-10-CM

## 2014-06-13 NOTE — Progress Notes (Signed)
Introduction to Insulin Pump Therapy:  Date: 06/13/14  Appt start time: 1030 end time:  1200.  Assessment:  This patient has DM 2 and their primary concerns today: he wants to learn more about what pumps are available and that would be covered by his insurance.  This patient is interested in learning more about insulin pump therapy because he wants to improve his control and his son is considering a pump also. History of DM2 Diabetes for 15 years. 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 His 49 yo son has DM 1.  Patient is Dr. Charlean Sanfilippo patient also and typically Cristy Folks, RN, CDE would educate him on insulin pumps and CGM but patient does not work on Fridays so that is only day he can come for appointments. So I will work with him as Bonita Quin is not available on Fridays.   MEDICATIONS: Basal Insulin: 16 units of Lantus at AM and PM daily Bolus Insulin: 20 units of Humalog at various amounts per meal  Total of insulin doses per day 52 units Other diabetes medications: Metformin  Patient states they are currently testing 4 times per day Patient does currently have Ketone Strips  Current meal time insulin:  Insulin Carb Ratio of 1 unit/15 grams Current Correction insulin:     Insulin Sensitivity Factor of 1 unit/50 mg/dl above Target of 161  Patient states knowledge of Carb Counting is pretty good, he struggles with restaurant meals  Usual physical activity: none lately since he broke a toe, used to go to gym 3 times a week, walking and some running  Last A1c was 10.4%  Patient states they forget to take their insulin injection on average 2-3 times per week Patient states they have had hypoglycemia 1-2 times in the past month Patient states their biggest barrier with diabetes is remembering to take the insulin   Patient currently is working full time   Progress Towards Obtaining an Insulin PumpGoal(s):  In progress.  Patient states their expectations of pump therapy  include: more accurate delivery of insulin in more convenient way Patient expresses understanding that for improved outcomes for their diabetes on an insulin pump they will:  Check BG at least 4 times per day  Change out pump infusion set at least every 3 days  Upload pump information to their pump's Web Based Program on a regular basis so provider can assess patterns and make setting adjustments.     Intervention:    Taught difference between delivery of insulin via syringe/pen compared to insulin pump.  Demonstrated improved insulin delivery via pump due to improved accuracy of dose and flexibility of adjusting bolus insulin based on carb intake and BG correction.  Showed patient the following pumps: Animas, Medtronic, OmniPod, Tandem  Demonstrated pump, insulin reservoir and infusion set options, and button pushing for bolus delivery of insulin through the pump  Explained importance of testing BG at least 4 times per day for appropriate correction of high BG and prevention of DKA as applicable.  Emphasized importance of follow up after Pump Start for appropriate pump setting adjustments and on-going training on more advanced features.  Discussed current Continuous Glucose Monitoring options  Handouts given during visit include:  Introduction to Pump Therapy Handout  Carb Counting Handout and Yellow Meal Plan Card  DM 1 Support Group Flyer  Insulin Pump Packet from Tandem and Medtronic due to reservoir sizes and integrated CGM options   Monitoring/Evaluation:    Patient does  want to continue  with pursuit of insulin pump but he has not decided which one yet  Patient instructed to go to that pump web-site to complete any learning module on insulin pump prior to next visit  Once pump is shipped, patient to call this office to set up training.

## 2014-06-20 ENCOUNTER — Telehealth: Payer: Self-pay | Admitting: Internal Medicine

## 2014-06-20 NOTE — Telephone Encounter (Signed)
Please read message below and advise. Pt is ready to get the insulin pump.

## 2014-06-20 NOTE — Telephone Encounter (Signed)
Patient ask if he could get a lab draw for insulin c peptide test done so he can get the ins pump. Please advise

## 2014-06-23 NOTE — Telephone Encounter (Signed)
Absolutely - please come fasting. Let's check a glucose (fasting) and a C-peptide. Please check sugars at home before coming - Glu needs to be <220 at the time of the draw. I would come to the lab only if 150 or below on his meter, just to make sure.

## 2014-06-24 ENCOUNTER — Encounter: Payer: Self-pay | Admitting: Internal Medicine

## 2014-06-24 ENCOUNTER — Telehealth: Payer: Self-pay | Admitting: Internal Medicine

## 2014-06-24 NOTE — Telephone Encounter (Signed)
Patient ask if you would call him °

## 2014-06-24 NOTE — Progress Notes (Signed)
Received records with C-peptide of 3.67 (0.8-3.9), from 06/16/2005 ([facility]). No associated glucose reported.

## 2014-06-24 NOTE — Telephone Encounter (Signed)
Returned pt's call. Pt had a C-peptide test done in 2007. Pt stated that he spoke with a rep at [employer] and they said it could be used. Pt to send over his test results to Dr Elvera LennoxGherghe. Be advised.

## 2014-06-24 NOTE — Telephone Encounter (Signed)
Called pt and lvm advising him per Dr Charlean SanfilippoGherghe's note below. Advised pt to call and schedule the lab appt.

## 2014-06-24 NOTE — Telephone Encounter (Signed)
Great!

## 2014-06-26 ENCOUNTER — Other Ambulatory Visit: Payer: Self-pay | Admitting: *Deleted

## 2014-06-26 MED ORDER — GLUCOSE BLOOD VI STRP
ORAL_STRIP | Status: DC
Start: 2014-06-26 — End: 2015-08-05

## 2014-07-09 ENCOUNTER — Telehealth: Payer: Self-pay | Admitting: Internal Medicine

## 2014-07-09 MED ORDER — INSULIN LISPRO 100 UNIT/ML ~~LOC~~ SOLN: SUBCUTANEOUS | Status: DC

## 2014-07-09 NOTE — Telephone Encounter (Signed)
Please call in insulin for him to use with his pump start he is using humalog now.

## 2014-07-09 NOTE — Telephone Encounter (Signed)
Please read message below and advise if ok to send in Humalog. Thank you.

## 2014-07-09 NOTE — Telephone Encounter (Signed)
Carollee HerterShannon, can you please send the Rx for Humalog vials - see email from Bev:  I am going to be starting your patient: Nathaniel [name], Nathaniel A. DOB 02-May-1965, MRN: [id number] on his Medtronic 530G pump this Friday, 07/11/14. You have signed the orders, thank you.  I want to follow up to make sure his Rx for Humalog in vials has been called into his pharmacy? He will need 2 vials per month at 60 units per day. (It was included on the order sheet but I wanted to confirm with you in the meantime.)   Thank you so much, C

## 2014-07-09 NOTE — Telephone Encounter (Signed)
Done

## 2014-07-11 ENCOUNTER — Encounter: Payer: 59 | Attending: Internal Medicine | Admitting: *Deleted

## 2014-07-11 ENCOUNTER — Ambulatory Visit: Payer: 59 | Admitting: Pharmacist

## 2014-07-11 DIAGNOSIS — Z794 Long term (current) use of insulin: Secondary | ICD-10-CM | POA: Insufficient documentation

## 2014-07-11 DIAGNOSIS — E1165 Type 2 diabetes mellitus with hyperglycemia: Secondary | ICD-10-CM | POA: Insufficient documentation

## 2014-07-11 DIAGNOSIS — Z713 Dietary counseling and surveillance: Secondary | ICD-10-CM | POA: Insufficient documentation

## 2014-07-11 DIAGNOSIS — IMO0001 Reserved for inherently not codable concepts without codable children: Secondary | ICD-10-CM

## 2014-07-11 NOTE — Progress Notes (Signed)
Insulin Pump Start Progress Note on 07/11/14  Patient appointment start time: 1100  End time 1300  Patient here for insulin pump start on Medtronic Revel insulin pump and Quickset infusion set Orders with pump settings received from MD Patient completed Pre- training by  On-line modules, training books and return demonstration  Reviewed Pump Set Up including  Menu Settings  Bolus Settings:    Insulin Carb Ratio of 1 unit / 10 grams     Insulin Sensitivity Factor of 1 unit / 35 mg/dl              Target: 784100 mg/dl  Suspend  Basal with initial Basal Rate of 1.10 units/hour  Reservoir Set Up  Utilities  Pump Training Checklist completed  Used Temp Basal of 9.5 hours duration @ 50 % basal due to patient taking their long acting insulin yesterday at 9 PM  Patient is aware to sign up for Web Based Software and agrees to upload within 3 days for review of progress and allow for pump setting adjustments.  Patient successfully completed pump start and instructed to call me if BG drops below 70 mg/dl or goes above 696300 mg/dl or as directed by MD  Follow up plan: Patient to upload within 3-5 days and notify me so I can see [facility] Reports.

## 2014-07-19 ENCOUNTER — Encounter: Payer: Self-pay | Admitting: Internal Medicine

## 2014-07-22 ENCOUNTER — Encounter: Payer: Self-pay | Admitting: Physician Assistant

## 2014-07-25 ENCOUNTER — Ambulatory Visit: Payer: Self-pay | Admitting: Physician Assistant

## 2014-07-25 ENCOUNTER — Encounter: Payer: 59 | Attending: Internal Medicine | Admitting: *Deleted

## 2014-07-25 ENCOUNTER — Encounter: Payer: Self-pay | Admitting: *Deleted

## 2014-07-25 DIAGNOSIS — Z794 Long term (current) use of insulin: Secondary | ICD-10-CM | POA: Insufficient documentation

## 2014-07-25 DIAGNOSIS — Z713 Dietary counseling and surveillance: Secondary | ICD-10-CM | POA: Diagnosis not present

## 2014-07-25 DIAGNOSIS — E1165 Type 2 diabetes mellitus with hyperglycemia: Secondary | ICD-10-CM | POA: Insufficient documentation

## 2014-07-25 DIAGNOSIS — IMO0001 Reserved for inherently not codable concepts without codable children: Secondary | ICD-10-CM

## 2014-07-25 NOTE — Progress Notes (Signed)
  Pump Follow Up Progress Note 07/25/14  Orders received from MD giving me permission to make insulin pump adjustments for this patient.  Reviewed blood glucose logs on 07/25/14 via: [facility]  and found the following:                Hypoglycemia Hyperglycemia Comments  Overnight Period:  NONE INSIGNIFICANT NO CHANGES  Pre-Meal:    Breakfast      Lunch      Supper     Post-Meal: Breakfast      Lunch      Supper     Bedtime:       Comments: Patient started on the Medtronic insulin pump on 07/11/14. He has uploaded several times as requested and upon review, no pump setting changes were needed. Patient doing very well today. Average BG for past 7 days is 151 +/- 44 mg/dl! He reports only occasional low BG and that is if meal is delayed. He has had a cold this past week so BG are actually higher than the previous week. He expressed concern over higher BG's when eating out so I demonstrated the Dual Wave and suggested a 2 hour duration for higher fat meals. I explained that he should not have to eat to prevent low BG and if he finds that they drop once his cold is gone, to let me know and we can lower his Basal Rate.   Pump Settings: Date: Current Date: 07/25/14  No changes today   Basal Rate: Carb Ratio Sensitivity  Basal Rate: Carb Ratio Sensitivity   MN: 1.10 10 35 MN: 1.10 10 35                                                         Plan: Continue using Bolus Wizard for meals and correction doses with your pump We have discussed carb counting by food group as back up plan to grams and food labels We have turned on the Dual Wave Bolus tool for use with higher fat meals, I suggest using a 2 hour duration for now.   Follow up: Pump adjustments completed, patient to follow up with MD for further management and call me for any questions or concerns as needed

## 2014-07-25 NOTE — Patient Instructions (Signed)
Plan: Continue using Bolus Wizard for meals and correction doses with your pump We have discussed carb counting by food group as back up plan to grams and food labels We have turned on the Dual Wave Bolus tool for use with higher fat meals, I suggest using a 2 hour duration for now.

## 2014-07-30 ENCOUNTER — Encounter: Payer: Self-pay | Admitting: Rehabilitation

## 2014-07-31 ENCOUNTER — Telehealth: Payer: Self-pay | Admitting: Internal Medicine

## 2014-07-31 ENCOUNTER — Telehealth: Payer: Self-pay | Admitting: *Deleted

## 2014-07-31 NOTE — Telephone Encounter (Signed)
Read message below. This was from last night. Be advised.

## 2014-07-31 NOTE — Telephone Encounter (Signed)
Team health note dated 07/30/14 7:41 pm and 10:02 pm Caller states he is having problems with his insulin pump, blood sugar is 246. Checked again while on phone it was 214. Caller states insulin pump isnt working well and has manually gave himself insulin. Blood sugar has been in the 300s twice today. At 10 pm it was 342  On call MD paged-was told to give 5 units of humalog now. Cont pushing water, recheck sugar in 1 hr. If develops symptoms, go ER.

## 2014-07-31 NOTE — Telephone Encounter (Signed)
Please read message below. Called pt and spoke with him. He stated that he had too much anxiety being on the pump. He tried bolusing and that did not help bring his sugars down. Pt took the pump off. He is going back on his previous dosing regimen of insulin and will keep an eye on his sugar levels. If they consistently stay above 200 call tomorrow. If not, call back on Monday to advise us of your sugar levels. Pt has appt in 2 weeks. Be advised.

## 2014-07-31 NOTE — Telephone Encounter (Signed)
Patient called stating that his diabetic pump is not working  His blood sugars are in the 300's and to him that is not acceptable Mr. Nathaniel [name] would like to go back to "shots"  Please advise    Thank you

## 2014-07-31 NOTE — Telephone Encounter (Signed)
On Monday, 5/9, patient texted me that his BG was 208 and then later texted that he would need Basal Adjustments. I texted back that I was off on Mondays and would be able to look at his Reports and respond on Wednesday, 5/11. On Tuesday, he emailed me that he was having hypoglycemia, so I called him back and reduced his Basal Rate from 1.10 u/hr to 0.95 u/hr x 24 hours. On Wednesday, 5/12, he texted me at 7 PM that his BG had been over 230 mg/dl since noon. When I called him back, he said he had contacted Dr. Wendy Poet. Gherghe too.  She returned his call while I was on the phone with him, so we hung up so he could take her call. He did not call me back as I had asked him to, but did text me at 10:38 PM that he was taking insulin by injection and drinking extra water, there were no ketones. He also stated he was going to try and see her today, Thursday 07/31/14

## 2014-08-01 ENCOUNTER — Encounter: Payer: Self-pay | Admitting: Internal Medicine

## 2014-08-01 ENCOUNTER — Telehealth: Payer: Self-pay | Admitting: *Deleted

## 2014-08-01 ENCOUNTER — Ambulatory Visit (INDEPENDENT_AMBULATORY_CARE_PROVIDER_SITE_OTHER): Payer: 59 | Admitting: Internal Medicine

## 2014-08-01 VITALS — BP 114/60 | HR 76 | Temp 98.2°F | Resp 12 | Wt 190.0 lb

## 2014-08-01 DIAGNOSIS — IMO0001 Reserved for inherently not codable concepts without codable children: Secondary | ICD-10-CM

## 2014-08-01 DIAGNOSIS — E1165 Type 2 diabetes mellitus with hyperglycemia: Secondary | ICD-10-CM | POA: Diagnosis not present

## 2014-08-01 MED ORDER — INSULIN GLARGINE 100 UNIT/ML SOLOSTAR PEN: PEN_INJECTOR | SUBCUTANEOUS | Status: DC

## 2014-08-01 MED ORDER — INSULIN LISPRO 100 UNIT/ML (KWIKPEN): 5.0000 [IU] | PEN_INJECTOR | Freq: Three times a day (TID) | SUBCUTANEOUS | Status: DC

## 2014-08-01 NOTE — Telephone Encounter (Signed)
Called pt and lvm advising him that Dr Elvera LennoxGherghe has 2 open times that she could see him today. One at 3:15 and the other is 4:15. Advised him that Dr Elvera LennoxGherghe discussed his case with Pincus Large[facility]everly Paddock [facility]. Advised pt to call back and let me know if he is able to come today.

## 2014-08-01 NOTE — Telephone Encounter (Signed)
Per Dr. Charlean SanfilippoGherghe's request that I follow up with this patient, I called and left a voice mail and in addition texted him offering to see him today if he could be here by 2 PM. Also offered to review his Reports if he has uploaded recently and we could discuss over the phone if he cannot come here today. No response yet.

## 2014-08-01 NOTE — Telephone Encounter (Signed)
D/w Bev. He is now on injections. Will try to have him see her today (I am completely booked) and I can help .

## 2014-08-01 NOTE — Telephone Encounter (Signed)
Pt called back. He will be here at 4:15 to see Dr Elvera LennoxGherghe.

## 2014-08-01 NOTE — Telephone Encounter (Signed)
Bev, I was out of the office yesterday and today I have 20 pts scheduled.  Please have him come back and see you and I can look over the pump downloads or injection doses. I can help over the phone, too, if needed, after 5 pm or in lunch break. Sorry, this is an extremely busy time for me before I go to [place] in June.Marland Kitchen..Marland Kitchen

## 2014-08-01 NOTE — Telephone Encounter (Signed)
Per text message, patient is no longer on the pump, is taking shots again. I offered to discuss with him or to help him with any questions he might have going forward. No appointment made with me at this time.

## 2014-08-01 NOTE — Progress Notes (Signed)
Patient ID: Nathaniel [name], male   DOB: Sep 19, 1965, 49 y.o.   MRN: [id number]  HPI: Nathaniel [name] is a 49 y.o.-year-old man, returning for f/u for DM2, dx 2001, insulin-dependent since 2010, uncontrolled, without complications. Last visit 3 mo ago.  He started on a pump 3 weeks ago >> feels anxious about it >> afraid of malfunction, feels aware of his DM more. He had to stop it and is now on injections in the last 2 days >> sugars are better.  Stopped fast food; realized that his carb counting was suboptimal before; he exercises more consistently; he is happy about the awareness the pump has brought but would not want to continue for now.  Last hemoglobin A1c was: Lab Results  Component Value Date   HGBA1C 10.4* 05/16/2014   HGBA1C 10.1* 07/30/2013   HGBA1C 9.5* 12/19/2012  11/08/2013: 9.5% 04/11/2013: 9.8%  Pt is on a regimen of: - Metformin 1000 mg po bid - Lantus 16 units in am and 16 units at bedtime  - Humalog insulin to carb ratio: 1:10. - target sugar 130 - insulin sensitivity factor 25 - 130-155: + 1 unit  - 156-180: + 2 units  - 181-205: + 3 units  - 206-230: + 4 units  - >231: + 5 units  Pt checks his sugars 3-4 a day and they are (no log, we downloaded his meter) - sugars only after came off pump: - am:71-176 (190, 244) >> 103-242 >> 95-180, occasionally 200s >> 72-259 >> 111 - 2h after b'fast: n/c >> 191-222 >> n/c >> 86-289 >> n/c >> 86-170, 338 >> 216, 133 - before lunch: 130-317 >> 97-290, most <200 >> 197-217 >> 180-250s >> 97-390 >> 91 - 2h after lunch: n/c >> 86-302 >> 122-236 >> 106-503 >> 122-173 >> 83-302 >> 72 - before dinner: 250s (100s-300s) >> 129-292 >> 67-260 >> 75-303 >> 98-242, 335 >> 130-342 >> 145 - 2h after dinner: n/c >> 101-337 >> 66, 102-197 >> 159-342 >> 149 - bedtime: 160-180 >> n/c >> 357 >> see above Lows: 72, after correction of a high sugar..  Pt's meals are: - Breakfast: cereal + 1% milk, with protein bar - Lunch: sandwich -  Dinner: eating out or eating in - Snacks: 1-2 a day He has a low appetite. Walks 3-4x a week. [employer] [place] a lot for his job.  - no CKD, last BUN/creatinine:  Lab Results  Component Value Date   BUN 9 06/06/2014   CREATININE 0.98 06/06/2014  He is on Lisinopril. - last set of lipids: Lab Results  Component Value Date   CHOL 164 06/06/2014   HDL 55.10 06/06/2014   LDLCALC 85 06/06/2014   TRIG 122.0 06/06/2014   CHOLHDL 3 06/06/2014  - last eye exam was 10/2013. No DR.  - no numbness and tingling in his feet. He has a place on the lateral R foot >> pain.   I reviewed pt's medications, allergies, PMH, social hx, family hx, and changes were documented in the history of present illness. Otherwise, unchanged from my initial visit note. Stopped Lexapro.  ROS: Constitutional: no weight gain/loss, + fatigue, no subjective hyperthermia/hypothermia Eyes: no blurry vision, no xerophthalmia ENT: no sore throat, no nodules palpated in throat, no dysphagia/odynophagia, no hoarseness Cardiovascular: no CP/SOB/palpitations/leg swelling Respiratory: no cough/no SOB Gastrointestinal: no N/V/D/C Musculoskeletal: no muscle/joint aches Skin: no rashes Neurological: no tremors/numbness/tingling/dizziness  PE: BP 114/60 mmHg  Pulse 76  Temp(Src) 98.2 F (36.8 C) (Oral)  Resp 12  Wt 190 lb (86.183 kg)  SpO2 97% Body mass index is 26.51 kg/(m^2).  Wt Readings from Last 3 Encounters:  08/01/14 190 lb (86.183 kg)  06/13/14 190 lb 4.8 oz (86.32 kg)  06/06/14 188 lb (85.276 kg)   Constitutional: normal weight, in NAD, as usual, appears very depressed Eyes: PERRLA, EOMI, no exophthalmos ENT: moist mucous membranes, no thyromegaly, no cervical lymphadenopathy Cardiovascular: RRR, No MRG Respiratory: CTA B Gastrointestinal: abdomen soft, NT, ND, BS+ Musculoskeletal: no deformities, strength intact in all 4 Skin: moist, warm, no rashes Neurological: no tremor with outstretched hands, DTR normal in  all 4  ASSESSMENT: 1. DM2, insulin-dependent, uncontrolled, without complications - Received records with C-peptide of 3.67 (0.8-3.9), from 06/16/2005 ([facility]). No associated glucose reported. - son with DM1 (not on a pump)  PLAN:  1. Patient with long-standing, uncontrolled diabetes. He tried an insulin pump, but did not like it >> now back on injections with Lantus and Humalog, doing better. We do not have many sugars but they are better. Will increase his ISF a little, OTW keep on the previous insulin doses: Patient Instructions  Please continue: - Metformin 1000 mg 2x a day - Lantus 16 units in am and 16 units at bedtime  - Humalog insulin to carb ratio: 1:10. - target sugar 130  Please change: - insulin sensitivity factor 30 - 130-160: + 1 unit  - 161-190: + 2 units  - 191-220: + 3 units  - 221-250: + 4 units  - >251: + 5 units  Please return in 2 months with your sugar log.   - continue checking sugars at different times of the day - check 3-4 times a day, rotating checks - UTD with yearly eye exams  - will check HbA1c at next visit - Return to clinic in 2 mo with sugar log

## 2014-08-01 NOTE — Patient Instructions (Signed)
Please continue: - Metformin 1000 mg 2x a day - Lantus 16 units in am and 16 units at bedtime  - Humalog insulin to carb ratio: 1:10. - target sugar 130  Please change: - insulin sensitivity factor 30 - 130-160: + 1 unit  - 161-190: + 2 units  - 191-220: + 3 units  - 221-250: + 4 units  - >251: + 5 units  Please return in 2 months with your sugar log.

## 2014-08-03 ENCOUNTER — Encounter: Payer: Self-pay | Admitting: Internal Medicine

## 2014-08-15 ENCOUNTER — Telehealth: Payer: Self-pay | Admitting: Nurse Practitioner

## 2014-08-15 ENCOUNTER — Encounter: Payer: Self-pay | Admitting: Pharmacist

## 2014-08-15 ENCOUNTER — Ambulatory Visit: Payer: 59 | Admitting: Internal Medicine

## 2014-08-15 DIAGNOSIS — N3000 Acute cystitis without hematuria: Secondary | ICD-10-CM

## 2014-08-15 MED ORDER — NITROFURANTOIN MONOHYD MACRO 100 MG PO CAPS: 100.0000 mg | ORAL_CAPSULE | Freq: Two times a day (BID) | ORAL | Status: DC

## 2014-08-15 NOTE — Addendum Note (Signed)
Addended by: Bennie PieriniMARTIN, MARY-MARGARET on: 08/15/2014 08:01 PM   Modules accepted: Orders

## 2014-08-15 NOTE — Progress Notes (Signed)
We are sorry that you are not feeling well.  Here is how we plan to help!  Based on what you shared with me it looks like you most likely have a simple urinary tract infection.  A UTI (Urinary Tract Infection) is a bacterial infection of the bladder.  Most cases of urinary tract infections are simple to treat but a key part of your care is to encourage you to drink plenty of fluids and watch your symptoms carefully.  I have prescribed MacroBid 100 mg twice a day for 5 days.  Your symptoms should gradually improve. Call us if the burning in your urine worsens, you develop worsening fever, back pain or pelvic pain or if your symptoms do not resolve after completing the antibiotic.  Urinary tract infections can be prevented by drinking plenty of water to keep your body hydrated.  Also be sure when you wipe, wipe from front to back and don't hold it in!  If possible, empty your bladder every 4 hours.  Your e-visit answers were reviewed by a board certified advanced clinical practitioner to complete your personal care plan.  Depending on the condition, your plan could have included both over the counter or prescription medications.  If there is a problem please reply  once you have received a response from your provider.  Your safety is important to us.  If you have drug allergies check your prescription carefully.    You can use MyChart to ask questions about today's visit, request a non-urgent call back, or ask for a work or school excuse.  You will get an e-mail in the next two days asking about your experience.  I hope that your e-visit has been valuable and will speed your recovery. Thank you for using e-visits.    

## 2014-08-22 ENCOUNTER — Ambulatory Visit: Payer: 59 | Admitting: Pharmacist

## 2014-08-22 ENCOUNTER — Ambulatory Visit (INDEPENDENT_AMBULATORY_CARE_PROVIDER_SITE_OTHER): Payer: Self-pay | Admitting: Family Medicine

## 2014-08-22 VITALS — BP 112/72 | Ht 71.5 in | Wt 188.6 lb

## 2014-08-22 DIAGNOSIS — E1165 Type 2 diabetes mellitus with hyperglycemia: Secondary | ICD-10-CM

## 2014-08-22 NOTE — Progress Notes (Signed)
Subjective:  Patient presents today for 3 month diabetes follow-up as part of the employer-sponsored Link to Wellness program.  Current diabetes regimen includes Lantus 12 units SQ BID, metformin 1000 mg BID, Humalog 2-7 units SQ with meals. Patient also continues on daily ASA, ACE Inhibitor and statin.  Most recent MD follow-up was May 13th with Dr. Elvera LennoxGherghe. Last A1C was 10.4% in February 2016. Patient has a pending appt with Dr. Elvera LennoxGherghe for July 15th. Since his last appointment with me he has started a pump and stopped using a pump. He is back on Lantus and Humalog multiple daily injections.   Assessment/Plan:  Patient is a 49 y.o. male with DM 2. Most recent A1C was   10.4% which is exceeding goal of less than 7%. This A1C was taken several months ago. Based on his current CBGs I expect that his A1C is less than 8 now and may be less than 7. Weight is decreased by 2 pounds from last visit with me.  Overall patient appears to have improved glycemic control since coming off the pump. He states he does not want to go back to the pump. He has changed both eating habits (eating fewer carbohydrates and fast food) and physical activity.   CBG Review: He is going low sometimes in the middle of the night. He is correcting with 15 grams of carbohydrates (2 pieces of candy with 6 g carbohydrates each). He is keeping candy with him in his meter.   Carbohydrate ratio is now 1 unit insulin for 10 g carbohydrates; ISF 1 unit for every 30 mg/dL above 409130 mg/dL.   In the last 4 weeks Fasting blood sugar is averaging 120 (82-180), Prelunch- 58-220; overnight 37-55; after lunch 84-160.   He is keeping a detailed written log of his CBGs and physical activity.   Lifestyle improvements:  Physical Activity-  He has increased physical activity since his last visit with me. He is now going to the gym and running/walking at home 5-6 days a week. In the last few weeks he has increased running time and is now running  more minutes than he is walking.    Nutrition-  He is tracking all of his food into My Fitness Pal. He is trying to stay under 50 grams of carbohdyrate with each meal.   He has stopped drinking Diet Pepsi (was drinking a 12 pack daily!)   Follow up with me in 3 months.    Goals for Next Visit: (of note- patient had these goals written out prior to visit and presented them to me at the beginning of the visit)  1. Chart food in food diary on [email or web address] 2. Exercise daily with a goal of 160-200 minutes of cardio activity weekly 3. Eat a low carb/high protein diet 4. Drink water instead of Diet Pepsi. 5. Count all carbohydrates that you are eating. 6. Have back up meter and insulin at work in case I forget; also keep a few metformin at work 7. Stop eating fast food.     Next appointment to see me is: September 2nd, 2016 at 9 AM.    Aundra MilletMegan D. Vivia EwingWheatley, PharmD, BCPS, CDE [facility] Clinical Pharmacist & Diabetes Care Coordinator [phone]

## 2014-09-09 NOTE — Progress Notes (Signed)
ATTENDING PHYSICIAN NOTE:Link to wellness Program I have reviewed the chart and agree with the plan as detailed above. Sara Neal MD Pager [phone]  

## 2014-09-12 ENCOUNTER — Encounter: Payer: Self-pay | Admitting: Physician Assistant

## 2014-09-12 ENCOUNTER — Ambulatory Visit (INDEPENDENT_AMBULATORY_CARE_PROVIDER_SITE_OTHER): Payer: 59 | Admitting: Physician Assistant

## 2014-09-12 VITALS — BP 114/70 | HR 75 | Temp 98.0°F | Ht 71.5 in | Wt 187.4 lb

## 2014-09-12 DIAGNOSIS — R3989 Other symptoms and signs involving the genitourinary system: Secondary | ICD-10-CM | POA: Diagnosis not present

## 2014-09-12 DIAGNOSIS — N400 Enlarged prostate without lower urinary tract symptoms: Secondary | ICD-10-CM | POA: Diagnosis not present

## 2014-09-12 LAB — POCT URINALYSIS DIPSTICK
Blood, UA: NEGATIVE
Glucose, UA: NEGATIVE
Leukocytes, UA: NEGATIVE
Nitrite, UA: NEGATIVE
Spec Grav, UA: 1.02

## 2014-09-12 LAB — PSA: PSA: 0.34 ng/mL (ref 0.10–4.00)

## 2014-09-12 MED ORDER — TAMSULOSIN HCL 0.4 MG PO CAPS: 0.4000 mg | ORAL_CAPSULE | Freq: Every day | ORAL | Status: DC

## 2014-09-12 NOTE — Progress Notes (Signed)
Pre visit review using our clinic review tool, if applicable. No additional management support is needed unless otherwise documented below in the visit note. 

## 2014-09-12 NOTE — Assessment & Plan Note (Signed)
Enlargement noted on DRE without mass or tenderness.  Symptoms fit BPH. Will check PSA. Will send urine to culture although dip unremarkable. Rx Flomax 0.4 mg QPM.  Follow-up 1 month.

## 2014-09-12 NOTE — Patient Instructions (Addendum)
Please go to the lab for blood work. I will call you with your results. Your symptoms and exam seem consistent with enlarged prostate as cause of symptoms. Take the Flomax nightly as directed.  Benign Prostatic Hyperplasia An enlarged prostate (benign prostatic hyperplasia) is common in older men. You may experience the following:  Weak urine stream.  Dribbling.  Feeling like the bladder has not emptied completely.  Difficulty starting urination.  Getting up frequently at night to urinate.  Urinating more frequently during the day. HOME CARE INSTRUCTIONS  Monitor your prostatic hyperplasia for any changes. The following actions may help to alleviate any discomfort you are experiencing:  Give yourself time when you urinate.  Stay away from alcohol.  Avoid beverages containing caffeine, such as coffee, tea, and colas, because they can make the problem worse.  Avoid decongestants, antihistamines, and some prescription medicines that can make the problem worse.  Follow up with your health care provider for further treatment as recommended. SEEK MEDICAL CARE IF:  You are experiencing progressive difficulty voiding.  Your urine stream is progressively getting narrower.  You are awaking from sleep with the urge to void more frequently.  You are constantly feeling the need to void.  You experience loss of urine, especially in small amounts. SEEK IMMEDIATE MEDICAL CARE IF:   You develop increased pain with urination or are unable to urinate.  You develop severe abdominal pain, vomiting, a high fever, or fainting.  You develop back pain or blood in your urine. MAKE SURE YOU:   Understand these instructions.  Will watch your condition.  Will get help right away if you are not doing well or get worse. Document Released: 03/07/2005 Document Revised: 11/07/2012 Document Reviewed: 08/07/2012 [facility] Patient Information 2015 [place], [place]. This information is not intended  to replace advice given to you by your health care provider. Make sure you discuss any questions you have with your health care provider.

## 2014-09-12 NOTE — Progress Notes (Signed)
Patient presents to clinic today c/o urethral pain that is constant with some urinary hesitancy and frequency.  Endorses symptoms began 1 month ago.  Was seen at [facility] and given antibiotic without change in symptoms. Endorses nocturia x 1-2. Denies hematuria. Denies pain or pressure in lower abdomen.  Endorses good bowel output without melena, hematochezia or melena.  Past Medical History  Diagnosis Date  . Depression   . Hypogonadism male   . Headache(784.0)   . Diabetes mellitus     type II  . GERD (gastroesophageal reflux disease)     Current Outpatient Prescriptions on File Prior to Visit  Medication Sig Dispense Refill  . Alcohol Swabs (ALCOHOL PREPS) PADS Use alcohol pads to clean site to be used to check blood sugar. 100 each 11  . Blood Glucose Monitoring Suppl (BAYER CONTOUR NEXT USB MONITOR) W/DEVICE KIT Use kit as directed 1 kit 0  . glucagon (GLUCAGON EMERGENCY) 1 MG injection Inject 1 mg into the muscle once as needed. 1 each 3  . glucose blood (BAYER CONTOUR NEXT TEST) test strip Use to test blood sugar 4 times daily as instructed. Dx: E11.65 125 each 11  . Insulin Glargine (LANTUS SOLOSTAR) 100 UNIT/ML Solostar Pen Inject under skin 16 units 2x a day (Patient taking differently: Inject 12 Units into the skin 2 (two) times daily. Inject under skin 16 units 2x a day) 15 pen 2  . insulin lispro (HUMALOG KWIKPEN) 100 UNIT/ML KiwkPen Inject 0.05-0.1 mLs (5-10 Units total) into the skin 3 (three) times daily. 15 mL 2  . LANCETS MICRO THIN 33G MISC Use lancet to test blood sugar as directed by your physician. 100 each 11  . lisinopril (PRINIVIL,ZESTRIL) 2.5 MG tablet Take 1 tablet (2.5 mg total) by mouth daily. 90 tablet 3  . metFORMIN (GLUCOPHAGE) 1000 MG tablet TAKE 1 TABLET (1,000 MG TOTAL) BY MOUTH 2 (TWO) TIMES DAILY WITH A MEAL. *PT NEEDS APPT FOR FURTHER REFILLS* 60 tablet 2  . Multiple Vitamins-Minerals (MULTIVITAMIN & MINERAL PO) Take 1 tablet by mouth daily.     No current  facility-administered medications on file prior to visit.    Allergies  Allergen Reactions  . Eggs Or Egg-Derived Products     childhood  . Penicillins   . Pioglitazone Other (See Comments)      headache  . Promethazine Hcl Nausea And Vomiting       . Reglan [Metoclopramide] Other (See Comments)    Jittery, increased anxiety    Family History  Problem Relation Age of Onset  . Adopted: Yes  . Cancer Father 58    Prostate cancer  . Healthy Mother   . Diabetes Mellitus I Son     History   Social History  . Marital Status: Married    Spouse Name: [name]  . Number of Children: 2  . Years of Education: N/A   Occupational History  .      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 Smoking status: Never Smoker   . Smokeless tobacco: Never Used  . Alcohol Use: No  . Drug Use: No  . Sexual Activity: Not on file   Other Topics Concern  . None   Social History Narrative   He lives with wife and two children.   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   Highest level of education: master degree   Review of Systems - See HPI.  All other ROS are  negative.  BP 114/70 mmHg  Pulse 75  Temp(Src) 98 F (36.7 C) (Oral)  Ht 5' 11.5" (1.816 m)  Wt 187 lb 6.4 oz (85.004 kg)  BMI 25.78 kg/m2  SpO2 100%  Physical Exam  Constitutional: He is oriented to person, place, and time and well-developed, well-nourished, and in no distress.  HENT:  Head: Normocephalic and atraumatic.  Cardiovascular: Normal rate, regular rhythm, normal heart sounds and intact distal pulses.   Pulmonary/Chest: Effort normal and breath sounds normal. No respiratory distress. He has no wheezes. He has no rales. He exhibits no tenderness.  Genitourinary: Rectum normal and testes/scrotum normal. Prostate is enlarged. Prostate is not tender.  Neurological: He is alert and oriented to person, place, and time.  Skin: Skin is warm and dry.  Vitals  reviewed.  Assessment/Plan: BPH (benign prostatic hyperplasia) Enlargement noted on DRE without mass or tenderness.  Symptoms fit BPH. Will check PSA. Will send urine to culture although dip unremarkable. Rx Flomax 0.4 mg QPM.  Follow-up 1 month.

## 2014-09-14 LAB — CULTURE, URINE COMPREHENSIVE
Colony Count: NO GROWTH
Organism ID, Bacteria: NO GROWTH

## 2014-09-18 ENCOUNTER — Other Ambulatory Visit: Payer: Self-pay | Admitting: Internal Medicine

## 2014-09-20 ENCOUNTER — Encounter: Payer: Self-pay | Admitting: Internal Medicine

## 2014-09-23 ENCOUNTER — Other Ambulatory Visit: Payer: Self-pay | Admitting: Internal Medicine

## 2014-09-23 DIAGNOSIS — IMO0001 Reserved for inherently not codable concepts without codable children: Secondary | ICD-10-CM

## 2014-09-23 DIAGNOSIS — E1165 Type 2 diabetes mellitus with hyperglycemia: Principal | ICD-10-CM

## 2014-09-26 ENCOUNTER — Telehealth: Payer: Self-pay | Admitting: Internal Medicine

## 2014-09-26 ENCOUNTER — Other Ambulatory Visit (INDEPENDENT_AMBULATORY_CARE_PROVIDER_SITE_OTHER): Payer: 59

## 2014-09-26 DIAGNOSIS — E1165 Type 2 diabetes mellitus with hyperglycemia: Secondary | ICD-10-CM | POA: Diagnosis not present

## 2014-09-26 DIAGNOSIS — IMO0001 Reserved for inherently not codable concepts without codable children: Secondary | ICD-10-CM

## 2014-09-26 LAB — HEMOGLOBIN A1C: Hgb A1c MFr Bld: 7.7 % — ABNORMAL HIGH (ref 4.6–6.5)

## 2014-09-26 NOTE — Telephone Encounter (Signed)
Pt called 2 times requesting lab results. Please advise. Thank you.

## 2014-09-26 NOTE — Telephone Encounter (Signed)
Please call pt with lab results call @ [phone]

## 2014-09-26 NOTE — Telephone Encounter (Signed)
Pt calling again for lab results

## 2014-10-03 ENCOUNTER — Encounter: Payer: Self-pay | Admitting: Internal Medicine

## 2014-10-03 ENCOUNTER — Ambulatory Visit (INDEPENDENT_AMBULATORY_CARE_PROVIDER_SITE_OTHER): Payer: 59 | Admitting: Internal Medicine

## 2014-10-03 VITALS — BP 92/52 | HR 94 | Temp 98.1°F | Resp 12 | Wt 185.0 lb

## 2014-10-03 DIAGNOSIS — E1165 Type 2 diabetes mellitus with hyperglycemia: Secondary | ICD-10-CM

## 2014-10-03 DIAGNOSIS — IMO0001 Reserved for inherently not codable concepts without codable children: Secondary | ICD-10-CM

## 2014-10-03 NOTE — Patient Instructions (Signed)
Please continue: - Metformin 1000 mg 2x a day  Please decrease: - Lantus 14 units in am and 10 >> 6 units in hs   Please continue: - Humalog insulin to carb ratio: 1:10 - target sugar 130 >> 120 - insulin sensitivity factor 30 - 130-160: + 1 unit  - 161-190: + 2 units  - 191-220: + 3 units  - 221-250: + 4 units  - >251: + 5 units  Please return in 3 months with your sugar log.

## 2014-10-03 NOTE — Progress Notes (Signed)
Patient ID: Nathaniel [name], male   DOB: 12/28/1965, 49 y.o.   MRN: [id number]  HPI: Nathaniel [name] is a 49 y.o.-year-old man, returning for f/u for DM2, dx 2001, insulin-dependent since 2010, uncontrolled, without complications. Last visit 2 mo ago.  He was briefly on an insulin pump >> felt anxious about it >> afraid of malfunction. He had to stop it and is now on injections >> sugars/Hba1c are better.  Stopped fast food; realized that his carb counting was suboptimal before; he exercises more consistently.  Last hemoglobin A1c was: Lab Results  Component Value Date   HGBA1C 7.7* 09/26/2014   HGBA1C 10.4* 05/16/2014   HGBA1C 10.1* 07/30/2013  11/08/2013: 9.5% 04/11/2013: 9.8%  Pt is on a regimen of: - Metformin 1000 mg 2x a day - Lantus 14 units in am and 10 units in hs  - Humalog insulin to carb ratio: 1:10. Eats <30 g carbs per meal. Takes 2-6 units per meal, usually 2-3 units. - target sugar 130 - insulin sensitivity factor 30 - 130-160: + 1 unit  - 161-190: + 2 units  - 191-220: + 3 units  - 221-250: + 4 units  - >251: + 5 units  Pt checks his sugars 3-4 a day and they are (no log, we downloaded his meter): - am:71-176 (190, 244) >> 103-242 >> 95-180, occasionally 200s >> 72-259 >> 111 >> 95-224 (usually high am CBGs if lows at night - 50s) - 2h after b'fast: n/c >> 191-222 >> n/c >> 86-289 >> n/c >> 86-170, 338 >> 216, 133 >> 164-214, 285 (after potluck) - before lunch: 130-317 >> 97-290, most <200 >> 197-217 >> 180-250s >> 97-390 >> 91 >> 119-202 - 2h after lunch: n/c >> 86-302 >> 122-236 >> 106-503 >> 122-173 >> 83-302 >> 72 >> 119-256 - before dinner: 129-292 >> 67-260 >> 75-303 >> 98-242, 335 >> 130-342 >> 145 >> 143-189 - 2h after dinner: n/c >> 101-337 >> 66, 102-197 >> 159-342 >> 149 >> 141-203 - bedtime: 160-180 >> n/c >> 357 >> see above Lows: 72 >> 51, 53, 66, at midnight >> he did decrease the Lantus at night, usually after exercise at night  Pt's meals  are: - Breakfast: cereal + 1% milk, with protein bar - Lunch: sandwich - Dinner: eating out or eating in - Snacks: 1-2 a day He has a low appetite. Walks 3-4x a week. He cut out caffeine. Ple[employer] [place] a lot for his job.  - no CKD, last BUN/creatinine:  Lab Results  Component Value Date   BUN 9 06/06/2014   CREATININE 0.98 06/06/2014  He is on Lisinopril. - last set of lipids: Lab Results  Component Value Date   CHOL 164 06/06/2014   HDL 55.10 06/06/2014   LDLCALC 85 06/06/2014   TRIG 122.0 06/06/2014   CHOLHDL 3 06/06/2014  - last eye exam was 10/2013. No DR.  - no numbness and tingling in his feet.   I reviewed pt's medications, allergies, PMH, social hx, family hx, and changes were documented in the history of present illness. Otherwise, unchanged from my initial visit note. Started Flomax.  ROS: Constitutional: no weight gain/loss, no fatigue, no subjective hyperthermia/hypothermia Eyes: no blurry vision, no xerophthalmia ENT: no sore throat, no nodules palpated in throat, no dysphagia/odynophagia, no hoarseness Cardiovascular: no CP/SOB/palpitations/leg swelling Respiratory: no cough/no SOB Gastrointestinal: no N/V/D/C Musculoskeletal: + muscle aches R arm/joint aches Skin: no rashes Neurological: no tremors/numbness/tingling/dizziness  PE: BP 92/52 mmHg  Pulse 94  Temp(Src)  98.1 F (36.7 C) (Oral)  Resp 12  Wt 185 lb (83.915 kg)  SpO2 97% Body mass index is 25.45 kg/(m^2).  Wt Readings from Last 3 Encounters:  10/03/14 185 lb (83.915 kg)  09/12/14 187 lb 6.4 oz (85.004 kg)  08/22/14 188 lb 9.6 oz (85.548 kg)   Constitutional: normal weight, in NAD, as usual, appears very depressed Eyes: PERRLA, EOMI, no exophthalmos ENT: moist mucous membranes, no thyromegaly, no cervical lymphadenopathy Cardiovascular: RRR, No MRG Respiratory: CTA B Gastrointestinal: abdomen soft, NT, ND, BS+ Musculoskeletal: no deformities, strength intact in all 4 Skin: moist, warm, no  rashes Neurological: no tremor with outstretched hands, DTR normal in all 4  ASSESSMENT: 1. DM2, insulin-dependent, uncontrolled, without complications - Received records with C-peptide of 3.67 (0.8-3.9), from 06/16/2005 ([facility]). No associated glucose reported. - son with DM1 (not on a pump)  PLAN:  1. Patient with long-standing, uncontrolled diabetes. He tried an insulin pump, but did not like it >> now back on injections with Lantus and Humalog, doing better. Has some lows at night >> decreased the Lantus at night >> will decrease further)(see below). Also, he has some high CBG after meals >> will decrease the target.  - I advised him to: Patient Instructions  Please continue: - Metformin 1000 mg 2x a day  Please decrease: - Lantus 14 units in am and 10 >> 6 units in hs   Please continue: - Humalog insulin to carb ratio: 1:10 - target sugar 130 >> 120 - insulin sensitivity factor 30 - 130-160: + 1 unit  - 161-190: + 2 units  - 191-220: + 3 units  - 221-250: + 4 units  - >251: + 5 units  Please return in 3 months with your sugar log.    - continue checking sugars at different times of the day - check 3-4 times a day, rotating checks - UTD with yearly eye exams  - will check HbA1c at next visit; reviewed last value >> great improvement! - Return to clinic in 3 mo with sugar log

## 2014-10-17 ENCOUNTER — Ambulatory Visit: Payer: 59 | Admitting: Physician Assistant

## 2014-10-17 ENCOUNTER — Ambulatory Visit: Payer: Self-pay | Admitting: Physician Assistant

## 2014-10-24 ENCOUNTER — Encounter: Payer: Self-pay | Admitting: Family Medicine

## 2014-10-24 ENCOUNTER — Ambulatory Visit (INDEPENDENT_AMBULATORY_CARE_PROVIDER_SITE_OTHER): Payer: 59 | Admitting: Family Medicine

## 2014-10-24 VITALS — BP 112/72 | HR 96 | Wt 188.0 lb

## 2014-10-24 DIAGNOSIS — R6889 Other general symptoms and signs: Secondary | ICD-10-CM

## 2014-10-24 DIAGNOSIS — N400 Enlarged prostate without lower urinary tract symptoms: Secondary | ICD-10-CM | POA: Diagnosis not present

## 2014-10-24 MED ORDER — FINASTERIDE 5 MG PO TABS: 5.0000 mg | ORAL_TABLET | Freq: Every day | ORAL | Status: DC

## 2014-10-24 NOTE — Progress Notes (Signed)
CC: Nathaniel [name] is a 49 y.o. male is here for Establish Care   Subjective: HPI:   pleasant 49 year old here to establish care, husband a Nathaniel [name]   Reports a history of BPH, and  PSA was last checked last month, unchanged over the last 5 years and in the normal range. He wakes at least once a night to urinate. He reports urinary frequency during the day and a sensation of incomplete voiding.  Denies having straining to urinate. Denies dysuria or any penile discharge. He started on  Flomax about a month ago and has had almost no improvement with his urinary symptoms. He's also having hypotensive symptoms.  He stopped taking lisinopril a few weeks ago and noticed a big improvement with hypotensive symptoms.   Complains of cold intolerance and constantly feeling like he is cold.  No workup as of yet this  Has  been present for matter of years.  No unintentional weight loss or gain  Review of Systems - General ROS: negative for - chills, fever, night sweats, weight gain or weight loss Ophthalmic ROS: negative for - decreased vision Psychological ROS: negative for - anxiety or depression ENT ROS: negative for - hearing change, nasal congestion, tinnitus or allergies Hematological and Lymphatic ROS: negative for - bleeding problems, bruising or swollen lymph nodes Breast ROS: negative Respiratory ROS: no cough, shortness of breath, or wheezing Cardiovascular ROS: no chest pain or dyspnea on exertion Gastrointestinal ROS: no abdominal pain, change in bowel habits, or black or bloody stools Genito-Urinary ROS: negative for - genital discharge, genital ulcers, incontinence or abnormal bleeding from genitals Musculoskeletal ROS: negative for - joint pain or muscle pain Neurological ROS: negative for - headaches or memory loss Dermatological ROS: negative for lumps, mole changes, rash and skin lesion changes  Past Medical History  Diagnosis Date  . Depression   . Hypogonadism male   .  Headache(784.0)   . Diabetes mellitus     type II  . GERD (gastroesophageal reflux disease)     Past Surgical History  Procedure Laterality Date  . Tonsillectomy  1972  . Nasal sinus surgery  04/1991   Family History  Problem Relation Age of Onset  . Adopted: Yes  . Cancer Father 73    Prostate cancer  . Healthy Mother   . Diabetes Mellitus I Son     History   Social History  . Marital Status: Married    Spouse Name: Nathaniel [name]  . Number of Children: 2  . Years of Education: N/A   Occupational History  .      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 Smoking status: Never Smoker   . Smokeless tobacco: Never Used  . Alcohol Use: No  . Drug Use: No  . Sexual Activity: Not on file   Other Topics Concern  . Not on file   Social History Narrative   He lives with wife and two children.   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   Highest level of education: master degree     Objective: BP 112/72 mmHg  Pulse 96  Wt 188 lb (85.276 kg)  General: Alert and Oriented, No Acute Distress HEENT: Pupils equal, round, reactive to light. Conjunctivae clear.  Moist mucous membranes Lungs: Clear to auscultation bilaterally, no wheezing/ronchi/rales.  Comfortable work of breathing. Good air movement. Cardiac: Regular rate and rhythm. Normal S1/S2.  No murmurs, rubs, nor gallops.   Extremities: No peripheral  edema.  Strong peripheral pulses.  Mental Status: No depression, anxiety, nor agitation. Skin: Warm and dry.  Assessment & Plan: [name] was seen today for establish care.  Diagnoses and all orders for this visit:  BPH (benign prostatic hyperplasia) Orders: -     finasteride (PROSCAR) 5 MG tablet; Take 1 tablet (5 mg total) by mouth daily.  Cold intolerance Orders: -     TSH   BPH: Uncontrolled stop Flomax restart lisinopril and start finasteride Cold InTolerance: Rule out hypothyroidism  Return if symptoms  worsen or fail to improve.

## 2014-10-25 LAB — TSH: TSH: 1.349 u[IU]/mL (ref 0.350–4.500)

## 2014-10-29 ENCOUNTER — Encounter: Payer: Self-pay | Admitting: Family Medicine

## 2014-10-30 MED ORDER — GABAPENTIN 300 MG PO CAPS: 300.0000 mg | ORAL_CAPSULE | Freq: Every evening | ORAL | Status: DC | PRN

## 2014-10-31 ENCOUNTER — Telehealth: Payer: Self-pay | Admitting: Family Medicine

## 2014-10-31 NOTE — Telephone Encounter (Signed)
Amber,  Will you please let patient know that I printed off some home exercises and stretches in hopes that this with his pain.  It says "neck strain" and although I don't think he has a neck strain, these are the same stretches to be used for a pinched nerve in the neck. (I'm worried he'll complain if he thinks I think he has a neck strain)  ----- Message -----      From: Nathaniel [name]     Sent: 10/31/2014 11:28 AM      To: [facility] Clinical Pool    Subject: RE: Non-Urgent Medical Question               Rather than medication I was wondering if there was an exercises or applying heat or cold that you would recommend for the tingling. I have tried sports medicine doctors before and went to Dr.Hudnall the last time I had this problem and he didn't help at all. He tried several different pain meds which didn't help and just made me sleepy. I was just mainly wondering if there was anything like stretching or other exercises I could do that would help. It was Dr.Patel who is a neurologist who tried the gabapentin last time.    I have had shots twice before in my neck. the first time it helped, but the second time about a year ago it really didn't help at all. I am not going to pick up the gabapentin since it did not help in the past.        Nathaniel [name]    ----- Message -----    From: Laren Boom, DO    Sent: 10/31/2014 8:18 AM EDT    To: Nathaniel [name]    Subject: RE: Non-Urgent Medical Question

## 2014-11-02 ENCOUNTER — Encounter: Payer: Self-pay | Admitting: Family Medicine

## 2014-11-03 ENCOUNTER — Emergency Department (INDEPENDENT_AMBULATORY_CARE_PROVIDER_SITE_OTHER)
Admission: EM | Admit: 2014-11-03 | Discharge: 2014-11-03 | Disposition: A | Discharge status: Home / Self Care | Payer: 59 | Source: Home / Self Care | Attending: Family Medicine | Admitting: Family Medicine

## 2014-11-03 ENCOUNTER — Encounter (HOSPITAL_COMMUNITY): Payer: Self-pay | Admitting: *Deleted

## 2014-11-03 DIAGNOSIS — N4282 Prostatosis syndrome: Secondary | ICD-10-CM | POA: Diagnosis not present

## 2014-11-03 MED ORDER — DICLOFENAC POTASSIUM 50 MG PO TABS: 50.0000 mg | ORAL_TABLET | Freq: Three times a day (TID) | ORAL | Status: DC

## 2014-11-03 MED ORDER — DOXYCYCLINE HYCLATE 100 MG PO CAPS: 100.0000 mg | ORAL_CAPSULE | Freq: Two times a day (BID) | ORAL | Status: DC

## 2014-11-03 NOTE — Discharge Instructions (Signed)
Take medicine as prescribed, see urologist for recheck.

## 2014-11-03 NOTE — ED Notes (Signed)
Pt  Reports  Pain      Pain    r   Side  Groin  Pain  X  3  Days  Pt  Reports  Has  Had  epidytimitis      In past    Pt  Reports  Symptoms  X  3  Days  denys     Any  Injury           Pt  Reports    Has     Been taking  Aleve  And  ibuprophen  For  Same

## 2014-11-03 NOTE — ED Provider Notes (Signed)
CSN: [id number]     Arrival date & time 11/03/14  1913 History   First MD Initiated Contact with Patient 11/03/14 1957     Chief Complaint  Patient presents with  . Groin Pain   (Consider location/radiation/quality/duration/timing/severity/associated sxs/prior Treatment) Patient is a 49 y.o. male presenting with groin pain. The history is provided by the patient.  Groin Pain This is a recurrent problem. The current episode started more than 2 days ago. The problem has been gradually worsening. Pertinent negatives include no abdominal pain.    Past Medical History  Diagnosis Date  . Depression   . Hypogonadism male   . Headache(784.0)   . Diabetes mellitus     type II  . GERD (gastroesophageal reflux disease)    Past Surgical History  Procedure Laterality Date  . Tonsillectomy  1972  . Nasal sinus surgery  04/1991   Family History  Problem Relation Age of Onset  . Adopted: Yes  . Cancer Father 31    Prostate cancer  . Healthy Mother   . Diabetes Mellitus I Son    Social History  Substance Use Topics  . Smoking status: Never Smoker   . Smokeless tobacco: Never Used  . Alcohol Use: No    Review of Systems  Constitutional: Negative for fever and chills.  Gastrointestinal: Negative.  Negative for nausea, vomiting and abdominal pain.  Genitourinary: Positive for scrotal swelling. Negative for dysuria, urgency, hematuria, flank pain, discharge, penile swelling, penile pain and testicular pain.  Musculoskeletal: Negative.     Allergies  Eggs or egg-derived products; Penicillins; Pioglitazone; Promethazine hcl; and Reglan  Home Medications   Prior to Admission medications   Medication Sig Start Date End Date Taking? Authorizing Provider  Alcohol Swabs (ALCOHOL PREPS) PADS Use alcohol pads to clean site to be used to check blood sugar. Patient not taking: Reported on 10/24/2014 05/02/14   Philemon Kingdom, MD  Blood Glucose Monitoring Suppl (BAYER CONTOUR NEXT USB MONITOR)  W/DEVICE KIT Use kit as directed Patient not taking: Reported on 10/24/2014 05/02/14   Philemon Kingdom, MD  diclofenac (CATAFLAM) 50 MG tablet Take 1 tablet (50 mg total) by mouth 3 (three) times daily. For prostate pain 11/03/14   Billy Fischer, MD  doxycycline (VIBRAMYCIN) 100 MG capsule Take 1 capsule (100 mg total) by mouth 2 (two) times daily. 11/03/14   Billy Fischer, MD  finasteride (PROSCAR) 5 MG tablet Take 1 tablet (5 mg total) by mouth daily. 10/24/14   Marcial Pacas, DO  gabapentin (NEURONTIN) 300 MG capsule Take 1 capsule (300 mg total) by mouth at bedtime as needed (Neck pain or radicular pain from buldging disc.). 10/30/14   Marcial Pacas, DO  glucagon (GLUCAGON EMERGENCY) 1 MG injection Inject 1 mg into the muscle once as needed. Patient not taking: Reported on 10/24/2014 02/17/12   Burnice Logan, MD  glucose blood (BAYER CONTOUR NEXT TEST) test strip Use to test blood sugar 4 times daily as instructed. Dx: E11.65 Patient not taking: Reported on 10/24/2014 06/26/14   Philemon Kingdom, MD  Insulin Glargine (LANTUS SOLOSTAR) 100 UNIT/ML Solostar Pen Inject under skin 16 units 2x a day Patient taking differently: Inject 12 Units into the skin 2 (two) times daily. Inject under skin 16 units 2x a day 08/01/14   Philemon Kingdom, MD  insulin lispro (HUMALOG KWIKPEN) 100 UNIT/ML KiwkPen Inject 0.05-0.1 mLs (5-10 Units total) into the skin 3 (three) times daily. Patient not taking: Reported on 10/24/2014 08/01/14   Philemon Kingdom,  MD  LANCETS MICRO THIN 33G MISC Use lancet to test blood sugar as directed by your physician. Patient not taking: Reported on 10/24/2014 05/02/14   Philemon Kingdom, MD  lisinopril (PRINIVIL,ZESTRIL) 2.5 MG tablet Take 1 tablet (2.5 mg total) by mouth daily. 06/06/14   Brunetta Jeans, PA-C  metFORMIN (GLUCOPHAGE) 1000 MG tablet TAKE 1 TABLET (1,000 MG) BY MOUTH 2 TIMES A DAY WITH A MEAL. *PT NEEDS APPT FOR FURTHER REFILLS* 09/18/14   Philemon Kingdom, MD  Multiple Minerals  (CALCIUM-MAGNESIUM-ZINC) TABS Take 1 tablet by mouth daily.    Historical Provider, MD  Multiple Vitamins-Minerals (MULTIVITAMIN & MINERAL PO) Take 1 tablet by mouth daily.    Historical Provider, MD  SAW PALMETTO, SERENOA REPENS, PO Take 350 mg by mouth daily.    Historical Provider, MD   BP 114/73 mmHg  Pulse 98  Temp(Src) 98.5 F (36.9 C) (Oral)  Resp 22  SpO2 99% Physical Exam  Constitutional: He is oriented to person, place, and time.  Genitourinary: Rectum normal, testes normal and penis normal.    Prostate is enlarged and tender. Circumcised.  Lymphadenopathy:       Right: No inguinal adenopathy present.       Left: No inguinal adenopathy present.  Neurological: He is alert and oriented to person, place, and time.    ED Course  Procedures (including critical care time) Labs Review Labs Reviewed - No data to display  Imaging Review No results found.   MDM   1. Prostatitis syndrome        Billy Fischer, MD 11/03/14 2035

## 2014-11-04 ENCOUNTER — Encounter: Payer: Self-pay | Admitting: Family Medicine

## 2014-11-04 NOTE — Telephone Encounter (Signed)
LMOM notifying pt to come by to pick up neck exercises.

## 2014-11-14 ENCOUNTER — Ambulatory Visit (INDEPENDENT_AMBULATORY_CARE_PROVIDER_SITE_OTHER): Payer: 59 | Admitting: Family Medicine

## 2014-11-14 ENCOUNTER — Ambulatory Visit: Payer: Self-pay | Admitting: Family Medicine

## 2014-11-14 ENCOUNTER — Encounter: Payer: Self-pay | Admitting: Family Medicine

## 2014-11-14 VITALS — BP 112/73 | HR 58 | Wt 188.0 lb

## 2014-11-14 DIAGNOSIS — M5412 Radiculopathy, cervical region: Secondary | ICD-10-CM | POA: Diagnosis not present

## 2014-11-14 DIAGNOSIS — N451 Epididymitis: Secondary | ICD-10-CM | POA: Diagnosis not present

## 2014-11-14 MED ORDER — MELOXICAM 15 MG PO TABS: 15.0000 mg | ORAL_TABLET | Freq: Every day | ORAL | Status: DC

## 2014-11-14 NOTE — Progress Notes (Signed)
CC: Nathaniel [name] is a 49 y.o. male is here for Follow-up   Subjective: HPI:  Follow-up prostatitis: He was recently diagnosed with this at a local emergency room and given doxycycline. Symptoms have improved since starting doxycycline but he does not feel like diclofenac is doing anything when he takes it. He still has a little bit of pain above and posterior to the right testicle. He reports that he had a prostate exam last week and that it was not painful. Feels that he's emptying his bladder easier since starting finasteride.  Now not waking up as much to urinate.  Denies any dysuria, penile discharge or fevers.  Follow-up cervical radiculitis: He continues to have mild right-sided posterior neck pain and mild numbness just lateral to the right bicep. Symptoms are only slightly interfere with quality of life, he is okay with dealing with if there is no medication to help with it but he would like to explore other options beyond epidurals and gabapentin. He denies any new motor or sensory disturbances other than that described above. He denies weakness in the arm   Review Of Systems Outlined In HPI  Past Medical History  Diagnosis Date  . Depression   . Hypogonadism male   . Headache(784.0)   . Diabetes mellitus     type II  . GERD (gastroesophageal reflux disease)     Past Surgical History  Procedure Laterality Date  . Tonsillectomy  1972  . Nasal sinus surgery  04/1991   Family History  Problem Relation Age of Onset  . Adopted: Yes  . Cancer Father 24    Prostate cancer  . Healthy Mother   . Diabetes Mellitus I Son     Social History   Social History  . Marital Status: Married    Spouse Name: Nathaniel [name]  . Number of Children: 2  . Years of Education: N/A   Occupational History  .      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 Smoking status: Never Smoker   . Smokeless tobacco: Never Used  . Alcohol Use: No  . Drug Use: No  . Sexual Activity:  Not on file   Other Topics Concern  . Not on file   Social History Narrative   He lives with wife and two children.   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   Highest level of education: master degree     Objective: BP 112/73 mmHg  Pulse 58  Wt 188 lb (85.276 kg)  Vital signs reviewed. General: Alert and Oriented, No Acute Distress HEENT: Pupils equal, round, reactive to light. Conjunctivae clear.  External ears unremarkable.  Moist mucous membranes. Lungs: Clear and comfortable work of breathing, speaking in full sentences without accessory muscle use. Cardiac: Regular rate and rhythm.  Neuro: CN II-XII grossly intact, gait normal. Extremities: No peripheral edema.  Strong peripheral pulses.  Mental Status: No depression, anxiety, nor agitation. Logical though process. Skin: Warm and dry. Assessment & Plan: Nathaniel [name] was seen today for follow-up.  Diagnoses and all orders for this visit:  Cervical radiculitis  Epididymitis  Other orders -     meloxicam (MOBIC) 15 MG tablet; Take 1 tablet (15 mg total) by mouth daily.    I suspect his pain in the genital region is most likely epididymitis not prostatitis. Since it's improving with doxycycline will not change at this time howeverif not improving after next week will offer Cipro.  Stop diclofenac  start meloxicam Cervical arthritis: We'll see meloxicam is helpful, once we get Lyrica samples I will call him to start on Lyrica if meloxicam was ineffective.   Return if symptoms worsen or fail to improve.

## 2014-11-17 ENCOUNTER — Telehealth: Payer: Self-pay | Admitting: Family Medicine

## 2014-11-17 NOTE — Telephone Encounter (Signed)
Andrea/Kelsi, Will you please let patient know that Lyrica should be available later this week, I got in touch with the pharmaceutical rep today and she was willing to send the samples in the mail. I'll let him know once they're available.

## 2014-11-18 NOTE — Telephone Encounter (Signed)
Left message on vm

## 2014-11-21 ENCOUNTER — Ambulatory Visit (INDEPENDENT_AMBULATORY_CARE_PROVIDER_SITE_OTHER): Payer: Self-pay | Admitting: Family Medicine

## 2014-11-21 ENCOUNTER — Ambulatory Visit: Payer: 59 | Admitting: Pharmacist

## 2014-11-21 ENCOUNTER — Encounter: Payer: Self-pay | Admitting: Pharmacist

## 2014-11-21 VITALS — BP 110/60 | Ht 71.5 in | Wt 188.0 lb

## 2014-11-21 DIAGNOSIS — E1165 Type 2 diabetes mellitus with hyperglycemia: Secondary | ICD-10-CM

## 2014-11-21 NOTE — Progress Notes (Signed)
Subjective:  Patient presents today for 3 month diabetes follow-up as part of the employer-sponsored [employer] to Well[employer] program.  Current diabetes regimen includes Lantus 16 units SQ every morning and 6 units in the evening; Humalog with meals & metformin 1000 mg BID. Patient also continues on daily ACE Inhibitor.    Last appointment with Dr. Elvera Lennox was in July. A1C at that visit was 7.7%. No changes were made to medications at that visit.   Patient reports that he is in more pain lately, pain score of 1-2 today. He re-injured his neck doing a 30 day fitness challenge. He is waiting to try samples of Lyrica from Dr. Dorothe Pea.   New medications since last visit- addition of saw palmetto, meloxicam and finasteride.    Assessment/Plan:  Patient is a 49 y.o. male with DM 2. Most recent A1C was   7.7% which is exceeding goal of less than 7%. Weight is stable from last visit with me.   CBG Review: Patient checking 3-4 times daily.   He denies missing Humalog doses. He is no longer writing down CBGs in his logbook. He states that he is correcting for high CBG readings but admits he hasn't been paying as much attention to his CBGs lately. He was overwhelmed with the work it was taking before and states that it was consuming his life. I encouraged him to find a balance in managing CBGs so that he still felt good with CBGs in range but it was consuming his life.   Denies hypoglycemia.   Fasting- most of the readings are in the 160-170. Post prandial readings are variable- 140-300; most are high 100s, low 200s.   Lantus was decreased at his last visit with Dr. Elvera Lennox. Since then CBGs have been somewhat elevated.   Lifestyle improvements:  Physical Activity-  He hasn't been exercising because of the weather. Previously he was walking 5-6 days a week. He was also running but he has been unable to do so in the last few weeks since he re-injuring his neck.   Nutrition-  He was tracking his food into  [employer]. He didn't feel like it was helping him much so he stopped. He was doing well with drinking water instead of Diet Pepsi. He has started drinking them again but it only drinking 3-4 cans daily now.    Follow up with me in 3 months.    Goals for Next Visit:  1. Start writing down your blood sugars on a paper log again. Use this information to help you fine tune your Humalog and Lantus dosing.  2. Resume physical activity with a goal of getting back to 150 minutes a week.     Next appointment to see me is: Friday December 2nd at 3 PM.   Aundra Millet D. Vivia Ewing, PharmD, BCPS, CDE [facility] [place] [facility] Clinical Pharmacist & Diabetes Care Coordinator [phone]

## 2014-11-26 ENCOUNTER — Telehealth: Payer: Self-pay | Admitting: Family Medicine

## 2014-11-26 MED ORDER — PREGABALIN 75 MG PO CAPS: 75.0000 mg | ORAL_CAPSULE | Freq: Two times a day (BID) | ORAL | Status: DC

## 2014-11-26 NOTE — Telephone Encounter (Signed)
Samples up front and left message on pt's vm

## 2014-11-26 NOTE — Telephone Encounter (Signed)
Sue Lush, Lyrica samples have arrived for this patient and are in your in box.  Will you please make them available to him and log it in the sample folder?

## 2014-11-26 NOTE — Progress Notes (Signed)
ATTENDING PHYSICIAN NOTE: I have reviewed the chart and agree with the plan as detailed above. Rhodia Acres MD Pager [phone]  

## 2014-11-28 ENCOUNTER — Telehealth: Payer: Self-pay | Admitting: Family Medicine

## 2014-11-28 ENCOUNTER — Encounter: Payer: Self-pay | Admitting: Family Medicine

## 2014-11-28 NOTE — Telephone Encounter (Signed)
Message left on his vm

## 2014-11-28 NOTE — Telephone Encounter (Signed)
Nathaniel [name], Can you please try contacting patient again about samples that are up front?

## 2014-12-03 ENCOUNTER — Other Ambulatory Visit: Payer: Self-pay | Admitting: Internal Medicine

## 2014-12-11 ENCOUNTER — Other Ambulatory Visit: Payer: Self-pay | Admitting: Family Medicine

## 2014-12-11 ENCOUNTER — Encounter: Payer: Self-pay | Admitting: Family Medicine

## 2014-12-12 MED ORDER — MELOXICAM 15 MG PO TABS: 15.0000 mg | ORAL_TABLET | Freq: Every day | ORAL | Status: DC

## 2014-12-30 ENCOUNTER — Ambulatory Visit (INDEPENDENT_AMBULATORY_CARE_PROVIDER_SITE_OTHER): Payer: 59 | Admitting: Osteopathic Medicine

## 2014-12-30 ENCOUNTER — Encounter: Payer: Self-pay | Admitting: Osteopathic Medicine

## 2014-12-30 VITALS — BP 107/62 | HR 59 | Ht 71.5 in | Wt 188.0 lb

## 2014-12-30 DIAGNOSIS — J209 Acute bronchitis, unspecified: Secondary | ICD-10-CM | POA: Diagnosis not present

## 2014-12-30 DIAGNOSIS — R05 Cough: Secondary | ICD-10-CM

## 2014-12-30 DIAGNOSIS — R059 Cough, unspecified: Secondary | ICD-10-CM

## 2014-12-30 MED ORDER — AZITHROMYCIN 250 MG PO TABS: ORAL_TABLET | ORAL | Status: DC

## 2014-12-30 MED ORDER — BENZONATATE 200 MG PO CAPS: 200.0000 mg | ORAL_CAPSULE | Freq: Three times a day (TID) | ORAL | Status: DC | PRN

## 2014-12-30 MED ORDER — GUAIFENESIN-CODEINE 100-10 MG/5ML PO SYRP: 5.0000 mL | ORAL_SOLUTION | Freq: Three times a day (TID) | ORAL | Status: DC | PRN

## 2014-12-30 NOTE — Progress Notes (Signed)
HPI: Nathaniel [name] is a 49 y.o. male who presents to [place]  today for chief complaint of:  Chief Complaint  Patient presents with  . Cough    x9 days  . Sore Throat    . Location: chest cold . Quality: congestion/cough . Severity: mild to moderate . Duration: 9 - 10 days . Context: happens about once or twice per year . Modifying factors: tried nyquil, no better . Assoc signs/symptoms: chills, no fever. Cough nonproductive. Glc running normally per pt   Past medical, social and family history reviewed: Past Medical History  Diagnosis Date  . Depression   . Hypogonadism male   . Headache(784.0)   . Diabetes mellitus     type II  . GERD (gastroesophageal reflux disease)    Past Surgical History  Procedure Laterality Date  . Tonsillectomy  1972  . Nasal sinus surgery  04/1991   Social History  Substance Use Topics  . Smoking status: Never Smoker   . Smokeless tobacco: Never Used  . Alcohol Use: No   Family History  Problem Relation Age of Onset  . Adopted: Yes  . Cancer Father 91    Prostate cancer  . Healthy Mother   . Diabetes Mellitus I Son     Current Outpatient Prescriptions  Medication Sig Dispense Refill  . Alcohol Swabs (ALCOHOL PREPS) PADS Use alcohol pads to clean site to be used to check blood sugar. 100 each 11  . Blood Glucose Monitoring Suppl (BAYER CONTOUR NEXT USB MONITOR) W/DEVICE KIT Use kit as directed 1 kit 0  . finasteride (PROSCAR) 5 MG tablet Take 1 tablet (5 mg total) by mouth daily. 90 tablet 1  . glucagon (GLUCAGON EMERGENCY) 1 MG injection Inject 1 mg into the muscle once as needed. 1 each 3  . glucose blood (BAYER CONTOUR NEXT TEST) test strip Use to test blood sugar 4 times daily as instructed. Dx: E11.65 125 each 11  . Insulin Glargine (LANTUS SOLOSTAR) 100 UNIT/ML Solostar Pen Inject under skin 16 units 2x a day (Patient taking differently: Inject 12 Units into the skin 2 (two) times daily.  Inject under skin 16 units in the morning and 6 units at night) 15 pen 2  . insulin lispro (HUMALOG KWIKPEN) 100 UNIT/ML KiwkPen Inject 0.05-0.1 mLs (5-10 Units total) into the skin 3 (three) times daily. 15 mL 2  . LANCETS MICRO THIN 33G MISC Use lancet to test blood sugar as directed by your physician. 100 each 11  . lisinopril (PRINIVIL,ZESTRIL) 2.5 MG tablet Take 1 tablet (2.5 mg total) by mouth daily. 90 tablet 3  . meloxicam (MOBIC) 15 MG tablet Take 1 tablet (15 mg total) by mouth daily. 30 tablet 0  . metFORMIN (GLUCOPHAGE) 1000 MG tablet Take 1 tablet (1,000 mg total) by mouth 2 (two) times daily with a meal. 60 tablet 2  . Multiple Minerals (CALCIUM-MAGNESIUM-ZINC) TABS Take 1 tablet by mouth daily.    . Multiple Vitamins-Minerals (MULTIVITAMIN & MINERAL PO) Take 1 tablet by mouth daily.    . SAW PALMETTO, SERENOA REPENS, PO Take 350 mg by mouth daily.     No current facility-administered medications for this visit.   Allergies  Allergen Reactions  . Cymbalta [Duloxetine Hcl]     Suicidal thoughts  . Eggs Or Egg-Derived Products     childhood  . Penicillins   . Pioglitazone Other (See Comments)      headache  . Promethazine Hcl Nausea And  Vomiting       . Reglan [Metoclopramide] Other (See Comments)    Jittery, increased anxiety      Review of Systems: CONSTITUTIONAL: (+) subjective fever/chills, no unintentional weight changes HEAD/EYES/EARS/NOSE/THROAT: No headache/vision change or hearing change, no sore throat CARDIAC: No chest pain/pressure/palpitations, no orthopnea RESPIRATORY: (+) cough, no shortness of breath/wheeze GASTROINTESTINAL: No nausea/vomiting/abdominal pain/blood in stool/diarrhea/constipation MUSCULOSKELETAL: (+) myalgia/arthralgia ENDOCRINE: No polyuria/polydipsia/polyphagia, no heat/cold intolerance  NEUROLOGIC: No weakness/dizzines/slurred speech   Exam:  BP 107/62 mmHg  Pulse 59  Ht 5' 11.5" (1.816 m)  Wt 188 lb (85.276 kg)  BMI 25.86  kg/m2  SpO2 100% Constitutional: VSS, see above. General Appearance: alert, well-developed, well-nourished, NAD Eyes: Normal lids and conjunctive, non-icteric sclera, PERRLA Ears, Nose, Mouth, Throat: Normal external inspection ears/nares/mouth/lips/gums, Normal TM bilaterally, MMM, posterior pharynx without erythema/exudate Neck: No masses, trachea midline. No thyroid enlargement/tenderness/mass appreciated Respiratory: Normal respiratory effort. no wheeze/rhonchi/rales Cardiovascular: S1/S2 normal, no murmur/rub/gallop auscultated. RRR.   No results found for this or any previous visit (from the past 72 hour(s)).    ASSESSMENT/PLAN:  Cough - Plan: guaiFENesin-codeine (ROBITUSSIN AC) 100-10 MG/5ML syrup, benzonatate (TESSALON) 200 MG capsule  Acute bronchitis, unspecified organism - Plan: azithromycin (ZITHROMAX) 250 MG tablet   Call if no better, may consider CXR or escalation abx, honey also ok for cough, f/u Dr Ileene Rubens as directed. Low suspicion for PNA or sinus infection.

## 2015-01-09 ENCOUNTER — Ambulatory Visit: Payer: 59 | Admitting: Internal Medicine

## 2015-01-23 ENCOUNTER — Ambulatory Visit: Payer: 59 | Admitting: Family Medicine

## 2015-01-23 ENCOUNTER — Ambulatory Visit (INDEPENDENT_AMBULATORY_CARE_PROVIDER_SITE_OTHER): Payer: 59 | Admitting: Family Medicine

## 2015-01-23 VITALS — BP 114/79 | HR 71 | Wt 188.0 lb

## 2015-01-23 DIAGNOSIS — M5412 Radiculopathy, cervical region: Secondary | ICD-10-CM

## 2015-01-25 ENCOUNTER — Encounter: Payer: Self-pay | Admitting: Family Medicine

## 2015-01-25 DIAGNOSIS — M5412 Radiculopathy, cervical region: Secondary | ICD-10-CM

## 2015-01-25 HISTORY — DX: Radiculopathy, cervical region: M54.12

## 2015-01-25 NOTE — Progress Notes (Signed)
CC: Nathaniel LukesJeffrey A [name] is a 49 y.o. male is here for Follow-up   Subjective: HPI:  Follow-up cervical radiculitis: Stated that Lyrica did not provide much benefit. He has good days and bad days today he is asymptomatic. On bad days he has posterior neck pain with radiation down the left arm. He denies any other motor or sensory disturbances. He's tried gabapentin but this did not help either. He had an MRI years ago and symptoms have not gotten better or worse overall since that MRI. Denies difficulty swallowing, intentional weight loss, swollen lymph nodes   Review Of Systems Outlined In HPI  Past Medical History  Diagnosis Date  . Depression   . Hypogonadism male   . Headache(784.0)   . Diabetes mellitus     type II  . GERD (gastroesophageal reflux disease)     Past Surgical History  Procedure Laterality Date  . Tonsillectomy  1972  . Nasal sinus surgery  04/1991   Family History  Problem Relation Age of Onset  . Adopted: Yes  . Cancer Father [id number]    Prostate cancer  . Healthy Mother   . Diabetes Mellitus I Son     Social History   Social History  . Marital Status: Married    Spouse Name: Nathaniel IslamMarva  . Number of Children: 2  . Years of Education: N/A   Occupational History  .      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 Smoking status: Never Smoker   . Smokeless tobacco: Never Used  . Alcohol Use: No  . Drug Use: No  . Sexual Activity: Not on file   Other Topics Concern  . Not on file   Social History Narrative   He lives with wife and two children.   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   Highest level of education: master degree     Objective: BP 114/79 mmHg  Pulse 71  Wt 188 lb (85.276 kg)  Vital signs reviewed. General: Alert and Oriented, No Acute Distress HEENT: Pupils equal, round, reactive to light. Conjunctivae clear.  External ears unremarkable.  Moist mucous membranes. Lungs: Clear and  comfortable work of breathing, speaking in full sentences without accessory muscle use. Cardiac: Regular rate and rhythm.  Neuro: CN II-XII grossly intact, gait normal. Extremities: No peripheral edema.  Strong peripheral pulses.  Mental Status: No depression, anxiety, nor agitation. Logical though process. Skin: Warm and dry. Assessment & Plan: Nathaniel [name] was seen today for follow-up.  Diagnoses and all orders for this visit:  Cervical radiculitis   cervical radiculitis: Discussed possibly getting a new MRI and sending him to sports medicine for steroid injections. He scared of steroids and the side effect of hyperglycemia and is currently satisfied with his state of symptoms.  25 minutes spent face-to-face during visit today of which at least 50% was counseling or coordinating care regarding: 1. Cervical radiculitis       Return in about 3 months (around 04/25/2015).

## 2015-02-02 ENCOUNTER — Encounter: Payer: Self-pay | Admitting: Internal Medicine

## 2015-02-14 ENCOUNTER — Emergency Department
Admission: EM | Admit: 2015-02-14 | Discharge: 2015-02-14 | Disposition: A | Discharge status: Home / Self Care | Payer: 59 | Source: Home / Self Care | Attending: Family Medicine | Admitting: Family Medicine

## 2015-02-14 ENCOUNTER — Encounter: Payer: Self-pay | Admitting: Emergency Medicine

## 2015-02-14 DIAGNOSIS — S39012A Strain of muscle, fascia and tendon of lower back, initial encounter: Secondary | ICD-10-CM

## 2015-02-14 MED ORDER — CYCLOBENZAPRINE HCL 10 MG PO TABS: ORAL_TABLET | ORAL | Status: DC

## 2015-02-14 MED ORDER — MELOXICAM 15 MG PO TABS: 15.0000 mg | ORAL_TABLET | Freq: Every day | ORAL | Status: DC

## 2015-02-14 NOTE — ED Provider Notes (Signed)
CSN: [id number]     Arrival date & time 02/14/15  1435 History   First MD Initiated Contact with Patient 02/14/15 1459     Chief Complaint  Patient presents with  . Back Pain      HPI Comments: While working out yesterday, patient experienced mild discomfort in his lower back after doing squats.  His back soreness became worse yesterday evening.  The pain does not radiate.   He denies bowel or bladder dysfunction, and no saddle numbness.  His pain is worse when standing.    Patient is a 49 y.o. male presenting with back pain. The history is provided by the patient.  Back Pain Location:  Lumbar spine Quality:  Aching Radiates to:  Does not radiate Pain severity:  Mild Pain is:  Worse during the day Onset quality:  Gradual Duration:  1 day Timing:  Constant Progression:  Unchanged Chronicity:  New Context comment:  Working out in gym Relieved by:  NSAIDs Worsened by:  Standing Ineffective treatments:  None tried Associated symptoms: no abdominal pain, no bladder incontinence, no bowel incontinence, no dysuria, no fever, no leg pain, no numbness, no paresthesias, no perianal numbness, no tingling and no weakness     Past Medical History  Diagnosis Date  . Depression   . Hypogonadism male   . Headache(784.0)   . Diabetes mellitus     type II  . GERD (gastroesophageal reflux disease)    Past Surgical History  Procedure Laterality Date  . Tonsillectomy  1972  . Nasal sinus surgery  04/1991   Family History  Problem Relation Age of Onset  . Adopted: Yes  . Cancer Father 31    Prostate cancer  . Healthy Mother   . Diabetes Mellitus I Son    Social History  Substance Use Topics  . Smoking status: Never Smoker   . Smokeless tobacco: Never Used  . Alcohol Use: No    Review of Systems  Constitutional: Negative for fever.  Gastrointestinal: Negative for abdominal pain and bowel incontinence.  Genitourinary: Negative for bladder incontinence and dysuria.    Musculoskeletal: Positive for back pain.  Neurological: Negative for tingling, weakness, numbness and paresthesias.  All other systems reviewed and are negative.   Allergies  Cymbalta; Eggs or egg-derived products; Penicillins; Pioglitazone; Promethazine hcl; and Reglan  Home Medications   Prior to Admission medications   Medication Sig Start Date End Date Taking? Authorizing Provider  Alcohol Swabs (ALCOHOL PREPS) PADS Use alcohol pads to clean site to be used to check blood sugar. 05/02/14   Philemon Kingdom, MD  Blood Glucose Monitoring Suppl (BAYER CONTOUR NEXT USB MONITOR) W/DEVICE KIT Use kit as directed 05/02/14   Philemon Kingdom, MD  cyclobenzaprine (FLEXERIL) 10 MG tablet Take one tab by mouth at bedtime for muscle spasm 02/14/15   Kandra Nicolas, MD  finasteride (PROSCAR) 5 MG tablet Take 1 tablet (5 mg total) by mouth daily. 10/24/14   Marcial Pacas, DO  glucagon (GLUCAGON EMERGENCY) 1 MG injection Inject 1 mg into the muscle once as needed. 02/17/12   Burnice Logan, MD  glucose blood (BAYER CONTOUR NEXT TEST) test strip Use to test blood sugar 4 times daily as instructed. Dx: E11.65 06/26/14   Philemon Kingdom, MD  Insulin Glargine (LANTUS SOLOSTAR) 100 UNIT/ML Solostar Pen Inject under skin 16 units 2x a day Patient taking differently: Inject 12 Units into the skin 2 (two) times daily. Inject under skin 16 units in the morning and 6 units  at night 08/01/14   Philemon Kingdom, MD  insulin lispro (HUMALOG KWIKPEN) 100 UNIT/ML KiwkPen Inject 0.05-0.1 mLs (5-10 Units total) into the skin 3 (three) times daily. 08/01/14   Philemon Kingdom, MD  LANCETS MICRO THIN 33G MISC Use lancet to test blood sugar as directed by your physician. 05/02/14   Philemon Kingdom, MD  lisinopril (PRINIVIL,ZESTRIL) 2.5 MG tablet Take 1 tablet (2.5 mg total) by mouth daily. 06/06/14   Brunetta Jeans, PA-C  meloxicam (MOBIC) 15 MG tablet Take 1 tablet (15 mg total) by mouth daily. Take with food each morning  02/14/15   Kandra Nicolas, MD  metFORMIN (GLUCOPHAGE) 1000 MG tablet Take 1 tablet (1,000 mg total) by mouth 2 (two) times daily with a meal. 12/03/14   Philemon Kingdom, MD   Meds Ordered and Administered this Visit  Medications - No data to display  BP 101/63 mmHg  Pulse 77  Temp(Src) 97.9 F (36.6 C) (Oral)  Ht 6' (1.829 m)  Wt 192 lb 4 oz (87.204 kg)  BMI 26.07 kg/m2  SpO2 99% No data found.   Physical Exam  Constitutional: He is oriented to person, place, and time. He appears well-developed and well-nourished. No distress.  HENT:  Head: Normocephalic.  Eyes: Conjunctivae are normal. Pupils are equal, round, and reactive to light.  Neck: Normal range of motion.  Cardiovascular: Normal heart sounds.   Pulmonary/Chest: Breath sounds normal.  Abdominal: There is no tenderness.  Musculoskeletal:       Lumbar back: He exhibits pain. He exhibits normal range of motion, no tenderness, no bony tenderness and no swelling.       Back:  Back:  Range of motion relatively well preserved.  Can heel/toe walk and squat without difficulty.  Patient has pain in left lower back as noted on diagram, but there is no tenderness to palpation at that location   Straight leg raising test is negative.  Sitting knee extension test is negative.  Strength and sensation in the lower extremities is normal.  Patellar and achilles reflexes are normal    Neurological: He is alert and oriented to person, place, and time.  Skin: Skin is warm and dry. No rash noted.  Nursing note and vitals reviewed.   ED Course  Procedures  None    MDM   1. Low back strain, initial encounter    Begin Mobic 2m QAM, and Flexeril 156mTID Apply ice pack for 20 to 30 minutes, 3 to 4 times daily  Continue until pain decreases.  Begin back exercises as tolerated. Followup with Dr. ThAundria Memsr [facility] two weeks.     StKandra NicolasMD 02/19/15 [phone]

## 2015-02-14 NOTE — Discharge Instructions (Signed)
Apply ice pack for 20 to 30 minutes, 3 to 4 times daily  Continue until pain decreases.  Begin back exercises as tolerated.   Lumbosacral Strain Lumbosacral strain is a strain of any of the parts that make up your lumbosacral vertebrae. Your lumbosacral vertebrae are the bones that make up the lower third of your backbone. Your lumbosacral vertebrae are held together by muscles and tough, fibrous tissue (ligaments).  CAUSES  A sudden blow to your back can cause lumbosacral strain. Also, anything that causes an excessive stretch of the muscles in the low back can cause this strain. This is typically seen when people exert themselves strenuously, fall, lift heavy objects, bend, or crouch repeatedly. RISK FACTORS  Physically demanding work.  Participation in pushing or pulling sports or sports that require a sudden twist of the back (tennis, golf, baseball).  Weight lifting.  Excessive lower back curvature.  Forward-tilted pelvis.  Weak back or abdominal muscles or both.  Tight hamstrings. SIGNS AND SYMPTOMS  Lumbosacral strain may cause pain in the area of your injury or pain that moves (radiates) down your leg.  DIAGNOSIS Your health care provider can often diagnose lumbosacral strain through a physical exam. In some cases, you may need tests such as X-ray exams.  TREATMENT  Treatment for your lower back injury depends on many factors that your clinician will have to evaluate. However, most treatment will include the use of anti-inflammatory medicines. HOME CARE INSTRUCTIONS   Avoid hard physical activities (tennis, racquetball, waterskiing) if you are not in proper physical condition for it. This may aggravate or create problems.  If you have a back problem, avoid sports requiring sudden body movements. Swimming and walking are generally safer activities.  Maintain good posture.  Maintain a healthy weight.  For acute conditions, you may put ice on the injured area.  Put ice in  a plastic bag.  Place a towel between your skin and the bag.  Leave the ice on for 20 minutes, 2-3 times a day.  When the low back starts healing, stretching and strengthening exercises may be recommended. SEEK MEDICAL CARE IF:  Your back pain is getting worse.  You experience severe back pain not relieved with medicines. SEEK IMMEDIATE MEDICAL CARE IF:   You have numbness, tingling, weakness, or problems with the use of your arms or legs.  There is a change in bowel or bladder control.  You have increasing pain in any area of the body, including your belly (abdomen).  You notice shortness of breath, dizziness, or feel faint.  You feel sick to your stomach (nauseous), are throwing up (vomiting), or become sweaty.  You notice discoloration of your toes or legs, or your feet get very cold. MAKE SURE YOU:   Understand these instructions.  Will watch your condition.  Will get help right away if you are not doing well or get worse.   This information is not intended to replace advice given to you by your health care provider. Make sure you discuss any questions you have with your health care provider.   Document Released: 12/15/2004 Document Revised: 03/28/2014 Document Reviewed: 10/24/2012 Elsevier Interactive Patient Education [employer]2016 [employer]

## 2015-02-14 NOTE — ED Notes (Signed)
Pt c/o low back pain injury while exercising yesterday, doing squats.

## 2015-02-17 ENCOUNTER — Encounter: Payer: Self-pay | Admitting: Family Medicine

## 2015-02-17 ENCOUNTER — Telehealth: Payer: 59 | Admitting: Nurse Practitioner

## 2015-02-17 DIAGNOSIS — N3000 Acute cystitis without hematuria: Secondary | ICD-10-CM | POA: Diagnosis not present

## 2015-02-17 MED ORDER — SULFAMETHOXAZOLE-TRIMETHOPRIM 800-160 MG PO TABS: 1.0000 | ORAL_TABLET | Freq: Two times a day (BID) | ORAL | Status: DC

## 2015-02-17 NOTE — Progress Notes (Signed)

## 2015-02-18 MED ORDER — DOXAZOSIN MESYLATE 2 MG PO TABS: 2.0000 mg | ORAL_TABLET | Freq: Every day | ORAL | Status: DC

## 2015-02-20 ENCOUNTER — Encounter: Payer: 59 | Attending: Internal Medicine | Admitting: *Deleted

## 2015-02-20 ENCOUNTER — Encounter: Payer: Self-pay | Admitting: *Deleted

## 2015-02-20 ENCOUNTER — Ambulatory Visit: Payer: 59 | Admitting: Pharmacist

## 2015-02-20 DIAGNOSIS — E1165 Type 2 diabetes mellitus with hyperglycemia: Secondary | ICD-10-CM

## 2015-02-20 DIAGNOSIS — IMO0001 Reserved for inherently not codable concepts without codable children: Secondary | ICD-10-CM

## 2015-02-20 DIAGNOSIS — E109 Type 1 diabetes mellitus without complications: Secondary | ICD-10-CM | POA: Insufficient documentation

## 2015-02-20 NOTE — Progress Notes (Signed)
Pump Upgrade Training: Appt start time: 0730 end time: 0900.   Assessment: Primary concerns today: Patient here for upgrade to Medtronic 630G  insulin pump. Patient has not completed training modules from [employer] but he has already completed the Pump Set Up steps   Medications: see list. Insulin used in pump is Humalog  Intervention:  Pump settings transferred directly from current pump to new pump by patient New meter ID added to pump for communicating Reviewed new features of pump with patient Suggested use of advanced features to patient in order to improve BG control Patient  is  signed up on [facility] Web Site Patient expressed understanding of info taught and is wearing pump by end of visit.  Follow Up  Patient to see MD for insulin dose adjustments

## 2015-02-20 NOTE — Patient Instructions (Signed)
Plan: Use Bolus Wizard for meal time and correction insulin at each meal and bedtime You can put 160 units in the Reservoir every 3 days based on needing about 45 units per day.  Call me or your MD if BG goes below 60 or above 300 mg/dl

## 2015-02-25 NOTE — Telephone Encounter (Signed)
Patient Name: Nathaniel [name] Gender: Male DOB: April 26, 1965 Age: [age] Y 5 M 21 D Return Phone Number: [phone] (Primary), [phone] (Secondary) Address: [address] [place]: [place] [id number] Client [name] - Client Client Site [facility] Physician Carlus PavlovGherghe, Cristina Contact Type Call Call Type Triage / Clinical Relationship To Patient Self Return Phone Number [phone] (Primary) Chief Complaint Blood Sugar High Initial Comment Caller states he just got an insulin pump last Friday. He needs it adjusted because his sugars are running higher than normal. PreDisposition Did not know what to do Nurse Assessment Nurse: Tera Materowan, RN, Elnita Maxwellheryl Date/Time (Eastern Time): 02/24/2015 5:34:22 PM Confirm and document reason for call. If symptomatic, describe symptoms. ---Caller states that he is a diabetic and he asked his pcp to put him on an insulin pump for better control this past wk. He started using the pump on Friday and so far his b/s have been running between 180-250 which is less than he normally runs but he was hoping for better control. Denies any s/s.

## 2015-02-27 ENCOUNTER — Telehealth: Payer: Self-pay | Admitting: Internal Medicine

## 2015-02-27 ENCOUNTER — Other Ambulatory Visit (INDEPENDENT_AMBULATORY_CARE_PROVIDER_SITE_OTHER): Payer: 59 | Admitting: *Deleted

## 2015-02-27 ENCOUNTER — Ambulatory Visit (INDEPENDENT_AMBULATORY_CARE_PROVIDER_SITE_OTHER): Payer: 59 | Admitting: Internal Medicine

## 2015-02-27 ENCOUNTER — Encounter: Payer: Self-pay | Admitting: Internal Medicine

## 2015-02-27 VITALS — BP 100/60 | HR 102 | Temp 97.8°F | Resp 12 | Wt 192.8 lb

## 2015-02-27 DIAGNOSIS — E1165 Type 2 diabetes mellitus with hyperglycemia: Secondary | ICD-10-CM

## 2015-02-27 DIAGNOSIS — Z794 Long term (current) use of insulin: Secondary | ICD-10-CM

## 2015-02-27 DIAGNOSIS — IMO0001 Reserved for inherently not codable concepts without codable children: Secondary | ICD-10-CM

## 2015-02-27 LAB — POCT GLYCOSYLATED HEMOGLOBIN (HGB A1C): Hemoglobin A1C: 9.3

## 2015-02-27 NOTE — Telephone Encounter (Signed)
Pt to come in today

## 2015-02-27 NOTE — Telephone Encounter (Signed)
I cannot make adjustments to the pump settings based on 2 blood sugars... Check the infusion site and if that is working well, he needs an extra bolus and to stay very well hydrated.  Can he come at 3 pm today for an appt ? >> I can see him at 3:15, but he needs to come earlier so we can download the pump. If he cannot come at that time, maybe he can see Pincus LargeBeverly Paddock this pm.

## 2015-02-27 NOTE — Patient Instructions (Addendum)
Please change the pump settings as follows:  - basal rates: 12 am: 1.1 units/h 10 am: 1.1 >> 1.2  - ICR: 9 >> 8.  Please decrease the ICR with dinner to 7.5 in 1 week if the sugars after dinner are not <180. - target: 100-120 - ISF: 35 - Insulin on Board: 4h - bolus wizard: on  Please continue: - Metformin 1000 mg 2x a day  Please return in 1 months with your sugar log.

## 2015-02-27 NOTE — Telephone Encounter (Signed)
Please read message below and advise.  

## 2015-02-27 NOTE — Telephone Encounter (Signed)
Pt states with the pump he is still having high 200 readings, right now his reading is 299, he had no carbs for breakfast, needs help

## 2015-02-27 NOTE — Progress Notes (Signed)
Patient ID: Nathaniel LukesJeffrey A Flynn, male   DOB: [birth year], 49 y.o.   MRN: [id number]  HPI: Nathaniel [name] is a 49 y.o.-year-old man, returning for f/u for DM2, dx 2001, insulin-dependent since 2010, uncontrolled, without complications. Last visit 5 mo ago.  He was briefly on an insulin pump >> felt anxious about it >> afraid of malfunction. He had to stop it and started injections >> sugars worse >> now back on a pump: Medtronic 670G.  He called this morning because his sugars were not decreasing from 300s and upper 200s, despite fasting and repeated insulin bolusing by pen. At the time of the appointment, his sugars are coming down, currently 214.  Last hemoglobin A1c was: Lab Results  Component Value Date   HGBA1C 7.7* 09/26/2014   HGBA1C 10.4* 05/16/2014   HGBA1C 10.1* 07/30/2013  11/08/2013: 9.5% 04/11/2013: 9.8%  Pt was on a regimen of: - Metformin 1000 mg 2x a day - Lantus 14 units in am and 10 units in hs  - Humalog insulin to carb ratio: 1:10. Eats <30 g carbs per meal. Takes 2-6 units per meal, usually 2-3 units. - target sugar 130 - insulin sensitivity factor 30 - 130-160: + 1 unit  - 161-190: + 2 units  - 191-220: + 3 units  - 221-250: + 4 units  - >251: + 5 units He is now back on a pump: Pump settings: - basal rates: 12 am: 1.1 units/h - ICR: 9 - target: 100-120 - ISF: 35 - Insulin on Board: 4h - bolus wizard: on - extended bolusing: not using - changes infusion site: q3 days - Meter: Bayer Contour  Pt checks his sugars 5-10x a day and they are:  - am:95-180, occasionally 200s >> 72-259 >> 111 >> 95-224 (usually high am CBGs if lows at night - 50s) >> 71-133 - 2h after b'fast: 86-289 >> n/c >> 86-170, 338 >> 216, 133 >> 164-214, 285 (after potluck) >> 67x1, 126-193, 245, 299 today - before lunch: 130-317 >> 97-290, most <200 >> 197-217 >> 180-250s >> 97-390 >> 91 >> 119-202 >> 153-197 - 2h after lunch: n/c >> 86-302 >> 122-236 >> 106-503 >> 122-173 >> 83-302 >> 72  >> 119-256 >> 140-269 - before dinner: 129-292 >> 67-260 >> 75-303 >> 98-242, 335 >> 130-342 >> 145 >> 143-189 >> 113-237 - 2h after dinner: n/c >> 101-337 >> 66, 102-197 >> 159-342 >> 149 >> 141-203 >> 126-379 - bedtime: 160-180 >> n/c >> 357 >> see above >> 116-224 Lows: 67x1  Pt's meals are: - Breakfast: cereal + 1% milk, with protein bar - Lunch: sandwich - Dinner: eating out or eating in - Snacks: 1-2 a day He has a low appetite. Walks 3-4x a week. He cut out caffeine. [employer] a lot for his job.  - no CKD, last BUN/creatinine:  Lab Results  Component Value Date   BUN 9 06/06/2014   CREATININE 0.98 06/06/2014  He is on Lisinopril. - last set of lipids: Lab Results  Component Value Date   CHOL 164 06/06/2014   HDL 55.10 06/06/2014   LDLCALC 85 06/06/2014   TRIG 122.0 06/06/2014   CHOLHDL 3 06/06/2014  - last eye exam was 10/2013. No DR.  - no numbness and tingling in his feet.   I reviewed pt's medications, allergies, PMH, social hx, family hx, and changes were documented in the history of present illness. Otherwise, unchanged from my initial visit note. Started Flomax.  ROS: Constitutional: no weight gain/loss, no  fatigue, no subjective hyperthermia/hypothermia Eyes: no blurry vision, no xerophthalmia ENT: no sore throat, no nodules palpated in throat, no dysphagia/odynophagia, no hoarseness Cardiovascular: no CP/SOB/palpitations/leg swelling Respiratory: no cough/no SOB Gastrointestinal: no N/V/D/C Musculoskeletal: + muscle aches/no joint aches Skin: no rashes Neurological: no tremors/numbness/tingling/dizziness  PE: BP 100/60 mmHg  Pulse 102  Temp(Src) 97.8 F (36.6 C) (Oral)  Resp 12  Wt 192 lb 12.8 oz (87.454 kg)  SpO2 97% Body mass index is 26.14 kg/(m^2).  Wt Readings from Last 3 Encounters:  02/27/15 192 lb 12.8 oz (87.454 kg)  02/14/15 192 lb 4 oz (87.204 kg)  01/23/15 188 lb (85.276 kg)   Constitutional: normal weight, in NAD, as usual, appears  very depressed Eyes: PERRLA, EOMI, no exophthalmos ENT: moist mucous membranes, no thyromegaly, no cervical lymphadenopathy Cardiovascular: RRR, No MRG Respiratory: CTA B Gastrointestinal: abdomen soft, NT, ND, BS+ Musculoskeletal: no deformities, strength intact in all 4 Skin: moist, warm, no rashes Neurological: no tremor with outstretched hands, DTR normal in all 4  ASSESSMENT: 1. DM2, insulin-dependent, uncontrolled, without complications - Received records with C-peptide of 3.67 (0.8-3.9), from 06/16/2005 ([facility]). No associated glucose reported. - son with DM1 (not on a pump)  PLAN:  1. Patient with long-standing, uncontrolled diabetes. He is now back on an insulin pump - started 1 week ago. He is disappointed that the sugars are not dramatically improved after he started the pump. I explained that that is rare, and it takes a while to get the insulin pump settings adjusted. He did have an episode today in which the sugars increased despite not eating and he had to bolus an additional 11 units of rapid acting insulin to actually start bringing the sugars down. I believe this was a site problem, as the pump alerted him last night about possible obstruction or bent cannula. He did change the site. - The sugars are great overnight and in a.m. and they start to increase after meals. For this reason, I will decrease his insulin to carb ratio with all of the meals, but since he is increasing his sugars more after dinner, I advised him that he may need an even lower ICR with that meal. We'll also increase his basal rate from 10 AM on.  - He did stop the metformin right after he attached the pump, but he restarted it today >> I advised him to continue - I advised him to: Patient Instructions  Please change the pump settings as follows:  - basal rates: 12 am: 1.1 units/h 10 am: 1.1 >> 1.2  - ICR: 9 >> 8.  Please decrease the ICR with dinner to 7.5 in 1 week if the sugars after dinner are  not <180. - target: 100-120 - ISF: 35 - Insulin on Board: 4h - bolus wizard: on  Please continue: - Metformin 1000 mg 2x a day  Please return in 1 month with your sugar log.    - continue checking sugars at different times of the day - check 4 times a day, rotating checks - needs a new yearly eye exam - will check HbA1c today >> 9.3% (increased, but he only started the pump 1 week ago). - Return to clinic in 1 mo with sugar log   - time spent with the patient: 40 min, of which >50% was spent in reviewing his pump settings and meter downloads, discussing his hypo- and hyper-glycemic episodes, reviewing previous labs and insulin dosing a plan to avoid hypo- and hyper-glycemia.

## 2015-02-28 ENCOUNTER — Encounter: Payer: Self-pay | Admitting: Family Medicine

## 2015-03-02 ENCOUNTER — Other Ambulatory Visit: Payer: Self-pay | Admitting: Family Medicine

## 2015-03-05 ENCOUNTER — Telehealth: Payer: Self-pay | Admitting: Internal Medicine

## 2015-03-05 NOTE — Telephone Encounter (Signed)
It is difficult to decide what needs to be changed based on one low blood sugar at night. However, if he feels that his sugars are lower after dinner, he may need to change the insulin to carb ratio from 8 to 8.5 (if he already decreased the insulin to carb ratio to 7.5, then please increase to 8). Also, we can decrease the basal rate at night to 1.05 (from 10 PM to 2 AM). Please also schedule an appointment with Bev in about a week to download his pump and for further adjustment.

## 2015-03-05 NOTE — Telephone Encounter (Signed)
Called pt and advised him per Dr Charlean SanfilippoGherghe's message below. Advised pt also to call the number on the back of his pump so they will guide him to make the changes in his pump settings. Pt voiced understanding.

## 2015-03-05 NOTE — Telephone Encounter (Signed)
Patient stated his b/s are really low it got down to 42 and almost lost conciseness, he says he needs his medication adjusted, please advise

## 2015-03-05 NOTE — Telephone Encounter (Signed)
Returned pt's call. He stated that he dropped to 42 at 1 am. He has had some 60's-70's during the day a few times. But nothing this low. Pt stated that he ate something and drank something, but it took some time to come back up. Pt feels he needs a medication adjustment. Please advise.

## 2015-03-06 ENCOUNTER — Ambulatory Visit: Payer: 59 | Admitting: Internal Medicine

## 2015-03-13 ENCOUNTER — Ambulatory Visit (INDEPENDENT_AMBULATORY_CARE_PROVIDER_SITE_OTHER): Payer: Self-pay | Admitting: Family Medicine

## 2015-03-13 ENCOUNTER — Encounter: Payer: Self-pay | Admitting: Pharmacist

## 2015-03-13 VITALS — BP 92/62 | Ht 72.0 in | Wt 189.2 lb

## 2015-03-13 DIAGNOSIS — Z794 Long term (current) use of insulin: Secondary | ICD-10-CM

## 2015-03-13 DIAGNOSIS — IMO0001 Reserved for inherently not codable concepts without codable children: Secondary | ICD-10-CM

## 2015-03-13 DIAGNOSIS — E1165 Type 2 diabetes mellitus with hyperglycemia: Secondary | ICD-10-CM

## 2015-03-13 NOTE — Progress Notes (Signed)
Subjective:  Patient presents today for 3 month diabetes follow-up as part of the employer-sponsored Link to Wellness program.  Current diabetes regimen includes Humalog via insulin pump and metformin 1000 mg BID. Patient also continues on daily ACE Inhibitor and statin.  Most recent MD follow-up was earlier this month with Dr. Elvera LennoxGherghe. Patient has a pending appt for February 2017.  No major health changes at this time. Medication changes- no longer taking cyclobenzaprine or meloxicam. He is also no longer using the Lantus pen or Humalog pen. Only using Humalog vials via his pump.   Patient has returned to using a pump. He states that he had better glycemic control when he was using the pump and despite his misgivings about wearing the pump he decided to give it another try. He has been on it for about 3 weeks. He states that the first week was rough with trying to get it adjusted but the last 2 weeks have been better. He states his numbers have also been better.   Assessment:  Diabetes: Most recent A1C was 9.3  % which is exceeding goal of less than 7%. Weight is stable from last visit with me.    CBG Review: He is checking CBG 7-8 times daily. He was having low blood sugars overnight but after he adjusted his nightly basal rate, he no longer is running low.   Overall CBGs appear improved since his last visit with me. A majority of fasting CBGs are less than 130 mg/dL.    Lifestyle improvements:  Physical Activity-  When the weather is good he is walking. Now that it is colder he is down to once a week. No other physical activity besides the walking.   Nutrition-  No major changes with eating habits. He is counting all of his carbohydrates and is using the bolus wizard to calculate insulin doses. He is sending all of his readings to his pump.  One of his goals last visit was to cut back on Diet Pepsi intake- he hasn't done that. He is not interested in changing to water at this time.   He  is tracking his food intake with My Fitness Pal and is getting less than 2000 calories daily.     Follow up with me in 3 months.    Plan/Goals for Next Visit:  1. Continue working on carbohydrate counting. Take time to read food labels and look up any food that you don't know what the carbohydrate information is.  2. On your days off, try to get physical activity two of the days. Ideas- walking, using dumbbells.     Next appointment to see me is: Friday March 31st at 3 PM.    Aundra MilletMegan D. Vivia EwingWheatley, PharmD, BCPS, CDE [facility] Clinical Pharmacist & Diabetes Care Coordinator [phone]

## 2015-03-14 ENCOUNTER — Encounter: Payer: Self-pay | Admitting: Internal Medicine

## 2015-03-17 ENCOUNTER — Encounter: Payer: Self-pay | Admitting: Family Medicine

## 2015-03-17 DIAGNOSIS — R300 Dysuria: Secondary | ICD-10-CM

## 2015-03-17 NOTE — Telephone Encounter (Signed)
Nathaniel [name], can you be of help to Mr Nathaniel [name]? Please advise.

## 2015-03-17 NOTE — Telephone Encounter (Signed)
I phoned him and left him a message to call me.  I suggested that if it is a problem with the infusion sets , that I can give him a few from a different lot number.  If it is a problem with site absorption, then we can discuss different site options to try.  I also suggested the possibility of a needle infusion set.

## 2015-03-20 ENCOUNTER — Encounter: Payer: 59 | Admitting: *Deleted

## 2015-03-20 ENCOUNTER — Encounter: Payer: Self-pay | Admitting: *Deleted

## 2015-03-20 DIAGNOSIS — IMO0001 Reserved for inherently not codable concepts without codable children: Secondary | ICD-10-CM

## 2015-03-20 DIAGNOSIS — Z794 Long term (current) use of insulin: Principal | ICD-10-CM

## 2015-03-20 DIAGNOSIS — E109 Type 1 diabetes mellitus without complications: Secondary | ICD-10-CM | POA: Diagnosis present

## 2015-03-20 DIAGNOSIS — E1165 Type 2 diabetes mellitus with hyperglycemia: Principal | ICD-10-CM

## 2015-03-20 NOTE — Progress Notes (Signed)
  Pump Follow Up Progress Note 03/20/15  Patient started on Medtronic 630G insulin pump on 02/20/15. He states he is pleased with his BG control on the pump especially with the most recent pump setting adjustments made by Dr. Elvera LennoxGherghe recently. However he is having trouble with excessive No Delivery Alarms. He has been using the Quick Set infusion set.   Today, he brought in samples of the Silhouette infusion set that Medtronic had sent him. I demonstrated self insertion as well as use of the Sil-serter. He preferred to self insert today, which he did successfully. He has been off of the pump for the last several days due to the No Delivery alerts, but states he had not taken any long acting insulin for more than 24 hours, so no Temp Basal was needed today. He states his BG this AM was about 120 mg/dl.  Plan: He will try the Silhouette infusion set for the next couple of change outs. If he is pleased, he will order more from Medtronic.  Follow up: Patient to follow up with Dr. Elvera LennoxGherghe as scheduled Patient aware of DM 1 Support Group. 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 He would then be able to be home earlier in the evenings and also be able to participate in Support Group activities.

## 2015-03-20 NOTE — Progress Notes (Signed)
I have reviewed this pharmacist's note and agree  

## 2015-03-30 ENCOUNTER — Encounter: Payer: Self-pay | Admitting: Emergency Medicine

## 2015-03-30 ENCOUNTER — Telehealth: Payer: 59 | Admitting: Family

## 2015-03-30 ENCOUNTER — Emergency Department
Admission: EM | Admit: 2015-03-30 | Discharge: 2015-03-30 | Disposition: A | Discharge status: Home / Self Care | Payer: 59 | Source: Home / Self Care | Attending: Family Medicine | Admitting: Family Medicine

## 2015-03-30 DIAGNOSIS — R300 Dysuria: Secondary | ICD-10-CM

## 2015-03-30 DIAGNOSIS — R350 Frequency of micturition: Secondary | ICD-10-CM | POA: Diagnosis not present

## 2015-03-30 LAB — POCT URINALYSIS DIP (MANUAL ENTRY)
Bilirubin, UA: NEGATIVE
Blood, UA: NEGATIVE
Glucose, UA: 100 — AB
Ketones, POC UA: NEGATIVE
Leukocytes, UA: NEGATIVE
Nitrite, UA: NEGATIVE
Protein Ur, POC: NEGATIVE
Spec Grav, UA: <=1.005
Urobilinogen, UA: 0.2
pH, UA: 6.5

## 2015-03-30 NOTE — Progress Notes (Signed)
We are sorry that you are not feeling well.  Here is how we plan to help!  Male bladder infections are not very common.  We worry about prostate or kidney conditions.  The standard of care is to examine the abdomen and kidneys, and to do a urine and blood test to make sure that something more serious is not going on.  We recommend that you see a provider today.  If your doctor's office is closed [facility] has the following Urgent Cares:   . [facility]  [phone] Get Driving Directions Find a Provider at this Location  [address] [place], [place] [address] . 8 am to 8 pm Monday-Friday . 9 am to 7 pm Saturday-Sunday  . [facility]  [phone] Get Driving Directions Find a Provider at this Location  [address], [address] [place], [place] [address] . 8 am to 8 pm Monday-Friday . 9 am to 6 pm Saturday . 11 am to 6 pm Sunday  . [facility]  [phone] Get Driving Directions  [address] [place], [place] [address] . 8 am to 8 pm Monday-Friday . 9 am to 4 pm Saturday-Sunday  . [facility] (a walk in primary care provider)  [phone]  Get Driving Directions Find a Provider at this Location  [address] [place], [place] [address] . 8 am to 8:30 pm Monday-Thursday . 8 am to 6 pm Friday . 8 am to 4 pm Saturday-Sunday  Your e-visit answers were reviewed by a board certified advanced clinical practitioner to complete your personal care plan.  Depending on the condition, your plan could have included both over the counter or prescription medications.  You will get an e-mail in the next two days asking about your experience.  I hope that your e-visit has been valuable and will speed your recovery. Thank you for using e-visits.    

## 2015-03-30 NOTE — ED Provider Notes (Signed)
CSN: [id number]     Arrival date & time 03/30/15  1849 History   First MD Initiated Contact with Patient 03/30/15 1905     Chief Complaint  Patient presents with  . Urinary Frequency  . Dysuria   (Consider location/radiation/quality/duration/timing/severity/associated sxs/prior Treatment) HPI  Pt is a 50yo male presenting to [facility] with c/o 10 day hx of dysuria and urinary frequency.  Pt has been seen several times by his PCP, Dr. Ileene Rubens, who has scheduled a f/u appointment with urology for 04/27/15.  Pt states he is concerned for a urinary tract infection as symptoms have not been improving and hopes to get an appointment sooner but states he can only go in on Fridays due to his work schedule. Denies fever, chills, n/v/d. Denies back pain or abdominal pain.  Pt states he was treated for a UTI 1 month ago, on bactrim, and has since seen Dr. Ileene Rubens who started him on doxazosin, but the medication caused dizziness and symptoms did not resolve.  He has not tried Azo yet.  Pt is a Type 1 Diabetic. Last CBG was taken at 1615= 182  Past Medical History  Diagnosis Date  . Depression   . Hypogonadism male   . Headache(784.0)   . Diabetes mellitus     type II  . GERD (gastroesophageal reflux disease)    Past Surgical History  Procedure Laterality Date  . Tonsillectomy  1972  . Nasal sinus surgery  04/1991   Family History  Problem Relation Age of Onset  . Adopted: Yes  . Cancer Father 46    Prostate cancer  . Healthy Mother   . Diabetes Mellitus I Son    Social History  Substance Use Topics  . Smoking status: Never Smoker   . Smokeless tobacco: Never Used  . Alcohol Use: No    Review of Systems  Constitutional: Negative for fever and chills.  Gastrointestinal: Negative for nausea, vomiting, abdominal pain and diarrhea.  Genitourinary: Positive for dysuria and frequency. Negative for urgency, hematuria, flank pain, decreased urine volume, discharge, penile swelling, scrotal swelling,  penile pain and testicular pain.  Musculoskeletal: Negative for myalgias, back pain and arthralgias.    Allergies  Cymbalta; Eggs or egg-derived products; Penicillins; Pioglitazone; Promethazine hcl; and Reglan  Home Medications   Prior to Admission medications   Medication Sig Start Date End Date Taking? Authorizing Provider  Alcohol Swabs (ALCOHOL PREPS) PADS Use alcohol pads to clean site to be used to check blood sugar. 05/02/14   Philemon Kingdom, MD  Blood Glucose Monitoring Suppl (BAYER CONTOUR NEXT USB MONITOR) W/DEVICE KIT Use kit as directed 05/02/14   Philemon Kingdom, MD  finasteride (PROSCAR) 5 MG tablet Take 1 tablet (5 mg total) by mouth daily. 10/24/14   Marcial Pacas, DO  glucagon (GLUCAGON EMERGENCY) 1 MG injection Inject 1 mg into the muscle once as needed. Patient not taking: Reported on 03/13/2015 02/17/12   Burnice Logan, MD  glucose blood (BAYER CONTOUR NEXT TEST) test strip Use to test blood sugar 4 times daily as instructed. Dx: E11.65 06/26/14   Philemon Kingdom, MD  Insulin Glargine (LANTUS SOLOSTAR) 100 UNIT/ML Solostar Pen Inject under skin 16 units 2x a day Patient not taking: Reported on 03/13/2015 08/01/14   Philemon Kingdom, MD  insulin lispro (HUMALOG KWIKPEN) 100 UNIT/ML KiwkPen Inject 0.05-0.1 mLs (5-10 Units total) into the skin 3 (three) times daily. Patient not taking: Reported on 03/13/2015 08/01/14   Philemon Kingdom, MD  [place]  Use lancet to test blood sugar as directed by your physician. 05/02/14   Philemon Kingdom, MD  lisinopril (PRINIVIL,ZESTRIL) 2.5 MG tablet Take 1 tablet (2.5 mg total) by mouth daily. 06/06/14   Brunetta Jeans, PA-C  metFORMIN (GLUCOPHAGE) 1000 MG tablet Take 1 tablet (1,000 mg total) by mouth 2 (two) times daily with a meal. 12/03/14   Philemon Kingdom, MD  saw palmetto 160 MG capsule Take 160 mg by mouth 2 (two) times daily.    Historical Provider, MD   Meds Ordered and Administered this Visit  Medications - No  data to display  BP 119/80 mmHg  Pulse 94  Temp(Src) 97.6 F (36.4 C) (Oral)  Resp 16  Ht 6' (1.829 m)  Wt 196 lb (88.905 kg)  BMI 26.58 kg/m2  SpO2 97% No data found.   Physical Exam  Constitutional: He appears well-developed and well-nourished.  HENT:  Head: Normocephalic and atraumatic.  Eyes: Conjunctivae are normal. No scleral icterus.  Neck: Normal range of motion. Neck supple.  Cardiovascular: Normal rate, regular rhythm and normal heart sounds.   Pulmonary/Chest: Effort normal and breath sounds normal. No respiratory distress. He has no wheezes. He has no rales. He exhibits no tenderness.  Abdominal: Soft. He exhibits no distension and no mass. There is no tenderness. There is no rebound, no guarding and no CVA tenderness.  Musculoskeletal: Normal range of motion.  Neurological: He is alert.  Skin: Skin is warm and dry.  Nursing note and vitals reviewed.   ED Course  Procedures (including critical care time)  Labs Review Labs Reviewed  POCT URINALYSIS DIP (MANUAL ENTRY) - Abnormal; Notable for the following:    Color, UA light yellow (*)    Glucose, UA =100 (*)    All other components within normal limits  URINE CULTURE    Imaging Review No results found.    MDM   1. Urinary frequency   2. Dysuria     Pt c/o persistent urinary frequency and dysuria.  He is waiting for his appointment with [facility] on 04/27/15.    UA- no evidence of underlying infection.  Small amount of glucose, however, CBG at 1615 today was 182.  Pt appears well, non-toxic. Doubt DKA.  Symptoms are chronic in nature. Reassured pt no evidence of infection at this time.  Advised pt he may try OTC Azo to see if the medication helps with his symptoms. Also encouraged pt to call [facility] to see if he can get an appointment sooner than 04/27/15.  F/u with PCP as needed. Patient verbalized understanding and agreement with treatment plan.     Noland Fordyce, PA-C 03/31/15  1018

## 2015-03-30 NOTE — ED Notes (Signed)
Reports 10 day history of dysuria, in addition to frequency; Dr.Hommel knows about this and has referred him to urologist on 04-27-15. Patient is type 1 diabetic; most recent bg at [id number].

## 2015-03-30 NOTE — Discharge Instructions (Signed)
You may try over the counter medication called Azo to help with bladder spasms and urinary discomfort.  This medication can make your urine orange, which is normal.

## 2015-03-31 ENCOUNTER — Encounter: Payer: Self-pay | Admitting: Family Medicine

## 2015-04-01 ENCOUNTER — Telehealth: Payer: Self-pay | Admitting: *Deleted

## 2015-04-01 ENCOUNTER — Other Ambulatory Visit: Payer: Self-pay | Admitting: Internal Medicine

## 2015-04-01 LAB — URINE CULTURE
Colony Count: NO GROWTH
Organism ID, Bacteria: NO GROWTH

## 2015-04-01 MED FILL — metFORMIN HCL 1000 MG TABS: 1000 | 30 days supply | Qty: 60 | Fill #0

## 2015-04-03 DIAGNOSIS — H52223 Regular astigmatism, bilateral: Secondary | ICD-10-CM | POA: Diagnosis not present

## 2015-04-03 DIAGNOSIS — H524 Presbyopia: Secondary | ICD-10-CM | POA: Diagnosis not present

## 2015-04-03 DIAGNOSIS — F341 Dysthymic disorder: Secondary | ICD-10-CM | POA: Diagnosis not present

## 2015-04-03 DIAGNOSIS — Z794 Long term (current) use of insulin: Secondary | ICD-10-CM | POA: Diagnosis not present

## 2015-04-03 DIAGNOSIS — H5202 Hypermetropia, left eye: Secondary | ICD-10-CM | POA: Diagnosis not present

## 2015-04-03 DIAGNOSIS — H5211 Myopia, right eye: Secondary | ICD-10-CM | POA: Diagnosis not present

## 2015-04-03 DIAGNOSIS — E119 Type 2 diabetes mellitus without complications: Secondary | ICD-10-CM | POA: Diagnosis not present

## 2015-04-03 LAB — HM DIABETES EYE EXAM

## 2015-04-06 ENCOUNTER — Telehealth: Payer: Self-pay | Admitting: Internal Medicine

## 2015-04-06 DIAGNOSIS — Z0000 Encounter for general adult medical examination without abnormal findings: Secondary | ICD-10-CM | POA: Diagnosis not present

## 2015-04-06 DIAGNOSIS — N410 Acute prostatitis: Secondary | ICD-10-CM | POA: Diagnosis not present

## 2015-04-06 DIAGNOSIS — R300 Dysuria: Secondary | ICD-10-CM | POA: Diagnosis not present

## 2015-04-06 MED FILL — DOXYCYCLINE HYC 100 MG CAP: 100 | 21 days supply | Qty: 42 | Fill #0

## 2015-04-06 MED FILL — TAMSULOSIN HCL 0.4 MG CAP: 0.4 | 30 days supply | Qty: 30 | Fill #0

## 2015-04-06 NOTE — Telephone Encounter (Signed)
Patient ask if you have received His information from [facility], please advise

## 2015-04-07 ENCOUNTER — Other Ambulatory Visit: Payer: Self-pay | Admitting: Family Medicine

## 2015-04-07 DIAGNOSIS — N400 Enlarged prostate without lower urinary tract symptoms: Secondary | ICD-10-CM

## 2015-04-07 NOTE — Telephone Encounter (Signed)
No, I have not - yet. At least, it is not scanned or abstracted.Marland KitchenMarland Kitchen

## 2015-04-07 NOTE — Telephone Encounter (Signed)
Please read message below and advise.  

## 2015-04-08 NOTE — Telephone Encounter (Signed)
Called pt and advised him that we have not received anything from them. Pt stated he had an eye exam and wanted Dr Elvera Lennox to know. He will contact them again.

## 2015-04-09 ENCOUNTER — Encounter: Payer: Self-pay | Admitting: Internal Medicine

## 2015-04-10 ENCOUNTER — Telehealth: Payer: Self-pay | Admitting: Internal Medicine

## 2015-04-10 ENCOUNTER — Other Ambulatory Visit: Payer: Self-pay | Admitting: Internal Medicine

## 2015-04-10 MED FILL — HumaLOG 100 UNIT/ML SOLN: 100 | 30 days supply | Qty: 20 | Fill #0

## 2015-04-10 NOTE — Telephone Encounter (Signed)
Patient need refill of Humalog, send to. [facility] [facility] - [place] - [address] [phone] (Phone) [phone] (Fax)

## 2015-04-10 NOTE — Telephone Encounter (Signed)
Done

## 2015-04-16 ENCOUNTER — Encounter: Payer: Self-pay | Admitting: Internal Medicine

## 2015-04-17 DIAGNOSIS — F341 Dysthymic disorder: Secondary | ICD-10-CM | POA: Diagnosis not present

## 2015-04-24 DIAGNOSIS — N410 Acute prostatitis: Secondary | ICD-10-CM | POA: Diagnosis not present

## 2015-04-24 DIAGNOSIS — Z0000 Encounter for general adult medical examination without abnormal findings: Secondary | ICD-10-CM | POA: Diagnosis not present

## 2015-04-24 DIAGNOSIS — R300 Dysuria: Secondary | ICD-10-CM | POA: Diagnosis not present

## 2015-04-28 MED FILL — LISINOPRIL 2.5 MG TABLET: 2.5 | 90 days supply | Qty: 90 | Fill #3

## 2015-05-01 ENCOUNTER — Ambulatory Visit (INDEPENDENT_AMBULATORY_CARE_PROVIDER_SITE_OTHER): Payer: 59 | Admitting: Internal Medicine

## 2015-05-01 ENCOUNTER — Encounter: Payer: Self-pay | Admitting: Internal Medicine

## 2015-05-01 VITALS — BP 104/60 | HR 71 | Temp 97.4°F | Resp 12 | Wt 195.0 lb

## 2015-05-01 DIAGNOSIS — E1165 Type 2 diabetes mellitus with hyperglycemia: Secondary | ICD-10-CM | POA: Diagnosis not present

## 2015-05-01 DIAGNOSIS — Z794 Long term (current) use of insulin: Secondary | ICD-10-CM

## 2015-05-01 NOTE — Patient Instructions (Signed)
Please change the pump settings: - basal rates: 12 am: 0.750 10 am: 1.1  10 pm: 1.1 >> 1.0 - ICR:  6 am: 8.5 >> 8  10 am: 8.5 - target: 100-120 >> 110- 110 - ISF: 35 >> 40 - Insulin on Board: 4h - bolus wizard: on   Please use a temporary basal rate of 70% for 4h after evening exercise.  For daytime exercise, you may not need this, but may need a snack (15g of carbs) before exercise.

## 2015-05-01 NOTE — Progress Notes (Signed)
Patient ID: Nathaniel [name], male   DOB: [birth year], 50 y.o.   MRN: [id number]  HPI: Nathaniel [name] is a 50 y.o.-year-old man, returning for f/u for DM2, dx 2001, insulin-dependent since 2010, uncontrolled, without complications. Last visit 2 mo ago.  He is now back on a pump: Medtronic 670G.  He was in the ED with dysuria 03/28/2015.  His fatigue is much better after he started exercise: cardio + weight lifting - 1h 4x a week.  Last hemoglobin A1c was: Lab Results  Component Value Date   HGBA1C 9.3 02/27/2015   HGBA1C 7.7* 09/26/2014   HGBA1C 10.4* 05/16/2014  11/08/2013: 9.5% 04/11/2013: 9.8%  Pt was on a regimen of: - Metformin 1000 mg 2x a day - Lantus 14 units in am and 10 units in hs  - Humalog insulin to carb ratio: 1:10. Eats <30 g carbs per meal. Takes 2-6 units per meal, usually 2-3 units. - target sugar 130 - insulin sensitivity factor 30 - 130-160: + 1 unit  - 161-190: + 2 units  - 191-220: + 3 units  - 221-250: + 4 units  - >251: + 5 units  He is now back on a pump: Pump settings: - basal rates: 12 am: 1.1 >> 0.750 10 am: 1.1  - ICR: 9 >> 8.5  - target: 100-120 - ISF: 35 - Insulin on Board: 4h - bolus wizard: on TDD from basal insulin: 59% (22.7 units) TDD from bolus insulin: 41% (15.7 units) - changes infusion site: q3 days - Meter: Bayer Contour  Pt checks his sugars 5-10x a day and they are better: ave 139 +/- 57:  -- am:72-259 >> 111 >> 95-224 (usually high am CBGs if lows at night - 50s) >> 71-133 >> 68-232, 266 - 2h after b'fast: 86-170, 338 >> 216, 133 >> 164-214, 285 (after potluck) >> 67x1, 126-193, 245, 299 today >> 87-256 - before lunch: 97-290, most <200 >> 197-217 >> 180-250s >> 97-390 >> 91 >> 119-202 >> 153-197 >> 90-214 - 2h after lunch: 86-302 >> 122-236 >> 106-503 >> 122-173 >> 83-302 >> 72 >> 119-256 >> 140-269 >> 124-213 - before dinner: 67-260 >> 75-303 >> 98-242, 335 >> 130-342 >> 145 >> 143-189 >> 113-237 >> 56, 63-160, 228 - 2h  after dinner: n/c >> 101-337 >> 66, 102-197 >> 159-342 >> 149 >> 141-203 >> 126-379 >> 127-219 - bedtime: 160-180 >> n/c >> 357 >> see above >> 116-224 >> see above -  Nighttime: 43-83 Lows: 67x1 >> 43, 48, 53  Pt's meals are: - Breakfast: cereal + 1% milk, with protein bar - Lunch: sandwich - Dinner: eating out or eating in - Snacks: 1-2 a day  - no CKD, last BUN/creatinine:  Lab Results  Component Value Date   BUN 9 06/06/2014   CREATININE 0.98 06/06/2014  He is on Lisinopril. - last set of lipids: Lab Results  Component Value Date   CHOL 164 06/06/2014   HDL 55.10 06/06/2014   LDLCALC 85 06/06/2014   TRIG 122.0 06/06/2014   CHOLHDL 3 06/06/2014  - last eye exam was 04/07/2015. No DR.  - no numbness and tingling in his feet.   I reviewed pt's medications, allergies, PMH, social hx, family hx, and changes were documented in the history of present illness. Otherwise, unchanged from my initial visit note. Started Flomax.  ROS: Constitutional: no weight gain/loss, no fatigue, no subjective hyperthermia/hypothermia Eyes: no blurry vision, no xerophthalmia ENT: no sore throat, no nodules palpated in throat,  no dysphagia/odynophagia, no hoarseness Cardiovascular: no CP/SOB/palpitations/leg swelling Respiratory: no cough/no SOB Gastrointestinal: no N/V/D/C Musculoskeletal: no cle aches/no joint aches Skin: no rashes Neurological: no tremors/numbness/tingling/dizziness  PE: BP 104/60 mmHg  Pulse 71  Temp(Src) 97.4 F (36.3 C) (Oral)  Resp 12  Wt 195 lb (88.451 kg)  SpO2 98% Body mass index is 26.44 kg/(m^2).  Wt Readings from Last 3 Encounters:  05/01/15 195 lb (88.451 kg)  03/30/15 196 lb (88.905 kg)  03/13/15 189 lb 3.2 oz (85.821 kg)   Constitutional: normal weight, in NAD, as usual, appears very depressed Eyes: PERRLA, EOMI, no exophthalmos ENT: moist mucous membranes, no thyromegaly, no cervical lymphadenopathy Cardiovascular: RRR, No MRG Respiratory: CTA  B Gastrointestinal: abdomen soft, NT, ND, BS+ Musculoskeletal: no deformities, strength intact in all 4 Skin: moist, warm, no rashes Neurological: no tremor with outstretched hands, DTR normal in all 4  ASSESSMENT: 1. DM2, insulin-dependent, uncontrolled, without complications - Received records with C-peptide of 3.67 (0.8-3.9), from 06/16/2005 ([facility]). No associated glucose reported. - son with DM1 (not on a pump)  PLAN:  1. Patient with long-standing, uncontrolled diabetes. He is now back on an insulin pump - started ~2 mo ago.  Sugars are much improved, with not a lot of variability. He is doing a great job changing his infusion site every 3 days, documenting all sugars and  amounts of carbs in the pump, bolusing every time he eats,  And also he started exercise 4 times a week , a combination of cardio plus resistance exercises.  Overall, he is doing much better! -  He does have lower blood sugars around 12 AM, which can wake him up and they can be as low as 43. He noticed that these are mostly happening when he exercises (especially since he exercises around 8 PM after he gets out of work). We will decrease his basal rate from 10 PM to 12 AM , and I also advised him to use a temporary basal rate of 70% for 4 hours after exercise.  If he exercises during the day, we discussed that he may not need to decrease the basal rate, but he may need a 15 g carb snack before exercise. -  Sugars in a.m. Are higher  If he has a low blood sugar at night >>  Please see changes above to reduce the incidence of hypoglycemia overnight, which I hope will improve the sugars in the morning -  He can also have low blood sugars before dinner , and usually these are a consequence of correcting higher blood sugars earlier in the day. He does have higher blood sugars after breakfast up until lunch, despite using insulin if his snacks during this period. I will decrease his insulin to carb ratio with breakfast so that  his sugars do not increase as much after this meal, but will also increase his insulin sensitivity factor to allow him not to drop so low  with correction of high sugars. -  I also suggested that he changes his target to 110-110 -  We will continue metformin for now - I advised him to: Patient Instructions  Please change the pump settings: - basal rates: 12 am: 0.750 10 am: 1.1  10 pm: 1.1 >> 1.0 - ICR:  6 am: 8.5 >> 8  10 am: 8.5 - target: 100-120 >> 110- 110 - ISF: 35 >> 40 - Insulin on Board: 4h - bolus wizard: on   Please use a temporary basal rate of 70% for  4h after evening exercise.  For daytime exercise, you may not need this, but may need a snack (15g of carbs) before exercise.   - continue checking sugars at different times of the day - check 4 times a day, rotating checks - UTD with yearly eye exams - will check HbA1c at next visit;  Last hemoglobin A1c level was reviewed along with the patient, 9.3%, however, he had only started insulin pump 1 week prior to the measurement He will come in 1 mo to have another HbA1c. - Return to clinic in 1 mo with sugar log   - time spent with the patient: 40 min, of which >50% was spent in reviewing his pump downloads, discussing his hypo- and hyper-glycemic episodes, reviewing previous labs and insulin dosing a plan to avoid hypo- and hyper-glycemia.

## 2015-05-04 ENCOUNTER — Encounter: Payer: Self-pay | Admitting: Internal Medicine

## 2015-05-04 DIAGNOSIS — E119 Type 2 diabetes mellitus without complications: Secondary | ICD-10-CM | POA: Diagnosis not present

## 2015-05-04 DIAGNOSIS — E1165 Type 2 diabetes mellitus with hyperglycemia: Secondary | ICD-10-CM | POA: Diagnosis not present

## 2015-05-05 ENCOUNTER — Other Ambulatory Visit: Payer: Self-pay | Admitting: *Deleted

## 2015-05-05 DIAGNOSIS — F341 Dysthymic disorder: Secondary | ICD-10-CM | POA: Diagnosis not present

## 2015-05-05 MED ORDER — GLUCAGON (RDNA) 1 MG IJ KIT: 1.0000 mg | PACK | Freq: Once | INTRAMUSCULAR | Status: DC | PRN

## 2015-05-05 MED FILL — GLUCAGON 1 MG EMERGENCY KIT: 1 | 1 days supply | Qty: 1 | Fill #0

## 2015-05-05 NOTE — Telephone Encounter (Signed)
Pt requested a new glucagon kit.

## 2015-05-11 MED FILL — metFORMIN HCL 1000 MG TABS: 1000 | 30 days supply | Qty: 60 | Fill #1

## 2015-05-14 MED FILL — CONTOUR NEXT STRIPS: 75 days supply | Qty: 300 | Fill #1

## 2015-05-14 MED FILL — HumaLOG 100 UNIT/ML SOLN: 100 | 30 days supply | Qty: 20 | Fill #1

## 2015-05-15 ENCOUNTER — Ambulatory Visit (INDEPENDENT_AMBULATORY_CARE_PROVIDER_SITE_OTHER): Payer: 59 | Admitting: Family Medicine

## 2015-05-15 ENCOUNTER — Encounter: Payer: Self-pay | Admitting: Family Medicine

## 2015-05-15 VITALS — BP 127/73 | HR 69 | Wt 195.0 lb

## 2015-05-15 DIAGNOSIS — F329 Major depressive disorder, single episode, unspecified: Secondary | ICD-10-CM | POA: Diagnosis not present

## 2015-05-15 DIAGNOSIS — F32A Depression, unspecified: Secondary | ICD-10-CM

## 2015-05-15 DIAGNOSIS — F341 Dysthymic disorder: Secondary | ICD-10-CM | POA: Diagnosis not present

## 2015-05-15 DIAGNOSIS — F325 Major depressive disorder, single episode, in full remission: Secondary | ICD-10-CM | POA: Insufficient documentation

## 2015-05-15 MED ORDER — ESCITALOPRAM OXALATE 20 MG PO TABS: 20.0000 mg | ORAL_TABLET | Freq: Every day | ORAL | Status: DC

## 2015-05-15 NOTE — Progress Notes (Signed)
CC: Nathaniel [name] is a 50 y.o. male is here for Medication Management   Subjective: HPI:  Feeling gloomy for the past few months off and on but for most days of the week.  His job is going well and he doesn't know why he feels down. Denies thoughts of harm.  He began seeing a therapist a few weeks ago but after 4-5 sessions he's not having any improvement. He once again restart Lexapro, a regimen of 20 mg at least been taking in the past. He stopped taking this one half years ago because he wanted to test to see if he still needed. He denies any anxiety or other mental disturbance. He is going to start EMDR his therapist next week. No unintentional weight loss or gain. No appetite changes  Review Of Systems Outlined In HPI  Past Medical History  Diagnosis Date  . Depression   . Hypogonadism male   . Headache(784.0)   . Diabetes mellitus     type II  . GERD (gastroesophageal reflux disease)     Past Surgical History  Procedure Laterality Date  . Tonsillectomy  1972  . Nasal sinus surgery  04/1991   Family History  Problem Relation Age of Onset  . Adopted: Yes  . Cancer Father 20    Prostate cancer  . Healthy Mother   . Diabetes Mellitus I Son     Social History   Social History  . Marital Status: Married    Spouse Name: Nathaniel [name]  . Number of Children: 2  . Years of Education: N/A   Occupational History  .      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 Smoking status: Never Smoker   . Smokeless tobacco: Never Used  . Alcohol Use: No  . Drug Use: No  . Sexual Activity: Not on file   Other Topics Concern  . Not on file   Social History Narrative   He lives with wife and two children.   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   Highest level of education: master degree     Objective: BP 127/73 mmHg  Pulse 69  Wt 195 lb (88.451 kg)  Vital signs reviewed. General: Alert and Oriented, No Acute  Distress HEENT: Pupils equal, round, reactive to light. Conjunctivae clear.  External ears unremarkable.  Moist mucous membranes. Lungs: Clear and comfortable work of breathing, speaking in full sentences without accessory muscle use. Cardiac: Regular rate and rhythm.  Neuro: CN II-XII grossly intact, gait normal. Extremities: No peripheral edema.  Strong peripheral pulses.  Mental Status: Mild depression. No anxiety, nor agitation. Logical though process. Skin: Warm and dry.  Assessment & Plan: Nathaniel [name] was seen today for medication management.  Diagnoses and all orders for this visit:  Depression -     escitalopram (LEXAPRO) 20 MG tablet; Take 1 tablet (20 mg total) by mouth daily.   Depression: Restarting Lexapro, begin 10 mg daily for 5-7 days then increase to a full 20 mg. Follow-up in one month if not improving.  Return in about 4 weeks (around 06/12/2015) for mood.

## 2015-05-22 DIAGNOSIS — F341 Dysthymic disorder: Secondary | ICD-10-CM | POA: Diagnosis not present

## 2015-05-29 ENCOUNTER — Other Ambulatory Visit (INDEPENDENT_AMBULATORY_CARE_PROVIDER_SITE_OTHER): Payer: 59

## 2015-05-29 ENCOUNTER — Other Ambulatory Visit: Payer: 59

## 2015-05-29 DIAGNOSIS — Z794 Long term (current) use of insulin: Secondary | ICD-10-CM

## 2015-05-29 DIAGNOSIS — E1165 Type 2 diabetes mellitus with hyperglycemia: Secondary | ICD-10-CM | POA: Diagnosis not present

## 2015-05-29 LAB — HEMOGLOBIN A1C: Hgb A1c MFr Bld: 7.6 % — ABNORMAL HIGH (ref 4.6–6.5)

## 2015-06-17 MED FILL — HumaLOG 100 UNIT/ML SOLN: 100 | 30 days supply | Qty: 20 | Fill #2

## 2015-06-19 ENCOUNTER — Ambulatory Visit: Payer: Self-pay | Admitting: Pharmacist

## 2015-06-19 ENCOUNTER — Ambulatory Visit (INDEPENDENT_AMBULATORY_CARE_PROVIDER_SITE_OTHER): Payer: Self-pay | Admitting: Family Medicine

## 2015-06-19 ENCOUNTER — Encounter: Payer: Self-pay | Admitting: Pharmacist

## 2015-06-19 VITALS — BP 95/70 | Ht 72.0 in | Wt 197.6 lb

## 2015-06-19 DIAGNOSIS — Z794 Long term (current) use of insulin: Secondary | ICD-10-CM

## 2015-06-19 DIAGNOSIS — E1165 Type 2 diabetes mellitus with hyperglycemia: Secondary | ICD-10-CM

## 2015-06-19 NOTE — Progress Notes (Signed)
Subjective:  Patient presents today for 3 month diabetes follow-up as part of the employer-sponsored Link to Wellness program.  Current diabetes regimen includes Humalog via insulin pump. Patient also continues on daily ACE Inhibitor.  Most recent MD follow-up was with Dr. Elvera LennoxGherghe in February. Most recent A1C was  7.6% (decreased from 9) Patient has a pending appt for May with Dr. Elvera LennoxGherghe. No med changes or major health changes at this time.   He states that he has changed infusion sets to the [place] and that has made a difference in comfort and tolerability.   Patient states he has stopped taking Lexapro because it was not helping. I will let Dr. Ivan AnchorsHommel know.   Assessment:  Diabetes: Most recent A1C was 7.6 % which is exceeding goal of less than 7%. Weight is stable/increased/decreased from last visit with me.    CBG Review: Checking 4-5 times daily- always checking fasting and before he eats.   CBG average 14 day- 167 mg/dL. High/Low- 296/100  I was unable to download either patient's pump or meter to [facility].   Patient denies hypoglycemia (he was having issues with this last time).   He states that he is sometimes forgetting metformin dose in the evenings with his later work schedule and this is causing morning fasting CBGs to be higher.   Fasting CBGs are running in the 150-200s.  Lifestyle improvements:  Physical Activity-  The last three weeks have been abnormal and he hasn't been going as much. He has been working later hours and hasn't been able to get in before the gym closes.  Typically 30-40 mintues cardio activity (treadmill/elliptical) four times a week. Also doing some weight training.   His new job he is starting in the next few weeks is a lot more flexible and he states that he will be better able to incorporate physical activity into his schedule.   Nutrition-  He reports no major changes. Still drinking diet sodas.   He states that he has gotten better  about carbohydrate counting and he is reading food labels and calculating carbohydrate amounts.   Follow up with me in 3 months.    Plan/Goals for Next Visit:  1. Bring some metformin to keep at work so that you can remember to take your second dose.  2. Continue physical activity four times a week for 30-40 minutes.     Next appointment to see me is: Friday June 30th at 8 AM.    Ellender HoseMegan D. Vivia EwingWheatley, PharmD, BCPS, CDE [facility] Clinical Pharmacist & Diabetes Care Coordinator [phone]

## 2015-06-26 ENCOUNTER — Ambulatory Visit: Payer: 59 | Admitting: Family Medicine

## 2015-06-28 ENCOUNTER — Encounter: Payer: Self-pay | Admitting: Family Medicine

## 2015-06-29 ENCOUNTER — Other Ambulatory Visit: Payer: Self-pay | Admitting: Family Medicine

## 2015-07-02 MED FILL — metFORMIN HCL 1000 MG TABS: 1000 | 30 days supply | Qty: 60 | Fill #2

## 2015-07-08 ENCOUNTER — Encounter: Payer: Self-pay | Admitting: Internal Medicine

## 2015-07-09 ENCOUNTER — Encounter: Payer: Self-pay | Admitting: Internal Medicine

## 2015-07-15 ENCOUNTER — Encounter: Payer: 59 | Admitting: Nutrition

## 2015-07-17 DIAGNOSIS — F341 Dysthymic disorder: Secondary | ICD-10-CM | POA: Diagnosis not present

## 2015-07-21 ENCOUNTER — Other Ambulatory Visit: Payer: Self-pay | Admitting: Internal Medicine

## 2015-07-21 MED FILL — HumaLOG 100 UNIT/ML SOLN: 100 | 34 days supply | Qty: 20 | Fill #0

## 2015-07-27 ENCOUNTER — Encounter: Payer: Self-pay | Admitting: Internal Medicine

## 2015-07-28 ENCOUNTER — Other Ambulatory Visit: Payer: Self-pay | Admitting: Internal Medicine

## 2015-07-28 DIAGNOSIS — IMO0001 Reserved for inherently not codable concepts without codable children: Secondary | ICD-10-CM

## 2015-07-28 DIAGNOSIS — Z794 Long term (current) use of insulin: Principal | ICD-10-CM

## 2015-07-28 DIAGNOSIS — E1165 Type 2 diabetes mellitus with hyperglycemia: Principal | ICD-10-CM

## 2015-07-31 ENCOUNTER — Ambulatory Visit (INDEPENDENT_AMBULATORY_CARE_PROVIDER_SITE_OTHER): Payer: 59 | Admitting: Internal Medicine

## 2015-07-31 ENCOUNTER — Encounter: Payer: Self-pay | Admitting: Internal Medicine

## 2015-07-31 VITALS — BP 110/60 | HR 69 | Temp 97.4°F | Resp 12 | Wt 200.0 lb

## 2015-07-31 DIAGNOSIS — E1165 Type 2 diabetes mellitus with hyperglycemia: Secondary | ICD-10-CM

## 2015-07-31 DIAGNOSIS — Z794 Long term (current) use of insulin: Secondary | ICD-10-CM

## 2015-07-31 DIAGNOSIS — F341 Dysthymic disorder: Secondary | ICD-10-CM | POA: Diagnosis not present

## 2015-07-31 DIAGNOSIS — IMO0001 Reserved for inherently not codable concepts without codable children: Secondary | ICD-10-CM

## 2015-07-31 MED ORDER — METFORMIN HCL 1000 MG PO TABS
ORAL_TABLET | ORAL | Status: DC
Start: 2015-07-31 — End: 2016-05-23

## 2015-07-31 MED ORDER — INSULIN LISPRO 100 UNIT/ML (KWIKPEN): 6.0000 [IU] | PEN_INJECTOR | Freq: Three times a day (TID) | SUBCUTANEOUS | Status: DC

## 2015-07-31 MED ORDER — INSULIN GLARGINE 100 UNIT/ML SOLOSTAR PEN: PEN_INJECTOR | SUBCUTANEOUS | Status: DC

## 2015-07-31 MED FILL — LANTUS SOLOSTAR 100 UNITS/M: 100 | 47 days supply | Qty: 15 | Fill #0

## 2015-07-31 MED FILL — metFORMIN HCL 1000 MG TABS: 1000 | 90 days supply | Qty: 180 | Fill #0

## 2015-07-31 MED FILL — HUMALOG 100 UNITS/ML KWIKPE: 100 | 50 days supply | Qty: 15 | Fill #0

## 2015-07-31 NOTE — Patient Instructions (Signed)
Please continue: - Metformin 1000 mg 2x a day - Lantus 16 units 2x a day  Change Humalog as follows: - Insulin to carb ratio: 1:7 - target: 120 - Insulin sensitivity factor: 30  Please return in 1.5-2 months with your sugar log.

## 2015-07-31 NOTE — Progress Notes (Signed)
Patient ID: Nathaniel [name], male   DOB: Mar 27, 1965, 50 y.o.   MRN: [id number]  HPI: Nathaniel [name] is a 50 y.o.-year-old man, returning for f/u for DM2, dx 2001, insulin-dependent since 2010, uncontrolled, without complications. Last visit 3 mo ago.  He was  back on a pump at last visit: Medtronic 630G >> now off if he decided that he does not want the pump anymore.  He started a new job 2 weeks ago. He feels happier with this job, although he is traveling more.  Last hemoglobin A1c was: Lab Results  Component Value Date   HGBA1C 7.6* 05/29/2015   HGBA1C 9.3 02/27/2015   HGBA1C 7.7* 09/26/2014  11/08/2013: 9.5% 04/11/2013: 9.8%  Pt was on a regimen of: - basal rates: 12 am: 0.750 10 am: 1.1  10 pm: 1.1 >> 1.0 - ICR:  6 am: 8.5 >> 8  10 am: 8.5 - target: 100-120 >> 110- 110 - ISF: 35 >> 40 - Insulin on Board: 4h - bolus wizard: on Please use a temporary basal rate of 70% for 4h after evening exercise. For daytime exercise, you may not need this, but may need a snack (15g of carbs) before exercise.  Now on: - Metformin 1000 mg 2x a day - Lantus 16 units in am and 16 units in hs  - Humalog insulin to carb ratio: 1:12. However, he ends up taking 6-8 units per meal. - target sugar 120-130 - insulin sensitivity factor 30 - 130-160: + 1 unit  - 161-190: + 2 units  - 191-220: + 3 units  - 221-250: + 4 units  - >251: + 5 units  Pt checks his sugars 4-6x a day and they are Higher: - am:111 >> 95-224 (usually high am CBGs if lows at night - 50s) >> 71-133 >> 68-232, 266 >> 101-204, 221, 243 - 2h after b'fast: 216, 133 >> 164-214, 285 (after potluck) >> 67x1, 126-193, 245, 299 today >> 87-256 >> 194-272 - before lunch: 197-217 >> 180-250s >> 97-390 >> 91 >> 119-202 >> 153-197 >> 90-214 >> 128 -225 - 2h after lunch: 122-236 >> 106-503 >> 122-173 >> 83-302 >> 72 >> 119-256 >> 140-269 >> 124-213 >> 162-263, 287 - before dinner: 75-303 >> 98-242, 335 >> 130-342 >> 145 >> 143-189 >>  113-237 >> 56, 63-160, 228 >> 129-251 - 2h after dinner: n/c >> 101-337 >> 66, 102-197 >> 159-342 >> 149 >> 141-203 >> 126-379 >> 127-219 >> 83-254, 346 - bedtime: 160-180 >> n/c >> 357 >> see above >> 116-224 >> see above -  Nighttime: 43-83 >> 168-277 Lowest: 67x1 >> 43, 48, 53 >> 101. Highest: 370  Pt's meals are: - Breakfast: cereal + 1% milk, with protein bar - Lunch: sandwich - Dinner: eating out or eating in - Snacks: 1-2 a day  - no CKD, last BUN/creatinine:  Lab Results  Component Value Date   BUN 9 06/06/2014   CREATININE 0.98 06/06/2014  He is on Lisinopril. - last set of lipids: Lab Results  Component Value Date   CHOL 164 06/06/2014   HDL 55.10 06/06/2014   LDLCALC 85 06/06/2014   TRIG 122.0 06/06/2014   CHOLHDL 3 06/06/2014  - last eye exam was 04/07/2015. No DR.  - no numbness and tingling in his feet.   I reviewed pt's medications, allergies, PMH, social hx, family hx, and changes were documented in the history of present illness. Otherwise, unchanged from my initial visit note. Started Flomax.  ROS: Constitutional:  no weight gain/loss,+ fatigue, no subjective hyperthermia/hypothermia Eyes: no blurry vision, no xerophthalmia ENT: no sore throat, no nodules palpated in throat, no dysphagia/odynophagia, no hoarseness Cardiovascular: no CP/SOB/palpitations/leg swelling Respiratory: no cough/no SOB Gastrointestinal: no N/V/D/C Musculoskeletal: no muscle aches/no joint aches Skin: no rashes Neurological: no tremors/numbness/tingling/dizziness, + HA  PE: BP 110/60 mmHg  Pulse 69  Temp(Src) 97.4 F (36.3 C) (Oral)  Resp 12  Wt 200 lb (90.719 kg)  SpO2 99% Body mass index is 27.12 kg/(m^2).  Wt Readings from Last 3 Encounters:  07/31/15 200 lb (90.719 kg)  06/19/15 197 lb 9.6 oz (89.631 kg)  05/15/15 195 lb (88.451 kg)   Constitutional: normal weight, in NAD Eyes: PERRLA, EOMI, no exophthalmos ENT: moist mucous membranes, no thyromegaly, no cervical  lymphadenopathy Cardiovascular: RRR, No MRG Respiratory: CTA B Gastrointestinal: abdomen soft, NT, ND, BS+ Musculoskeletal: no deformities, strength intact in all 4 Skin: moist, warm, no rashes Neurological: no tremor with outstretched hands, DTR normal in all 4  ASSESSMENT: 1. DM2, insulin-dependent, uncontrolled, without complications - Received records with C-peptide of 3.67 (0.8-3.9), from 06/16/2005 ([facility]). No associated glucose reported. - son with DM1 (not on a pump)  PLAN:  1. Patient with long-standing, uncontrolled diabetes. He was doing much better on an insulin pump, however, he decided that that he would want to switch back to a basal-bolus regimen, which she started 2 weeks ago. Sugars are higher, following the staircase pattern of increasing sugars as the day goes by, a sign of not enough mealtime insulin. Will decrease his insulin to carb ratio and his target. We'll keep Lantus at the same dose. -  We will continue metformin for now - I advised him to: Patient Instructions  Please continue: - Metformin 1000 mg 2x a day - Lantus 16 units 2x a day  Change Humalog as follows: - Insulin to carb ratio: 1:7 - target: 120 - Insulin sensitivity factor: 30  Please return in 1.5-2 months with your sugar log.   - continue checking sugars at different times of the day - check 4 times a day, rotating checks - UTD with yearly eye exams - will check HbA1c at next visit - for now, check CMP and Lipids - Return to clinic in 1.5-2 mo with sugar log   - time spent with the patient: 40 min, of which >50% was spent in reviewing his pump downloads, discussing his hypo- and hyper-glycemic episodes, reviewing previous labs and insulin dosing a plan to avoid hypo- and hyper-glycemia.   Addendum: Patient did not stop at the lab. Will check them next time.

## 2015-08-05 ENCOUNTER — Other Ambulatory Visit: Payer: Self-pay | Admitting: Internal Medicine

## 2015-08-05 ENCOUNTER — Other Ambulatory Visit: Payer: Self-pay | Admitting: Physician Assistant

## 2015-08-05 MED FILL — LISINOPRIL 2.5 MG TABLET: 2.5 | 90 days supply | Qty: 90 | Fill #0

## 2015-08-05 MED FILL — CONTOUR NEXT STRIPS: 75 days supply | Qty: 300 | Fill #0

## 2015-08-14 ENCOUNTER — Other Ambulatory Visit: Payer: 59

## 2015-08-14 DIAGNOSIS — Z794 Long term (current) use of insulin: Secondary | ICD-10-CM | POA: Diagnosis not present

## 2015-08-14 DIAGNOSIS — E119 Type 2 diabetes mellitus without complications: Secondary | ICD-10-CM | POA: Diagnosis not present

## 2015-08-14 DIAGNOSIS — E1165 Type 2 diabetes mellitus with hyperglycemia: Secondary | ICD-10-CM | POA: Diagnosis not present

## 2015-08-14 LAB — LIPID PANEL
Cholesterol: 180 mg/dL (ref 0–200)
HDL: 47.4 mg/dL (ref >39.00)
LDL Cholesterol: 105 mg/dL — ABNORMAL HIGH (ref 0–99)
NonHDL: 132.22
Total CHOL/HDL Ratio: 4
Triglycerides: 134 mg/dL (ref 0.0–149.0)
VLDL: 26.8 mg/dL (ref 0.0–40.0)

## 2015-08-14 LAB — COMPLETE METABOLIC PANEL WITH GFR
ALT: 18 U/L (ref 9–46)
AST: 20 U/L (ref 10–40)
Albumin: 4.4 g/dL (ref 3.6–5.1)
Alkaline Phosphatase: 55 U/L (ref 40–115)
BUN: 12 mg/dL (ref 7–25)
CO2: 27 mmol/L (ref 20–31)
Calcium: 9.4 mg/dL (ref 8.6–10.3)
Chloride: 102 mmol/L (ref 98–110)
Creat: 1.01 mg/dL (ref 0.60–1.35)
GFR, Est African American: >89 mL/min (ref >=60)
GFR, Est Non African American: 87 mL/min (ref >=60)
Glucose, Bld: 85 mg/dL (ref 65–99)
Potassium: 4 mmol/L (ref 3.5–5.3)
Sodium: 139 mmol/L (ref 135–146)
Total Bilirubin: 0.7 mg/dL (ref 0.2–1.2)
Total Protein: 6.7 g/dL (ref 6.1–8.1)

## 2015-08-21 ENCOUNTER — Encounter: Payer: 59 | Admitting: Dietician

## 2015-08-25 ENCOUNTER — Encounter: Payer: Self-pay | Admitting: Family Medicine

## 2015-08-27 ENCOUNTER — Encounter: Payer: Self-pay | Admitting: Family Medicine

## 2015-08-27 ENCOUNTER — Ambulatory Visit (INDEPENDENT_AMBULATORY_CARE_PROVIDER_SITE_OTHER): Payer: 59 | Admitting: Family Medicine

## 2015-08-27 VITALS — BP 114/78 | HR 98 | Wt 201.0 lb

## 2015-08-27 DIAGNOSIS — M5412 Radiculopathy, cervical region: Secondary | ICD-10-CM

## 2015-08-27 NOTE — Progress Notes (Signed)
Subjective:    I'm seeing this patient as a consultation for:  Dr Ivan AnchorsHommel  CC: Right arm pain and numbness  HPI: Patient notes a long history of right arm pain numbness and some weakness. He was formally diagnosed with cervical radiculopathy in December 2014 with an MRI. He received epidural steroid injections and physical therapy which helped a lot. Over the last several months he's had worsening numbness and pain especially into the right lateral upper arm. Symptoms are not typically very bad at rest but becomes very bad with exercise. He is unable to exercise normally because of persistent pain and numbness. He denies any current weakness bowel bladder dysfunction or difficulty walking. No fevers or chills.  Past medical history, Surgical history, Family history not pertinant except as noted below, Social history, Allergies, and medications have been entered into the medical record, reviewed, and no changes needed.   Review of Systems: No headache, visual changes, nausea, vomiting, diarrhea, constipation, dizziness, abdominal pain, skin rash, fevers, chills, night sweats, weight loss, swollen lymph nodes, body aches, joint swelling, muscle aches, chest pain, shortness of breath, mood changes, visual or auditory hallucinations.   Objective:    Filed Vitals:   08/27/15 1320  BP: 114/78  Pulse: 98   General: Well Developed, well nourished, and in no acute distress.  Neuro/Psych: Alert and oriented x3, extra-ocular muscles intact, able to move all 4 extremities, sensation grossly intact. Skin: Warm and dry, no rashes noted.  Respiratory: Not using accessory muscles, speaking in full sentences, trachea midline.  Cardiovascular: Pulses palpable, no extremity edema. Abdomen: Does not appear distended. MSK: Nontender to midline normal neck motion. Negative Spurling's test. Upper extremity strength is equal and normal throughout. Reflexes and sensation are intact throughout. Right Shoulder  normal-appearing nontender normal motion negative impingement testing normal strength. Stable exam. Pulses capillary refill sensation intact throughout bilateral upper extremities  MRI C-spine dated 03/09/2013  CLINICAL DATA: Severe neck pain for 3 weeks with right arm pain, numbness and weakness. No previous relevant surgery or acute injury.  EXAM: MRI CERVICAL SPINE WITHOUT CONTRAST  TECHNIQUE: Multiplanar, multisequence MR imaging was performed. No intravenous contrast was administered.  COMPARISON: None.  FINDINGS: Examination is mildly motion degraded. There is reversal of the usual cervical lordosis inferiorly with a grade 1 anterolisthesis at C4-5. There is no evidence of acute fracture or paraspinous ligamentous injury.  The craniocervical junction appears normal. The cervical cord is normal in signal and caliber. There are bilateral vertebral artery flow voids.  C2-3: Mild uncinate spurring and facet hypertrophy. No significant spinal stenosis or nerve root encroachment.  C3-4: Uncinate spurring and facet hypertrophy contribute to mild biforaminal stenosis. There is no cord deformity.  C4-5: There is disc bulging and mild bilateral facet hypertrophy contributing to mild biforaminal stenosis. No cord deformity.  C5-6: The axial images through this level are motion degraded. There is a broad-based disc protrusion with an asymmetric left paracentral component. This mildly indents the ventral surface of the cord. In addition, there is a probable medial foraminal component on the right, best seen on the sagittal images. This may contribute to right C6 nerve root encroachment. The left foramen of also appears mildly narrowed.  C6-7: No significant findings.  C7-T1: No significant findings.  IMPRESSION: 1. The primary abnormality is at C5-6 where there is a broad-based disc protrusion with apparent right foraminal and left paracentral components.  Detail is limited by motion on the axial images, although there is evidence of mild cord  flattening and right greater than left foraminal stenosis. 2. Uncinate spurring and facet hypertrophy contribute to mild foraminal narrowing at C3-4 and C4-5. No other focal disc protrusion or cord deformity identified.   Electronically Signed  By: Roxy Horseman M.D.  On: 03/09/2013 12:32  No results found for this or any previous visit (from the past 24 hour(s)). No results found.  Impression and Recommendations:   Right arm numbness and pain. Very likely C5 radiculopathy based on location of symptoms and MRI from December 2014. His symptoms are moderately bothersome mostly with exercise and I rest I feel that a trial of physical therapy is warranted. If not better think a repeat MRI is reasonable. Recheck in a few weeks.  This case required medical decision making of moderate complexity.

## 2015-08-27 NOTE — Patient Instructions (Signed)
Thank you for coming in today. Attend PT.  Return in 4 weeks.  Come back or go to the emergency room if you notice new weakness new numbness problems walking or bowel or bladder problems.

## 2015-09-01 ENCOUNTER — Ambulatory Visit: Payer: 59 | Attending: Family Medicine | Admitting: Physical Therapy

## 2015-09-01 DIAGNOSIS — R293 Abnormal posture: Secondary | ICD-10-CM | POA: Diagnosis not present

## 2015-09-01 DIAGNOSIS — M5412 Radiculopathy, cervical region: Secondary | ICD-10-CM | POA: Diagnosis not present

## 2015-09-01 NOTE — Therapy (Signed)
[facility] [address]  [address] [place], [place], [address] Phone: [phone]   Fax:  [phone]  Physical Therapy Evaluation  Patient Details  Name: Nathaniel [name] MRN: [id number] Date of Birth: August 30, 1965 Referring Provider: Rodolph Bong, MD  Encounter Date: 09/01/2015      PT End of Session - 09/01/15 1019    Visit Number 1   Number of Visits 12   Date for PT Re-Evaluation 10/20/15   PT Start Time 0932   PT Stop Time 1017   PT Time Calculation (min) 45 min   Activity Tolerance Patient tolerated treatment well   Behavior During Therapy Flat affect      Past Medical History  Diagnosis Date  . Depression   . Hypogonadism male   . Headache(784.0)   . Diabetes mellitus     type II  . GERD (gastroesophageal reflux disease)     Past Surgical History  Procedure Laterality Date  . Tonsillectomy  1972  . Nasal sinus surgery  04/1991    There were no vitals filed for this visit.       Subjective Assessment - 09/01/15 0935    Subjective About 2.5 yrs ago was diagnosed with bulding disc at C5-6. Some initial relief with injection at the time, but minimal reponse to therapy at the time. Radicular symptoms include numbess to mid humerus and tingling in neck, both of which are present intermittently but worsen with any attempts to exercise both cardio on treadmill and small weight lifting.   Diagnostic tests 03/09/13 - Cervical MRI:  C5-6 broad-based disc protrusion with apparent right foraminal and left paracentral components. Detail is limited by motion on the axial images, although there is evidence of mild cord flattening and right greater than left foraminal stenosis.  Uncinate spurring and facet hypertrophy contribute to mild foraminal narrowing at C3-4 and C4-5. No other focal disc protrusion or cord deformity identified.   Patient Stated Goals "want to find out what I can do w/o overdoing it, so I can work on  building up strength"   Currently in Pain? Yes   Pain Score --  Least/current 2-3/10, Avg 3-4/10, Worst 7-8/10   Pain Location Neck   Pain Descriptors / Indicators Tingling   Pain Radiating Towards radicular numbness into R UE to just above elbow (more bothersome than neck) - reflected in pain scores   Pain Onset More than a month ago   Pain Frequency Constant   Aggravating Factors  Running, lifting weights, sleeping position   Pain Relieving Factors Nothing   Effect of Pain on Daily Activities Limits ability to work-out (running & lifting weights)            [facility] PT Assessment - 09/01/15 0932    Assessment   Medical Diagnosis Cervical radiculitis   Referring Provider Rodolph Bong, MD   Onset Date/Surgical Date --  initial diagnosis of cervical HNP 2.5 yrs   Hand Dominance Left   Next MD Visit 09/28/15   Prior Therapy PT > 2 yrs ago   Prior Function   Level of Independence Independent   Vocation Full time employment   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   Leisure would like to be able to work out   Observation/Other Assessments   Focus on Therapeutic Outcomes (FOTO)  Neck 81% (19% limitation)   Posture/Postural Control   Posture/Postural Control Postural limitations   Postural Limitations Forward  head;Rounded Shoulders;Increased thoracic kyphosis   ROM / Strength   AROM / PROM / Strength AROM;Strength   AROM   AROM Assessment Site Cervical;Shoulder   Right/Left Shoulder Right;Left   Right Shoulder Flexion 133 Degrees   Right Shoulder ABduction 137 Degrees   Right Shoulder Internal Rotation --  FIR to L1   Right Shoulder External Rotation --  FER to T1   Left Shoulder Flexion 144 Degrees   Left Shoulder ABduction 136 Degrees   Left Shoulder Internal Rotation --  FIR to L1   Left Shoulder External Rotation --  FER to C7   Cervical Flexion 45   Cervical Extension 49   Cervical - Right Side Bend 36   Cervical - Left  Side Bend 39   Cervical - Right Rotation 72   Cervical - Left Rotation 68   Strength   Overall Strength Comments B shoulder strength 4/5 to 4+/5 w/o pain   Strength Assessment Site Shoulder   Right/Left Shoulder Right;Left          Today's Treatment  Postural education for neutral spine and shoulder posture  TherEx Chin tuck 10x5" B UT stretch x30" each Corner stretch x30" B Shoulder Rows (with emphasis on good posture) with blue TB 10x3" B Scapular Retraction + Shoulder Extension to neutral with blue TB 10x3"          PT Education - 09/01/15 1853    Education provided Yes   Education Details PT eval findings, POC, initial postural education & initial HEP   Person(s) Educated Patient   Methods Explanation;Demonstration;Handout   Comprehension Verbalized understanding;Returned demonstration;Need further instruction             PT Long Term Goals - 09/01/15 1015    PT LONG TERM GOAL #1   Title Pt will be independent with HEP/gym program by 10/20/15   Status New   PT LONG TERM GOAL #2   Title Pt will routinely demonstrate neutral neck and shoulder posture w/o cues by 10/20/15   Status New   PT LONG TERM GOAL #3   Title Pt will demonstrate proper body mechanics with weight lifting and report no increased pain with effort by 10/20/15   Status New               Plan - 09/01/15 1015    Clinical Impression Statement Nathaniel [name] is a 50 y/o male who presents to OP PT with neck and R upper arm radicular pain and numbness, which is exacerbated every time pt attempts to initiate a work-out routine. Pain is currently 2-3/10, 3-4/10 on average but worsens up to 7-8/10 after attempts to run or lift weights. Cervical ROM essentially WFL with no pain reported but bilateral shoulder flexion & abduction ROM mildly limited. Shoulder strength is 4/5 to 4+/5 w/o pain. No ttp but increased tension noted in B UT. Pt demonstrates significantly slouched posture with forward head,  rounded shoulder and increased postural kyphosis. Pt denies functional limitation due to pain other than ability to work-out with running or lifting weights. POC will focus on improving postural awareness including increasing muscle flexibility along with soft tissue pliability and anterior stretching, posterior strengthening, and training in good body mechanics for lifting/gym activities. May consider mechanical traction, dry needling and manual therapy / modalities PRN for pain.   Rehab Potential Good   Clinical Impairments Affecting Rehab Potential Depression, chronic fatigue, uncontrolled DM, chronic headaches   PT Frequency 2x / week  1-2x/wk per pt preference  PT Duration 6 weeks   PT Treatment/Interventions Patient/family education;Neuromuscular re-education;Therapeutic exercise;Therapeutic activities;Manual techniques;Dry needling;Taping;Electrical Stimulation;Moist Heat;Cryotherapy;Iontophoresis /ml Dexamethasone;Ultrasound;Traction;ADLs/Self Care Home Management   PT Next Visit Plan Review initial HEP; Postural training/strengthening emphasizing anterior flexibility and posterior/scapular stabilization (probable hooklying on foam roll for initial stretch/exercises); May consider mechanical traction, dry needling and manual therapy / modalities PRN for pain.   Consulted and Agree with Plan of Care Patient      Patient will benefit from skilled therapeutic intervention in order to improve the following deficits and impairments:  Pain, Impaired sensation, Postural dysfunction, Improper body mechanics, Decreased strength, Impaired UE functional use, Impaired flexibility  Visit Diagnosis: Radiculopathy, cervical region  Abnormal posture     Problem List Patient Active Problem List   Diagnosis Date Noted  . Uncontrolled type 2 diabetes mellitus without complication, with long-term current use of insulin (HCC) 07/31/2015  . Depression 05/15/2015  . Cervical radiculitis 01/25/2015   . BPH (benign prostatic hyperplasia) 09/12/2014  . Foot pain, right 09/05/2013  . Neck pain 02/27/2013  . Herpes zoster 02/22/2013  . Epididymal pain 12/19/2012  . Chronic headaches 09/28/2011  . Seasonal and perennial allergic rhinitis 09/28/2011  . HYPERLIPIDEMIA 12/16/2008  . FATIGUE, CHRONIC 12/16/2008  . HEADACHE 12/16/2008    Marry Guan, PT, MPT 09/01/2015, 6:59 PM  [facility] [address]  [address] [place], [place], [address] Phone: [phone]   Fax:  [phone]  Name: TEVAN MARIAN MRN: [id number] Date of Birth: [birth year]

## 2015-09-08 ENCOUNTER — Encounter: Payer: 59 | Attending: Internal Medicine | Admitting: Dietician

## 2015-09-08 ENCOUNTER — Encounter: Payer: Self-pay | Admitting: Dietician

## 2015-09-08 VITALS — Ht 72.0 in | Wt 200.0 lb

## 2015-09-08 DIAGNOSIS — Z794 Long term (current) use of insulin: Secondary | ICD-10-CM | POA: Insufficient documentation

## 2015-09-08 DIAGNOSIS — E118 Type 2 diabetes mellitus with unspecified complications: Secondary | ICD-10-CM

## 2015-09-08 DIAGNOSIS — E1165 Type 2 diabetes mellitus with hyperglycemia: Secondary | ICD-10-CM | POA: Diagnosis not present

## 2015-09-08 NOTE — Patient Instructions (Signed)
Consider using the Calorie Brooke DareKing app to help you become more informed about foods when eating out.  Resume an exercise habit  Glucerna or Boost Glucose Control could be snack options. Consider increasing your intake of non starchy vegetables. Recommend drinking more water.   Consider a trial of 2 weeks without diet soda to see how you feel.  You will need another source of caffeine to avoid headaches. Each snack should be a small amount of protein and you can have about 15 grams of carbohydrates with this. Consider avoiding or limiting eating for 3 hours prior to bed due to increased risk of reflux.

## 2015-09-08 NOTE — Progress Notes (Signed)
Medical Nutrition Therapy:  Appt start time: 0815 end time:  0930.   Assessment:  Primary concerns today: Patient is here today alone.  He would like more information on foods to eat on the run to avoid weight gain and snacks to prevent blood sugar drops.  He is a patient of Dr. Alinda SierrasGheghe and has had type 2 diabetes for 16 years.  His son has type 1 diabetes.  He has seen one of the other RD's in the past ([facility]) for trial of a pump.  He was last here 02/2015 and tried the pump for 6 months but did not like it due to needle marks, painful infusion sites and the inconvenience.  He now takes multiple insulin shots per day and does remember them.  He checks his blood sugar about 6 times per day.  Am 100-15 and 2 hours after meals often 200.    Weight hx: 191 lbs 6 months ago.  Gaining weight with different job duties-traveling and eating out more often.   Today's weight 200 lbs. Feels best at 180-190 lbs.  He lives with his wife and teenage son.  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     Preferred Learning Style:   No preference indicated   Learning Readiness:   Ready  MEDICATIONS: see list to include metformin, lantus 16 units bid, humalog 5-10 units with meals.  He counts carbohydrates and also adjust for a CBG>130.   DIETARY INTAKE:  Usual eating pattern includes 3 meals and 2-3 snacks per day.  24-hr recall:  B (7 AM): ham and cheese sandwich with mayo on [employer] bread OR plain cheerios with 2% milk  Snk ( AM): occasional NABS  L (12 PM): fast food Snk ( PM): chips D (5:30 PM): spaghetti or pizza or mac and cheese or other leftovers AND salad Snk ( PM): increased with increased stress or boredom:  2 blueberry muffins Beverages: diet pepsi, occasional water  Usual physical activity: Working with PT due to bulging  disk in his neck.  He has been avoiding exercise to avoid arm pain.  He can walk without pain but is not currently.  Estimated energy needs: 1800 calories 200 g carbohydrates 135 g protein 50 g fat  Progress Towards Goal(s):  In progress.   Nutritional Diagnosis:  NB-1.1 Food and nutrition-related knowledge deficit As related to balance of carbohydrate, protein, and fat.  As evidenced by diet hx.    Intervention:  Nutrition counseling/education related to healthier snack options, eating out and lifestyle habits to improve health.  Discussed the benefit of increasing vegetable intake, exercising, and trial of time off diet soda.  Consider using the Calorie Brooke DareKing app to help you become more informed about foods when eating out.  Resume an exercise habit  Glucerna or Boost Glucose Control could be snack options. Consider increasing your intake of non starchy vegetables. Recommend drinking more water.   Consider a trial of 2 weeks without diet soda to see how you feel.  You will need another source of caffeine to avoid headaches. Each snack should be a small amount of protein and you can have about 15 grams of carbohydrates with this. Consider avoiding or limiting eating for 3 hours prior to bed due to increased risk of reflux.  Teaching Method Utilized:  Visual Auditory Hands  on  Handouts given during visit include:  Snack list  Meal plan card  Barriers to learning/adherence to lifestyle change: fatigue, falling back on old habits.  Demonstrated degree of understanding via:  Teach Back   Monitoring/Evaluation:  Dietary intake, exercise, label reading, and body weight prn.

## 2015-09-10 ENCOUNTER — Ambulatory Visit: Payer: 59

## 2015-09-11 ENCOUNTER — Ambulatory Visit: Payer: 59 | Admitting: Physical Therapy

## 2015-09-11 DIAGNOSIS — M5412 Radiculopathy, cervical region: Secondary | ICD-10-CM

## 2015-09-11 DIAGNOSIS — R293 Abnormal posture: Secondary | ICD-10-CM

## 2015-09-11 NOTE — Therapy (Signed)
[facility] [address]  [address] [place], [place], [address] Phone: [phone]   Fax:  [phone]  Physical Therapy Treatment  Patient Details  Name: Nathaniel LukesJeffrey A Deitrick MRN: [id number] Date of Birth: [birth year] Referring Provider: Rodolph BongEvan S Corey, MD  Encounter Date: 09/11/2015      PT End of Session - 09/11/15 0855    Visit Number 2   Number of Visits 12   Date for PT Re-Evaluation 10/20/15   PT Start Time 0847   PT Stop Time 0928   PT Time Calculation (min) 41 min   Activity Tolerance Patient tolerated treatment well   Behavior During Therapy Flat affect      Past Medical History  Diagnosis Date  . Depression   . Hypogonadism male   . Headache(784.0)   . Diabetes mellitus     type II  . GERD (gastroesophageal reflux disease)     Past Surgical History  Procedure Laterality Date  . Tonsillectomy  1972  . Nasal sinus surgery  04/1991    There were no vitals filed for this visit.      Subjective Assessment - 09/11/15 0851    Subjective Pt states his pain is "the usual". Reports has only completed HEP maybe 3 times due to having company, but denies any problems with exercises. Pt states he did change the seat in his car so that he is stiting more upright.   Patient Stated Goals "want to find out what I can do w/o overdoing it, so I can work on building up strength"   Currently in Pain? Yes   Pain Score --  2-3/10   Pain Location Arm  radicular numbness into R UE to just above elbow (more bothersome than neck)    Pain Orientation Right           Today's Treatment  Review of postural education for neutral spine and shoulder posture as pt persists with pronounced fwd head and rounded shoudlers  TherEx Chin tuck 10x5" (pt doesn't like this exercise - states it made the tingling in the L side of the neck worse when he did it at home, but did not note that today when exercise performed) B UT stretch x30"  each Doorway stretch 2x30" Corner stretch 2x30" B Shoulder Rows (with emphasis on good posture) with blue TB 10x3" B Scapular Retraction + Shoulder Extension to neutral with blue TB 10x3" Hooklying over 1/2 foam roller:   Chest/pec stretch x 2'   B Shoulder Horiz ABD with blue TB 10x3"   B Shoulder ER with green TB 10x3"   B Shoulder flexion pullover with 5# db 10x3"   B Shoulder protraction with 5# db 10x3" each   B Shoulder alternating flexion & extension with green TB 10x3"           PT Long Term Goals - 09/11/15 0920    PT LONG TERM GOAL #1   Title Pt will be independent with HEP/gym program by 10/20/15   Status On-going   PT LONG TERM GOAL #2   Title Pt will routinely demonstrate neutral neck and shoulder posture w/o cues by 10/20/15   Status On-going   PT LONG TERM GOAL #3   Title Pt will demonstrate proper body mechanics with weight lifting and report no increased pain with effort by 10/20/15   Status On-going               Plan - 09/11/15 0920  Clinical Impression Statement Pt reporting attempts to correct posture by adjusting seat so that he is sitting more upright but continues to demosntrate pronounced fwd head and rounded shoulders. Reports limited compliance with HEP and states chin tuck caused worsening of the L sided numbness so he stopped doing this. Pt expressing lack of understanding of how current program applies to his goal of being able to work-out lifting weights, therefore reiterated importance of posture, proper alignment and good core control for appropriate body mechanics when beginning a lifting program to avoid creating cervical strain and nerve compression which is likely the cause of his symptoms. Pt tolerated all exercises w/o c/o of increased pain/symptoms but states he won't know until later today or tomorrow if there is a problem as his adverse responses when he tries to lift while working out are not typically immediately apparent. Given poor  initial compliance with HEP, reports of issue with chin tuck, and pt's fear of delayed negative response, deferred HEP update today but will plan to proceed with update at next visit pending repsonse to today's visit.   Clinical Impairments Affecting Rehab Potential Depression, chronic fatigue, uncontrolled DM, chronic headaches   PT Frequency --  1-2x/wk per pt preference   PT Treatment/Interventions Patient/family education;Neuromuscular re-education;Therapeutic exercise;Therapeutic activities;Manual techniques;Dry needling;Taping;Electrical Stimulation;Moist Heat;Cryotherapy;Iontophoresis 4mg /ml Dexamethasone;Ultrasound;Traction;ADLs/Self Care Home Management   PT Next Visit Plan Update HEP; Postural training/strengthening emphasizing anterior flexibility and posterior/scapular stabilization; May consider mechanical traction, dry needling and manual therapy / modalities PRN for pain.   Consulted and Agree with Plan of Care Patient      Patient will benefit from skilled therapeutic intervention in order to improve the following deficits and impairments:  Pain, Impaired sensation, Postural dysfunction, Improper body mechanics, Decreased strength, Impaired UE functional use, Impaired flexibility  Visit Diagnosis: Radiculopathy, cervical region  Abnormal posture     Problem List Patient Active Problem List   Diagnosis Date Noted  . Uncontrolled type 2 diabetes mellitus without complication, with long-term current use of insulin (HCC) 07/31/2015  . Depression 05/15/2015  . Cervical radiculitis 01/25/2015  . BPH (benign prostatic hyperplasia) 09/12/2014  . Foot pain, right 09/05/2013  . Neck pain 02/27/2013  . Herpes zoster 02/22/2013  . Epididymal pain 12/19/2012  . Chronic headaches 09/28/2011  . Seasonal and perennial allergic rhinitis 09/28/2011  . HYPERLIPIDEMIA 12/16/2008  . FATIGUE, CHRONIC 12/16/2008  . HEADACHE 12/16/2008    Marry GuanJoAnne M Lyndle Pang 09/11/2015, 10:39 AM  [facility] [address]  [address] [place], [place], [address] Phone: [phone]   Fax:  [phone]  Name: Nathaniel LukesJeffrey A Alcorn MRN: [id number] Date of Birth: [birth year]

## 2015-09-15 ENCOUNTER — Ambulatory Visit: Payer: 59

## 2015-09-15 DIAGNOSIS — M5412 Radiculopathy, cervical region: Secondary | ICD-10-CM

## 2015-09-15 DIAGNOSIS — R293 Abnormal posture: Secondary | ICD-10-CM

## 2015-09-15 NOTE — Therapy (Addendum)
[facility] [place] [address]  [address] [place], [place], [address] Phone: [phone]   Fax:  [phone]  Physical Therapy Treatment  Patient Details  Name: Nathaniel [name] MRN: [id number] Date of Birth: [birth year] Referring Provider: Rodolph BongEvan S Corey, MD  Encounter Date: 09/15/2015      PT End of Session - 09/15/15 1543    Visit Number 3   Number of Visits 12   Date for PT Re-Evaluation 10/20/15   PT Start Time 1536   PT Stop Time 1617   PT Time Calculation (min) 41 min   Activity Tolerance Patient tolerated treatment well   Behavior During Therapy Flat affect      Past Medical History  Diagnosis Date  . Depression   . Hypogonadism male   . Headache(784.0)   . Diabetes mellitus     type II  . GERD (gastroesophageal reflux disease)     Past Surgical History  Procedure Laterality Date  . Tonsillectomy  1972  . Nasal sinus surgery  04/1991    There were no vitals filed for this visit.      Subjective Assessment - 09/15/15 1542    Subjective Pt. reports he's most comfortable sleeping on his side at this point however isn't sleeping well at all currently.  Pt. is pain free currently however reports he feels tingling around the neck.  Pt. reports he has been scrubbing his deck on all fours the last few days and over the weekend and has been sore since then.     Patient Stated Goals "want to find out what I can do w/o overdoing it, so I can work on building up strength"   Currently in Pain? No/denies   Pain Score 0-No pain   Multiple Pain Sites No      Today's Treatment:   Review of postural education for neutral spine and shoulder posture   TherEx:  UBE: 2 min forward/backwards each, level 1.0 B UT stretch x30" each Doorway stretch 2x30" Corner stretch 2x30" BATCA low row 25# x 15 reps B Scapular Retraction + Shoulder Extension to neutral with blue TB 10x3" Hooklying over 1/2 foam roller:      B Shoulder  Horiz ABD with blue TB 15 x 3"      B Shoulder ER with green TB 10x3"      B Shoulder flexion pullover with 6# db 10x3"      B Shoulder protraction with 6# db 10x3" each      B Shoulder alternating flexion & extension with blue TB 10x3" TRX stretch low/mid x 30 sec  BATCA low row 35# x 10 reps  BATCA single arm row 15# x 15 reps          PT Long Term Goals - 09/11/15 0920    PT LONG TERM GOAL #1   Title Pt will be independent with HEP/gym program by 10/20/15   Status On-going   PT LONG TERM GOAL #2   Title Pt will routinely demonstrate neutral neck and shoulder posture w/o cues by 10/20/15   Status On-going   PT LONG TERM GOAL #3   Title Pt will demonstrate proper body mechanics with weight lifting and report no increased pain with effort by 10/20/15   Status On-going               Plan - 09/15/15 1548    Clinical Impression Statement Pt. reports he's most comfortable sleeping on his side at  this point however isn't sleeping well at all currently.  Pt. is pain free currently however reports he feels tingling around the neck.  Pt. reports he has been scrubbing his deck on all fours the last few days and over the weekend and has been sore since then.  Pt. tolerated increased resistance and repetitions with scapular strengthening activity today well however reported tingling in the neck which remained unchanged throughout therex.  Posture promotion and education continued today with HEP review; pt. reports performing HEP consistently.     PT Treatment/Interventions Patient/family education;Neuromuscular re-education;Therapeutic exercise;Therapeutic activities;Manual techniques;Dry needling;Taping;Electrical Stimulation;Moist Heat;Cryotherapy;Iontophoresis 4mg /ml Dexamethasone;Ultrasound;Traction;ADLs/Self Care Home Management   PT Next Visit Plan Update HEP; Postural training/strengthening emphasizing anterior flexibility and posterior/scapular stabilization; May consider mechanical  traction, dry needling and manual therapy / modalities PRN for pain.      Patient will benefit from skilled therapeutic intervention in order to improve the following deficits and impairments:  Pain, Impaired sensation, Postural dysfunction, Improper body mechanics, Decreased strength, Impaired UE functional use, Impaired flexibility  Visit Diagnosis: Radiculopathy, cervical region  Abnormal posture     Problem List Patient Active Problem List   Diagnosis Date Noted  . Uncontrolled type 2 diabetes mellitus without complication, with long-term current use of insulin (HCC) 07/31/2015  . Depression 05/15/2015  . Cervical radiculitis 01/25/2015  . BPH (benign prostatic hyperplasia) 09/12/2014  . Foot pain, right 09/05/2013  . Neck pain 02/27/2013  . Herpes zoster 02/22/2013  . Epididymal pain 12/19/2012  . Chronic headaches 09/28/2011  . Seasonal and perennial allergic rhinitis 09/28/2011  . HYPERLIPIDEMIA 12/16/2008  . FATIGUE, CHRONIC 12/16/2008  . HEADACHE 12/16/2008    Kermit BaloMicah Carlyn Lemke, PTA 09/15/2015, 6:17 PM  [facility] [place] [address]  [address] [place], [place], [address] Phone: [phone]   Fax:  [phone]  Name: Nathaniel [name] MRN: [id number] Date of Birth: [birth year]

## 2015-09-18 ENCOUNTER — Encounter: Payer: Self-pay | Admitting: Pharmacist

## 2015-09-18 ENCOUNTER — Other Ambulatory Visit: Payer: Self-pay | Admitting: Pharmacist

## 2015-09-18 VITALS — BP 100/66 | Wt 194.4 lb

## 2015-09-18 DIAGNOSIS — Z794 Long term (current) use of insulin: Principal | ICD-10-CM

## 2015-09-18 DIAGNOSIS — E119 Type 2 diabetes mellitus without complications: Secondary | ICD-10-CM

## 2015-09-18 NOTE — Patient Outreach (Signed)
Subjective:  Patient presents today for 3 month diabetes follow-up as part of the employer-sponsored [employer] to Well[employer] program.  Current diabetes regimen includes Lantus 16 units SQ BID and Humalog 6-10 units SQ with meals. Patient continues with ACE-Inhibitor. Most recent MD follow-up was with Dr. Cruzita Lederer in May. Last A1C was in March- 7.6%. Patient has a pending appt for July with Dr. Cruzita Lederer.    Major changes since last visit- he has stopped wearing an insulin pump- he could not find an infusion set that he did not find painful and also did not like wearing a medical device. He stopped wearing the pump in the middle of May.    Patient has also been going to physical therapy for neck pain following an injury 2.5 years ago. He states that he has a bulging disc in his neck.   Assessment:  Diabetes: Most recent A1C was 7.6  % which is exceeding goal of less than 7%. Weight is slightly decreased from last visit with me.    CBG Review: Patient thinks that his CBGs have been a little bit higher since coming off of the pump. When he was on the pump he was going low overnight- he states that now that he is on Lantus twice a day he hasn't had much of an issue with it.   He states that he is checking CBG 4-5 times daily. He is now working a different job and he is traveling to [employer] homes. He states that he is in his car a lot and just overall his routine is different.   14 day CBG average is increased by about 40 points since last visit with me. Fasting CBG is averaging high 100s- low 200s.   He reports low blood sugar is not often- maybe once a week.   High/Low- 387/107  One of his goals at last visit was to bring metformin to work so he can remember to take the second dose. He reports he has done much better with this.   Lifestyle improvements:  Physical Activity-  He started physical therapy because he wanted to exercise in a safe way. He reports that when he was trying to lift weights his  arm pain and numbness would get worse. He has a list of exercises at home that he does twice a day. This usually takes him 10-15 minutes each time.      Nutrition-  Patient met with a dietitian Antonieta Iba) in June. He states that the visit was helpful and he was able to get a list of low carbohydrates snacks. He has been eating those things somewhat over the past few weeks.   He reports that since getting a new job he is on the road more often and is eating out more frequently. He estimates that he is eating out 1-2 times a week.     Follow up with me in 3 months.    Plan/Goals for Next Visit:  1. Continue doing your homework exercises as assigned by the physical therapist.  2. When you are eating out, look up nutrition information for the food you are eating so that you can accurately count carbohydrates.     Someone from [facility] will contact you with your next appointment.    Zareena Willis D. Donneta Romberg, PharmD, BCPS, CDE [facility] [place] Coordinator [phone]

## 2015-09-24 ENCOUNTER — Ambulatory Visit: Payer: 59 | Attending: Family Medicine | Admitting: Physical Therapy

## 2015-09-24 DIAGNOSIS — M5412 Radiculopathy, cervical region: Secondary | ICD-10-CM | POA: Insufficient documentation

## 2015-09-24 DIAGNOSIS — R293 Abnormal posture: Secondary | ICD-10-CM | POA: Diagnosis not present

## 2015-09-24 NOTE — Therapy (Addendum)
[facility] [address]  [address] [place], [place], [address] Phone: [phone]   Fax:  [phone]  Physical Therapy Treatment  Patient Details  Name: Nathaniel [name] MRN: [id number] Date of Birth: [birth year] Referring Provider: Rodolph Bong, MD  Encounter Date: 09/24/2015      PT End of Session - 09/24/15 0801    Visit Number 4   Number of Visits 12   Date for PT Re-Evaluation 10/20/15   PT Start Time 0801   PT Stop Time 0845   PT Time Calculation (min) 44 min   Activity Tolerance Patient tolerated treatment well   Behavior During Therapy Flat affect      Past Medical History  Diagnosis Date  . Depression   . Hypogonadism male   . Headache(784.0)   . Diabetes mellitus     type II  . GERD (gastroesophageal reflux disease)     Past Surgical History  Procedure Laterality Date  . Tonsillectomy  1972  . Nasal sinus surgery  04/1991    There were no vitals filed for this visit.      Subjective Assessment - 09/24/15 0805    Subjective Pt reports increased soreness after last PT visit, but states it went away after a day or two. Denies pain today, but states tingling in L neck and numbness in R upper arm still present at times. Pt noting improving postural awareness, especially when driving (sometimes 2 hrs a day or more) after adjustment of seat and focus of resting head back against head rest.   Patient Stated Goals "want to find out what I can do w/o overdoing it, so I can work on building up strength"   Currently in Pain? No/denies            [facility] PT Assessment - 09/24/15 0801    Assessment   Medical Diagnosis Cervical radiculitis   Referring Provider Rodolph Bong, MD   Onset Date/Surgical Date --  initial diagnosis of cervical HNP 2.5 yrs   Hand Dominance Left   Next MD Visit 09/28/15   AROM   AROM Assessment Site Cervical;Shoulder   Right Shoulder Flexion 157 Degrees   Right Shoulder ABduction  148 Degrees   Right Shoulder Internal Rotation --  FIR to T10   Right Shoulder External Rotation --  FER to T2   Left Shoulder Flexion 161 Degrees   Left Shoulder ABduction 144 Degrees   Left Shoulder Internal Rotation --  FIR to T10   Left Shoulder External Rotation --  FER to T1   Cervical Flexion 46   Cervical Extension 50   Cervical - Right Side Bend 36   Cervical - Left Side Bend 39   Cervical - Right Rotation 74   Cervical - Left Rotation 70   Strength   Overall Strength Comments B shoulder strength 4+/5 w/o pain          Today's Treatment  TherEx  UBE - Lvl 2.0 fwd/back x 2' each Hooklying over 1/2 foam roller:   Chest/pec stretch x 2'  B Shoulder horiz ABD with blue TB 15x3" B Shoulder ER with green TB 15x3" B Shoulder flexion/scaption pullover + [place] horiz ABD isometric with blue TB 10x3" B Shoulder protraction with 5# db 10x3" each   B Shoulder ciricles with 5# CW/CCW x10 each B Shoulder alternating flexion & extension with blue TB 10x3"  ROM/MMT  Standing with back along inside of doorframe: (exercises added  to HEP)   B Shoulder horiz ABD with green TB 10x3" B Shoulder ER with green TB 10x3" B Shoulder alternating flexion & extension with green blue TB 10x3"          PT Education - 09/24/15 0845    Education provided Yes   Education Details Updated HEP - pt to perform standing with back against inside of door frame to promote postural awareness and scapular retraction   Person(s) Educated Patient   Methods Explanation;Demonstration;Handout   Comprehension Verbalized understanding;Returned demonstration             PT Long Term Goals - 09/24/15 0818    PT LONG TERM GOAL #1   Title Pt will be independent with HEP/gym program by 10/20/15   Status Partially Met  Independent with initial HEP, completing exercises 1-2x/day   PT LONG TERM GOAL #2   Title Pt will routinely demonstrate neutral neck and shoulder posture w/o cues  by 10/20/15   Status Partially Met  Pt reporting increased awareness but still noted to sit in fwd flexed posture when unsupported   PT LONG TERM GOAL #3   Title Pt will demonstrate proper body mechanics with weight lifting and report no increased pain with effort by 10/20/15   Status On-going               Plan - 09/24/15 0845    Clinical Impression Statement Pt noting increased soreness after last visit which resolved after a day or two. Remains pain free but still noting intermittent tingling in L side of neck and numbness in R upper arm. Discussed use of mechanical traction given continued radicular symptoms but pt deferred traction today stating no pain or symptoms currently and has home traction unit he has used when symptoms are bothering him. Updated HEP with continued emphasis on postural control, especially scapular stabilization. Pt overall has demonstrtaed good progress thus far with PT; with lessening of pain, improving postural awareness and increasing shoulder ROM (now WFL/WNL).   PT Treatment/Interventions Patient/family education;Neuromuscular re-education;Therapeutic exercise;Therapeutic activities;Manual techniques;Dry needling;Taping;Electrical Stimulation;Moist Heat;Cryotherapy;Iontophoresis '4mg'$ /ml Dexamethasone;Ultrasound;Traction;ADLs/Self Care Home Management   PT Next Visit Plan Update HEP PRN; Postural training/strengthening emphasizing anterior flexibility and posterior/scapular stabilization; May consider mechanical traction, dry needling and manual therapy / modalities PRN for pain.   Consulted and Agree with Plan of Care Patient      Patient will benefit from skilled therapeutic intervention in order to improve the following deficits and impairments:  Pain, Impaired sensation, Postural dysfunction, Improper body mechanics, Decreased strength, Impaired UE functional use, Impaired flexibility  Visit Diagnosis: Radiculopathy, cervical region  Abnormal  posture     Problem List Patient Active Problem List   Diagnosis Date Noted  . Uncontrolled type 2 diabetes mellitus without complication, with long-term current use of insulin ([place]) 07/31/2015  . Depression 05/15/2015  . Cervical radiculitis 01/25/2015  . BPH (benign prostatic hyperplasia) 09/12/2014  . Foot pain, right 09/05/2013  . Neck pain 02/27/2013  . Herpes zoster 02/22/2013  . Epididymal pain 12/19/2012  . Chronic headaches 09/28/2011  . Seasonal and perennial allergic rhinitis 09/28/2011  . HYPERLIPIDEMIA 12/16/2008  . FATIGUE, CHRONIC 12/16/2008  . HEADACHE 12/16/2008    Percival Spanish, PT, MPT  09/24/2015, 9:08 AM  [facility] [address]  [place], [place], [address] Phone: [phone]   Fax:  [phone]  Name: BENSYN BORNEMANN MRN: [id number] Date of Birth: 02/02/1966   PHYSICAL THERAPY DISCHARGE SUMMARY  Visits from Start of Care: 4  Current functional level related to goals / functional outcomes:   As of last PT visit pt was demonstrating good progress with PT; with lessening of pain, improving postural awareness and increasing shoulder ROM (now WFL/WNL).Saw MD after which pt called requesting to be D/C'd from PT as MD had told him he did not need further PT. Goals only partially met and training in proper weight lifting technique not completed due to limited number of visits.   Remaining deficits:   As above. Pt to continue with HEP.   Education / Equipment:   HEP  Plan: Patient agrees to discharge.  Patient goals were partially met. Patient is being discharged due to the patient's request.  ?????       Percival Spanish, PT, MPT 09/29/2015, 3:59 PM  [facility] [address]  [place], [place], [address] Phone: [phone]   Fax:  [phone]

## 2015-09-28 ENCOUNTER — Encounter: Payer: Self-pay | Admitting: Family Medicine

## 2015-09-28 ENCOUNTER — Ambulatory Visit (INDEPENDENT_AMBULATORY_CARE_PROVIDER_SITE_OTHER): Payer: 59 | Admitting: Family Medicine

## 2015-09-28 VITALS — BP 123/78 | HR 65 | Wt 196.0 lb

## 2015-09-28 DIAGNOSIS — M542 Cervicalgia: Secondary | ICD-10-CM | POA: Diagnosis not present

## 2015-09-28 NOTE — Patient Instructions (Signed)
Thank you for coming in today. Continue PT and return as needed.

## 2015-09-28 NOTE — Progress Notes (Signed)
Nathaniel [name] is a 50 y.o. male who presents to [facility] [place] today for follow-up cervical radiculopathy. Patient was seen about one month ago for neck pain and cervical radiculopathy. He had a trial of physical therapy and feels mostly better. He notes some continued as radiating numbness and tingly sensation to his hand but otherwise is symptom free. He has one more visit with physical therapy. In the past used tried gabapentin and Lyrica but found them either have very effective or noxious. He feels pretty well without things are going.   Past Medical History  Diagnosis Date  . Depression   . Hypogonadism male   . Headache(784.0)   . Diabetes mellitus     type II  . GERD (gastroesophageal reflux disease)    Past Surgical History  Procedure Laterality Date  . Tonsillectomy  1972  . Nasal sinus surgery  04/1991   Social History  Substance Use Topics  . Smoking status: Never Smoker   . Smokeless tobacco: Never Used  . Alcohol Use: No   family history includes Cancer (age of onset: 40) in his father; Diabetes Mellitus I in his son; Healthy in his mother. He was adopted.  ROS as above:  Medications: Current Outpatient Prescriptions  Medication Sig Dispense Refill  . Alcohol Swabs (ALCOHOL PREPS) PADS Use alcohol pads to clean site to be used to check blood sugar. 100 each 11  . BAYER CONTOUR NEXT TEST test strip USE TO TEST BLOOD SUGAR 4 TIMES DAILY AS INSTRUCTED 125 each 5  . Blood Glucose Monitoring Suppl (BAYER CONTOUR NEXT USB MONITOR) W/DEVICE KIT Use kit as directed 1 kit 0  . Insulin Glargine (LANTUS SOLOSTAR) 100 UNIT/ML Solostar Pen Inject under skin 16 units 2x a day 15 pen 5  . insulin lispro (HUMALOG KWIKPEN) 100 UNIT/ML KiwkPen Inject 0.06-0.1 mLs (6-10 Units total) into the skin 3 (three) times daily. 15 mL 5  . lisinopril (PRINIVIL,ZESTRIL) 2.5 MG  tablet TAKE 1 TABLET BY MOUTH DAILY. 90 tablet 3  . metFORMIN (GLUCOPHAGE) 1000 MG tablet TAKE 1 TABLET BY MOUTH 2 TIMES DAILY WITH A MEAL. 180 tablet 3  . saw palmetto 160 MG capsule Take 160 mg by mouth 2 (two) times daily.    Marland Kitchen glucagon (GLUCAGON EMERGENCY) 1 MG injection Inject 1 mg into the muscle once as needed. (Patient not taking: Reported on 09/28/2015) 1 each 3   No current facility-administered medications for this visit.   Allergies  Allergen Reactions  . Cymbalta [Duloxetine Hcl]     Suicidal thoughts  . Eggs Or Egg-Derived Products     childhood  . Penicillins   . Pioglitazone Other (See Comments)      headache  . Promethazine Hcl Nausea And Vomiting       . Reglan [Metoclopramide] Other (See Comments)    Jittery, increased anxiety     Exam:  BP 123/78 mmHg  Pulse 65  Wt 196 lb (88.905 kg) Gen: Well NAD C-spine: Normal flexion and extension. Normal rotation. Impaired lateral flexion bilaterally by about 15 Upper extremity strength sensation reflexes are equal and normal throughout. No tremor  No results found for this or any previous visit (from the past 24 hour(s)). No results found.    Assessment and Plan: 50 y.o. male with cervical radiculopathy. Doing well. Continue home exercises and physical therapy as needed. Return as needed.  Discussed warning signs or symptoms. Please see discharge instructions. Patient  expresses understanding.

## 2015-09-29 ENCOUNTER — Ambulatory Visit: Payer: 59 | Admitting: Physical Therapy

## 2015-09-30 MED FILL — LANTUS SOLOSTAR 100 UNITS/M: 100 | 47 days supply | Qty: 15 | Fill #1

## 2015-10-01 MED FILL — HUMALOG 100 UNITS/ML KWIKPE: 100 | 50 days supply | Qty: 15 | Fill #1

## 2015-10-02 ENCOUNTER — Encounter: Payer: Self-pay | Admitting: Internal Medicine

## 2015-10-02 ENCOUNTER — Ambulatory Visit (INDEPENDENT_AMBULATORY_CARE_PROVIDER_SITE_OTHER): Payer: 59 | Admitting: Internal Medicine

## 2015-10-02 VITALS — BP 96/68 | HR 68 | Temp 98.0°F | Ht 72.0 in | Wt 193.0 lb

## 2015-10-02 DIAGNOSIS — Z794 Long term (current) use of insulin: Secondary | ICD-10-CM

## 2015-10-02 DIAGNOSIS — IMO0002 Reserved for concepts with insufficient information to code with codable children: Secondary | ICD-10-CM

## 2015-10-02 DIAGNOSIS — E1165 Type 2 diabetes mellitus with hyperglycemia: Secondary | ICD-10-CM | POA: Diagnosis not present

## 2015-10-02 DIAGNOSIS — E1065 Type 1 diabetes mellitus with hyperglycemia: Secondary | ICD-10-CM | POA: Insufficient documentation

## 2015-10-02 HISTORY — DX: Reserved for concepts with insufficient information to code with codable children: IMO0002

## 2015-10-02 LAB — POCT GLYCOSYLATED HEMOGLOBIN (HGB A1C): Hemoglobin A1C: 9.3

## 2015-10-02 MED ORDER — ALBIGLUTIDE 30 MG ~~LOC~~ PEN: PEN_INJECTOR | SUBCUTANEOUS | Status: DC

## 2015-10-02 MED FILL — TANZEUM 30 MG PEN INJECT: 30 | 28 days supply | Qty: 4 | Fill #0

## 2015-10-02 NOTE — Progress Notes (Signed)
Pre visit review using our clinic review tool, if applicable. No additional management support is needed unless otherwise documented below in the visit note. 

## 2015-10-02 NOTE — Progress Notes (Signed)
Patient ID: Nathaniel [name], male   DOB: 1965/07/01, 50 y.o.   MRN: [id number]  HPI: Nathaniel [name] is a 50 y.o.-year-old man, returning for f/u for DM2, dx 2001, insulin-dependent since 2010, uncontrolled, without complications. Last visit 2 mo ago.  He was on a pump: Medtronic 630G >> now off if he decided that he does not want the pump anymore.  He is stressed at work << more responsibility in his new job.  Last hemoglobin A1c was: Lab Results  Component Value Date   HGBA1C 7.6* 05/29/2015   HGBA1C 9.3 02/27/2015   HGBA1C 7.7* 09/26/2014  11/08/2013: 9.5% 04/11/2013: 9.8%  Pt was on an insulin pump before: - basal rates: 12 am: 0.750 10 am: 1.1  10 pm: 1.1 >> 1.0 - ICR:  6 am: 8.5 >> 8  10 am: 8.5 - target: 100-120 >> 110- 110 - ISF: 35 >> 40 - Insulin on Board: 4h - bolus wizard: on Please use a temporary basal rate of 70% for 4h after evening exercise. For daytime exercise, you may not need this, but may need a snack (15g of carbs) before exercise.  Now on: - Metformin 1000 mg 2x a day - Lantus 16 units in am and 16 units in hs  - Humalog as follows: - Insulin to carb ratio: 1:7 - target: 120 - Insulin sensitivity factor: 30  Pt checks his sugars 4-5x a day and they are: - am:71-133 >> 68-232, 266 >> 101-204, 221, 243 >>  - 2h after b'fast: 164-214, 285 (after potluck) >> 67x1, 126-193, 245, 299 today >> 87-256 >> 194-272 >> 83-309 - before lunch: 197-217 >> 180-250s >> 97-390 >> 91 >> 119-202 >> 153-197 >> 90-214 >> 128 -225 >> >> 159-383 - 2h after lunch: 106-503 >> 122-173 >> 83-302 >> 72 >> 119-256 >> 140-269 >> 124-213 >> 162-263, 287 >> 181-279 - before dinner: 98-242, 335 >> 130-342 >> 145 >> 143-189 >> 113-237 >> 56, 63-160, 228 >> 129-251 >> >> 124-302 - 2h after dinner: 66, 102-197 >> 159-342 >> 149 >> 141-203 >> 126-379 >> 127-219 >> 83-254, 346 >> n/c - bedtime: 160-180 >> n/c >> 357 >> see above >> 116-224 >> see above -  Nighttime: 43-83 >> 168-277  >> 214, 183 Lowest: 67x1 >> 43, 48, 53 >> 101 >> 83. Highest: 370 >> 300s (missed Lantus).  Pt's meals are: - Breakfast: cereal + 1% milk, with protein bar - Lunch: sandwich - Dinner: eating out or eating in - Snacks: 1-2 a day  - no CKD, last BUN/creatinine:  Lab Results  Component Value Date   BUN 12 08/14/2015   CREATININE 1.01 08/14/2015  He is on Lisinopril. - last set of lipids: Lab Results  Component Value Date   CHOL 180 08/14/2015   HDL 47.40 08/14/2015   LDLCALC 105* 08/14/2015   TRIG 134.0 08/14/2015   CHOLHDL 4 08/14/2015  - last eye exam was 04/07/2015. No DR.  - no numbness and tingling in his feet.   I reviewed pt's medications, allergies, PMH, social hx, family hx, and changes were documented in the history of present illness. Otherwise, unchanged from my initial visit note.   ROS: Constitutional: no weight gain/loss,no fatigue, no subjective hyperthermia/hypothermia Eyes: no blurry vision, no xerophthalmia ENT: no sore throat, no nodules palpated in throat, no dysphagia/odynophagia, no hoarseness Cardiovascular: no CP/SOB/palpitations/leg swelling Respiratory: no cough/no SOB Gastrointestinal: no N/V/D/C Musculoskeletal: no muscle aches/no joint aches Skin: no rashes Neurological: no tremors/numbness/tingling/dizziness  PE:  BP 96/68 mmHg  Pulse 68  Temp(Src) 98 F (36.7 C) (Oral)  Ht 6' (1.829 m)  Wt 193 lb (87.544 kg)  BMI 26.17 kg/m2  SpO2 98% Body mass index is 26.17 kg/(m^2).  Wt Readings from Last 3 Encounters:  10/02/15 193 lb (87.544 kg)  09/28/15 196 lb (88.905 kg)  09/18/15 194 lb 6.4 oz (88.179 kg)   Constitutional: normal weight, in NAD Eyes: PERRLA, EOMI, no exophthalmos ENT: moist mucous membranes, no thyromegaly, no cervical lymphadenopathy Cardiovascular: RRR, No MRG Respiratory: CTA B Gastrointestinal: abdomen soft, NT, ND, BS+ Musculoskeletal: no deformities, strength intact in all 4 Skin: moist, warm, no  rashes Neurological: no tremor with outstretched hands, DTR normal in all 4  ASSESSMENT: 1. DM2, insulin-dependent, uncontrolled, without complications - Received records with C-peptide of 3.67 (0.8-3.9), from 06/16/2005 ([facility]). No associated glucose reported. - son with DM1 (not on a pump)  PLAN:  1. Patient with long-standing, uncontrolled diabetes. He was doing much better on an insulin pump, however, he decided that that he would want to switch back to a basal-bolus regimen, which she started 2 mo ago. Sugars are higher, following the staircase pattern of increasing sugars as the day goes by, a sign of not enough mealtime insulin. Will increase his Lantus in am and try to add Tanzeum. -  We will continue metformin for now - I advised him to: Patient Instructions  Please continue: - Metformin 1000 mg 2x a day - Humalog as follows: - Insulin to carb ratio: 1:7 - target: 120 - Insulin sensitivity factor: 30  Please increase: - Lantus to 18 units in am and 16 units at night  Start Tanzeum 30 mg weekly. Let me know if 2-3 weeks if I need to call in the higher dose.  Please return in 3 months with your sugar log.   - continue checking sugars at different times of the day - check 4 times a day, rotating checks - UTD with yearly eye exams - will check HbA1c >> 9.3% (higher) - Return to clinic in 3 mo with sugar log

## 2015-10-02 NOTE — Patient Instructions (Addendum)
Please continue: - Metformin 1000 mg 2x a day - Humalog as follows: - Insulin to carb ratio: 1:7 - target: 120 - Insulin sensitivity factor: 30  Please increase: - Lantus to 18 units in am and 16 units at night  Start Tanzeum 30 mg weekly. Let me know if 2-3 weeks if I need to call in the higher dose.  Please return in 3 months with your sugar log.

## 2015-10-07 ENCOUNTER — Encounter: Payer: Self-pay | Admitting: Family Medicine

## 2015-10-07 MED ORDER — AMITRIPTYLINE HCL 25 MG PO TABS: 25.0000 mg | ORAL_TABLET | Freq: Every day | ORAL | Status: DC

## 2015-10-07 MED FILL — AMITRIPTYLINE HCL 25 MG TAB: 25 | 30 days supply | Qty: 30 | Fill #0

## 2015-10-12 ENCOUNTER — Encounter: Payer: Self-pay | Admitting: Internal Medicine

## 2015-10-12 ENCOUNTER — Encounter: Payer: Self-pay | Admitting: Emergency Medicine

## 2015-10-12 ENCOUNTER — Emergency Department
Admission: EM | Admit: 2015-10-12 | Discharge: 2015-10-12 | Disposition: A | Discharge status: Home / Self Care | Payer: 59 | Source: Home / Self Care | Attending: Family Medicine | Admitting: Family Medicine

## 2015-10-12 DIAGNOSIS — N400 Enlarged prostate without lower urinary tract symptoms: Secondary | ICD-10-CM | POA: Diagnosis not present

## 2015-10-12 DIAGNOSIS — R110 Nausea: Secondary | ICD-10-CM | POA: Diagnosis not present

## 2015-10-12 DIAGNOSIS — N3090 Cystitis, unspecified without hematuria: Secondary | ICD-10-CM

## 2015-10-12 LAB — POCT URINALYSIS DIP (MANUAL ENTRY)
Blood, UA: NEGATIVE
Glucose, UA: 250 — AB
Ketones, POC UA: NEGATIVE
Leukocytes, UA: NEGATIVE
Nitrite, UA: NEGATIVE
Protein Ur, POC: NEGATIVE
Spec Grav, UA: <=1.005 (ref 1.005–1.03)
Urobilinogen, UA: 0.2 (ref 0–1)
pH, UA: 6 (ref 5–8)

## 2015-10-12 MED ORDER — ONDANSETRON HCL 4 MG PO TABS
4.0000 mg | ORAL_TABLET | Freq: Once | ORAL | Status: AC
  Administered 2015-10-12: 4 mg via ORAL

## 2015-10-12 MED ORDER — TAMSULOSIN HCL 0.4 MG PO CAPS: 0.4000 mg | ORAL_CAPSULE | Freq: Every day | ORAL | 0 refills | Status: DC

## 2015-10-12 NOTE — ED Triage Notes (Signed)
Reports 3 days of intermittent pain in right lower pelvis near bladder; today is also nauseated. FBG to day 178; BG at 1100=298>sliding scale insulin; now BG is 181.

## 2015-10-12 NOTE — ED Provider Notes (Signed)
CSN: [id number]     Arrival date & time 10/12/15  1150 History   None    Chief Complaint  Patient presents with  . Nausea  . Dysuria   (Consider location/radiation/quality/duration/timing/severity/associated sxs/prior Treatment) HPI  Nathaniel [name] is a 50 y.o. male presenting to UC with c/o 3 days of intermittent pain in Right lower pelvis near his bladder, mild nausea but no vomiting or diarrhea.  He notes he has had similar symptoms in the past and was dx with BPH, took flomax for several weeks, symptoms resolved. He has not been taking any flomax recently. He has not called [facility] or PCP since onset of symptoms last night. Denies fever, chills, back pain or hx of kidney stones. Denies hematuria. No hx of abdominal surgeries.   Past Medical History:  Diagnosis Date  . Depression   . Diabetes mellitus    type II  . GERD (gastroesophageal reflux disease)   . Headache(784.0)   . Hypogonadism male    Past Surgical History:  Procedure Laterality Date  . NASAL SINUS SURGERY  04/1991  . TONSILLECTOMY  1972   Family History  Problem Relation Age of Onset  . Adopted: Yes  . Cancer Father 63    Prostate cancer  . Healthy Mother   . Diabetes Mellitus I Son    Social History  Substance Use Topics  . Smoking status: Never Smoker  . Smokeless tobacco: Never Used  . Alcohol use No    Review of Systems  Constitutional: Negative for chills and fever.  Respiratory: Negative for cough and shortness of breath.   Cardiovascular: Negative for chest pain and palpitations.  Gastrointestinal: Positive for abdominal pain ( "bladder") and nausea. Negative for diarrhea and vomiting.  Genitourinary: Positive for dysuria. Negative for frequency, hematuria, scrotal swelling, testicular pain and urgency.  Musculoskeletal: Negative for arthralgias, back pain and myalgias.  Skin: Negative for rash.  All other systems reviewed and are negative.   Allergies  Cymbalta [duloxetine hcl];  Eggs or egg-derived products; Penicillins; Pioglitazone; Promethazine hcl; and Reglan [metoclopramide]  Home Medications   Prior to Admission medications   Medication Sig Start Date End Date Taking? Authorizing Provider  Albiglutide (TANZEUM) 30 MG PEN Inject under skin 30 mg weekly. 10/02/15   Philemon Kingdom, MD  Alcohol Swabs (ALCOHOL PREPS) PADS Use alcohol pads to clean site to be used to check blood sugar. 05/02/14   Philemon Kingdom, MD  amitriptyline (ELAVIL) 25 MG tablet Take 1 tablet (25 mg total) by mouth at bedtime. 10/07/15   Gregor Hams, MD  BAYER CONTOUR NEXT TEST test strip USE TO TEST BLOOD SUGAR 4 TIMES DAILY AS INSTRUCTED 08/05/15   Philemon Kingdom, MD  Blood Glucose Monitoring Suppl (BAYER CONTOUR NEXT USB MONITOR) W/DEVICE KIT Use kit as directed 05/02/14   Philemon Kingdom, MD  glucagon (GLUCAGON EMERGENCY) 1 MG injection Inject 1 mg into the muscle once as needed. 05/05/15   Philemon Kingdom, MD  Insulin Glargine (LANTUS SOLOSTAR) 100 UNIT/ML Solostar Pen Inject under skin 16 units 2x a day 07/31/15   Philemon Kingdom, MD  insulin lispro (HUMALOG KWIKPEN) 100 UNIT/ML KiwkPen Inject 0.06-0.1 mLs (6-10 Units total) into the skin 3 (three) times daily. 07/31/15   Philemon Kingdom, MD  lisinopril (PRINIVIL,ZESTRIL) 2.5 MG tablet TAKE 1 TABLET BY MOUTH DAILY. 08/05/15   Brunetta Jeans, PA-C  metFORMIN (GLUCOPHAGE) 1000 MG tablet TAKE 1 TABLET BY MOUTH 2 TIMES DAILY WITH A MEAL. 07/31/15   Philemon Kingdom, MD  saw palmetto 160 MG capsule Take 160 mg by mouth 2 (two) times daily.    Historical Provider, MD  tamsulosin (FLOMAX) 0.4 MG CAPS capsule Take 1 capsule (0.4 mg total) by mouth daily. 10/12/15   Noland Fordyce, PA-C   Meds Ordered and Administered this Visit   Medications  ondansetron [facility]) tablet 4 mg (4 mg Oral Given 10/12/15 1217)    BP 105/72 (BP Location: Left Arm)   Pulse 89   Temp 98.5 F (36.9 C) (Oral)   Resp 16   Ht 6' (1.829 m)   Wt 192 lb (87.1 kg)    SpO2 98%   BMI 26.04 kg/m  No data found.   Physical Exam  Constitutional: He appears well-developed and well-nourished.  HENT:  Head: Normocephalic and atraumatic.  Eyes: Conjunctivae are normal. No scleral icterus.  Neck: Normal range of motion.  Cardiovascular: Normal rate, regular rhythm and normal heart sounds.   Pulmonary/Chest: Effort normal and breath sounds normal. No respiratory distress. He has no wheezes. He has no rales.  Abdominal: Soft. Bowel sounds are normal. He exhibits no distension and no mass. There is no tenderness. There is no rebound, no guarding and no CVA tenderness.  Musculoskeletal: Normal range of motion.  Neurological: He is alert.  Skin: Skin is warm and dry.  Nursing note and vitals reviewed.   Urgent Care Course   Clinical Course    Procedures (including critical care time)  Labs Review Labs Reviewed  POCT URINALYSIS DIP (MANUAL ENTRY) - Abnormal; Notable for the following:       Result Value   Color, UA light yellow (*)    Glucose, UA =250 (*)    All other components within normal limits  URINE CULTURE    Imaging Review No results found.    MDM   1. Nausea   2. Cystitis    Pt c/o bladder pain for about 3 days, mild nausea today.  Normal exam. UA not concerning for UTI.  Hx of BPH in the past. Will try Flomax to help with symptoms.  Encouraged to stay well hydrated. F/u with PCP and/or [facility] later this week if not improving, sooner if worsening. Patient verbalized understanding and agreement with treatment plan.     Noland Fordyce, PA-C 10/12/15 1344

## 2015-10-13 ENCOUNTER — Ambulatory Visit (INDEPENDENT_AMBULATORY_CARE_PROVIDER_SITE_OTHER): Payer: 59 | Admitting: Family Medicine

## 2015-10-13 ENCOUNTER — Telehealth: Payer: Self-pay | Admitting: *Deleted

## 2015-10-13 ENCOUNTER — Encounter: Payer: Self-pay | Admitting: Family Medicine

## 2015-10-13 VITALS — BP 107/71 | HR 104 | Temp 98.4°F | Wt 196.0 lb

## 2015-10-13 DIAGNOSIS — Z794 Long term (current) use of insulin: Secondary | ICD-10-CM | POA: Diagnosis not present

## 2015-10-13 DIAGNOSIS — E1165 Type 2 diabetes mellitus with hyperglycemia: Secondary | ICD-10-CM

## 2015-10-13 DIAGNOSIS — R112 Nausea with vomiting, unspecified: Secondary | ICD-10-CM

## 2015-10-13 LAB — CBC WITH DIFFERENTIAL/PLATELET
Basophils Absolute: 0 cells/uL (ref 0–200)
Basophils Relative: 0 %
Eosinophils Absolute: 0 cells/uL — ABNORMAL LOW (ref 15–500)
Eosinophils Relative: 0 %
HCT: 41.2 % (ref 38.5–50.0)
Hemoglobin: 14.1 g/dL (ref 13.2–17.1)
Lymphocytes Relative: 13 %
Lymphs Abs: 1131 cells/uL (ref 850–3900)
MCH: 28.3 pg (ref 27.0–33.0)
MCHC: 34.2 g/dL (ref 32.0–36.0)
MCV: 82.7 fL (ref 80.0–100.0)
MPV: 10.4 fL (ref 7.5–12.5)
Monocytes Absolute: 609 cells/uL (ref 200–950)
Monocytes Relative: 7 %
Neutro Abs: 6960 cells/uL (ref 1500–7800)
Neutrophils Relative %: 80 %
Platelets: 240 10*3/uL (ref 140–400)
RBC: 4.98 MIL/uL (ref 4.20–5.80)
RDW: 13 % (ref 11.0–15.0)
WBC: 8.7 10*3/uL (ref 3.8–10.8)

## 2015-10-13 MED ORDER — ONDANSETRON HCL 4 MG PO TABS: 4.0000 mg | ORAL_TABLET | Freq: Four times a day (QID) | ORAL | 0 refills | Status: DC

## 2015-10-13 MED FILL — ONDANSETRON HCL 4 MG TABLET: 4 | 3 days supply | Qty: 12 | Fill #0

## 2015-10-13 NOTE — Telephone Encounter (Signed)
Pt is asking for something for nausea to to be called in to [facility] pharmacy, he was given something here yesterday that helped but no Rx.

## 2015-10-13 NOTE — Progress Notes (Signed)
Subjective:    CC:   HPI: pt reports that he has been nauseated x 2 days .Marland Kitchen He was previously having pain in his Right abdomen,upset stomach, but feels like the pain is gone. Nedra Hai see note from urgent care. They felt he was likely having some cystitis type symptoms. Around 1:00 this afternoon he had she started vomiting. He has vomited 3 times. The abdominal pain is now gone. He denies any recent change in stools or bowel movements. No blood in the stool. No dysuria. He has had a headache this afternoon. No fevers chills or sweats.   He has not taken his insulin today. his AM fasting BS was 156 he had a sancwich for breakfast around 8AM he took his BS again around 1221 it was 176 he has not been able to keep anything down he has been trying to stay hydrated. He did start Tanzeum about a week and a half ago.  Past medical history, Surgical history, Family history not pertinant except as noted below, Social history, Allergies, and medications have been entered into the medical record, reviewed, and corrections made.   Review of Systems: No fevers, chills, night sweats, weight loss, chest pain, or shortness of breath.   Objective:    General: Well Developed, well nourished, and in no acute distress.  Neuro: Alert and oriented x3, extra-ocular muscles intact, sensation grossly intact.  HEENT: Normocephalic, atraumatic, OP clear, EOMI, PEERLA, TMs and canals are clear.  No Cervical LN.    Skin: Warm and dry, no rashes. Cardiac: Regular rate and rhythm, no murmurs rubs or gallops, no lower extremity edema.  Respiratory: Clear to auscultation bilaterally. Not using accessory muscles, speaking in full sentences. Abd: soft, Nontender, no rebound or guard.     Impression and Recommendations:   Vomiting - Will do some additional blood work including CBC, amylase, lipase and CMP. Will call for results once available. Can use Zofran as needed.Currently a new prescription was sent this morning from urgent  care to his pharmacy. Encouraged him to work on increasing fluid and hydration intake. We'll evaluate for pancreatitis as well. Continue to monitor blood sugars carefully and did encourage him to really push his fluids today when he gets home.  DM, uncontrolled. - sugars mildly elevated but not high enough I would think DKA.  Will check labs and call with results. Work on hydration.   Lab Results  Component Value Date   HGBA1C 9.3 10/02/2015

## 2015-10-13 NOTE — Patient Instructions (Signed)
Ok to stop the flomax

## 2015-10-13 NOTE — Telephone Encounter (Signed)
Left message on  Patients personal mobile, RX called in and f/u with PCP or urology in 2-3 days.

## 2015-10-13 NOTE — Telephone Encounter (Signed)
Please let pt know I e-scribed zofran for his nausea. Please have him follow up with Primary care or urology in 2-3 days if not improving.

## 2015-10-14 LAB — COMPLETE METABOLIC PANEL WITH GFR
ALT: 13 U/L (ref 9–46)
AST: 17 U/L (ref 10–35)
Albumin: 4.4 g/dL (ref 3.6–5.1)
Alkaline Phosphatase: 58 U/L (ref 40–115)
BUN: 14 mg/dL (ref 7–25)
CO2: 25 mmol/L (ref 20–31)
Calcium: 9.1 mg/dL (ref 8.6–10.3)
Chloride: 98 mmol/L (ref 98–110)
Creat: 1.04 mg/dL (ref 0.70–1.33)
GFR, Est African American: >89 mL/min (ref >=60)
GFR, Est Non African American: 83 mL/min (ref >=60)
Glucose, Bld: 143 mg/dL — ABNORMAL HIGH (ref 65–99)
Potassium: 4.3 mmol/L (ref 3.5–5.3)
Sodium: 134 mmol/L — ABNORMAL LOW (ref 135–146)
Total Bilirubin: 1.4 mg/dL — ABNORMAL HIGH (ref 0.2–1.2)
Total Protein: 6.8 g/dL (ref 6.1–8.1)

## 2015-10-14 LAB — LIPASE: Lipase: 8 U/L (ref 7–60)

## 2015-10-14 LAB — AMYLASE: Amylase: 33 U/L (ref 0–105)

## 2015-10-19 ENCOUNTER — Other Ambulatory Visit: Payer: Self-pay | Admitting: Internal Medicine

## 2015-10-19 MED ORDER — ALBIGLUTIDE 30 MG ~~LOC~~ PEN: PEN_INJECTOR | SUBCUTANEOUS | 5 refills | Status: DC

## 2015-10-30 MED FILL — CONTOUR NEXT STRIPS: 75 days supply | Qty: 300 | Fill #1

## 2015-11-03 MED FILL — TANZEUM 30 MG PEN INJECT: 30 | 28 days supply | Qty: 4 | Fill #0

## 2015-11-24 DIAGNOSIS — N401 Enlarged prostate with lower urinary tract symptoms: Secondary | ICD-10-CM | POA: Diagnosis not present

## 2015-11-24 DIAGNOSIS — R3911 Hesitancy of micturition: Secondary | ICD-10-CM | POA: Diagnosis not present

## 2015-11-24 DIAGNOSIS — R3912 Poor urinary stream: Secondary | ICD-10-CM | POA: Diagnosis not present

## 2015-11-24 DIAGNOSIS — R350 Frequency of micturition: Secondary | ICD-10-CM | POA: Diagnosis not present

## 2015-11-26 ENCOUNTER — Encounter: Payer: Self-pay | Admitting: Internal Medicine

## 2015-11-30 ENCOUNTER — Encounter: Payer: Self-pay | Admitting: Pharmacist

## 2015-11-30 ENCOUNTER — Other Ambulatory Visit: Payer: Self-pay | Admitting: Pharmacist

## 2015-11-30 MED FILL — LISINOPRIL 2.5 MG TABLET: 2.5 | 90 days supply | Qty: 90 | Fill #1

## 2015-11-30 MED FILL — LANTUS SOLOSTAR 100 UNITS/M: 100 | 47 days supply | Qty: 15 | Fill #2

## 2015-11-30 MED FILL — HUMALOG 100 UNITS/ML KWIKPE: 100 | 50 days supply | Qty: 15 | Fill #2

## 2015-11-30 NOTE — Patient Outreach (Signed)
[facility]) Care Management  11/30/2015  Mosie LukesJeffrey A Twardowski 03/22/1965 [id number]   Called to schedule Link to Wellness 3 month follow up for diabetes with Hazle NordmannKelsy Combs, Pharm.D. Left message on home and mobile phone requesting that the patient call back to schedule.   Allena Katzaroline E Welles, Pharm.D.

## 2015-12-21 MED FILL — metFORMIN HCL 1000 MG TABS: 1000 | 90 days supply | Qty: 180 | Fill #1

## 2015-12-21 MED FILL — TAMSULOSIN HCL 0.4 MG CAP: 0.4 | 30 days supply | Qty: 30 | Fill #1

## 2016-01-01 ENCOUNTER — Ambulatory Visit: Payer: 59 | Admitting: Internal Medicine

## 2016-01-11 ENCOUNTER — Other Ambulatory Visit: Payer: Self-pay

## 2016-01-14 NOTE — Patient Outreach (Signed)
[name]) Care Management  [place]   01/12/16  Nathaniel [name] 06/24/1965 [id number]   Subjective: Patient presents today for his 3 month diabetes follow-up as part of the employer-sponsored Link to Wellness program.  Current diabetes regimen includes Lantus, Humalog, and metformin.  Patient also continues on lisinopril.  His is not on aspirin due to his age or on a statin.  Most recent MD follow-up was a few months a go.  He recently was taken off an insulin pump and now is on Lantus and Humalog.  His A1c has increased since coming off the pump.  He has also increased his exercise since his last visit as well.  He has not seen a change in his weight but has noticed a change in how his clothes fit.     Patient reported dietary habits: Eats 3 meals/day  Patient reported exercise habits: cardio 6 to 7 times a day followed by weights.  Patient reports hypoglycemic events. 2 to 3 in the last month Patient denies nocturia.  Patient denies pain/burning upon urination.  Patient denies neuropathy. Patient denies visual changes. Patient reports self foot exams.   Patient reported self monitored blood glucose frequency four times a day. Home fasting CBG: 14 day average 173    Objective:  Lab Results  Component Value Date   HGBA1C 9.3 10/02/2015   Vitals:   01/12/16 1147  BP: (!) 144/92    Lipid Panel     Component Value Date/Time   CHOL 180 08/14/2015 0849   TRIG 134.0 08/14/2015 0849   HDL 47.40 08/14/2015 0849   CHOLHDL 4 08/14/2015 0849   VLDL 26.8 08/14/2015 0849   LDLCALC 105 (H) 08/14/2015 0849     10 year ASCVD risk: 8%    Encounter Medications: Outpatient Encounter Prescriptions as of 01/11/2016  Medication Sig  . Alcohol Swabs (ALCOHOL PREPS) PADS Use alcohol pads to clean site to be used to check blood sugar.  . BAYER CONTOUR NEXT TEST test strip USE TO TEST BLOOD SUGAR 4 TIMES DAILY AS INSTRUCTED  . Blood Glucose Monitoring Suppl  (BAYER CONTOUR NEXT USB MONITOR) W/DEVICE KIT Use kit as directed  . glucagon (GLUCAGON EMERGENCY) 1 MG injection Inject 1 mg into the muscle once as needed.  . Insulin Glargine (LANTUS SOLOSTAR) 100 UNIT/ML Solostar Pen Inject 18 units in the morning and 14 units in the evening  . insulin lispro (HUMALOG KWIKPEN) 100 UNIT/ML KiwkPen Inject 0.06-0.1 mLs (6-10 Units total) into the skin 3 (three) times daily.  Marland Kitchen lisinopril (PRINIVIL,ZESTRIL) 2.5 MG tablet TAKE 1 TABLET BY MOUTH DAILY.  . metFORMIN (GLUCOPHAGE) 1000 MG tablet TAKE 1 TABLET BY MOUTH 2 TIMES DAILY WITH A MEAL.  . tamsulosin (FLOMAX) 0.4 MG CAPS capsule Take 1 capsule (0.4 mg total) by mouth daily.  . [DISCONTINUED] Insulin Glargine (LANTUS SOLOSTAR) 100 UNIT/ML Solostar Pen Inject under skin 16 units 2x a day  . [DISCONTINUED] Albiglutide (TANZEUM) 30 MG PEN Inject under skin 30 mg weekly. (Patient not taking: Reported on 01/11/2016)  . [DISCONTINUED] amitriptyline (ELAVIL) 25 MG tablet Take 1 tablet (25 mg total) by mouth at bedtime. (Patient not taking: Reported on 01/11/2016)  . [DISCONTINUED] ondansetron (ZOFRAN) 4 MG tablet Take 1 tablet (4 mg total) by mouth every 6 (six) hours. (Patient not taking: Reported on 01/11/2016)  . [DISCONTINUED] saw palmetto 160 MG capsule Take 160 mg by mouth 2 (two) times daily.   No facility-administered encounter medications on file as of 01/11/2016.  Functional Status: In your present state of health, do you have any difficulty performing the following activities: 01/11/2016  Hearing? N  Vision? N  Difficulty concentrating or making decisions? N  Walking or climbing stairs? N  Dressing or bathing? N  Doing errands, shopping? N  Some recent data might be hidden    Fall/Depression Screening: PHQ 2/9 Scores 01/11/2016 09/08/2015 03/20/2015 02/20/2015 08/22/2014 06/13/2014  PHQ - 2 Score 0 1 0 0 0 0    Assessment:  Diabetes: Most recent A1C was 9.3% which is at not at goal of less than  7%.     Physical Activity- He is exercising more.  He is doing more cardio and lifting more weight.    Plan/Goals for Next Visit:  1.  Nathaniel [name] is going to continue to exercise 6 to 7 times a week to improve his blood sugars.   2.  He is going to continue to try to eat better to help his blood sugars remain in the range of 80 to 120 fasting.  3.  He will continue to monitor his blood sugars 3 to 4 times a day.   Next appointment to see me is: He will follow up with Nathaniel [name], PharmD on April 15, 2016 at 9 am.   Nathaniel [name], PharmD, [place] [phone]  [facility] CM Care Plan Problem One   Flowsheet Row Most Recent Value  Care Plan Problem One  Physical Activity  Role Documenting the Problem One  [place] for Problem One  Active  [facility] Long Term Goal (31-90 days)  Nathaniel [name] will continue to exercise 6 times a week for at least 30 minutes evident by patient report over the next 90 days.   [facility] Long Term Goal Start Date  01/12/16

## 2016-01-15 ENCOUNTER — Other Ambulatory Visit: Payer: Self-pay | Admitting: Medical

## 2016-01-15 ENCOUNTER — Ambulatory Visit (INDEPENDENT_AMBULATORY_CARE_PROVIDER_SITE_OTHER): Payer: 59 | Admitting: Medical

## 2016-01-15 ENCOUNTER — Encounter: Payer: Self-pay | Admitting: Medical

## 2016-01-15 VITALS — BP 100/68 | HR 71 | Temp 97.6°F | Ht 72.0 in | Wt 196.0 lb

## 2016-01-15 DIAGNOSIS — Z794 Long term (current) use of insulin: Secondary | ICD-10-CM

## 2016-01-15 DIAGNOSIS — R5383 Other fatigue: Secondary | ICD-10-CM

## 2016-01-15 DIAGNOSIS — R0683 Snoring: Secondary | ICD-10-CM

## 2016-01-15 DIAGNOSIS — E1165 Type 2 diabetes mellitus with hyperglycemia: Secondary | ICD-10-CM

## 2016-01-15 DIAGNOSIS — I10 Essential (primary) hypertension: Secondary | ICD-10-CM | POA: Diagnosis not present

## 2016-01-15 DIAGNOSIS — E118 Type 2 diabetes mellitus with unspecified complications: Secondary | ICD-10-CM | POA: Diagnosis not present

## 2016-01-15 LAB — COMPLETE METABOLIC PANEL WITH GFR
ALT: 16 U/L (ref 9–46)
AST: 24 U/L (ref 10–35)
Albumin: 4.1 g/dL (ref 3.6–5.1)
Alkaline Phosphatase: 51 U/L (ref 40–115)
BUN: 11 mg/dL (ref 7–25)
CO2: 27 mmol/L (ref 20–31)
Calcium: 9.5 mg/dL (ref 8.6–10.3)
Chloride: 99 mmol/L (ref 98–110)
Creat: 1 mg/dL (ref 0.70–1.33)
GFR, Est African American: >89 mL/min (ref >=60)
GFR, Est Non African American: 87 mL/min (ref >=60)
Glucose, Bld: 155 mg/dL — ABNORMAL HIGH (ref 65–99)
Potassium: 4 mmol/L (ref 3.5–5.3)
Sodium: 136 mmol/L (ref 135–146)
Total Bilirubin: 1 mg/dL (ref 0.2–1.2)
Total Protein: 6.5 g/dL (ref 6.1–8.1)

## 2016-01-15 LAB — CBC WITH DIFFERENTIAL/PLATELET
Basophils Absolute: 64 cells/uL (ref 0–200)
Basophils Relative: 1 %
Eosinophils Absolute: 128 cells/uL (ref 15–500)
Eosinophils Relative: 2 %
HCT: 39.6 % (ref 38.5–50.0)
Hemoglobin: 12.8 g/dL — ABNORMAL LOW (ref 13.2–17.1)
Lymphocytes Relative: 28 %
Lymphs Abs: 1792 cells/uL (ref 850–3900)
MCH: 28.1 pg (ref 27.0–33.0)
MCHC: 32.3 g/dL (ref 32.0–36.0)
MCV: 87 fL (ref 80.0–100.0)
MPV: 10 fL (ref 7.5–12.5)
Monocytes Absolute: 512 cells/uL (ref 200–950)
Monocytes Relative: 8 %
Neutro Abs: 3904 cells/uL (ref 1500–7800)
Neutrophils Relative %: 61 %
Platelets: 245 10*3/uL (ref 140–400)
RBC: 4.55 MIL/uL (ref 4.20–5.80)
RDW: 13 % (ref 11.0–15.0)
WBC: 6.4 10*3/uL (ref 3.8–10.8)

## 2016-01-15 LAB — T4, FREE: Free T4: 1.1 ng/dL (ref 0.8–1.8)

## 2016-01-15 LAB — TSH: TSH: 0.98 mIU/L (ref 0.40–4.50)

## 2016-01-15 NOTE — Patient Instructions (Addendum)
For your diabetes will get cmp and a1c today.  For fatigue will get cbc and tsh/t4 sudies.  Your bp is controlled today. Continue current bp meds.  Will refer you to pulmonologist for evaluation snoring and sleep study.  Follow up date to be determined after lab review.   Below place  March 04, 2016. Pt called back expressing that he needs sleep study by January. In my chart message sent to me he indicated that I take his sleep complaint more seriously. I placed his referral request same date of service. I understood his concern and thus no delay in placing the referral. He cancelled his scheduled appointment with pulmonology today and wanted me to expedite second request. So again placed the request immediately when staff notified me that he cancelled his current scheduled appointment. Below is my my chart response to his message.  We are working to try to get you in with specialist that can do your sleep study before the end of the year. I notified our referral staff today and she has been working diligently calling various specialist office and she will reach out to 2 other offices on Monday. She told me last 2 officeswere closed this afternoon.I placed the initial referral request to the pulmonologist on the same day of your visit with me(no delay). And by review of referral notes it looks like they contacted you on 10-30-2-17 and you were scheduled for 03-28-2016 initially. Sorry for that day. I believe specialist are usually very busy at the end of the year as many patients are trying to get studies done before January. Then saw they pushed back the appointment until 04-05-2016. Sorry for that as well.I was notified today of your concern and immediately notified staff member to try to get you in sooner. We will continue to attempt to get you in before January. If we can't find one by Monday then I will need to do some brainstorming as we have called various offices. I will ask staff to  update you on Monday. Hope you have a good weekend.  March 07, 2016   I called pt 4 times tonight to update him on our challenges thus far trying to get him in to see specialist and sleep study before the end of the year but was unable to talk with him. After he canceled his appointment with [facility] pulmonologist  we tried [facility], [facility], [facility], [facility](in [place]) and asked Silva BandyKristi to call a cardiologist who does sleep studies. But none could get him  before January. Some even later than his appointment he canceled. [facility] another clinic could see him sooner but I believe he declined since it was out of his network.   I asked Silva BandyKristi to call his original office to see if they could see him even though he canceled on 04-05-2016. They told her that appointment was already taken but they had new cancellation on 04-11-2016. I asked them to given appointment for Mr. Lynita LombardSimms. Then advised Kristi to call patient and let him know that he could take that appointment. I could write his new employer a letter explaining that his abcenses from work are necessary and that he had made these appointments month out even before being hired.  I also investigated further tonight and will see if possible [facility] might be able to see him. Will send Silva BandyKristi a message and give her the number.  Silva BandyKristi did try to contact [facility] as well  but they were out of network.  I then saw Kirsti contact pt via my chart and gave him info for his appointment with [facility] in April.(I believe pt had expressed with his new job he could not take time off to attend appointment for first 3 months).

## 2016-01-15 NOTE — Progress Notes (Signed)
Subjective:    Patient ID: Nathaniel [name], male    DOB: 07-Sep-1965, 50 y.o.   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 Eating less carbs, more protein. Trying to eat more vegetables, married-2 children. Pt enjoys exercises.  Pt states he feels ok today.  Pt states his  wife reports he stops breathing in his sleeps(10 second pause per wife). He snores a lot. He wants to be referred to specialist for eventual sleep study. Pt has been snoring for a while/years per pt. Pt does report feeling tired in am. Not well rested. He does snore as stated above.  Pt in past had only mild ldl elevation.  Pt has history of depression. Pt states his mood is good and has been for months. He got off meds and started to rely on exercise. He states exercise helps him more than any medication.   Pt seen endocrinologist. Pt last a1-c was  9.3 on  10-02-2015. He wants to get a1-c today.  Pt blood pressure is controlled today.       Review of Systems  Constitutional: Positive for fatigue. Negative for chills and fever.  HENT: Positive for congestion. Negative for ear discharge, facial swelling, postnasal drip, rhinorrhea, sinus pressure, sneezing and sore throat.        Hx of chronic allegies.  Failed various treatment, allergy injections. He states used to feeling nasal congested and declines using any medications for allergies.  Snores a lot.  Respiratory: Negative for cough, chest tightness and wheezing.   Cardiovascular: Negative for chest pain and palpitations.  Gastrointestinal: Negative for abdominal pain.  Musculoskeletal: Negative for back pain.  Neurological: Negative for dizziness and headaches.  Hematological: Negative for adenopathy. Does not bruise/bleed easily.  Psychiatric/Behavioral: Positive  for sleep disturbance. Negative for behavioral problems and confusion.    Past Medical History:  Diagnosis Date  . Depression   . Diabetes mellitus    type II  . GERD (gastroesophageal reflux disease)   . Headache(784.0)   . Hypogonadism male      Social History   Social History  . Marital status: Married    Spouse name: [name]  . Number of children: 2  . Years of education: N/A   Occupational History  .  [employer]   Social History Main Topics  . Smoking status: Never Smoker  . Smokeless tobacco: Never Used  . Alcohol use No  . Drug use: No  . Sexual activity: Not on file   Other Topics Concern  . Not on file   Social History Narrative   He lives with wife and two children.   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   Highest level of education: master degree    Past Surgical History:  Procedure Laterality Date  . NASAL SINUS SURGERY  04/1991  . TONSILLECTOMY  1972    Family History  Problem Relation Age of Onset  . Adopted: Yes  . Cancer Father 27    Prostate cancer  . Healthy Mother   . Diabetes Mellitus I Son     Allergies  Allergen Reactions  . Cymbalta [Duloxetine Hcl]     Suicidal thoughts  . Eggs Or Egg-Derived Products  childhood  . Penicillins   . Pioglitazone Other (See Comments)      headache  . Promethazine Hcl Nausea And Vomiting       . Reglan [Metoclopramide] Other (See Comments)    Jittery, increased anxiety    Current Outpatient Prescriptions on File Prior to Visit  Medication Sig Dispense Refill  . Alcohol Swabs (ALCOHOL PREPS) PADS Use alcohol pads to clean site to be used to check blood sugar. 100 each 11  . BAYER CONTOUR NEXT TEST test strip USE TO TEST BLOOD SUGAR 4 TIMES DAILY AS INSTRUCTED 125 each 5  . Blood Glucose Monitoring Suppl (BAYER CONTOUR NEXT USB MONITOR) W/DEVICE KIT Use kit as directed 1 kit 0  . glucagon (GLUCAGON EMERGENCY) 1  MG injection Inject 1 mg into the muscle once as needed. 1 each 3  . Insulin Glargine (LANTUS SOLOSTAR) 100 UNIT/ML Solostar Pen Inject 18 units in the morning and 14 units in the evening    . insulin lispro (HUMALOG KWIKPEN) 100 UNIT/ML KiwkPen Inject 0.06-0.1 mLs (6-10 Units total) into the skin 3 (three) times daily. 15 mL 5  . lisinopril (PRINIVIL,ZESTRIL) 2.5 MG tablet TAKE 1 TABLET BY MOUTH DAILY. 90 tablet 3  . metFORMIN (GLUCOPHAGE) 1000 MG tablet TAKE 1 TABLET BY MOUTH 2 TIMES DAILY WITH A MEAL. 180 tablet 3  . tamsulosin (FLOMAX) 0.4 MG CAPS capsule Take 1 capsule (0.4 mg total) by mouth daily. 30 capsule 0   No current facility-administered medications on file prior to visit.     BP 100/68 (BP Location: Left Arm, Patient Position: Sitting)   Pulse 71   Temp 97.6 F (36.4 C) (Oral)   Ht 6' (1.829 m)   Wt 196 lb (88.9 kg)   SpO2 98%   BMI 26.58 kg/m      Objective:   Physical Exam  General- No acute distress. Pleasant patient. Neck- Full range of motion, no jvd Lungs- Clear, even and unlabored. Heart- regular rate and rhythm. Neurologic- CNII- XII grossly intact.  Abdomen-soft, nt, nd, +bs.      Assessment & Plan:  For your diabetes will get cmp and a1c today.  For fatigue will get cbc and tsh/t4 sudies.  Your bp is controlled today. Continue current bp meds.  Will refer you to pulmonologist for evaluation snoring and sleep study.  Follow up date to be determined after lab review.  Glendel Jaggers, Percell Miller, PA-C

## 2016-01-15 NOTE — Progress Notes (Signed)
Pre visit review using our clinic review tool, if applicable. No additional management support is needed unless otherwise documented below in the visit note. 

## 2016-01-16 ENCOUNTER — Telehealth: Payer: Self-pay | Admitting: Medical

## 2016-01-16 ENCOUNTER — Encounter: Payer: Self-pay | Admitting: Medical

## 2016-01-16 DIAGNOSIS — E118 Type 2 diabetes mellitus with unspecified complications: Secondary | ICD-10-CM

## 2016-01-16 DIAGNOSIS — Z1211 Encounter for screening for malignant neoplasm of colon: Secondary | ICD-10-CM

## 2016-01-16 DIAGNOSIS — D649 Anemia, unspecified: Secondary | ICD-10-CM

## 2016-01-16 NOTE — Telephone Encounter (Signed)
a1c order placed.please run. Labs drawn on Friday.

## 2016-01-18 ENCOUNTER — Encounter: Payer: Self-pay | Admitting: Gastroenterology

## 2016-01-18 NOTE — Telephone Encounter (Signed)
Would you give me update on a1-c result make sure that it gets done. Thanks Ramon DredgeEdward

## 2016-01-18 NOTE — Telephone Encounter (Signed)
Checking status of results of A1C

## 2016-01-19 ENCOUNTER — Encounter: Payer: Self-pay | Admitting: Medical

## 2016-01-19 ENCOUNTER — Telehealth: Payer: Self-pay

## 2016-01-19 NOTE — Telephone Encounter (Signed)
Patient called for result of A1c. Test has not been resulted yet. Spoke to lab who states when will have results by aftyernpon tomorrow. Patient notified.

## 2016-01-19 NOTE — Telephone Encounter (Signed)
Ok thanks 

## 2016-01-20 ENCOUNTER — Encounter: Payer: Self-pay | Admitting: Medical

## 2016-01-20 LAB — HEMOGLOBIN A1C
Hgb A1c MFr Bld: 8.2 % — ABNORMAL HIGH (ref <5.7)
Mean Plasma Glucose: 189 mg/dL

## 2016-01-25 NOTE — Progress Notes (Signed)
Pt has seen results on MyChart and message also sent for patient to call back if any questions.

## 2016-02-09 ENCOUNTER — Encounter: Payer: Self-pay | Admitting: Pulmonary Disease

## 2016-02-09 ENCOUNTER — Encounter: Payer: Self-pay | Admitting: Medical

## 2016-02-16 MED FILL — TAMSULOSIN HCL 0.4 MG CAP: 0.4 | 30 days supply | Qty: 30 | Fill #2

## 2016-02-19 MED FILL — LANTUS SOLOSTAR 100 UNITS/M: 100 | 47 days supply | Qty: 15 | Fill #3

## 2016-02-22 ENCOUNTER — Encounter: Payer: Self-pay | Admitting: Internal Medicine

## 2016-02-22 ENCOUNTER — Ambulatory Visit (INDEPENDENT_AMBULATORY_CARE_PROVIDER_SITE_OTHER): Payer: 59 | Admitting: Internal Medicine

## 2016-02-22 VITALS — BP 100/60 | HR 75 | Wt 193.0 lb

## 2016-02-22 DIAGNOSIS — E1165 Type 2 diabetes mellitus with hyperglycemia: Secondary | ICD-10-CM

## 2016-02-22 DIAGNOSIS — Z794 Long term (current) use of insulin: Secondary | ICD-10-CM

## 2016-02-22 MED FILL — CONTOUR NEXT STRIPS: 37 days supply | Qty: 150 | Fill #2

## 2016-02-22 NOTE — Progress Notes (Addendum)
Patient ID: Nathaniel [name], male   DOB: 1965/12/31, 50 y.o.   MRN: [id number]  HPI: Nathaniel [name] is a 50 y.o.-year-old man, returning for f/u for DM2, dx 2001, insulin-dependent since 2010, uncontrolled, without complications. Last visit 2 mo ago.  He was on a pump: Medtronic 630G >> now off if he decided that he does not want the pump anymore.  He started to exercise 5-6x a week. Running on the treadmill for 45 min at 5 mi/h.   He is again stressed at work >> will go to previous job.  Last hemoglobin A1c was: Lab Results  Component Value Date   HGBA1C 8.2 (H) 01/15/2016   HGBA1C 9.3 10/02/2015   HGBA1C 7.6 (H) 05/29/2015  11/08/2013: 9.5% 04/11/2013: 9.8%  Pt was on an insulin pump before, but would not want to restart.  Now on: - Metformin 1000 mg 2x a day - Lantus 14 units in am and 14 units in hs  - Humalog as follows: - Insulin to carb ratio: 1:7 - target: 120 - Insulin sensitivity factor: 30 Stopped Tanzeum b/c N/V >> was in UC x2.  Pt checks his sugars 2-3x a day and they are: - am:71-133 >> 68-232, 266 >> 101-204, 221, 243 >> 103, 132,195 - 2h after b'fast: 67x1, 126-193, 245, 299 today >> 87-256 >> 194-272 >> 83-309 >> 172 - before lunch: 91 >> 119-202 >> 153-197 >> 90-214 >> 128 -225 >> >> 159-383 >> 188, 222, 279, 394 - 2h after lunch: 119-256 >> 140-269 >> 124-213 >> 162-263, 287 >> 181-279 >> 111, 136, 137, 355, 374 - before dinner:143-189 >> 113-237 >> 56, 63-160, 228 >> 129-251 >> >> 124-302  >> 164, 183, 311 - 2h after dinner:149 >> 141-203 >> 126-379 >> 127-219 >> 83-254, 346 >> n/c >> 103, 183, 289 - bedtime: 160-180 >> n/c >> 357 >> see above >> 116-224 >> see above -  Nighttime: 43-83 >> 168-277 >> 214, 183 Lowest: 67x1 >> 43, 48, 53 >> 101 >> 83 >> 103. Highest: 370 >> 300s (missed Lantus) >> 394  Pt's meals are: - Breakfast: cereal + 1% milk, with protein bar - Lunch: sandwich - Dinner: eating out or eating in - Snacks: 1-2 a day  - no CKD,  last BUN/creatinine:  Lab Results  Component Value Date   BUN 11 01/15/2016   CREATININE 1.00 01/15/2016  He is on Lisinopril. - last set of lipids: Lab Results  Component Value Date   CHOL 180 08/14/2015   HDL 47.40 08/14/2015   LDLCALC 105 (H) 08/14/2015   TRIG 134.0 08/14/2015   CHOLHDL 4 08/14/2015  - last eye exam was 04/07/2015. No DR.  - no numbness and tingling in his feet.   I reviewed pt's medications, allergies, PMH, social hx, family hx, and changes were documented in the history of present illness. Otherwise, unchanged from my initial visit note.   ROS: Constitutional: no weight gain/loss,no fatigue, no subjective hyperthermia/hypothermia Eyes: no blurry vision, no xerophthalmia ENT: no sore throat, no nodules palpated in throat, no dysphagia/odynophagia, no hoarseness Cardiovascular: no CP/SOB/palpitations/leg swelling Respiratory: no cough/no SOB Gastrointestinal: no N/V/D/C Musculoskeletal: no muscle aches/no joint aches Skin: no rashes Neurological: no tremors/numbness/tingling/dizziness Foot exam performed today: Diabetic Foot Exam - Simple   Simple Foot Form Diabetic Foot exam was performed with the following findings:  Yes 02/22/2016 10:10 AM  Visual Inspection See comments:  Yes Sensation Testing Intact to touch and monofilament testing bilaterally:  Yes Pulse Check Posterior  Tibialis and Dorsalis pulse intact bilaterally:  Yes Comments No ulcerations, sores, cuts. + Hallux onycodystrophy B.      PE: BP 100/60   Pulse 75   Wt 193 lb (87.5 kg)   BMI 26.18 kg/m  Body mass index is 26.18 kg/m.  Wt Readings from Last 3 Encounters:  02/22/16 193 lb (87.5 kg)  01/15/16 196 lb (88.9 kg)  01/12/16 195 lb (88.5 kg)   Constitutional: normal weight, in NAD Eyes: PERRLA, EOMI, no exophthalmos ENT: moist mucous membranes, no thyromegaly, no cervical lymphadenopathy Cardiovascular: RRR, No MRG Respiratory: CTA B Gastrointestinal: abdomen soft, NT,  ND, BS+ Musculoskeletal: no deformities, strength intact in all 4 Skin: moist, warm, no rashes Neurological: no tremor with outstretched hands, DTR normal in all 4  ASSESSMENT: 1. DM2, insulin-dependent, uncontrolled, without complications - Received records with C-peptide of 3.67 (0.8-3.9), from 06/16/2005 ([facility]). No associated glucose reported. - son with DM1 (not on a pump)  PLAN:  1. Patient with long-standing, uncontrolled diabetes. He was doing much better on an insulin pump, however, he decided that that he would want to switch back to a basal-bolus regimen. Sugars increased afterwards >> we tried to add Tanzeum, but he stopped as he developed N/V. Sugars now again variable with more hyperglycemia in last mo 2/2 stress at work >> will again change jobs starting 03/2016. -  We will continue metformin for now and will increase Lantus in am - I advised him to: Patient Instructions  Please increase Lantus: - 16 units in am and 14 units at night.  Continue: - Metformin 1000 mg 2x a day - Humalog as follows: - Insulin to carb ratio: 1:7 - target: 120 - Insulin sensitivity factor: 30  Please return in 3 months with your sugar log.   - continue checking sugars at different times of the day - check 4 times a day, rotating checks - UTD with yearly eye exams - most recent HbA1c was 8.3% (lower) - Return to clinic in 3 mo with sugar log   Nathaniel Pavlovristina Jordayn Mink, MD PhD [facility]

## 2016-02-22 NOTE — Patient Instructions (Addendum)
Please increase Lantus: - 16 units in am and 14 units at night.  Continue: - Metformin 1000 mg 2x a day - Humalog as follows: - Insulin to carb ratio: 1:7 - target: 120 - Insulin sensitivity factor: 30  Please return in 3 months with your sugar log.

## 2016-02-23 ENCOUNTER — Other Ambulatory Visit: Payer: Self-pay | Admitting: Internal Medicine

## 2016-02-23 DIAGNOSIS — E1165 Type 2 diabetes mellitus with hyperglycemia: Secondary | ICD-10-CM

## 2016-02-23 DIAGNOSIS — Z794 Long term (current) use of insulin: Principal | ICD-10-CM

## 2016-03-04 ENCOUNTER — Telehealth: Payer: Self-pay

## 2016-03-04 ENCOUNTER — Encounter: Payer: Self-pay | Admitting: Medical

## 2016-03-04 ENCOUNTER — Telehealth: Payer: Self-pay | Admitting: Medical

## 2016-03-04 NOTE — Telephone Encounter (Signed)
Pt expresses desire to get sleep study done asap. Would you looks at his notes. I appears his appointment fell through.  I not sure what transpired. I put in the referral in October. Why such a long delay?  Please let me know what you find out. Can you call around to see if other pulmonologist will see him

## 2016-03-04 NOTE — Telephone Encounter (Signed)
When was pt notified of his appointment with pulmonologist  in January? Did he find out today.

## 2016-03-04 NOTE — Telephone Encounter (Signed)
I received [facility] and she states that the patient was rude to the staff when he called on the phone. Shantel stated that two people had looked at the schedule to check and see if there had been any cancellations so the patient could be seen sooner and they did not have any earlier appointments available.   The patient told Loletha CarrowShantel "are you going to pay my bills if I die since I am not able to get in for an appointment until January". The patient cancelled the appointment and told Shantel that he was going to a different office so the referral was cancelled.

## 2016-03-04 NOTE — Telephone Encounter (Signed)
I [facility] back [facility] and they can see pt next week for sleep consult. I have faxed referral to her directly at [phone]. I called pt at [phone] and notified him that Okey DupreRose will be contacting him to schedule for next week.

## 2016-03-04 NOTE — Telephone Encounter (Signed)
Pt was originally contacted and scheduled for appt with [facility] for sleep consult on 01/18/16. That appt was scheduled for 03/28/16. Pt was contacted on 02/10/16 to reschedule (I am assuming change in provider schedule). Pt was rescheduled for 04/05/16. Pt contacted [facility] today requesting a sooner appt and there was not an earlier appt available. Pt got upset and cancelled the appt (see other tel note regarding call between [facility] office and our CMA).   I have left msg for Rose at [facility] to see if they can see pt for sleep consult prior to 03/20/16.  I called [facility] but [facility]. I can try them again Monday.  I called [facility] in [place] and left msg for Lauren site manager for Dr. Theressa MillardJames Osborne to see if they can see pt for sleep consult prior to 03/20/16.  I called [facility] and got the answering service as the office closed [facility] today. I can try them again Monday.

## 2016-03-04 NOTE — Telephone Encounter (Signed)
Pt called back and stated that [facility] is not in network with insurance and he is not interested in going to their facility. I advised pt [facility] could consult mid January and he did not comment. I will f/u again with [facility] on Monday and see if they are able to see pt prior to year end.

## 2016-03-04 NOTE — Telephone Encounter (Signed)
Patient is calling to inform Nathaniel [name] that he canceled his sleep study that was scheduled for 04/05/16 because he would like to get in sooner. He would like to know if he could get a referral to another office to get in sooner. He states he feels like he does have sleep apnea and he doesn't want to die in his sleep. Please advise.    Phone: [phone]

## 2016-03-04 NOTE — Telephone Encounter (Signed)
Will you give me an update on Monday regarding  getting him in for sleep apnea /sleep study evaluation?

## 2016-03-04 NOTE — Telephone Encounter (Signed)
[facility] called back, they are in network but cannot see pt until February

## 2016-03-07 NOTE — Telephone Encounter (Signed)
[facility] cannot see pt before the end of the year. They are booked out to mid-late January. I have exhausted multiple options for the patient. None of the offices contacted are able to see him before the end of this year. Do you want to notify the pt?

## 2016-03-07 NOTE — Telephone Encounter (Signed)
Nathaniel [name],  Another clinic your could try [facility]. There number is [phone]. Would you let them know pt concern about his snoring and his needs to get study done before the end of the year. We have exhausted all options. Hopefully in network and maybe they have opening before end of the year. Maybe they could see him this week initial visit. Sleep study next week????

## 2016-03-07 NOTE — Telephone Encounter (Signed)
Called again later after hours cell and home phone no answer.

## 2016-03-07 NOTE — Telephone Encounter (Signed)
How about place in [place]. I think I referred someone quickly to [facility]. Number is [phone].

## 2016-03-07 NOTE — Telephone Encounter (Signed)
[facility] can schedule the appointment 1 more time if you are willing but if the patient cancels the appointment again their office will not see the patient.   Please advise.

## 2016-03-07 NOTE — Telephone Encounter (Signed)
[facility] cannot complete consult & sleep study before 12/31. I called [facility] - the sleep center has a few openings for sleep study 12/28-12/29 if [facility] can do consult prior. LM for Bonita QuinLinda, ref coord, at [facility] to see if they can see pt before then & placed in her workqueue. I'm waiting for her response.

## 2016-03-07 NOTE — Telephone Encounter (Signed)
I called pt cell phone and home phone. No answer. Called pt after hours to discuss out attempts to re- refer for sleep study which he most recently canceled.

## 2016-03-07 NOTE — Telephone Encounter (Signed)
Called [facility] in [place] (Hassie BruceJohn [place], MD). They are booked until mid-January as well.

## 2016-03-08 NOTE — Telephone Encounter (Signed)
Nathaniel [name],  Pt is going to wait until April/May. I will notify [facility] to reschedule his appt.

## 2016-03-08 NOTE — Telephone Encounter (Signed)
[facility] would be out of network

## 2016-03-11 NOTE — Telephone Encounter (Signed)
Would you mind sending letter to patient with the same information you sent in on my chart regarding his appointment with [facility] in April. Document that it was sent or ask Ebony manner that it should be sent.

## 2016-03-15 ENCOUNTER — Encounter: Payer: Self-pay | Admitting: Medical

## 2016-03-15 NOTE — Telephone Encounter (Signed)
Letter mailed to pt.  

## 2016-03-16 ENCOUNTER — Encounter: Payer: Self-pay | Admitting: Medical

## 2016-03-16 ENCOUNTER — Encounter: Payer: 59 | Admitting: Gastroenterology

## 2016-03-16 NOTE — Telephone Encounter (Signed)
Per Ramon DredgeEdward scheduled appt for 12/29 8:30am, LM to notify pt of appt and request he call back

## 2016-03-18 ENCOUNTER — Ambulatory Visit (INDEPENDENT_AMBULATORY_CARE_PROVIDER_SITE_OTHER): Payer: 59 | Admitting: Medical

## 2016-03-18 ENCOUNTER — Encounter: Payer: Self-pay | Admitting: Medical

## 2016-03-18 VITALS — HR 71 | Temp 98.0°F | Ht 72.0 in | Wt 196.2 lb

## 2016-03-18 DIAGNOSIS — F411 Generalized anxiety disorder: Secondary | ICD-10-CM

## 2016-03-18 MED ORDER — SERTRALINE HCL 25 MG PO TABS
25.0000 mg | ORAL_TABLET | Freq: Every day | ORAL | 0 refills | Status: DC
Start: 2016-03-18 — End: 2016-05-23

## 2016-03-18 MED FILL — SERTRALINE HCL 25 MG TABLET: 25 | 22 days supply | Qty: 30 | Fill #0

## 2016-03-18 NOTE — Progress Notes (Signed)
Pre visit review using our clinic tool,if applicable. No additional management support is needed unless otherwise documented below in the visit note.  

## 2016-03-18 NOTE — Patient Instructions (Addendum)
For your generalized anxiety will rx low dose sertraline 25 mg a day. Low dose to avoid side effects. In about 2 weeks if anxiety improved but not adequate then could increase to 50 mg. Please update us in 2 weeks and could increase rx dosage to 50 mg.   I offered buspar as well and this may be add on in near future if needed.  Follow up in 3 months or sooner if needed/if your new employer work permits.

## 2016-03-18 NOTE — Progress Notes (Signed)
Subjective:    Patient ID: Nathaniel [name], male    DOB: 11/12/1965, 50 y.o.   MRN: [id number]  HPI   Pt in for some anxiety. Pt has new job and indicates recent job stress. He is about to change jobs and expects job to be less stressful and demanding.   Past 2-3 months has been worried about almost everything. He has been snapping at a lot people angry and having short temper. No panic attack but constant anxiety. He worries and has night mares at night.   Pt states in past when in 34's struggled with anxiety. Pt states he struggles with change.   Pt was on lexapro in the past for depression. He states it helped for a while and then wore off. He used that 4-5 years ago.  Pt states recently his mood is not that depressed.(more anxious)  Pt with his new job does not know exactly how his schedule will work.  Pt reports in past if med has drowsy side effect he will likely get.   Review of Systems  Constitutional: Negative for chills, fatigue and fever.  Respiratory: Negative for cough, chest tightness, shortness of breath and wheezing.   Cardiovascular: Negative for chest pain and palpitations.  Gastrointestinal: Negative for abdominal pain.  Musculoskeletal: Negative for back pain.  Skin: Negative for rash.  Neurological: Negative for dizziness, light-headedness and headaches.  Hematological: Negative for adenopathy. Does not bruise/bleed easily.  Psychiatric/Behavioral: Positive for sleep disturbance. Negative for behavioral problems, confusion, dysphoric mood and suicidal ideas. The patient is nervous/anxious.        Anxiety bothers his sleep.   Past Medical History:  Diagnosis Date  . Depression   . Diabetes mellitus    type II  . GERD (gastroesophageal reflux disease)   . Headache(784.0)   . Hypogonadism male      Social History   Social History  . Marital status: Married    Spouse name: [name]  . Number of children: 2  . Years of education: N/A   Occupational  History  .  [employer]   Social History Main Topics  . Smoking status: Never Smoker  . Smokeless tobacco: Never Used  . Alcohol use No  . Drug use: No  . Sexual activity: Not on file   Other Topics Concern  . Not on file   Social History Narrative   He lives with wife and two children.   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   Highest level of education: master degree    Past Surgical History:  Procedure Laterality Date  . NASAL SINUS SURGERY  04/1991  . TONSILLECTOMY  1972    Family History  Problem Relation Age of Onset  . Adopted: Yes  . Cancer Father 59    Prostate cancer  . Healthy Mother   . Diabetes Mellitus I Son     Allergies  Allergen Reactions  . Cymbalta [Duloxetine Hcl]     Suicidal thoughts  . Eggs Or Egg-Derived Products     childhood  . Penicillins   . Pioglitazone Other (See Comments)      headache  . Promethazine Hcl Nausea And Vomiting       . Reglan [Metoclopramide] Other (See Comments)    Jittery, increased anxiety    Current Outpatient Prescriptions on File Prior to Visit  Medication Sig Dispense Refill  . Alcohol Swabs (ALCOHOL PREPS) PADS Use alcohol  pads to clean site to be used to check blood sugar. 100 each 11  . BAYER CONTOUR NEXT TEST test strip USE TO TEST BLOOD SUGAR 4 TIMES DAILY AS INSTRUCTED 125 each 5  . Blood Glucose Monitoring Suppl (BAYER CONTOUR NEXT USB MONITOR) W/DEVICE KIT Use kit as directed 1 kit 0  . glucagon (GLUCAGON EMERGENCY) 1 MG injection Inject 1 mg into the muscle once as needed. 1 each 3  . Insulin Glargine (LANTUS SOLOSTAR) 100 UNIT/ML Solostar Pen Inject 16 units in the morning and 14 units in the evening     . insulin lispro (HUMALOG KWIKPEN) 100 UNIT/ML KiwkPen Inject 0.06-0.1 mLs (6-10 Units total) into the skin 3 (three) times daily. 15 mL 5  . lisinopril (PRINIVIL,ZESTRIL) 2.5 MG tablet TAKE 1 TABLET BY MOUTH DAILY. 90  tablet 3  . metFORMIN (GLUCOPHAGE) 1000 MG tablet TAKE 1 TABLET BY MOUTH 2 TIMES DAILY WITH A MEAL. 180 tablet 3  . tamsulosin (FLOMAX) 0.4 MG CAPS capsule Take 1 capsule (0.4 mg total) by mouth daily. (Patient not taking: Reported on 03/18/2016) 30 capsule 0   No current facility-administered medications on file prior to visit.     Pulse 71   Temp 98 F (36.7 C) (Oral)   Ht 6' (1.829 m)   Wt 196 lb 3.2 oz (89 kg)   SpO2 100%   BMI 26.61 kg/m       Objective:   Physical Exam  General Mental Status- Alert. General Appearance- Not in acute distress. Pleasant today but expresses anxiety.  Skin General: Color- Normal Color. Moisture- Normal Moisture.   Chest and Lung Exam Auscultation: Breath Sounds:-Normal.  Cardiovascular Auscultation:Rythm- Regular. Murmurs & Other Heart Sounds:Auscultation of the heart reveals- No Murmurs.    Neurologic Cranial Nerve exam:- CN III-XII intact(No nystagmus), symmetric smile. Strength:- 5/5 equal and symmetric strength both upper and lower extremities.      Assessment & Plan:  For your generalized anxiety will rx low dose sertraline 25 mg a day. Low dose to avoid side effects. In about 2 weeks if anxiety improved but not adequate then could increase to 50 mg. Please update [place] in 2 weeks and could increase rx dosage to 50 mg.   I offered buspar as well and this may be add on in near future if needed.  Follow up in 3 months or sooner if needed/if you new employer work permits.  Lanelle Lindo, Percell Miller, PA-C

## 2016-03-28 ENCOUNTER — Institutional Professional Consult (permissible substitution): Payer: 59 | Admitting: Pulmonary Disease

## 2016-04-04 MED FILL — LISINOPRIL 2.5 MG TABLET: 2.5 | 90 days supply | Qty: 90 | Fill #2

## 2016-04-05 ENCOUNTER — Institutional Professional Consult (permissible substitution): Payer: 59 | Admitting: Pulmonary Disease

## 2016-04-05 ENCOUNTER — Telehealth: Payer: 59 | Admitting: Nurse Practitioner

## 2016-04-05 DIAGNOSIS — J0101 Acute recurrent maxillary sinusitis: Secondary | ICD-10-CM | POA: Diagnosis not present

## 2016-04-05 MED ORDER — DOXYCYCLINE HYCLATE 100 MG PO TABS: 100.0000 mg | ORAL_TABLET | Freq: Two times a day (BID) | ORAL | 0 refills | Status: DC

## 2016-04-05 NOTE — Progress Notes (Signed)

## 2016-04-11 ENCOUNTER — Institutional Professional Consult (permissible substitution): Payer: 59 | Admitting: Internal Medicine

## 2016-04-13 MED FILL — LANTUS SOLOSTAR 100 UNITS/M: 100 | 47 days supply | Qty: 15 | Fill #4

## 2016-04-15 ENCOUNTER — Ambulatory Visit: Payer: 59 | Admitting: Pharmacist

## 2016-04-15 ENCOUNTER — Ambulatory Visit: Payer: Self-pay | Admitting: Pharmacist

## 2016-04-19 ENCOUNTER — Institutional Professional Consult (permissible substitution): Payer: 59 | Admitting: Pulmonary Disease

## 2016-04-22 ENCOUNTER — Other Ambulatory Visit: Payer: Self-pay | Admitting: Pharmacist

## 2016-04-27 ENCOUNTER — Other Ambulatory Visit: Payer: Self-pay

## 2016-04-27 ENCOUNTER — Telehealth: Payer: 59 | Admitting: Nurse Practitioner

## 2016-04-27 ENCOUNTER — Encounter: Payer: Self-pay | Admitting: Internal Medicine

## 2016-04-27 DIAGNOSIS — R52 Pain, unspecified: Secondary | ICD-10-CM | POA: Diagnosis not present

## 2016-04-27 MED ORDER — GLUCOSE BLOOD VI STRP: ORAL_STRIP | 5 refills | Status: DC

## 2016-04-27 MED ORDER — OSELTAMIVIR PHOSPHATE 75 MG PO CAPS: 75.0000 mg | ORAL_CAPSULE | Freq: Two times a day (BID) | ORAL | 0 refills | Status: DC

## 2016-04-27 MED ORDER — FREESTYLE LANCETS MISC: 5 refills | Status: DC

## 2016-04-27 MED ORDER — FREESTYLE LITE DEVI: 0 refills | Status: DC

## 2016-04-27 MED FILL — FREESTYLE LITE METER: 30 days supply | Qty: 1 | Fill #0

## 2016-04-27 MED FILL — FREESTYLE LITE TEST STRIP: 75 days supply | Qty: 300 | Fill #0

## 2016-04-27 MED FILL — OSELTAMIVIR PHOS 75 MG CAP: 75 | 5 days supply | Qty: 10 | Fill #0

## 2016-04-27 MED FILL — FREESTYLE LANCETS: 75 days supply | Qty: 300 | Fill #0

## 2016-04-27 NOTE — Progress Notes (Signed)

## 2016-04-27 NOTE — Addendum Note (Signed)
Addended by: Bennie PieriniMARTIN, MARY-MARGARET on: 04/27/2016 08:52 AM   Modules accepted: Orders

## 2016-05-01 ENCOUNTER — Encounter: Payer: Self-pay | Admitting: Pharmacist

## 2016-05-01 NOTE — Patient Outreach (Signed)
[facility] [place]) Care Management  [place]  04/22/16  Nathaniel [name] [birth year] [id number]  Subjective: Patient presents today for 3 month diabetes follow-up as part of the employer-sponsored Link to Wellness program.  Current diabetes regimen includes Lantus, Humalog, and metformin.  Patient also continues on daily lisinopril.  Most recent MD follow-up was 02/2016.  No med changes or major health changes at this time. Patient does not seem very interested in being at the link to wellness appointment today.   Patient reported dietary habits: Eats 3 small meals/day.  Reports he often doesn't have time to eat and often doesn't eat very much Breakfast:granola bar Lunch:chips Dinner:sandwhich Snacks:only if CBG low  Patient reported exercise habits: weights and treadmill for 45 minutes for 4-5 times per week  Patient reports hypoglycemic events 1-2 times per month Patient reports nocturia 1 time/night.  Patient denies pain/burning upon urination.  Patient denies neuropathy. Patient denies visual changes. Last vision exam 04/03/15 Patient denies self foot exams. Reports no problems last time they were checked.   Patient reported self monitored blood glucose frequency 1-2 times per day Home CBG: 14 day average 227 mg/dL.  Reports most values 180 to low 200s.   Objective:  Lab Results  Component Value Date   HGBA1C 8.2 (H) 01/15/2016   There were no vitals filed for this visit.  Lipid Panel     Component Value Date/Time   CHOL 180 08/14/2015 0849   TRIG 134.0 08/14/2015 0849   HDL 47.40 08/14/2015 0849   CHOLHDL 4 08/14/2015 0849   VLDL 26.8 08/14/2015 0849   LDLCALC 105 (H) 08/14/2015 0849     Encounter Medications: Outpatient Encounter Prescriptions as of 04/22/2016  Medication Sig  . Alcohol Swabs (ALCOHOL PREPS) PADS Use alcohol pads to clean site to be used to check blood sugar.  Marland Kitchen glucagon (GLUCAGON EMERGENCY) 1 MG injection Inject 1 mg into the  muscle once as needed.  . Insulin Glargine (LANTUS SOLOSTAR) 100 UNIT/ML Solostar Pen Inject 14 Units into the skin 2 (two) times daily.   . insulin lispro (HUMALOG KWIKPEN) 100 UNIT/ML KiwkPen Inject 0.06-0.1 mLs (6-10 Units total) into the skin 3 (three) times daily.  Marland Kitchen lisinopril (PRINIVIL,ZESTRIL) 2.5 MG tablet TAKE 1 TABLET BY MOUTH DAILY.  . metFORMIN (GLUCOPHAGE) 1000 MG tablet TAKE 1 TABLET BY MOUTH 2 TIMES DAILY WITH A MEAL.  . [DISCONTINUED] BAYER CONTOUR NEXT TEST test strip USE TO TEST BLOOD SUGAR 4 TIMES DAILY AS INSTRUCTED  . [DISCONTINUED] Blood Glucose Monitoring Suppl (BAYER CONTOUR NEXT USB MONITOR) W/DEVICE KIT Use kit as directed  . doxycycline (VIBRA-TABS) 100 MG tablet Take 1 tablet (100 mg total) by mouth 2 (two) times daily. 1 po bid (Patient not taking: Reported on 04/22/2016)  . sertraline (ZOLOFT) 25 MG tablet Take 1 tablet (25 mg total) by mouth at bedtime. In two weeks pt may double his dose and take 50 mg q day. (Patient not taking: Reported on 04/22/2016)  . tamsulosin (FLOMAX) 0.4 MG CAPS capsule Take 1 capsule (0.4 mg total) by mouth daily. (Patient not taking: Reported on 03/18/2016)   No facility-administered encounter medications on file as of 04/22/2016.     Functional Status: In your present state of health, do you have any difficulty performing the following activities: 01/11/2016  Hearing? N  Vision? N  Difficulty concentrating or making decisions? N  Walking or climbing stairs? N  Dressing or bathing? N  Doing errands, shopping? N  Some recent data might  be hidden    Fall/Depression Screening: PHQ 2/9 Scores 01/11/2016 09/08/2015 03/20/2015 02/20/2015 08/22/2014 06/13/2014  PHQ - 2 Score 0 1 0 0 0 0     Assessment:  Diabetes: Most recent A1C was 8.2% which is at above goal of less than 7%.  Patient is not currently on aspirin or statin.    Plan/Goals for Next Visit: Discussed signs/symptoms/treatment of hypoglycemia Discussed importance of eating  regular meals with diabetes and low carbohydrate diet.  Discussed increasing exercise and patient has set goal to increase exercise.  Next appointment to see me is: Patient will transition to new employee sponsored diabetes program called Shirlean Schlein, PharmD [facility] PGY2 Pharmacy Resident [phone]  [facility] CM Care Plan Problem One   Flowsheet Row Most Recent Value  Care Plan Problem One  Physical Activity  Role Documenting the Problem One  Clinical Pharmacist  Care Plan for Problem One  Active  [facility] Long Term Goal (31-90 days)  [name] will continue to exercise 6 times a week for at least 30 minutes evident by patient report over the next 90 days.   [facility] Long Term Goal Start Date  01/12/16  Interventions for Problem One Long Term Goal  Counseled on importance of exercise in management of diabetes

## 2016-05-23 ENCOUNTER — Ambulatory Visit (INDEPENDENT_AMBULATORY_CARE_PROVIDER_SITE_OTHER): Payer: 59 | Admitting: Internal Medicine

## 2016-05-23 ENCOUNTER — Encounter: Payer: Self-pay | Admitting: Internal Medicine

## 2016-05-23 VITALS — BP 102/68 | HR 88 | Ht 72.0 in | Wt 190.0 lb

## 2016-05-23 DIAGNOSIS — Z794 Long term (current) use of insulin: Secondary | ICD-10-CM

## 2016-05-23 DIAGNOSIS — E1165 Type 2 diabetes mellitus with hyperglycemia: Secondary | ICD-10-CM

## 2016-05-23 MED ORDER — METFORMIN HCL ER 500 MG PO TB24: 2000.0000 mg | ORAL_TABLET | Freq: Every day | ORAL | 3 refills | Status: DC

## 2016-05-23 MED ORDER — DULAGLUTIDE 0.75 MG/0.5ML ~~LOC~~ SOAJ: SUBCUTANEOUS | 5 refills | Status: DC

## 2016-05-23 NOTE — Patient Instructions (Addendum)
Please change to Metformin ER 2000 mg with dinner.  Please start Trulicity 0.75 mg weekly.  Please continue: - Humalog as follows  - Insulin to carb ratio: 1:7 - target: 120 - Insulin sensitivity factor: 30  Please return in 3 months with your sugar log.

## 2016-05-23 NOTE — Progress Notes (Signed)
Patient ID: Nathaniel [name], male   DOB: 1965-06-15, 51 y.o.   MRN: [id number]  HPI: Nathaniel [name] is a 51 y.o.-year-old man, returning for f/u for DM2, dx 2001, insulin-dependent since 2010, uncontrolled, without complications. Last visit 3 mo ago.  He was on a pump: Medtronic 630G >> came off and decided that he does not want the pump anymore.  Before last visit, he started to exercise 5-6x a week - running on the treadmill for 45 min at 5-5.5 mi/h + weights >> but not in last 3 weeks as he was too tired. He stops breathing at night and has a sleep study coming up.  He started a new job 2.5 mo ago. He feels that this is less stressful than his previous one.   Last hemoglobin A1c was: Lab Results  Component Value Date   HGBA1C 8.2 (H) 01/15/2016   HGBA1C 9.3 10/02/2015   HGBA1C 7.6 (H) 05/29/2015  11/08/2013: 9.5% 04/11/2013: 9.8%  Now on: - Metformin 1000 mg 2x a day >> forgets doses - Lantus 14 >> 16 units in am and 14 >> 16 units in hs  - Humalog as follows (5-6 units per meal!): - Insulin to carb ratio: 1:7 - target: 120 - Insulin sensitivity factor: 30 Stopped Tanzeum b/c N/V >> was in UC x2.  Pt checks his sugars 2-3x a day and they are: - am: 68-232, 266 >> 101-204, 221, 243 >> 103, 132,195 >> 100 with Metformin at night, 190 w/o Metformin at night  - 2h after b'fast:  87-256 >> 194-272 >> 83-309 >> 172 >> n/c - before lunch: 153-197 >> 90-214 >> 128 -225 >> >> 159-383 >> 188, 222, 279, 394 >> 130-210 - 2h after lunch:  124-213 >> 162-263, 287 >> 181-279 >> 111, 136, 137, 355, 374 >> n/c - before dinner: 56, 63-160, 228 >> 129-251 >> >> 124-302  >> 164, 183, 311 >> 240-250 - 2h after dinner:126-379 >> 127-219 >> 83-254, 346 >> n/c >> 103, 183, 289 >> exercises after dinner: 180-190s - bedtime: 160-180 >> n/c >> 357 >> see above >> 116-224 >> see above -  Nighttime: 43-83 >> 168-277 >> 214, 183 >> n/c Lowest: 67x1 >> 43, 48, 53 >> 101 >> 83 >> 103 >> 50s (corrected a high  before going to the gym). Highest: 370 >> 300s (missed Lantus) >> 394 >> 320s.  Pt's meals are: - Breakfast: cereal + 1% milk, with protein bar - Lunch: sandwich - Dinner: eating out or eating in - Snacks: 1-2 a day  - no CKD, last BUN/creatinine:  Lab Results  Component Value Date   BUN 11 01/15/2016   CREATININE 1.00 01/15/2016  He is on Lisinopril. - last set of lipids: Lab Results  Component Value Date   CHOL 180 08/14/2015   HDL 47.40 08/14/2015   LDLCALC 105 (H) 08/14/2015   TRIG 134.0 08/14/2015   CHOLHDL 4 08/14/2015  - last eye exam was 04/07/2015. No DR.  - no numbness and tingling in his feet.   I reviewed pt's medications, allergies, PMH, social hx, family hx, and changes were documented in the history of present illness. Otherwise, unchanged from my initial visit note.   ROS: Constitutional: no weight gain/loss,+ fatigue, no subjective hyperthermia/hypothermia Eyes: no blurry vision, no xerophthalmia ENT: no sore throat, no nodules palpated in throat, no dysphagia/odynophagia, no hoarseness Cardiovascular: no CP/SOB/palpitations/leg swelling Respiratory: no cough/no SOB Gastrointestinal: no N/V/D/C Musculoskeletal: no muscle aches/no joint aches Skin: no rashes  Neurological: no tremors/numbness/tingling/dizziness  PE: BP 102/68 (BP Location: Left Arm, Patient Position: Sitting)   Pulse 88   Ht 6' (1.829 m)   Wt 190 lb (86.2 kg)   SpO2 98%   BMI 25.77 kg/m  Body mass index is 25.77 kg/m.  Wt Readings from Last 3 Encounters:  05/23/16 190 lb (86.2 kg)  04/22/16 195 lb 6.4 oz (88.6 kg)  03/18/16 196 lb 3.2 oz (89 kg)   Constitutional: normal weight, in NAD Eyes: PERRLA, EOMI, no exophthalmos ENT: moist mucous membranes, no thyromegaly, no cervical lymphadenopathy Cardiovascular: RRR, No MRG Respiratory: CTA B Gastrointestinal: abdomen soft, NT, ND, BS+ Musculoskeletal: no deformities, strength intact in all 4 Skin: moist, warm, no  rashes Neurological: no tremor with outstretched hands, DTR normal in all 4  ASSESSMENT: 1. DM2, insulin-dependent, uncontrolled, without complications - Received records with C-peptide of 3.67 (0.8-3.9), from 06/16/2005 ([facility]). No associated glucose reported. - son with DM1 (not on a pump)  PLAN:  1. Patient with long-standing, uncontrolled diabetes. He was doing much better on an insulin pump, however, he decided that that he would want to switch back to a basal-bolus regimen. Sugars increased afterwards >> we tried to add Tanzeum, but he stopped as he developed N/V. He tells me that he is now interested to retry a GLP-1 receptor agonist. Will try Trulicity low dose, 0.75 mg weekly. - His sugars are much better if he does not forget his metformin. I suggested to switch to metformin extended-release so he can take the entire dose at one time, with dinner. He is open to this. - Upon questioning, he is not really using the parameters that I gave him for Humalog dosing, since he is afraid of dropping his sugars too low. Therefore, he ends up with 32 units of Lantus and less or equal to 18 units of NovoLog per day. I explained that this regimen is conducive to increasing sugars during the day, which she is actually experiencing. Therefore, I would not increase his insulin doses for now, but I strongly advised him to follow the regimen. - I advised him to: Patient Instructions  Please change to Metformin ER 2000 mg with dinner.  Please start Trulicity 0.75 mg weekly.  Please continue: - Humalog as follows  - Insulin to carb ratio: 1:7 - target: 120 - Insulin sensitivity factor: 30  Please return in 3 months with your sugar log.   - continue checking sugars at different times of the day - check 4 times a day, rotating checks - He needs a new eye exam >> advised to schedule - most recent HbA1c was 8.2% (lower) >> today: 9.8% (higher) - Return to clinic in 3 mo with sugar log    Carlus Pavlovristina Jazz Rogala, MD PhD [facility]

## 2016-05-24 LAB — POCT GLYCOSYLATED HEMOGLOBIN (HGB A1C): Hemoglobin A1C: 9.8

## 2016-05-24 MED FILL — METFORMIN HCL ER 500 MG TAB: 500 | 90 days supply | Qty: 360 | Fill #0

## 2016-05-24 NOTE — Addendum Note (Signed)
Addended by: Darene LamerHOMPSON, Louden Houseworth T on: 05/24/2016 02:49 PM   Modules accepted: Orders

## 2016-05-25 ENCOUNTER — Other Ambulatory Visit: Payer: Self-pay

## 2016-05-26 ENCOUNTER — Telehealth: Payer: Self-pay

## 2016-05-26 NOTE — Telephone Encounter (Signed)
Patient returned phone call regarding the medications, patient does not want to do a daily injection, he would like to try the ozempic medication, I advised we had a sample we could give him. He will call back to let us know when he will be able to come pick it up.

## 2016-05-26 NOTE — Telephone Encounter (Signed)
Called and LVM for patient regarding the PA for trulicity, advised that he had to try Victoza according to insurance. I advised that we had sample of the Ozempic that he could try, but it was up to him. Left call back number for him to let us know.

## 2016-05-29 ENCOUNTER — Encounter: Payer: Self-pay | Admitting: Internal Medicine

## 2016-05-30 MED FILL — LANTUS SOLOSTAR 100 UNITS/M: 100 | 47 days supply | Qty: 15 | Fill #5

## 2016-05-31 ENCOUNTER — Telehealth: Payer: Self-pay

## 2016-05-31 ENCOUNTER — Encounter: Payer: Self-pay | Admitting: Internal Medicine

## 2016-05-31 ENCOUNTER — Other Ambulatory Visit: Payer: Self-pay

## 2016-05-31 MED ORDER — SITAGLIPTIN PHOSPHATE 100 MG PO TABS: ORAL_TABLET | ORAL | 1 refills | Status: DC

## 2016-05-31 MED FILL — JANUVIA 100 MG TABLET: 100 | 90 days supply | Qty: 90 | Fill #0

## 2016-05-31 NOTE — Telephone Encounter (Signed)
Okay to add Januvia 100 mg before breakfast daily.

## 2016-05-31 NOTE — Telephone Encounter (Signed)
Called patient and advised to change the medications. Patient had no questions at this time.

## 2016-05-31 NOTE — Telephone Encounter (Signed)
Submitted

## 2016-05-31 NOTE — Telephone Encounter (Signed)
Patient called, stated he did not want to try the ozempic medication. Patient would like to stay under the health and wellness program. Please advise. Thank you!

## 2016-06-09 ENCOUNTER — Ambulatory Visit (INDEPENDENT_AMBULATORY_CARE_PROVIDER_SITE_OTHER): Payer: 59 | Admitting: Internal Medicine

## 2016-06-09 ENCOUNTER — Encounter: Payer: Self-pay | Admitting: Internal Medicine

## 2016-06-09 VITALS — BP 102/70 | HR 91 | Ht 72.0 in | Wt 189.0 lb

## 2016-06-09 DIAGNOSIS — R040 Epistaxis: Secondary | ICD-10-CM | POA: Insufficient documentation

## 2016-06-09 DIAGNOSIS — G4733 Obstructive sleep apnea (adult) (pediatric): Secondary | ICD-10-CM

## 2016-06-09 HISTORY — DX: Obstructive sleep apnea (adult) (pediatric): G47.33

## 2016-06-09 NOTE — Assessment & Plan Note (Signed)
Discussed obstructive sleep apnea, impact on sleep quality and associated morbidities, potential to impact safety driving and briefly surveyed diagnostic and treatment considerations. Plan-schedule sleep study with return to discuss treatment.

## 2016-06-09 NOTE — Progress Notes (Signed)
06/09/2016-51 year old male never smoker Referred by  PCP Saguier PA-C for sleep evaluation. C/O headaches all day; pt has tried ibuprofen, aleve, tyelnol, and aspirin which did not help headaches. Symptoms have been ongoing for 3 years Medical problems include Acute recurrent maxillary sinusitis, DM 2, chronic headache, GERD She describes frequent waking at night and is told by his wife, a nurse, that he stops breathing and snores loudly. He feels exhausted all day and drinks at least 6 caffeinated drinks per day. No sleep medications. Averages 5 or 6 hours of sleep at night. ENT surgery 1993-sinus surgery, septoplasty "help for 6 months". Denies heart, lung or thyroid disease.  Prior to Admission medications   Medication Sig Start Date End Date Taking? Authorizing Provider  Alcohol Swabs (ALCOHOL PREPS) PADS Use alcohol pads to clean site to be used to check blood sugar. 05/02/14  Yes Carlus Pavlovristina Gherghe, MD  Blood Glucose Monitoring Suppl (FREESTYLE LITE) DEVI Use to check sugar 4 times daily. 04/27/16  Yes Carlus Pavlovristina Gherghe, MD  glucagon (GLUCAGON EMERGENCY) 1 MG injection Inject 1 mg into the muscle once as needed. 05/05/15  Yes Carlus Pavlovristina Gherghe, MD  glucose blood (FREESTYLE LITE) test strip Use as instructed to check sugar 4 times daily. 04/27/16  Yes Carlus Pavlovristina Gherghe, MD  Insulin Glargine (LANTUS SOLOSTAR) 100 UNIT/ML Solostar Pen Inject 14 Units into the skin 2 (two) times daily.    Yes Historical Provider, MD  insulin lispro (HUMALOG KWIKPEN) 100 UNIT/ML KiwkPen Inject 0.06-0.1 mLs (6-10 Units total) into the skin 3 (three) times daily. 07/31/15  Yes Carlus Pavlovristina Gherghe, MD  Lancets (FREESTYLE) lancets Use as instructed to check sugar 4 times daily. 04/27/16  Yes Carlus Pavlovristina Gherghe, MD  lisinopril (PRINIVIL,ZESTRIL) 2.5 MG tablet TAKE 1 TABLET BY MOUTH DAILY. 08/05/15  Yes Waldon MerlWilliam C Martin, PA-C  metFORMIN (GLUCOPHAGE-XR) 500 MG 24 hr tablet Take 4 tablets (2,000 mg total) by mouth daily with supper. 05/23/16  Yes  Carlus Pavlovristina Gherghe, MD  sitaGLIPtin (JANUVIA) 100 MG tablet Take one tablet daily before breakfast. 05/31/16  Yes Carlus Pavlovristina Gherghe, MD  Dulaglutide (TRULICITY) 0.75 MG/0.5ML SOPN Inject 0.75 mg weekly under skin Patient not taking: Reported on 06/09/2016 05/23/16   Carlus Pavlovristina Gherghe, MD   Past Medical History:  Diagnosis Date  . Depression   . Diabetes mellitus    type II  . GERD (gastroesophageal reflux disease)   . Headache(784.0)   . Hypogonadism male    Past Surgical History:  Procedure Laterality Date  . NASAL SINUS SURGERY  04/1991  . TONSILLECTOMY  1972    Family History  Problem Relation Age of Onset  . Adopted: Yes  . Cancer Father [id number]    Prostate cancer  . Healthy Mother   . Diabetes Mellitus I Son    Social History   Social History  . Marital status: Married    Spouse name: Asencion IslamMarva  . Number of children: 2  . Years of education: N/A   Occupational History  .  [employer]    Mental Health Case Management   Social History Main Topics  . Smoking status: Never Smoker  . Smokeless tobacco: Never Used  . Alcohol use No  . Drug use: No  . Sexual activity: Not on file   Other Topics Concern  . Not on file   Social History Narrative   He lives with wife and two children.   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   Highest level of education: Child psychotherapistmaster degree  ROS-see HPI    "+" = pos Constitutional:    weight loss, night sweats, fevers, chills, +fatigue, lassitude. HEENT:    headaches, difficulty swallowing, tooth/dental problems, sore throat,       sneezing, itching, ear ache, +nasal congestion, post nasal drip, +snoring, + Epistaxis CV:    chest pain, orthopnea, PND, swelling in lower extremities, anasarca,                                                    dizziness, palpitations Resp:   shortness of breath with exertion or at rest.                productive cough,   non-productive cough, coughing up of blood.               change in color of mucus.  wheezing.   Skin:    rash or lesions. GI:  No-   heartburn, indigestion, abdominal pain, nausea, vomiting, diarrhea,                 change in bowel habits, loss of appetite GU: dysuria, change in color of urine, no urgency or frequency.   flank pain. MS:   joint pain, stiffness, decreased range of motion, back pain. Neuro-     nothing unusual Psych:  change in mood or affect.  depression or anxiety.   memory loss.  OBJ- Physical Exam General- Alert, Oriented, Affect-quiet/reserved, Distress- none acute, slender Skin- rash-none, lesions- none, excoriation- none Lymphadenopathy- none Head- atraumatic            Eyes- Gross vision intact, PERRLA, conjunctivae and secretions clear            Ears- Hearing, canals-normal            Nose- +Blood clot left nostril, no-Septal dev, mucus, polyps, erosion, perforation             Throat- Mallampati III , mucosa clear , drainage- none, tonsils- atrophic Neck- flexible , trachea midline, no stridor , thyroid nl, carotid no bruit Chest - symmetrical excursion , unlabored           Heart/CV- RRR , no murmur , no gallop  , no rub, nl s1 s2                           - JVD- none , edema- none, stasis changes- none, varices- none           Lung- clear to P&A, wheeze- none, cough- none , dullness-none, rub- none           Chest wall-  Abd-  Br/ Gen/ Rectal- Not done, not indicated Extrem- cyanosis- none, clubbing, none, atrophy- none, strength- nl Neuro- grossly intact to observation      .

## 2016-06-09 NOTE — Patient Instructions (Signed)
Order- schedule unattended Home Sleep Test    Dx OSA  We will get you back to look in more detail at treatment options once we have the results of your study  Please call as needed

## 2016-06-09 NOTE — Assessment & Plan Note (Signed)
There is a fairly large clot in the left nostril. He admits to epistaxis "sometimes". Remote sinus surgery and septoplasty. He did not endorse use of cocaine. This can be watched but may be pertinent to nasal airway comfort relevant to management of OSA

## 2016-06-22 ENCOUNTER — Telehealth: Payer: Self-pay | Admitting: Internal Medicine

## 2016-06-22 ENCOUNTER — Other Ambulatory Visit: Payer: Self-pay

## 2016-06-22 MED ORDER — GLUCOSE BLOOD VI STRP: ORAL_STRIP | 5 refills | Status: DC

## 2016-06-22 NOTE — Telephone Encounter (Signed)
Patient called stating a prior authorization is needed for HST with [facility]).. Patient contact # [phone].Charm Rings

## 2016-06-22 NOTE — Telephone Encounter (Signed)
lmom tcb x1  According to [employer] [facility] [employer] employee's with our insurance do not need PA for sleep studies.

## 2016-06-22 NOTE — Telephone Encounter (Signed)
Patient returned call.Charm Rings

## 2016-06-22 NOTE — Telephone Encounter (Deleted)
New prescription for glucose blood (FREESTYLE LITE) test strip  [facility] - [place], [place] - [address] [phone] (Phone) [phone] (Fax)

## 2016-06-22 NOTE — Telephone Encounter (Signed)
Spoke with the pt He states that just an hour ago, he called the number on the back of his ins card and he was advised that he does need PA for HST Will forward back to PCC's to handle, thanks

## 2016-06-22 NOTE — Telephone Encounter (Signed)
New prescription for glucose blood (FREESTYLE LITE) test strip ° °[facility] [place] [facility] - [place], [place] - [address] [phone] (Phone) °[phone] (Fax)  ° ° °

## 2016-06-23 NOTE — Telephone Encounter (Signed)
med center called stated patient is 17 days early for a refill of glucose blood (FREESTYLE LITE) test strip, patient dad stated he need more checks.  Please advise  [facility] - [place], [place] - [address] [phone] (Phone) [phone] (Fax)

## 2016-06-23 NOTE — Telephone Encounter (Signed)
[facility] h[facility] does not required precert, call ref# [id number], spoke to pt he has been made aware of this Tobe Sos

## 2016-06-24 ENCOUNTER — Telehealth: Payer: Self-pay | Admitting: Internal Medicine

## 2016-06-24 NOTE — Telephone Encounter (Signed)
Pt is calling stating that he needs more test strips per refills glucose blood (FREESTYLE LITE) test strip and is going to need more before his next refill as he is sharing them with his son Nathaniel [name].  They are both testing about 6 times per day.

## 2016-06-27 ENCOUNTER — Other Ambulatory Visit: Payer: Self-pay

## 2016-06-27 MED ORDER — GLUCOSE BLOOD VI STRP: ORAL_STRIP | 5 refills | Status: DC

## 2016-06-27 MED FILL — FREESTYLE LITE TEST STRIP: 83 days supply | Qty: 500 | Fill #0

## 2016-06-27 NOTE — Telephone Encounter (Signed)
Attempted to contact patient through both numbers, answered and then hung up. I was calling to advise patient we had submitted his and his sons test strips to the pharmacy testing 6 times daily, per dr.gherghe approval.  

## 2016-06-27 NOTE — Telephone Encounter (Signed)
Nathaniel [name], let's resent with 6 times a day, as they are doing. More is always better, provided this is covered by their insurance.

## 2016-06-27 NOTE — Telephone Encounter (Signed)
Please advise. I submitted refills for both the son and the father both testing 4 times daily, regarding the last OV notes. I attempted to get them samples, and we have none. Contacted the rep, and she was out of town; Thank you.

## 2016-06-27 NOTE — Telephone Encounter (Signed)
Attempted to contact patient through both numbers, answered and then hung up. I was calling to advise patient we had submitted his and his sons test strips to the pharmacy testing 6 times daily, per dr.gherghe approval.

## 2016-06-28 DIAGNOSIS — H524 Presbyopia: Secondary | ICD-10-CM | POA: Diagnosis not present

## 2016-06-28 DIAGNOSIS — H5211 Myopia, right eye: Secondary | ICD-10-CM | POA: Diagnosis not present

## 2016-06-28 DIAGNOSIS — H40013 Open angle with borderline findings, low risk, bilateral: Secondary | ICD-10-CM | POA: Diagnosis not present

## 2016-06-28 DIAGNOSIS — H5202 Hypermetropia, left eye: Secondary | ICD-10-CM | POA: Diagnosis not present

## 2016-06-28 DIAGNOSIS — E119 Type 2 diabetes mellitus without complications: Secondary | ICD-10-CM | POA: Diagnosis not present

## 2016-06-28 DIAGNOSIS — H52223 Regular astigmatism, bilateral: Secondary | ICD-10-CM | POA: Diagnosis not present

## 2016-06-28 LAB — HM DIABETES EYE EXAM

## 2016-06-29 ENCOUNTER — Encounter: Payer: Self-pay | Admitting: Internal Medicine

## 2016-06-30 NOTE — Telephone Encounter (Signed)
Would you abstract pt diabetic eye exam and then place it for scanning. Placing on MA station area on key board.

## 2016-07-05 ENCOUNTER — Encounter: Payer: Self-pay | Admitting: Internal Medicine

## 2016-07-05 DIAGNOSIS — G4733 Obstructive sleep apnea (adult) (pediatric): Secondary | ICD-10-CM | POA: Diagnosis not present

## 2016-07-06 ENCOUNTER — Other Ambulatory Visit: Payer: Self-pay | Admitting: Internal Medicine

## 2016-07-06 DIAGNOSIS — Z8639 Personal history of other endocrine, nutritional and metabolic disease: Secondary | ICD-10-CM

## 2016-07-07 ENCOUNTER — Other Ambulatory Visit: Payer: 59

## 2016-07-07 ENCOUNTER — Telehealth: Payer: Self-pay | Admitting: Internal Medicine

## 2016-07-07 DIAGNOSIS — Z8639 Personal history of other endocrine, nutritional and metabolic disease: Secondary | ICD-10-CM | POA: Diagnosis not present

## 2016-07-07 DIAGNOSIS — E1165 Type 2 diabetes mellitus with hyperglycemia: Secondary | ICD-10-CM | POA: Diagnosis not present

## 2016-07-07 NOTE — Telephone Encounter (Signed)
LMTCB

## 2016-07-07 NOTE — Telephone Encounter (Signed)
Katie have you received any results from CY on this pts home sleep test?

## 2016-07-07 NOTE — Telephone Encounter (Signed)
Report is on CY's cart to review-pt has appt on 07-26-16 with CY for follow up; we can see patient sooner if he would like-any RNA slot next week is fine. Thanks.

## 2016-07-08 DIAGNOSIS — G4733 Obstructive sleep apnea (adult) (pediatric): Secondary | ICD-10-CM | POA: Diagnosis not present

## 2016-07-08 NOTE — Telephone Encounter (Signed)
Study has been read, confirming he does have obstructive sleep apnea. Report is being scanned into computer and should be available for next steps sometime next week.

## 2016-07-08 NOTE — Telephone Encounter (Signed)
Patient calling back - He can be reached at [phone] -pr

## 2016-07-08 NOTE — Telephone Encounter (Signed)
Patient sent email this am "Please have someone call me to discuss sleep study result from Tuesday [phone]"

## 2016-07-08 NOTE — Telephone Encounter (Signed)
Spoke with pt, who is requesting  HST results from 07/05/16.   CY please advise.

## 2016-07-11 ENCOUNTER — Other Ambulatory Visit: Payer: Self-pay | Admitting: *Deleted

## 2016-07-11 DIAGNOSIS — G4733 Obstructive sleep apnea (adult) (pediatric): Secondary | ICD-10-CM

## 2016-07-11 NOTE — Telephone Encounter (Signed)
Called and spoke with pt and he is aware of results per CY.  °

## 2016-07-12 ENCOUNTER — Encounter: Payer: Self-pay | Admitting: Internal Medicine

## 2016-07-12 LAB — TESTOSTERONE, FREE, LC/MS/MS: Testosterone, Free, LCM/MS/MS: 65.3 pg/mL (ref 46.0–224.0)

## 2016-07-12 NOTE — Telephone Encounter (Signed)
Done

## 2016-07-15 ENCOUNTER — Ambulatory Visit: Payer: Self-pay | Admitting: Pharmacist

## 2016-07-17 ENCOUNTER — Encounter: Payer: Self-pay | Admitting: Internal Medicine

## 2016-07-18 ENCOUNTER — Telehealth: Payer: Self-pay | Admitting: Internal Medicine

## 2016-07-18 ENCOUNTER — Institutional Professional Consult (permissible substitution): Payer: 59 | Admitting: Internal Medicine

## 2016-07-18 MED FILL — LISINOPRIL 2.5 MG TABLET: 2.5 | 90 days supply | Qty: 90 | Fill #3

## 2016-07-18 NOTE — Telephone Encounter (Signed)
  Nathaniel [name]  2:43 PM  Note    Pt is wanting to know some things prior to setting up CPAP machine: -How severe was Sleep Apnea? -What type of Sleep Apnea does he have? -How often in the night are the apnea events? -Are there any other treatment options that will work for his sleep apnea?  Pt very frustrated, states that he is not getting any answers to his questions - I do not see where these questions have been asked - Aware that I will talk to CY myself and will call him back in a little bit. I apologized for the inconvenience.   Please advise Dr Maple Hudson. Thanks.       [name], MD  to [facility] Triage Pool  2:33 PM  Note    Ok to order new DME, new CPAP, mask of choice, humidifier, supplies,AirView    Dx OSA    Based on his HST.

## 2016-07-18 NOTE — Telephone Encounter (Signed)
Telephone note open for this.  Will close this encounter.

## 2016-07-18 NOTE — Telephone Encounter (Signed)
Dr. Young  Please Advise- Please see pt email 

## 2016-07-18 NOTE — Telephone Encounter (Signed)
Pt is wanting to know some things prior to setting up CPAP machine: -How severe was Sleep Apnea? -What type of Sleep Apnea does he have? -How often in the night are the apnea events? -Are there any other treatment options that will work for his sleep apnea?  Pt very frustrated, states that he is not getting any answers to his questions - I do not see where these questions have been asked - Aware that I will talk to CY myself and will call him back in a little bit. I apologized for the inconvenience.   Please advise Dr Maple Hudson. Thanks.

## 2016-07-18 NOTE — Telephone Encounter (Signed)
Spoke with Florentina Addison, she called patient and spoke with him about the results and process of reviewing. Pt is scheduled with CY on 07/19/16 at 9:30. Nothing further needed.

## 2016-07-18 NOTE — Telephone Encounter (Signed)
This is why we try to get people into the office to go over questions and discuss alternatives. His score was in the mild to moderate range, stopping about 14 times per hour. Usually CPAP is the first choice. /some people can be treated with a fitted mouth piece.

## 2016-07-18 NOTE — Telephone Encounter (Signed)
Ok to order new DME, new CPAP, mask of choice, humidifier, supplies,AirView    Dx OSA    Based on his HST.

## 2016-07-19 ENCOUNTER — Ambulatory Visit (INDEPENDENT_AMBULATORY_CARE_PROVIDER_SITE_OTHER): Payer: 59 | Admitting: Internal Medicine

## 2016-07-19 ENCOUNTER — Encounter: Payer: Self-pay | Admitting: Internal Medicine

## 2016-07-19 VITALS — BP 116/78 | HR 73 | Resp 16 | Ht 72.0 in | Wt 185.6 lb

## 2016-07-19 DIAGNOSIS — J3089 Other allergic rhinitis: Secondary | ICD-10-CM

## 2016-07-19 DIAGNOSIS — G4733 Obstructive sleep apnea (adult) (pediatric): Secondary | ICD-10-CM

## 2016-07-19 DIAGNOSIS — J302 Other seasonal allergic rhinitis: Secondary | ICD-10-CM | POA: Diagnosis not present

## 2016-07-19 NOTE — Assessment & Plan Note (Signed)
Less concerned with congestion at this visit despite ongoing pollen season.

## 2016-07-19 NOTE — Progress Notes (Signed)
06/09/2016-51 year old male never smoker Referred by  PCP Saguier PA-C for sleep evaluation. C/O headaches all day; pt has tried ibuprofen, aleve, tyelnol, and aspirin which did not help headaches. Symptoms have been ongoing for 3 years Medical problems include Acute recurrent maxillary sinusitis, DM 2, chronic headache, GERD He describes frequent waking at night and is told by his wife, a nurse, that he stops breathing and snores loudly. He feels exhausted all day and drinks at least 6 caffeinated drinks per day. No sleep medications. Averages 5 or 6 hours of sleep at night. ENT surgery 1993-sinus surgery, septoplasty "helped for 6 months". Denies heart, lung or thyroid disease.  07/19/16- 51 year old male never smoker followed for OSA, complicated by headache, acute recurrent maxillary sinusitis, DM 2, GERD Unattended Home Sleep Test 07/05/16-AHI 14.7/hour, desaturation to 79%, body weight 189 pounds. FOLLOW FOR REVIEW HST  He has called several times about results of this test and has been particularly hopeful it would help him with chronic headaches. I explained I could not promise that. We talked in detail about treatment options, realistic outcomes and goals, comfort issues. He has lost a few pounds and complains he has had "no energy to exercise". We are starting CPAP with AutoPap 5-20.  ROS-see HPI    "+" = pos Constitutional:    +weight loss, night sweats, fevers, chills, +fatigue, lassitude. HEENT:   + headaches, difficulty swallowing, tooth/dental problems, sore throat,       sneezing, itching, ear ache, +nasal congestion, post nasal drip, +snoring, CV:    chest pain, orthopnea, PND, swelling in lower extremities, anasarca,                                                    dizziness, palpitations Resp:   shortness of breath with exertion or at rest.                productive cough,   non-productive cough, coughing up of blood.              change in color of mucus.  wheezing.   Skin:     rash or lesions. GI:  No-   heartburn, indigestion, abdominal pain, nausea, vomiting, diarrhea,                 change in bowel habits, loss of appetite GU: dysuria, change in color of urine, no urgency or frequency.   flank pain. MS:   joint pain, stiffness, decreased range of motion, back pain. Neuro-     nothing unusual Psych:  change in mood or affect.  depression or anxiety.   memory loss.  OBJ- Physical Exam General- Alert, Oriented, Affect-quiet/reserved, Distress- none acute, + slender Skin- rash-none, lesions- none, excoriation- none Lymphadenopathy- none Head- atraumatic            Eyes- Gross vision intact, PERRLA, conjunctivae and secretions clear            Ears- Hearing, canals-normal            Nose-  no-Septal dev, mucus, polyps, erosion, perforation             Throat- Mallampati III , mucosa clear , drainage- none, tonsils- atrophic Neck- flexible , trachea midline, no stridor , thyroid nl, carotid no bruit Chest - symmetrical excursion , unlabored  Heart/CV- RRR , no murmur , no gallop  , no rub, nl s1 s2                           - JVD- none , edema- none, stasis changes- none, varices- none           Lung- clear to P&A, wheeze- none, cough- none , dullness-none, rub- none           Chest wall-  Abd-  Br/ Gen/ Rectal- Not done, not indicated Extrem- cyanosis- none, clubbing, none, atrophy- none, strength- nl Neuro- grossly intact to observation      .

## 2016-07-19 NOTE — Assessment & Plan Note (Signed)
He has mild to moderate obstructive sleep apnea despite slender body habitus. CPAP or fitted oral appliance would be common first choices for this. I also discussed the pending availability of implanted nerve stimulator's because he will be hearing about it eventually. Plan-start CPAP auto 5-20 with appropriate instructions and follow-up.

## 2016-07-19 NOTE — Patient Instructions (Signed)
Order- new DME new CPAP auto 5-20, mask of choice, humidifier, supplies, AirView   Dx OSA  Please cal as needed

## 2016-07-21 ENCOUNTER — Encounter: Payer: Self-pay | Admitting: Internal Medicine

## 2016-07-21 ENCOUNTER — Telehealth: Payer: Self-pay | Admitting: Internal Medicine

## 2016-07-21 NOTE — Telephone Encounter (Signed)
See pt email from today (5/3)- this encounter has already been handled.

## 2016-07-26 ENCOUNTER — Ambulatory Visit: Payer: 59 | Admitting: Internal Medicine

## 2016-07-26 DIAGNOSIS — G4733 Obstructive sleep apnea (adult) (pediatric): Secondary | ICD-10-CM | POA: Diagnosis not present

## 2016-07-29 ENCOUNTER — Other Ambulatory Visit: Payer: Self-pay

## 2016-07-29 ENCOUNTER — Other Ambulatory Visit: Payer: Self-pay | Admitting: Internal Medicine

## 2016-07-29 MED ORDER — INSULIN GLARGINE 100 UNIT/ML SOLOSTAR PEN: PEN_INJECTOR | SUBCUTANEOUS | 0 refills | Status: DC

## 2016-07-29 MED ORDER — BASAGLAR KWIKPEN 100 UNIT/ML ~~LOC~~ SOPN: PEN_INJECTOR | SUBCUTANEOUS | 0 refills | Status: DC

## 2016-07-29 MED FILL — LANTUS SOLOSTAR 100 UNITS/M: 100 | 90 days supply | Qty: 18 | Fill #0

## 2016-07-29 MED FILL — HUMALOG 100 UNITS/ML KWIKPE: 100 | 50 days supply | Qty: 15 | Fill #3

## 2016-07-29 NOTE — Telephone Encounter (Signed)
Pharmacy called stated insulin was denied  Glargine(LANTUS SOLOSTAR) 100 UNIT/ML Solostar Pen  Please advise  [facility] [place] [facility] [place] [id number] [phone] (Phone) [phone] (Fax)

## 2016-08-04 ENCOUNTER — Encounter: Payer: Self-pay | Admitting: Internal Medicine

## 2016-08-24 ENCOUNTER — Telehealth: Payer: Self-pay | Admitting: Internal Medicine

## 2016-08-24 NOTE — Telephone Encounter (Signed)
Called patient back. He stated someone from this office called him yesterday. Searched in patient's chart and did not see any documentation of lab or imaging calls. Apologized to patient. Nothing else needed at time of call.

## 2016-08-26 DIAGNOSIS — G4733 Obstructive sleep apnea (adult) (pediatric): Secondary | ICD-10-CM | POA: Diagnosis not present

## 2016-08-30 MED FILL — JANUVIA 100 MG TABLET: 100 | 90 days supply | Qty: 90 | Fill #1

## 2016-08-30 MED FILL — METFORMIN HCL ER 500 MG TAB: 500 | 90 days supply | Qty: 360 | Fill #1

## 2016-09-25 DIAGNOSIS — G4733 Obstructive sleep apnea (adult) (pediatric): Secondary | ICD-10-CM | POA: Diagnosis not present

## 2016-09-28 ENCOUNTER — Ambulatory Visit: Payer: 59 | Admitting: Internal Medicine

## 2016-09-30 ENCOUNTER — Telehealth: Payer: 59 | Admitting: Nurse Practitioner

## 2016-09-30 DIAGNOSIS — M549 Dorsalgia, unspecified: Secondary | ICD-10-CM | POA: Diagnosis not present

## 2016-09-30 MED ORDER — NAPROXEN 500 MG PO TABS: 500.0000 mg | ORAL_TABLET | Freq: Two times a day (BID) | ORAL | 1 refills | Status: DC

## 2016-09-30 MED ORDER — CYCLOBENZAPRINE HCL 10 MG PO TABS: 10.0000 mg | ORAL_TABLET | Freq: Three times a day (TID) | ORAL | 1 refills | Status: DC | PRN

## 2016-09-30 NOTE — Progress Notes (Signed)

## 2016-10-10 MED FILL — FREESTYLE LITE TEST STRIP: 83 days supply | Qty: 500 | Fill #1

## 2016-10-12 DIAGNOSIS — G4733 Obstructive sleep apnea (adult) (pediatric): Secondary | ICD-10-CM | POA: Diagnosis not present

## 2016-10-20 ENCOUNTER — Encounter: Payer: Self-pay | Admitting: Internal Medicine

## 2016-10-20 ENCOUNTER — Ambulatory Visit (INDEPENDENT_AMBULATORY_CARE_PROVIDER_SITE_OTHER): Payer: 59 | Admitting: Internal Medicine

## 2016-10-20 DIAGNOSIS — G4733 Obstructive sleep apnea (adult) (pediatric): Secondary | ICD-10-CM

## 2016-10-20 DIAGNOSIS — J3089 Other allergic rhinitis: Secondary | ICD-10-CM

## 2016-10-20 DIAGNOSIS — J302 Other seasonal allergic rhinitis: Secondary | ICD-10-CM | POA: Diagnosis not present

## 2016-10-20 NOTE — Patient Instructions (Signed)
We can continue CPAP auto 5-20, mask of choice, humidifier, supplies, AirView    Dx OSA  Please call as needed 

## 2016-10-20 NOTE — Assessment & Plan Note (Signed)
He has settled into successful use of CPAP with good compliance and control. He benefits, notably with improved daytime fatigue such that he now feels able to start jogging.

## 2016-10-20 NOTE — Progress Notes (Signed)
HPI male never smoker followed for OSA, complicated by headache, acute recurrent maxillary sinusitis, DM 2, GERD Unattended Home Sleep Test 07/05/16-AHI 14.7/hour, desaturation to 79%, body weight 189 pounds.  ----------------------------------------------------------------------------------------------------------------  07/19/16- 51 year old male never smoker followed for OSA, complicated by headache, acute recurrent maxillary sinusitis, DM 2, GERD Unattended Home Sleep Test 07/05/16-AHI 14.7/hour, desaturation to 79%, body weight 189 pounds. FOLLOW FOR REVIEW HST  He has called several times about results of this test and has been particularly hopeful it would help him with chronic headaches. I explained I could not promise that. We talked in detail about treatment options, realistic outcomes and goals, comfort issues. He has lost a few pounds and complains he has had "no energy to exercise". We are starting CPAP with AutoPap 5-20.  10/20/16- 51 year old male never smoker followed for OSA, complicated by headache, acute recurrent maxillary sinusitis, DM 2, GERD CPAP auto 5-20/ Advanced  FOLLOWS FOR:DME [facility] Pt states his CPAP is working well for him. No issues with pressure and no new supplies needed at this time. DL attached- 95%/697%/4 hour compliance, AHI 4.1/hour. Using CPAP every night after getting used to it. He says he definitely sleeps better and has more daytime energy which was his primary concern. He says he has been able to take up jogging. Fullface mask. We discussed adjustment of humidifier.  ROS-see HPI    "+" = pos Constitutional:    +weight loss, night sweats, fevers, chills, fatigue, lassitude. HEENT:   + headaches, difficulty swallowing, tooth/dental problems, sore throat,       sneezing, itching, ear ache, +nasal congestion, post nasal drip, +snoring, CV:    chest pain, orthopnea, PND, swelling in lower extremities, anasarca,                                    dizziness, palpitations Resp:   shortness of breath with exertion or at rest.                productive cough,   non-productive cough, coughing up of blood.              change in color of mucus.  wheezing.   Skin:    rash or lesions. GI:  No-   heartburn, indigestion, abdominal pain, nausea, vomiting, diarrhea,                 change in bowel habits, loss of appetite GU: dysuria, change in color of urine, no urgency or frequency.   flank pain. MS:   joint pain, stiffness, decreased range of motion, back pain. Neuro-     nothing unusual Psych:  change in mood or affect.  depression or anxiety.   memory loss.  OBJ- Physical Exam     stable baseline exam General- Alert, Oriented, Affect-quiet/reserved, Distress- none acute, + slender Skin- rash-none, lesions- none, excoriation- none Lymphadenopathy- none Head- atraumatic            Eyes- Gross vision intact, PERRLA, conjunctivae and secretions clear            Ears- Hearing, canals-normal            Nose-  no-Septal dev, mucus, polyps, erosion, perforation             Throat- Mallampati III , mucosa clear , drainage- none, tonsils- atrophic Neck- flexible , trachea midline, no stridor , thyroid nl, carotid no bruit Chest - symmetrical excursion ,  unlabored           Heart/CV- RRR , no murmur , no gallop  , no rub, nl s1 s2                           - JVD- none , edema- none, stasis changes- none, varices- none           Lung- clear to P&A, wheeze- none, cough- none , dullness-none, rub- none           Chest wall-  Abd-  Br/ Gen/ Rectal- Not done, not indicated Extrem- cyanosis- none, clubbing, none, atrophy- none, strength- nl Neuro- grossly intact to observation      .

## 2016-10-20 NOTE — Assessment & Plan Note (Signed)
He is comfortable at this visit but says he chose a fullface mask because of his history of "lots of sinus problems".

## 2016-10-26 DIAGNOSIS — G4733 Obstructive sleep apnea (adult) (pediatric): Secondary | ICD-10-CM | POA: Diagnosis not present

## 2016-11-03 ENCOUNTER — Telehealth: Payer: Self-pay | Admitting: Medical

## 2016-11-03 MED ORDER — LISINOPRIL 2.5 MG PO TABS: 2.5000 mg | ORAL_TABLET | Freq: Every day | ORAL | 0 refills | Status: DC

## 2016-11-03 MED FILL — LISINOPRIL 2.5 MG TABLET: 2.5 | 90 days supply | Qty: 90 | Fill #0

## 2016-11-03 NOTE — Telephone Encounter (Signed)
Rx sent to pharmacy   

## 2016-11-03 NOTE — Telephone Encounter (Signed)
Self.   Refill request for Lisinopril   [facility] [place] - [phone] [address]

## 2016-11-08 ENCOUNTER — Telehealth: Payer: Self-pay | Admitting: Internal Medicine

## 2016-11-08 ENCOUNTER — Encounter: Payer: Self-pay | Admitting: Internal Medicine

## 2016-11-08 NOTE — Telephone Encounter (Signed)
lmtcb X1 for pt  

## 2016-11-09 NOTE — Telephone Encounter (Signed)
Download has been printed. Will place on CY's chart.

## 2016-11-09 NOTE — Telephone Encounter (Signed)
Called and spoke with pt and he stated that his numbers today and last night were perfect.  Pt stated that one night it was 17.9 and 8 but he was understanding that it needed to be under 5.  He stated that he did turn off the 20 min ramp ---he stated that this was an auto ramp and he did not see the importance.  cpap is actually set on 5-16?  He is not really sure.  He stated that he cannot tell a difference with these numbers.  He is not having any issues.  He has noticed that he feels more tired than normal.  DME>[facility]   CY please advise. thanks

## 2016-11-09 NOTE — Telephone Encounter (Signed)
He is on autopap 5-20. Can you please print off an AirView report?

## 2016-11-10 NOTE — Telephone Encounter (Signed)
Pt calling back concerning message from yesterday.

## 2016-11-11 ENCOUNTER — Ambulatory Visit: Payer: Self-pay | Admitting: Medical

## 2016-11-11 NOTE — Telephone Encounter (Signed)
Per CY verbally- download received and reviewed. Overall average is well. Don't mind the numbers going up & down.  Pt is aware and voiced his understanding. Nothing further needed.

## 2016-11-11 NOTE — Telephone Encounter (Signed)
Spoke with pt, who states he was calling for update on cpap settings.   CY please advise. Thanks.

## 2016-11-15 ENCOUNTER — Ambulatory Visit: Payer: Self-pay | Admitting: Family Medicine

## 2016-11-18 ENCOUNTER — Encounter: Payer: Self-pay | Admitting: Internal Medicine

## 2016-11-18 ENCOUNTER — Ambulatory Visit (INDEPENDENT_AMBULATORY_CARE_PROVIDER_SITE_OTHER): Payer: 59 | Admitting: Internal Medicine

## 2016-11-18 ENCOUNTER — Ambulatory Visit: Payer: 59 | Admitting: Pharmacist

## 2016-11-18 VITALS — BP 100/62 | HR 85 | Resp 16 | Wt 188.0 lb

## 2016-11-18 DIAGNOSIS — Z794 Long term (current) use of insulin: Secondary | ICD-10-CM | POA: Diagnosis not present

## 2016-11-18 DIAGNOSIS — E1165 Type 2 diabetes mellitus with hyperglycemia: Secondary | ICD-10-CM

## 2016-11-18 LAB — TSH: TSH: 1.56 u[IU]/mL (ref 0.35–4.50)

## 2016-11-18 LAB — MICROALBUMIN / CREATININE URINE RATIO
Creatinine,U: 52.4 mg/dL
Microalb Creat Ratio: 1.3 mg/g (ref 0.0–30.0)
Microalb, Ur: <0.7 mg/dL (ref 0.0–1.9)

## 2016-11-18 LAB — LIPID PANEL
Cholesterol: 155 mg/dL (ref 0–200)
HDL: 56.2 mg/dL (ref >39.00)
LDL Cholesterol: 74 mg/dL (ref 0–99)
NonHDL: 99.12
Total CHOL/HDL Ratio: 3
Triglycerides: 125 mg/dL (ref 0.0–149.0)
VLDL: 25 mg/dL (ref 0.0–40.0)

## 2016-11-18 LAB — POCT GLYCOSYLATED HEMOGLOBIN (HGB A1C): Hemoglobin A1C: 9.6

## 2016-11-18 NOTE — Progress Notes (Addendum)
Patient ID: Nathaniel [name], male   DOB: Apr 11, 1965, 51 y.o.   MRN: [id number]  HPI: Nathaniel [name] is a 51 y.o.-year-old man, returning for f/u for DM2, dx 2001, insulin-dependent since 2010, uncontrolled, without complications. Last visit 5.5 mo ago.  He was on a pump: Medtronic 630G >> came off and decided that he does not want the pump anymore.  He was dx'ed with OSA >> has a CPAP.  He has both a fulltime job and a part time job.  He ran a 5k this summer. Did not run in 2 weeks >> injured his Achilles tendon. He plans to restart running and has another 5K at the end of next month.   Last hemoglobin A1c was: Lab Results  Component Value Date   HGBA1C 9.8 05/24/2016   HGBA1C 8.2 (H) 01/15/2016   HGBA1C 9.3 10/02/2015  11/08/2013: 9.5% 04/11/2013: 9.8%  Now on: - Metformin ER 2000 mg with dinner >> am - Januvia 100 mg daily in am - started 05/2016 - Lantus 12 units in am and 12 units in pm (my miss Lantus at night) - Humalog as follows (was using 5-6 units per meal at last visit - strict doses - as he is afraid of low CBGs)  Stopped Tanzeum b/c N/V >> was in UC x2. Trulicity denied by insurance until he tried Victoza >> he did not want to try this.  Pt checks his sugars 4.7x a day - ave 224 +/- 75.4 (60-448): - am: 103, 132,195 >> 100-190 >> 100-130, 190 (missed Lantus) - 2h after b'fast:  194-272 >> 83-309 >> 172 >> n/c - before lunch: 1159-383 >> 188, 222, 279, 394 >> 130-210 >> 190s - 2h after lunch:  181-279 >> 111, 136, 137, 355, 374 >> n/c - before dinner: 124-302  >> 164, 183, 311 >> 240-250 >> 200-230 - 2h after dinner: 103, 183, 289 >> exercises after dinner: 180-190s >> n/c - bedtime: 160-180 >> n/c >> 357 >> see above >> 116-224 >> see above      -  Nighttime: 43-83 >> 168-277 >> 214, 183 >> n/c Lowest: 60 (corrected a high before going to the gym). Highest: 320s.  Pt's meals are: - Breakfast: cereal + 1% milk, with protein bar - Lunch: sandwich - Dinner:  eating out or eating in - Snacks: 1-2 a day  - No CKD, last BUN/creatinine:  Lab Results  Component Value Date   BUN 11 01/15/2016   CREATININE 1.00 01/15/2016  On Lisinopril. - last set of lipids: Lab Results  Component Value Date   CHOL 180 08/14/2015   HDL 47.40 08/14/2015   LDLCALC 105 (H) 08/14/2015   TRIG 134.0 08/14/2015   CHOLHDL 4 08/14/2015  - last eye exam was 06/2016 >> No DR. - he denies numbness and tingling in his feet.    ROS: Constitutional: no weight gain/no weight loss, no fatigue, no subjective hyperthermia, no subjective hypothermia Eyes: no blurry vision, no xerophthalmia ENT: no sore throat, no nodules palpated in throat, no dysphagia, no odynophagia, no hoarseness Cardiovascular: no CP/no SOB/no palpitations/no leg swelling Respiratory: no cough/no SOB/no wheezing Gastrointestinal: no N/no V/no D/no C/no acid reflux Musculoskeletal: no muscle aches/no joint aches Skin: no rashes, no hair loss Neurological: no tremors/no numbness/no tingling/no dizziness  I reviewed pt's medications, allergies, PMH, social hx, family hx, and changes were documented in the history of present illness. Otherwise, unchanged from my initial visit note.  PE: BP 100/62   Pulse 85  Resp 16   Wt 188 lb (85.3 kg)   SpO2 98%   BMI 25.50 kg/m  Body mass index is 25.5 kg/m.  Wt Readings from Last 3 Encounters:  11/18/16 188 lb (85.3 kg)  10/20/16 186 lb 6.4 oz (84.6 kg)  07/19/16 185 lb 9.6 oz (84.2 kg)   Constitutional: normal weight, in NAD Eyes: PERRLA, EOMI, no exophthalmos ENT: moist mucous membranes, no thyromegaly, no cervical lymphadenopathy Cardiovascular: RRR, No MRG Respiratory: CTA B Gastrointestinal: abdomen soft, NT, ND, BS+ Musculoskeletal: no deformities, strength intact in all 4 Skin: moist, warm, no rashes Neurological: no tremor with outstretched hands, DTR normal in all 4  ASSESSMENT: 1. DM2, insulin-dependent, uncontrolled, without  complications - Received records with C-peptide of 3.67 (0.8-3.9), from 06/16/2005 ([facility]). No associated glucose reported. - son with DM1 (not on a pump)  PLAN:  1. Patient with long-standing, uncontrolled DM, previously doing better on an insulin pump, now struggling with a basal-bolus regimen. Sugars are lowe in am and increase especially after b'fast but they remain high throughout the day - improved some with addition of Januvia. He is reluctant to use an ICR and SSI >> will need to give him strict doses. As sugars Increase in a step-wise fashion >> will increase his mealtime doses by 2 units with a smaller meal and by 4 units with a larger meal. - he had 2 mild lows: 60s in last month >> usually exercise related - I suggested a Freestyle Libre CGM >> given info >> will let me know if he decides for it - I advised him to: Patient Instructions  Please continue: - Metformin ER 2000 mg with dinner. - Januvia 100 mg daily in am - Lantus 12 units in am and 12 units at bedtime   Change: - Humalog 7 units before a smaller meal and 9 units before a larger meal  Please let me know about the Freestyle Libre CGM.  Please stop at the lab.  Please return in 3 months with your sugar log.    - today, HbA1c is 9.8% (stable, high) - continue checking sugars at different times of the day - check 3x a day, rotating checks - advised for yearly eye exams >> he is UTD - will check annual labs - Return to clinic in 3 mo with sugar log   - time spent with the patient: 25 min, of which >50% was spent in reviewing his sugars, discussing his hypo- and hyper-glycemic episodes, reviewing  previous labs and insulin doses and developing a plan to avoid hypo- and hyper-glycemia.   Office Visit on 11/18/2016  Component Date Value Ref Range Status  . Hemoglobin A1C 11/18/2016 9.8   Final  . Microalb, Ur 11/18/2016 <0.7  0.0 - 1.9 mg/dL Final  . Creatinine,U 91/47/829508/31/2018 52.4  mg/dL Final  . Microalb  Creat Ratio 11/18/2016 1.3  0.0 - 30.0 mg/g Final  . Cholesterol 11/18/2016 155  0 - 200 mg/dL Final   ATP III Classification       Desirable:  < 200 mg/dL               Borderline High:  200 - 239 mg/dL          High:  > = 621240 mg/dL  . Triglycerides 11/18/2016 125.0  0.0 - 149.0 mg/dL Final   Normal:  <308<150 mg/dLBorderline High:  150 - 199 mg/dL  . HDL 11/18/2016 56.20  >39.00 mg/dL Final  . VLDL 65/78/469608/31/2018 25.0  0.0 -  40.0 mg/dL Final  . LDL Cholesterol 11/18/2016 74  0 - 99 mg/dL Final  . Total CHOL/HDL Ratio 11/18/2016 3   Final                  Men          Women1/2 Average Risk     3.4          3.3Average Risk          5.0          4.42X Average Risk          9.6          7.13X Average Risk          15.0          11.0                      . NonHDL 11/18/2016 99.12   Final   NOTE:  Non-HDL goal should be 30 mg/dL higher than patient's LDL goal (i.e. LDL goal of < 70 mg/dL, would have non-HDL goal of < 100 mg/dL)  . TSH 11/18/2016 1.56  0.35 - 4.50 uIU/mL Final     Chemistry      Component Value Date/Time   NA 135 11/18/2016 1401   K 4.9 11/18/2016 1401   CL 98 11/18/2016 1401   CO2 22 11/18/2016 1401   BUN 12 11/18/2016 1401   CREATININE 1.12 11/18/2016 1401      Component Value Date/Time   CALCIUM 9.3 11/18/2016 1401   ALKPHOS 56 11/18/2016 1401   AST 17 11/18/2016 1401   ALT 17 11/18/2016 1401   BILITOT 1.0 11/18/2016 1401     Normal labs.   Nathaniel Pavlov, MD PhD [facility] Endocrinology

## 2016-11-18 NOTE — Patient Instructions (Addendum)
Please continue: - Metformin ER 2000 mg with dinner. - Januvia 100 mg daily in am - Lantus 12 units in am and 12 units at bedtime   Change: - Humalog 7 units before a smaller meal and 9 units before a larger meal  Please let me know about the Freestyle Libre CGM.  Please stop at the lab.  Please return in 3 months with your sugar log.

## 2016-11-19 LAB — COMPLETE METABOLIC PANEL WITH GFR
ALT: 17 U/L (ref 9–46)
AST: 17 U/L (ref 10–35)
Albumin: 4.4 g/dL (ref 3.6–5.1)
Alkaline Phosphatase: 56 U/L (ref 40–115)
BUN: 12 mg/dL (ref 7–25)
CO2: 22 mmol/L (ref 20–32)
Calcium: 9.3 mg/dL (ref 8.6–10.3)
Chloride: 98 mmol/L (ref 98–110)
Creat: 1.12 mg/dL (ref 0.70–1.33)
GFR, Est African American: 87 mL/min (ref >=60)
GFR, Est Non African American: 76 mL/min (ref >=60)
Glucose, Bld: 143 mg/dL — ABNORMAL HIGH (ref 65–99)
Potassium: 4.9 mmol/L (ref 3.5–5.3)
Sodium: 135 mmol/L (ref 135–146)
Total Bilirubin: 1 mg/dL (ref 0.2–1.2)
Total Protein: 6.7 g/dL (ref 6.1–8.1)

## 2016-11-21 ENCOUNTER — Encounter: Payer: Self-pay | Admitting: Internal Medicine

## 2016-11-22 ENCOUNTER — Encounter: Payer: Self-pay | Admitting: Internal Medicine

## 2016-11-22 ENCOUNTER — Other Ambulatory Visit: Payer: Self-pay | Admitting: Internal Medicine

## 2016-11-22 MED ORDER — FREESTYLE LIBRE SENSOR SYSTEM MISC: 1.0000 | 11 refills | Status: DC

## 2016-11-22 MED ORDER — FREESTYLE LIBRE READER DEVI: 1.0000 | Freq: Three times a day (TID) | 1 refills | Status: DC

## 2016-11-22 MED FILL — FREESTYLE LIBRE SENSOR SYST: 30 days supply | Qty: 3 | Fill #0

## 2016-11-22 MED FILL — FREESTYLE LIBRE READER DEVI: 1 days supply | Qty: 1 | Fill #0

## 2016-11-23 ENCOUNTER — Telehealth: Payer: Self-pay | Admitting: Nutrition

## 2016-11-23 ENCOUNTER — Telehealth: Payer: Self-pay | Admitting: Internal Medicine

## 2016-11-23 NOTE — Telephone Encounter (Signed)
Phone Call to patient.  Discussed the Sensor, and what it does.  He said that the [facility] does not cover this under link to wellness.  According the the [employer] rep. Right now it is covered, but at a $35.00 copay/month.  There is also a study starting with the sensor, and he was given the name of the person to call to see if he can be in the study.  This was left on a voice mail after speaking to the [employer] rep.

## 2016-11-23 NOTE — Telephone Encounter (Signed)
Message left on machine to call me today until 5PM.  Telephone number given

## 2016-11-23 NOTE — Telephone Encounter (Signed)
Phoned patient.  Went to Recruitment consultantvoice mail.  Told him to call Dr. Charlean SanfilippoGherghe's office if he wants us to [facility].  I told him I will be out of the office until Monday.

## 2016-11-23 NOTE — Telephone Encounter (Signed)
Patient returning Linda's call °

## 2016-11-24 NOTE — Telephone Encounter (Signed)
I already did the first time he tole me he wanted it.

## 2016-11-25 ENCOUNTER — Ambulatory Visit: Payer: Self-pay | Admitting: Pharmacist

## 2016-11-27 ENCOUNTER — Encounter: Payer: Self-pay | Admitting: Internal Medicine

## 2016-11-29 MED FILL — LANTUS SOLOSTAR 100 UNITS/M: 100 | 60 days supply | Qty: 12 | Fill #1

## 2016-12-02 MED FILL — METFORMIN HCL ER 500 MG TAB: 500 | 90 days supply | Qty: 360 | Fill #2

## 2016-12-06 ENCOUNTER — Other Ambulatory Visit: Payer: Self-pay | Admitting: Internal Medicine

## 2016-12-06 MED FILL — JANUVIA 100 MG TABLET: 100 | 90 days supply | Qty: 90 | Fill #0

## 2016-12-16 ENCOUNTER — Encounter: Payer: Self-pay | Admitting: Pharmacist

## 2016-12-16 ENCOUNTER — Other Ambulatory Visit: Payer: Self-pay | Admitting: Pharmacist

## 2016-12-16 VITALS — BP 109/74 | HR 80 | Wt 190.0 lb

## 2016-12-16 DIAGNOSIS — Z794 Long term (current) use of insulin: Principal | ICD-10-CM

## 2016-12-16 DIAGNOSIS — E119 Type 2 diabetes mellitus without complications: Secondary | ICD-10-CM

## 2016-12-16 NOTE — Patient Outreach (Signed)
[facility]) Care Management  [place]   12/16/2016  Nathaniel [name] 1965-08-22 [id number]  Subjective: Patient presents today for 3 month diabetes follow-up as part of the employer-sponsored Link to Wellness program.  Current diabetes regimen includes lantus 12 BID, humalog 5-6 units with meals.  Patient also continues on daily lisinopril. Patient endorses improved compliance to medications. Most recent MD follow-up was 11/18/16. At that time, CGM was started with [facility]. No med changes or major health changes at this time.   Forgets to take humalog ~1x/week - big improvement from previous. Loves the [employer]. Spending about 50% of his time high, 50% of time in range, very few lows.  Patient reported dietary habits: Eats 3 meals/day. 24 hr diet recall below Breakfast: PB&J Lunch: pizza Dinner: usually home made sandwich, leftovers Snacks: PB crackers, protein bar  Drinks: diet pepsi  Patient reported exercise habits: running x3-4/week, 45'/run (used to run 5-6 times/week)  Patient reports hypoglycemic events. 5x/week - up a bit as he reports being more aggressive with humalog dosing.  Patient reports nocturia 1-2x.  Patient denies pain/burning upon urination.  Patient denies neuropathy. Patient denies visual changes. Patient reports self foot exams. Daily, no issues.   Patient reported self monitored blood glucose frequency ~10 times/day  Home fasting CBG: 70-180  2 hour post-prandial/random CBG: 190ish  Objective:  Lab Results  Component Value Date   HGBA1C 9.6 11/18/2016   Vitals:   12/16/16 1703  BP: 109/74  Pulse: 80   Filed Weights   12/16/16 1703  Weight: 190 lb (86.2 kg)   Lipid Panel     Component Value Date/Time   CHOL 155 11/18/2016 1401   TRIG 125.0 11/18/2016 1401   HDL 56.20 11/18/2016 1401   CHOLHDL 3 11/18/2016 1401   VLDL 25.0 11/18/2016 1401   LDLCALC 74 11/18/2016 1401   Last eye exam: 6 months  ago Last dental exam: 6 months ago Vaccines: allergic to flu shots   Encounter Medications: Outpatient Encounter Prescriptions as of 12/16/2016  Medication Sig  . Alcohol Swabs (ALCOHOL PREPS) PADS Use alcohol pads to clean site to be used to check blood sugar.  . Blood Glucose Monitoring Suppl (FREESTYLE LITE) DEVI Use to check sugar 4 times daily.  . Continuous Blood Gluc Receiver (FREESTYLE LIBRE READER) DEVI 1 Device by Does not apply route 3 (three) times daily.  . Continuous Blood Gluc Sensor (FREESTYLE LIBRE SENSOR SYSTEM) MISC 1 Device by Does not apply route every 30 (thirty) days.  Marland Kitchen glucagon (GLUCAGON EMERGENCY) 1 MG injection Inject 1 mg into the muscle once as needed.  Marland Kitchen glucose blood (FREESTYLE LITE) test strip Use as instructed to check sugar 6 times daily.  . Insulin Glargine (LANTUS SOLOSTAR) 100 UNIT/ML Solostar Pen Inject 10 units into the skin 2 times daily (Patient taking differently: Inject 12 units into the skin 2 times daily)  . insulin lispro (HUMALOG KWIKPEN) 100 UNIT/ML KiwkPen Inject 0.06-0.1 mLs (6-10 Units total) into the skin 3 (three) times daily. (Patient taking differently: Inject 5-6 Units into the skin 3 (three) times daily. )  . JANUVIA 100 MG tablet TAKE ONE TABLET DAILY BEFORE BREAKFAST.  Marland Kitchen Lancets (FREESTYLE) lancets Use as instructed to check sugar 4 times daily.  Marland Kitchen lisinopril (PRINIVIL,ZESTRIL) 2.5 MG tablet Take 1 tablet (2.5 mg total) by mouth daily.  . metFORMIN (GLUCOPHAGE-XR) 500 MG 24 hr tablet Take 4 tablets (2,000 mg total) by mouth daily with supper. (Patient taking differently: Take  2,000 mg by mouth daily with breakfast. )  . [DISCONTINUED] naproxen (NAPROSYN) 500 MG tablet Take 1 tablet (500 mg total) by mouth 2 (two) times daily with a meal.   No facility-administered encounter medications on file as of 12/16/2016.     Functional Status: In your present state of health, do you have any difficulty performing the following activities:  12/16/2016 01/11/2016  Hearing? N N  Vision? N N  Difficulty concentrating or making decisions? N N  Walking or climbing stairs? N N  Dressing or bathing? N N  Doing errands, shopping? N N  Some recent data might be hidden    Fall/Depression Screening: Fall Risk  12/16/2016 09/08/2015 03/20/2015  Falls in the past year? No No No  Number falls in past yr: - - -  Injury with Fall? - - -  Comment - - -  Risk for fall due to : - - -  Risk for fall due to: Comment - - -  Follow up - - -   PHQ 2/9 Scores 12/16/2016 01/11/2016 09/08/2015 03/20/2015 02/20/2015 08/22/2014 06/13/2014  PHQ - 2 Score 0 0 1 0 0 0 0   Assessment: Diabetes: poorly controlled, limited by adherence to medications previously, improved per patient report and estimated A1C based on avg BG over the last 2 weeks. - A1C 9.6% which is above goal of less than 7%.  - Weight is stable from last visit with me.    Physical Activity- adequate - Above goal of at least 150 minutes of moderate intensity exercise weekly  Nutrition- adequate - Maintaining adherence to diabetic diet 50-75% of the time  Plan/Goals for Next Visit: - Counseled on s/sx hypoglycemia and appropriate treatment - Diet goal: Watch which foods spike your BG - Exercise goal: Continue the good habits - Provided information on CGM data - Detailed goals and action plans are listed below  Next appointment to see me is: Jan, 2018  Nathaniel [name], PharmD [facility] PGY2 Pharmacy Resident [phone]  [facility] CM Care Plan Problem One     Most Recent Value  Care Plan Problem One  Physical Activity  Role Documenting the Problem One  Clinical Pharmacist  Care Plan for Problem One  Active  [facility] Long Term Goal   [name] will continue to exercise 150 mins/week as evidenced by patient report over the next 90 days.   [facility] Long Term Goal Start Date  01/12/16  [facility] Long Term Goal Met Date  12/16/16  Interventions for Problem One Long Term Goal  Counseled on importance of  exercise, encouraged him to keep up the good work

## 2016-12-19 ENCOUNTER — Other Ambulatory Visit: Payer: Self-pay | Admitting: Internal Medicine

## 2016-12-19 MED FILL — HUMALOG 100 UNITS/ML KWIKPE: 100 | 50 days supply | Qty: 15 | Fill #0

## 2016-12-19 MED FILL — FREESTYLE LIBRE SENSOR SYST: 30 days supply | Qty: 3 | Fill #1

## 2016-12-28 ENCOUNTER — Encounter: Payer: Self-pay | Admitting: Internal Medicine

## 2016-12-28 NOTE — Telephone Encounter (Signed)
Some people do experience persistent sleepiness even when sleep apnea is controlled. Make sure to get enough sleep time, and try taking a 20-30 minute nap some days. If that doesn't help, suggest we send Rx to try ritalin 10 mg, # 60. 1 at breakfast and 1 at lunch, if needed.

## 2016-12-28 NOTE — Telephone Encounter (Signed)
CY Please see the attached DL for patient and advise on any changes needed. Thanks.

## 2017-01-02 ENCOUNTER — Encounter: Payer: Self-pay | Admitting: Family Medicine

## 2017-01-02 ENCOUNTER — Ambulatory Visit (INDEPENDENT_AMBULATORY_CARE_PROVIDER_SITE_OTHER): Payer: 59 | Admitting: Family Medicine

## 2017-01-02 VITALS — BP 115/66 | HR 57 | Wt 192.0 lb

## 2017-01-02 DIAGNOSIS — M5412 Radiculopathy, cervical region: Secondary | ICD-10-CM

## 2017-01-02 NOTE — Patient Instructions (Signed)
Thank you for coming in today. Do the home traction.  Let me know if you want to do PT.  Let me know if you want to try prednisone or gabapentin.  If not better in 4 weeks or if worse let me know and I will order a MRI.    Cervical Radiculopathy Cervical radiculopathy happens when a nerve in the neck (cervical nerve) is pinched or bruised. This condition can develop because of an injury or as part of the normal aging process. Pressure on the cervical nerves can cause pain or numbness that runs from the neck all the way down into the arm and fingers. Usually, this condition gets better with rest. Treatment may be needed if the condition does not improve. What are the causes? This condition may be caused by:  Injury.  Slipped (herniated) disk.  Muscle tightness in the neck because of overuse.  Arthritis.  Breakdown or degeneration in the bones and joints of the spine (spondylosis) due to aging.  Bone spurs that may develop near the cervical nerves.  What are the signs or symptoms? Symptoms of this condition include:  Pain that runs from the neck to the arm and hand. The pain can be severe or irritating. It may be worse when the neck is moved.  Numbness or weakness in the affected arm and hand.  How is this diagnosed? This condition may be diagnosed based on symptoms, medical history, and a physical exam. You may also have tests, including:  X-rays.  CT scan.  MRI.  Electromyogram (EMG).  Nerve conduction tests.  How is this treated? In many cases, treatment is not needed for this condition. With rest, the condition usually gets better over time. If treatment is needed, options may include:  Wearing a soft neck collar for short periods of time.  Physical therapy to strengthen your neck muscles.  Medicines, such as NSAIDs, oral corticosteroids, or spinal injections.  Surgery. This may be needed if other treatments do not help. Various types of surgery may be done  depending on the cause of your problems.  Follow these instructions at home: Managing pain  Take over-the-counter and prescription medicines only as told by your health care provider.  If directed, apply ice to the affected area. ? Put ice in a plastic bag. ? Place a towel between your skin and the bag. ? Leave the ice on for 20 minutes, 2-3 times per day.  If ice does not help, you can try using heat. Take a warm shower or warm bath, or use a heat pack as told by your health care provider.  Try a gentle neck and shoulder massage to help relieve symptoms. Activity  Rest as needed. Follow instructions from your health care provider about any restrictions on activities.  Do stretching and strengthening exercises as told by your health care provider or physical therapist. General instructions  If you were given a soft collar, wear it as told by your health care provider.  Use a flat pillow when you sleep.  Keep all follow-up visits as told by your health care provider. This is important. Contact a health care provider if:  Your condition does not improve with treatment. Get help right away if:  Your pain gets much worse and cannot be controlled with medicines.  You have weakness or numbness in your hand, arm, face, or leg.  You have a high fever.  You have a stiff, rigid neck.  You lose control of your bowels or your  bladder (have incontinence).  You have trouble with walking, balance, or speaking. This information is not intended to replace advice given to you by your health care provider. Make sure you discuss any questions you have with your health care provider. Document Released: 11/30/2000 Document Revised: 08/13/2015 Document Reviewed: 05/01/2014 Elsevier Interactive Patient Education  [employer].

## 2017-01-02 NOTE — Progress Notes (Signed)
Nathaniel [name] is a 51 y.o. male who presents to [facility] today for upper arm pain.  Patient reports pain in right upper arm for 2 weeks. He endorses increased activity with running and lifting weights recently. Pain is dull and feels "like someone punched me in the arm yesterday". The pain improves some with rest. He does not have any pain at night or with daily activities such as putting on a shirt. He denies any radiation of the pain, numbness, tingling, or trauma to the area. 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 He is concerned that this may be related.   Past Medical History:  Diagnosis Date  . Depression   . Diabetes mellitus    type II  . GERD (gastroesophageal reflux disease)   . Headache(784.0)   . Hypogonadism male    Past Surgical History:  Procedure Laterality Date  . NASAL SINUS SURGERY  04/1991  . TONSILLECTOMY  1972   Social History  Substance Use Topics  . Smoking status: Never Smoker  . Smokeless tobacco: Never Used  . Alcohol use No     ROS:  As above   Medications: Current Outpatient Prescriptions  Medication Sig Dispense Refill  . Alcohol Swabs (ALCOHOL PREPS) PADS Use alcohol pads to clean site to be used to check blood sugar. 100 each 11  . Blood Glucose Monitoring Suppl (FREESTYLE LITE) DEVI Use to check sugar 4 times daily. 1 each 0  . Continuous Blood Gluc Receiver (FREESTYLE LIBRE READER) DEVI 1 Device by Does not apply route 3 (three) times daily. 1 Device 1  . Continuous Blood Gluc Sensor (FREESTYLE LIBRE SENSOR SYSTEM) MISC 1 Device by Does not apply route every 30 (thirty) days. 3 each 11  . glucagon (GLUCAGON EMERGENCY) 1 MG injection Inject 1 mg into the muscle once as needed. 1 each 3  . glucose blood (FREESTYLE LITE) test strip Use as instructed to check sugar 6 times daily. 600 each 5  . HUMALOG KWIKPEN 100 UNIT/ML KiwkPen  INJECT 0.06-0.1 MLS (6-10 UNITS TOTAL) INTO THE SKIN 3 (THREE) TIMES DAILY. 15 mL 5  . Insulin Glargine (LANTUS SOLOSTAR) 100 UNIT/ML Solostar Pen Inject 10 units into the skin 2 times daily (Patient taking differently: Inject 12 units into the skin 2 times daily) 10 pen 0  . JANUVIA 100 MG tablet TAKE ONE TABLET DAILY BEFORE BREAKFAST. 90 tablet 1  . Lancets (FREESTYLE) lancets Use as instructed to check sugar 4 times daily. 400 each 5  . lisinopril (PRINIVIL,ZESTRIL) 2.5 MG tablet Take 1 tablet (2.5 mg total) by mouth daily. 90 tablet 0  . metFORMIN (GLUCOPHAGE-XR) 500 MG 24 hr tablet Take 4 tablets (2,000 mg total) by mouth daily with supper. (Patient taking differently: Take 2,000 mg by mouth daily with breakfast. ) 360 tablet 3   No current facility-administered medications for this visit.    Allergies  Allergen Reactions  . Cymbalta [Duloxetine Hcl] Other (See Comments)    Suicidal thoughts  . Influenza Vaccines Other (See Comments)    Unknown reaction  . Penicillins Other (See Comments)    Unknown - childhood allergy per mother  . Pioglitazone Other (See Comments)      headache  . Promethazine Hcl Nausea And Vomiting       . Reglan [Metoclopramide] Other (See Comments)    Jittery, increased anxiety     Exam:  BP 115/66  Pulse (!) 57   Wt 192 lb (87.1 kg)   BMI 26.04 kg/m  General: Well Developed, well nourished, and in no acute distress.  Neuro/Psych: Alert and oriented x3, extra-ocular muscles intact, able to move all 4 extremities, sensation grossly intact, flat affect Skin: Warm and dry, no rashes noted.  Respiratory: Not using accessory muscles, speaking in full sentences, trachea midline.  Cardiovascular: Pulses palpable, no extremity edema. Abdomen: Does not appear distended. MSK: Right shoulder: No gross deformity or edema No tenderness to palpation Range of motion is full with flexion, extension, abduction, adduction, decreased internal rotation Strength is  5/5 with flexion and extension Hawkins negative, Neers negative, Empty can test is uncomfortable but no weakness noted  Neck: No gross deformity or swelling noted on inspection No tenderness to palpation Range of motion is full with flexion, extension, and lateral rotation   Study Result   CLINICAL DATA:  Severe neck pain for 3 weeks with right arm pain, numbness and weakness. No previous relevant surgery or acute injury.  EXAM: MRI CERVICAL SPINE WITHOUT CONTRAST  TECHNIQUE: Multiplanar, multisequence MR imaging was performed. No intravenous contrast was administered.  COMPARISON:  None.  FINDINGS: Examination is mildly motion degraded. There is reversal of the usual cervical lordosis inferiorly with a grade 1 anterolisthesis at C4-5. There is no evidence of acute fracture or paraspinous ligamentous injury.  The craniocervical junction appears normal. The cervical cord is normal in signal and caliber. There are bilateral vertebral artery flow voids.  C2-3: Mild uncinate spurring and facet hypertrophy. No significant spinal stenosis or nerve root encroachment.  C3-4: Uncinate spurring and facet hypertrophy contribute to mild biforaminal stenosis. There is no cord deformity.  C4-5: There is disc bulging and mild bilateral facet hypertrophy contributing to mild biforaminal stenosis. No cord deformity.  C5-6: The axial images through this level are motion degraded. There is a broad-based disc protrusion with an asymmetric left paracentral component. This mildly indents the ventral surface of the cord. In addition, there is a probable medial foraminal component on the right, best seen on the sagittal images. This may contribute to right C6 nerve root encroachment. The left foramen of also appears mildly narrowed.  C6-7:  No significant findings.  C7-T1:  No significant findings.  IMPRESSION: 1. The primary abnormality is at C5-6 where there is a  broad-based disc protrusion with apparent right foraminal and left paracentral components. Detail is limited by motion on the axial images, although there is evidence of mild cord flattening and right greater than left foraminal stenosis. 2. Uncinate spurring and facet hypertrophy contribute to mild foraminal narrowing at C3-4 and C4-5. No other focal disc protrusion or cord deformity identified.   Electronically Signed   By: Roxy Horseman M.D.   On: 03/09/2013 12:32      No results found for this or any previous visit (from the past 48 hour(s)). No results found.    Assessment and Plan: 51 y.o. male with right upper arm pain. The location of his pain is classic for rotator cuff injury, but his physical exam does not agree with this diagnosis. He most likely has a lingering issue from his history of a bulging disc. The following options were discussed with the patient: a short course of oral prednisone, gabapentin or pregabalin for pain relief, and referral to physical therapy. Patient active for formal physical therapy and home exercises. Patient may need another MRI in preparation for epidural steroid injection if symptoms do not improve over the  next 6 weeks.     No orders of the defined types were placed in this encounter.  No orders of the defined types were placed in this encounter.   Discussed warning signs or symptoms. Please see discharge instructions. Patient expresses understanding.

## 2017-01-13 ENCOUNTER — Encounter: Payer: Self-pay | Admitting: Internal Medicine

## 2017-01-13 DIAGNOSIS — G4733 Obstructive sleep apnea (adult) (pediatric): Secondary | ICD-10-CM

## 2017-01-16 ENCOUNTER — Telehealth: Payer: Self-pay

## 2017-01-16 NOTE — Telephone Encounter (Signed)
Please go ahead and cancel that request to change companies. I have decided to stay with [facility] for now  ----- Message -----  From: CMA Nathaniel [name]  Sent: 01/13/2017 5:01 PM EDT  To: Nathaniel [name]  Subject: RE: Visit Follow-Up Question  Hi Nathaniel GensJeffrey,   I went ahead and sent the order to switch companies to our referral coordinators   It can be a timely process to switch companies, so I would give it at least 4-7 days and if you do not hear from someone please give us a call  Have a great weekend!!

## 2017-01-16 NOTE — Telephone Encounter (Signed)
I have sent a fax to [facility] to cancel the switch

## 2017-01-20 ENCOUNTER — Other Ambulatory Visit: Payer: Self-pay | Admitting: Internal Medicine

## 2017-01-20 MED FILL — LANTUS SOLOSTAR 100 UNITS/M: 100 | 88 days supply | Qty: 21 | Fill #0

## 2017-01-23 MED FILL — FREESTYLE LIBRE SENSOR SYST: 30 days supply | Qty: 3 | Fill #2

## 2017-01-25 ENCOUNTER — Telehealth: Payer: Self-pay | Admitting: Internal Medicine

## 2017-01-25 DIAGNOSIS — G4733 Obstructive sleep apnea (adult) (pediatric): Secondary | ICD-10-CM

## 2017-01-25 NOTE — Telephone Encounter (Signed)
Per CY-okay to put in order for CPAP supplies and mask to [facility]. [facility] and nothing more needed at this time.

## 2017-01-31 ENCOUNTER — Telehealth: Payer: Self-pay | Admitting: *Deleted

## 2017-01-31 ENCOUNTER — Telehealth: Payer: Self-pay

## 2017-01-31 NOTE — Telephone Encounter (Signed)
Called and notified patient of MD note. Patient understood.  

## 2017-01-31 NOTE — Telephone Encounter (Signed)
Patient states he [facility] and he has been comparing with his meter and has noticed that it has been a 30-51 point difference. Patient is wondering if that is normal. Patient is requesting a call back. His contact # is  [phone]. Please advise. Thank you

## 2017-01-31 NOTE — Telephone Encounter (Signed)
Occasionally, I see this - mostly with lower blood sugars. I would still continue the CGM if only for trends.

## 2017-01-31 NOTE — Telephone Encounter (Signed)
Please advise, is this normal with the freestyle?

## 2017-02-06 DIAGNOSIS — G4733 Obstructive sleep apnea (adult) (pediatric): Secondary | ICD-10-CM | POA: Diagnosis not present

## 2017-02-13 MED FILL — FREESTYLE LIBRE SENSOR SYST: 30 days supply | Qty: 3 | Fill #3

## 2017-02-20 ENCOUNTER — Encounter: Payer: Self-pay | Admitting: Family Medicine

## 2017-02-20 ENCOUNTER — Ambulatory Visit (INDEPENDENT_AMBULATORY_CARE_PROVIDER_SITE_OTHER): Payer: 59 | Admitting: Family Medicine

## 2017-02-20 ENCOUNTER — Encounter: Payer: Self-pay | Admitting: Internal Medicine

## 2017-02-20 VITALS — BP 116/72 | HR 70 | Ht 72.0 in | Wt 193.0 lb

## 2017-02-20 DIAGNOSIS — E1165 Type 2 diabetes mellitus with hyperglycemia: Secondary | ICD-10-CM | POA: Diagnosis not present

## 2017-02-20 DIAGNOSIS — Z1211 Encounter for screening for malignant neoplasm of colon: Secondary | ICD-10-CM | POA: Diagnosis not present

## 2017-02-20 DIAGNOSIS — E507 Other ocular manifestations of vitamin A deficiency: Secondary | ICD-10-CM | POA: Diagnosis not present

## 2017-02-20 DIAGNOSIS — Z0000 Encounter for general adult medical examination without abnormal findings: Secondary | ICD-10-CM | POA: Diagnosis not present

## 2017-02-20 DIAGNOSIS — Z23 Encounter for immunization: Secondary | ICD-10-CM

## 2017-02-20 DIAGNOSIS — Z794 Long term (current) use of insulin: Secondary | ICD-10-CM

## 2017-02-20 LAB — POCT GLYCOSYLATED HEMOGLOBIN (HGB A1C): Hemoglobin A1C: 9

## 2017-02-20 MED ORDER — LISINOPRIL 2.5 MG PO TABS: 2.5000 mg | ORAL_TABLET | Freq: Every day | ORAL | 3 refills | Status: DC

## 2017-02-20 MED ORDER — TRIAMCINOLONE ACETONIDE 0.1 % EX CREA: 1.0000 "application " | TOPICAL_CREAM | Freq: Two times a day (BID) | CUTANEOUS | 12 refills | Status: DC

## 2017-02-20 MED ORDER — DULAGLUTIDE 0.75 MG/0.5ML ~~LOC~~ SOAJ: SUBCUTANEOUS | 1 refills | Status: DC

## 2017-02-20 MED FILL — TRIAMCINOLONE 0.1% CREAM: 0.1 | 30 days supply | Qty: 454 | Fill #0

## 2017-02-20 MED FILL — LISINOPRIL 2.5 MG TABLET: 2.5 | 90 days supply | Qty: 90 | Fill #0

## 2017-02-20 NOTE — Progress Notes (Signed)
Nathaniel LukesJeffrey A [name] is a 51 y.o. male who [facility] today for annual exam.  Diabetes: A1C 9.0 today. Patient with continuous glucose monitor. Isn't sure how reliable it is as he has tested it against his home glucose monitor and it is frequently off by up to 50 points. Stopped exercising 2 months ago due to arm pain and has not restarted. Discussed GLP-1 and SGLT2 drugs today - will consider. Will see endocrinologist on Friday for diabetes follow up.  HTN: well controlled on current regimen. Denies orthostasis, chest pain, palpitations.   Arm pain: states arm pain from October visit has fully resolved. Worries that exercise exacerbates pain and states that this has been a barrier to running for him.  Health Maintenance: due to pneumococcal vaccine and colon cancer screening.   Dry skin: Patient notes notes itchy dryskin in the winter. He has not tried any treatment.   Past Medical History:  Diagnosis Date  . Depression   . Diabetes mellitus    type II  . GERD (gastroesophageal reflux disease)   . Headache(784.0)   . Hypogonadism male    Past Surgical History:  Procedure Laterality Date  . NASAL SINUS SURGERY  04/1991  . TONSILLECTOMY  1972   Social History   Tobacco Use  . Smoking status: Never Smoker  . Smokeless tobacco: Never Used  Substance Use Topics  . Alcohol use: No   family history includes Cancer (age of onset: 3965) in his father; Diabetes Mellitus I in his son; Healthy in his mother. He was adopted.  ROS as above:  Medications: Current Outpatient Medications  Medication Sig Dispense Refill  . Alcohol Swabs (ALCOHOL PREPS) PADS Use alcohol pads to clean site to be used to check blood sugar. 100 each 11  . Blood Glucose Monitoring Suppl (FREESTYLE LITE) DEVI Use to check sugar 4 times daily. 1 each 0  . Continuous Blood Gluc Receiver  (FREESTYLE LIBRE READER) DEVI 1 Device by Does not apply route 3 (three) times daily. 1 Device 1  . Continuous Blood Gluc Sensor (FREESTYLE LIBRE SENSOR SYSTEM) MISC 1 Device by Does not apply route every 30 (thirty) days. 3 each 11  . glucagon (GLUCAGON EMERGENCY) 1 MG injection Inject 1 mg into the muscle once as needed. 1 each 3  . glucose blood (FREESTYLE LITE) test strip Use as instructed to check sugar 6 times daily. 600 each 5  . HUMALOG KWIKPEN 100 UNIT/ML KiwkPen INJECT 0.06-0.1 MLS (6-10 UNITS TOTAL) INTO THE SKIN 3 (THREE) TIMES DAILY. 15 mL 5  . Insulin Glargine (LANTUS SOLOSTAR) 100 UNIT/ML Solostar Pen Inject 12 units into the skin 2 times daily 30 mL 0  . JANUVIA 100 MG tablet TAKE ONE TABLET DAILY BEFORE BREAKFAST. 90 tablet 1  . Lancets (FREESTYLE) lancets Use as instructed to check sugar 4 times daily. 400 each 5  . metFORMIN (GLUCOPHAGE-XR) 500 MG 24 hr tablet Take 4 tablets (2,000 mg total) by mouth daily with supper. (Patient taking differently: Take 2,000 mg by mouth daily with breakfast. ) 360 tablet 3  . Dulaglutide (TRULICITY) 0.75 MG/0.5ML SOPN Inject into skin weekly 4 pen 1  . lisinopril (PRINIVIL,ZESTRIL) 2.5 MG tablet Take 1 tablet (2.5 mg total) by mouth daily. 90 tablet 3  . triamcinolone cream (KENALOG) 0.1 % Apply 1 application topically 2 (two) times daily. 453.6 g 12   No current facility-administered medications for this visit.  Allergies  Allergen Reactions  . Cymbalta [Duloxetine Hcl] Other (See Comments)    Suicidal thoughts  . Influenza Vaccines Other (See Comments)    Unknown reaction  . Penicillins Other (See Comments)    Unknown - childhood allergy per mother  . Pioglitazone Other (See Comments)      headache  . Promethazine Hcl Nausea And Vomiting       . Reglan [Metoclopramide] Other (See Comments)    Jittery, increased anxiety    Health Maintenance Health Maintenance  Topic Date Due  . PNEUMOCOCCAL POLYSACCHARIDE VACCINE (1)  09/03/1967  . HIV Screening  09/02/1980  . COLONOSCOPY  09/03/2015  . INFLUENZA VACCINE  10/23/2017 (Originally 10/19/2016)  . FOOT EXAM  02/21/2017  . HEMOGLOBIN A1C  05/18/2017  . OPHTHALMOLOGY EXAM  06/28/2017  . TETANUS/TDAP  03/01/2021    Exam:  BP 116/72   Pulse 70   Ht 6' (1.829 m)   Wt 193 lb (87.5 kg)   BMI 26.18 kg/m  Gen: Well NAD HEENT: EOMI,  MMM Lungs: Normal work of breathing. CTABL Heart: RRR no MRG Abd: NABS, Soft. Nondistended, Nontender Exts: Brisk capillary refill, warm and well perfused.  Skin: Occasional areas of excoriation on the upper back   Results for orders placed or performed in visit on 02/20/17 (from the past 72 hour(s))  POCT HgB A1C     Status: None   Collection Time: 02/20/17  7:58 AM  Result Value Ref Range   Hemoglobin A1C 9.0    No results found.   Assessment and Plan: 51 y.o. male here for his annual exam. Doing well without any acute complaints today.  Diabetes: sees endocrinologist on Friday. Discussed adding GLP-1 or SGLT2 inhibitor. He will consider and discuss with endocrine. Encouraged restarting running for weight loss and blood glucose control.  HTN: continue on current regimen.   Neck pain: resolved. Encouraged restarting exercise. Patient prefers to run. He will consider restarting and monitor for neck/arm symptoms.   Health maintenance: gave pneumococcal vaccine today. Patient elected Cologaurd. Will mail in sample once he ensures his insurance will pay for it.   Dry skin: Treat with triamcinolone cream  Orders Placed This Encounter  Procedures  . Pneumococcal polysaccharide vaccine 23-valent greater than or equal to 2yo subcutaneous/IM  . Cologuard  . POCT HgB A1C   Meds ordered this encounter  Medications  . lisinopril (PRINIVIL,ZESTRIL) 2.5 MG tablet    Sig: Take 1 tablet (2.5 mg total) by mouth daily.    Dispense:  90 tablet    Refill:  3  . triamcinolone cream (KENALOG) 0.1 %    Sig: Apply 1 application  topically 2 (two) times daily.    Dispense:  453.6 g    Refill:  12     Discussed warning signs or symptoms. Please see discharge instructions. Patient expresses understanding.

## 2017-02-20 NOTE — Patient Instructions (Addendum)
Thank you for coming in today. Follow up with Endocrinology.  I think restarting Trulicity may be a good idea.  I recommend trying to restart running or exercise.  Recheck with me in 6 months or sooner if needed.  You should get the cologard kit in the mail. Please return it.  Return sooner if needed.

## 2017-02-22 MED FILL — TRULICITY 0.75 MG/0.5 ML PE: 0.75 | 28 days supply | Qty: 2 | Fill #0

## 2017-02-24 ENCOUNTER — Ambulatory Visit: Payer: 59 | Admitting: Internal Medicine

## 2017-02-27 ENCOUNTER — Telehealth: Payer: Self-pay | Admitting: Pharmacist

## 2017-02-27 NOTE — Patient Outreach (Signed)
[facility] Care [facility]  02/27/2017  Mosie LukesJeffrey A Hidalgo 06-29-1965 [id number]   Spoke to patient via phone advising that disease self-management services will be transitioned from the [facility] program to either [facility] or [facility] in 2019. Ensured that he has completed the health risk assessment on the [email or web address] website. Also advised that a letter will be mailed to the home residence with details of this transition.            Will close case to [facility] To Well[facility] diabetes program  Allena Katzaroline E Welles, Pharm.D., BCPS PGY2 Ambulatory Care Pharmacy Resident Phone: [phone]

## 2017-02-28 DIAGNOSIS — Z1212 Encounter for screening for malignant neoplasm of rectum: Secondary | ICD-10-CM | POA: Diagnosis not present

## 2017-02-28 DIAGNOSIS — Z1211 Encounter for screening for malignant neoplasm of colon: Secondary | ICD-10-CM | POA: Diagnosis not present

## 2017-02-28 LAB — COLOGUARD: Cologuard: NEGATIVE

## 2017-03-03 ENCOUNTER — Encounter: Payer: Self-pay | Admitting: Family Medicine

## 2017-03-06 ENCOUNTER — Other Ambulatory Visit: Payer: Self-pay

## 2017-03-06 ENCOUNTER — Encounter: Payer: Self-pay | Admitting: *Deleted

## 2017-03-06 ENCOUNTER — Encounter: Payer: Self-pay | Admitting: Internal Medicine

## 2017-03-06 ENCOUNTER — Emergency Department
Admission: EM | Admit: 2017-03-06 | Discharge: 2017-03-06 | Disposition: A | Discharge status: Home / Self Care | Payer: 59 | Source: Home / Self Care | Attending: Family Medicine | Admitting: Family Medicine

## 2017-03-06 DIAGNOSIS — E118 Type 2 diabetes mellitus with unspecified complications: Secondary | ICD-10-CM

## 2017-03-06 DIAGNOSIS — R0789 Other chest pain: Secondary | ICD-10-CM

## 2017-03-06 DIAGNOSIS — Z794 Long term (current) use of insulin: Secondary | ICD-10-CM

## 2017-03-06 HISTORY — DX: Sleep apnea, unspecified: G47.30

## 2017-03-06 LAB — TROPONIN I: Troponin I: <0.01 ng/mL (ref <=0.0)

## 2017-03-06 LAB — POCT FASTING CBG KUC MANUAL ENTRY: POCT Glucose (KUC): 226 mg/dL — AB (ref 70–99)

## 2017-03-06 MED ORDER — ONDANSETRON 4 MG PO TBDP
4.0000 mg | ORAL_TABLET | Freq: Once | ORAL | Status: AC
  Administered 2017-03-06: 4 mg via ORAL

## 2017-03-06 MED ORDER — ONDANSETRON 4 MG PO TBDP: ORAL_TABLET | ORAL | 0 refills | Status: DC

## 2017-03-06 NOTE — ED Provider Notes (Signed)
[facility]    CSN: [id number] Arrival date & time: 03/06/17  1533     History   Chief Complaint Chief Complaint  Patient presents with  . Chest Pain    HPI Nathaniel [name] is a 51 y.o. male.   Approximately two weeks ago patient started Trulicity 0.75mg  injection once weekly.  His fasting glucose has improved but he has had persistent nausea.  His endocrinologist suggested increasing his injection interval to approximately 10 days.  He has been able to control nausea with Zofran ODT.  Today he developed increased heart rate (80 to 100) with fatigue and left side chest pressure-like sensation.  His glucose at that time was 68.  He denies sweating or shortness of breath.  Later, when he felt better, his glucose was 220.  At approximately noon his glucose again decreased to 68.  He still has vague non-radiating abnormal chest sensation.  He states that he has already decreased his pre-prandial dose of Humalog.   The history is provided by the patient.    Past Medical History:  Diagnosis Date  . Depression   . Diabetes mellitus    type II  . GERD (gastroesophageal reflux disease)   . Headache(784.0)   . Hypogonadism male   . Sleep apnea     Patient Active Problem List   Diagnosis Date Noted  . Obstructive sleep apnea 06/09/2016  . Epistaxis 06/09/2016  . Type 2 diabetes mellitus with hyperglycemia, with long-term current use of insulin (HCC) 10/02/2015  . Depression 05/15/2015  . Cervical radiculitis 01/25/2015  . BPH (benign prostatic hyperplasia) 09/12/2014  . Foot pain, right 09/05/2013  . Neck pain 02/27/2013  . Herpes zoster 02/22/2013  . Epididymal pain 12/19/2012  . Chronic headaches 09/28/2011  . Seasonal and perennial allergic rhinitis 09/28/2011  . HYPERLIPIDEMIA 12/16/2008  . FATIGUE, CHRONIC 12/16/2008  . HEADACHE 12/16/2008    Past Surgical History:  Procedure Laterality Date  . NASAL SINUS SURGERY  04/1991  . TONSILLECTOMY  1972        Home Medications    Prior to Admission medications   Medication Sig Start Date End Date Taking? Authorizing Provider  Alcohol Swabs (ALCOHOL PREPS) PADS Use alcohol pads to clean site to be used to check blood sugar. 05/02/14   Carlus PavlovGherghe, Cristina, MD  Blood Glucose Monitoring Suppl (FREESTYLE LITE) DEVI Use to check sugar 4 times daily. 04/27/16   Carlus PavlovGherghe, Cristina, MD  Continuous Blood Gluc Receiver (FREESTYLE LIBRE READER) DEVI 1 Device by Does not apply route 3 (three) times daily. 11/22/16   Carlus PavlovGherghe, Cristina, MD  Continuous Blood Gluc Sensor (FREESTYLE LIBRE SENSOR SYSTEM) MISC 1 Device by Does not apply route every 30 (thirty) days. 11/22/16   Carlus PavlovGherghe, Cristina, MD  Dulaglutide (TRULICITY) 0.75 MG/0.5ML SOPN Inject into skin weekly 02/20/17   Carlus PavlovGherghe, Cristina, MD  glucagon (GLUCAGON EMERGENCY) 1 MG injection Inject 1 mg into the muscle once as needed. 05/05/15   Carlus PavlovGherghe, Cristina, MD  glucose blood (FREESTYLE LITE) test strip Use as instructed to check sugar 6 times daily. 06/27/16   Carlus PavlovGherghe, Cristina, MD  HUMALOG KWIKPEN 100 UNIT/ML KiwkPen INJECT 0.06-0.1 MLS (6-10 UNITS TOTAL) INTO THE SKIN 3 (THREE) TIMES DAILY. 12/19/16   Carlus PavlovGherghe, Cristina, MD  Insulin Glargine (LANTUS SOLOSTAR) 100 UNIT/ML Solostar Pen Inject 12 units into the skin 2 times daily 01/20/17   Carlus PavlovGherghe, Cristina, MD  JANUVIA 100 MG tablet TAKE ONE TABLET DAILY BEFORE BREAKFAST. 12/06/16   Carlus PavlovGherghe, Cristina, MD  Lancets (FREESTYLE)  lancets Use as instructed to check sugar 4 times daily. 04/27/16   Carlus PavlovGherghe, Cristina, MD  lisinopril (PRINIVIL,ZESTRIL) 2.5 MG tablet Take 1 tablet (2.5 mg total) by mouth daily. 02/20/17   Rodolph Bongorey, Evan S, MD  metFORMIN (GLUCOPHAGE-XR) 500 MG 24 hr tablet Take 4 tablets (2,000 mg total) by mouth daily with supper. Patient taking differently: Take 2,000 mg by mouth daily with breakfast.  05/23/16   Carlus PavlovGherghe, Cristina, MD  ondansetron (ZOFRAN ODT) 4 MG disintegrating tablet Take one tab by mouth Q6hr prn  nausea.  Dissolve under tongue. 03/06/17   Lattie HawBeese, Phares Zaccone A, MD  triamcinolone cream (KENALOG) 0.1 % Apply 1 application topically 2 (two) times daily. 02/20/17   Rodolph Bongorey, Evan S, MD    Family History Family History  Adopted: Yes  Problem Relation Age of Onset  . Cancer Father 3265       Prostate cancer  . Healthy Mother   . Diabetes Mellitus I Son     Social History Social History   Tobacco Use  . Smoking status: Never Smoker  . Smokeless tobacco: Never Used  Substance Use Topics  . Alcohol use: No  . Drug use: No     Allergies   Cymbalta [duloxetine hcl]; Influenza vaccines; Penicillins; Pioglitazone; Promethazine hcl; and Reglan [metoclopramide]   Review of Systems Review of Systems  Constitutional: Positive for activity change and fatigue. Negative for appetite change, chills, diaphoresis and fever.  HENT: Negative.   Eyes: Negative.   Respiratory: Negative.   Cardiovascular: Positive for chest pain and palpitations. Negative for leg swelling.  Gastrointestinal: Positive for nausea. Negative for vomiting.  Endocrine: Negative for polydipsia and polyuria.  Genitourinary: Negative.   Musculoskeletal: Negative.   Skin: Negative.   Neurological: Negative for dizziness and headaches.     Physical Exam Triage Vital Signs ED Triage Vitals [03/06/17 1658]  Enc Vitals Group     BP 120/79     Pulse Rate 88     Resp 18     Temp 98 F (36.7 C)     Temp Source Oral     SpO2 98 %     Weight 192 lb (87.1 kg)     Height 6' (1.829 m)     Head Circumference      Peak Flow      Pain Score 4     Pain Loc      Pain Edu?      Excl. in GC?    No data found.  Updated Vital Signs BP 120/79 (BP Location: Left Arm)   Pulse 88   Temp 98 F (36.7 C) (Oral)   Resp 18   Ht 6' (1.829 m)   Wt 192 lb (87.1 kg)   SpO2 98%   BMI 26.04 kg/m   Visual Acuity Right Eye Distance:   Left Eye Distance:   Bilateral Distance:    Right Eye Near:   Left Eye Near:    Bilateral  Near:     Physical Exam Nursing notes and Vital Signs reviewed. Appearance:  Patient appears stated age, and in no acute distress.    Eyes:  Pupils are equal, round, and reactive to light and accomodation.  Extraocular movement is intact.  Conjunctivae are not inflamed   Pharynx:  Normal; moist mucous membranes  Neck:  Supple.  No adenopathy Lungs:  Clear to auscultation.  Breath sounds are equal.  Moving air well. Heart:  Regular rate and rhythm without murmurs, rubs, or gallops.  Rate 80  Abdomen:  Nontender without masses or hepatosplenomegaly.  Bowel sounds are present.  No CVA or flank tenderness.  Extremities:  No edema.  Skin: Warm and dry. No rash present.     UC Treatments / Results  Labs (all labs ordered are listed, but only abnormal results are displayed) Labs Reviewed  POCT FASTING CBG [facility] MANUAL ENTRY - Abnormal; Notable for the following components:      Result Value   POCT Glucose ([facility]) 226 (*)    All other components within normal limits  TROPONIN I    EKG  EKG Interpretation  Rate:  78 BPM PR:  216 msec QT:  360 msec QTcH:  410 msec QRSD:  86 msec QRS axis:  43 degrees Interpretation:   Sinus rhythm with first degree AV block; otherwise no acute change.       Radiology No results found.  Procedures Procedures (including critical care time)  Medications Ordered in UC Medications  ondansetron (ZOFRAN-ODT) disintegrating tablet 4 mg (4 mg Oral Given 03/06/17 1750)     Initial Impression / Assessment and Plan / UC Course  I have reviewed the triage vital signs and the nursing notes.  Pertinent labs & imaging results that were available during my care of the patient were reviewed by me and considered in my medical decision making (see chart for details).    EKG no acute changes.  As a precaution, will check troponin I. Suspect episodes of hypoglycemia and consequent tachycardia. Administered Zofran ODT 4mg  PO  For now, decrease Meformin 500mg  to  two tabs daily.  Extend length of Trulicity dosing interval as suggested by endocrinologist.  Continue to monitor glucose more frequently.   If symptoms become significantly worse during the night or over the weekend, proceed to the local emergency room.  Followup with endocrinologist as soon as possible.    Final Clinical Impressions(s) / UC Diagnoses   Final diagnoses:  Atypical chest pain  Type 2 diabetes mellitus with complication, with long-term current use of insulin [facility])    ED Discharge Orders        Ordered    ondansetron (ZOFRAN ODT) 4 MG disintegrating tablet     03/06/17 1757           Lattie Haw, MD 03/08/17 [phone]

## 2017-03-06 NOTE — ED Triage Notes (Signed)
Pt c/o elevated heart rate (80-100), fatigue, and LT sided chest pressure x today. He also c/o nausea 5 days after starting Trulicity. Denies sweating or SOB. Reports blood sugars today of 68, to 220 and back to 68 at last check at 12N.

## 2017-03-06 NOTE — Discharge Instructions (Signed)
For now, decrease Meformin 500mg  to two tabs daily.  Continue to monitor glucose more frequently.   If symptoms become significantly worse during the night or over the weekend, proceed to the local emergency room.

## 2017-03-07 ENCOUNTER — Encounter: Payer: Self-pay | Admitting: Internal Medicine

## 2017-03-07 ENCOUNTER — Telehealth: Payer: Self-pay | Admitting: *Deleted

## 2017-03-07 MED FILL — ONDANSETRON ODT 4 MG TABLET: 4 | 3 days supply | Qty: 12 | Fill #0

## 2017-03-07 NOTE — Telephone Encounter (Signed)
LM with Troponin I results and to call back if he has any questions or concerns. If still symptomatic f/u with PCP.

## 2017-03-10 ENCOUNTER — Ambulatory Visit: Payer: 59 | Admitting: Internal Medicine

## 2017-03-10 ENCOUNTER — Encounter: Payer: Self-pay | Admitting: Internal Medicine

## 2017-03-10 VITALS — BP 118/70 | HR 102 | Ht 72.0 in | Wt 191.6 lb

## 2017-03-10 DIAGNOSIS — E1165 Type 2 diabetes mellitus with hyperglycemia: Secondary | ICD-10-CM | POA: Diagnosis not present

## 2017-03-10 DIAGNOSIS — Z794 Long term (current) use of insulin: Secondary | ICD-10-CM | POA: Diagnosis not present

## 2017-03-10 MED FILL — JANUVIA 100 MG TABLET: 100 | 90 days supply | Qty: 90 | Fill #1

## 2017-03-10 MED FILL — METFORMIN HCL ER 500 MG TAB: 500 | 90 days supply | Qty: 360 | Fill #3

## 2017-03-10 NOTE — Patient Instructions (Signed)
Please stop Trulicity.   Continue Januvia 100 mg daily in am.  Continue Metformin 2000 mg daily in am.  Please try to change: - Lantus 12 units in am and 10 units at bedtime  Continue Humalog: - 7-9 units depending on the size of the meal  INJECT HUMALOG 10-15 MIN BEFORE A MEAL!  Please return in 3-4 months with your sugar log.

## 2017-03-10 NOTE — Progress Notes (Signed)
Patient ID: Nathaniel [name], male   DOB: 07/10/1965, 51 y.o.   MRN: [id number]  HPI: Nathaniel [name] is a 51 y.o.-year-old man, returning for f/u for DM2, dx 2001, insulin-dependent since 2010, uncontrolled, without complications. Last visit 4 mo ago.  Since last visit, he was in the emergency room on 03/06/2017 with chest pain.  This was believed to be happening due to hyperadrenergic response to previous hypoglycemia.  Per review of the sensor download, his sugars did not get lower than 68.  He was on a pump: Medtronic 630G >> came off and decided he does not want to be on the pump anymore.  Not running now because he has neck pain and shoulder pain.   Last hemoglobin A1c was: Lab Results  Component Value Date   HGBA1C 9.0 02/20/2017   HGBA1C 9.6 11/18/2016   HGBA1C 9.8 05/24/2016  11/08/2013: 9.5% 04/11/2013: 9.8%  Now on: - Metformin ER 2000 mg with dinner >> in am - Januvia 100 mg daily in am - started 05/2016- Trulicity 0.75 mg weekly: Started 2.5 weeks per his request >> but will have to stop b/c nausea. - Lantus 12 units in am and 12 units in pm >> Decreased to 10 units twice a day x 2 days >> now back to 12 units 2x a day - Humalog 7-9 units before meals.  He was not comfortable using the following parameters:  Stopped Tanzeum b/c N/V >> was in UC x2. Trulicity denied by insurance until he tried Victoza >> he did not want to try this.  Pt checks his sugars with the Libre CGM - ave 224 +/- 75.4 (60-448) >> 179 +/-55.7, coefficient of variation 31.1%: CGM parameters:  Time in range:  - low (<70): 2% - normal range (70-180): 52% - high sugars (>180): 46%  CGM readings  (25% to 75%): - am: 103, 132,195 >> 100-190 >> 100-130, 190 (missed Lantus) >> 110-170 - 2h after b'fast:  194-272 >> 83-309 >> 172 >> n/c >> 160-220 - before lunch: 188, 222, 279, 394 >> 130-210 >> 190s >> 140-215 - 2h after lunch:111, 136, 137, 355, 374 >> n/c >> 155-215 - before dinner: 164, 183, 311 >>  240-250 >> 200-230 >> 170-220 - 2h after dinner: exercises after dinner: 180-190s >> n/c >> 180-240, up to 300s - bedtime: 357 >> see above >> 116-224 >> see above >> 120-160     -  Nighttime: 43-83 >> 168-277 >> 214, 183 >> n/c  Pt's meals are: - Breakfast: cereal + 1% milk, with protein bar - Lunch: sandwich - Dinner: eating out or eating in - Snacks: 1-2 a day  - no CKD, but last BUN/creatinine was lower: Lab Results  Component Value Date   BUN 12 11/18/2016   CREATININE 1.12 11/18/2016   Lab Results  Component Value Date   GFRNONAA 76 11/18/2016   GFRNONAA 87 01/15/2016   GFRNONAA 83 10/13/2015   GFRNONAA 87 08/14/2015   GFRNONAA >90 05/20/2013   Lab Results  Component Value Date   MICRALBCREAT 1.3 11/18/2016   MICRALBCREAT 4.1 06/06/2014   MICRALBCREAT 5.3 12/19/2012   MICRALBCREAT 2.8 02/23/2011   MICRALBCREAT 10.2 05/28/2010   MICRALBCREAT 3.4 12/18/2008   On lisinopril.  -No HL; last set of lipids: Lab Results  Component Value Date   CHOL 155 11/18/2016   HDL 56.20 11/18/2016   LDLCALC 74 11/18/2016   TRIG 125.0 11/18/2016   CHOLHDL 3 11/18/2016   - last eye exam was 06/2016:  No DR - He denies numbness and tingling in his feet.    He also has OSA and uses a CPAP machine.  ROS: Constitutional: no weight gain/no weight loss, no fatigue, no subjective hyperthermia, no subjective hypothermia Eyes: no blurry vision, no xerophthalmia ENT: no sore throat, no nodules palpated in throat, no dysphagia, no odynophagia, no hoarseness Cardiovascular: no CP/no SOB/no palpitations/no leg swelling Respiratory: no cough/no SOB/no wheezing Gastrointestinal: no N/no V/no D/no C/no acid reflux Musculoskeletal: no muscle aches/no joint aches Skin: no rashes, no hair loss Neurological: no tremors/no numbness/no tingling/no dizziness  I reviewed pt's medications, allergies, PMH, social hx, family hx, and changes were documented in the history of present illness.  Otherwise, unchanged from my initial visit note.  PE: BP 118/70 (BP Location: Left Arm, Patient Position: Sitting, Cuff Size: Normal)   Pulse (!) 102   Ht 6' (1.829 m)   Wt 191 lb 9.6 oz (86.9 kg)   SpO2 98%   BMI 25.99 kg/m  Body mass index is 25.99 kg/m.  Wt Readings from Last 3 Encounters:  03/10/17 191 lb 9.6 oz (86.9 kg)  03/06/17 192 lb (87.1 kg)  02/20/17 193 lb (87.5 kg)   Constitutional: Normal weight, in NAD Eyes: PERRLA, EOMI, no exophthalmos ENT: moist mucous membranes, no thyromegaly, no cervical lymphadenopathy Cardiovascular: RRR, No MRG Respiratory: CTA B Gastrointestinal: abdomen soft, NT, ND, BS+ Musculoskeletal: no deformities, strength intact in all 4 Skin: moist, warm, no rashes Neurological: no tremor with outstretched hands, DTR normal in all 4  ASSESSMENT: 1. DM2, insulin-dependent, uncontrolled, without complications - Received records with C-peptide of 3.67 (0.8-3.9), from 06/16/2005 ([facility]). No associated glucose reported. - son with DM1 (not on a pump)  PLAN:   1. Patient with long-standing, uncontrolled, type 2, diabetes, most likely with a degree of insulin deficiency, on basal-bolus insulin regimen, + metformin and Trulicity.   - However, his needs to stop Trulicity due to persistent nausea.  We will continue Januvia. - His glycemic patterns are the following: Sugars are lower in the morning, but increasing especially with breakfast and dinner as he is bolusing usually after he eats.  He tells me that he is not sure how much she would eat and he is afraid of dropping his sugars if he boluses before a meal.  We discussed that bolusing after the sugars are already high is not conducive to good control, but is supporting significant variability in the sugars and is still conducive to lows.  I strongly advised him to move the boluses before meals, ideally 10-15 minutes before.   This is especially important at night since he can drop his sugars if  he boluses after dinner.  To avoid abrupt decrease in blood sugars overnight, as he is experiencing now), I will also decrease the Lantus from 12 units to 10 units at night.  We also discussed about rotation of injection sites and not to inject in the site that is scarred.  I advised him to also try to use the size especially with mealtime insulin - He continues the Freestyle Libre CGM and we reviewed the downloads together at this visit - We reviewed together his latest HbA1c obtained earlier this month, which was 9%, improved. - We reviewed what happened before his chest pain episode on 03/06/2017.  He felt his sugars dropping but he was in a meeting with the client that he could not get out to check his sugars and eat something sweet.  After an hour of  hypoglycemia, he started to have palpitations and then chest pain and had to go to the emergency room.  I strongly advised him that he absolutely needs to excuse himself and go to a place where he can check his sugars with a libre CGM and eat fast carbs to correct his blood sugars if they are low. - I advised him to: Patient Instructions  Please stop Trulicity.   Continue Januvia 100 mg daily in am.  Continue Metformin 2000 mg daily in am.  Please try to change: - Lantus 12 units in am and 10 units at bedtime  Continue Humalog: - 7-9 units depending on the size of the meal  INJECT HUMALOG 10-15 MIN BEFORE A MEAL!  Please return in 3-4 months with your sugar log.   - continue checking sugars at different times of the day with The CGM - advised for yearly eye exams >> he is UTD - Return to clinic in 3-4 mo with sugar log   - time spent with the patient: 40 min, of which >50% was spent in reviewing his  CGM downloads, reviewing his current medication doses, discussing his hypo- and hyper-glycemic episodes, reviewing previous labs and ED visit note and developing a plan to avoid further hypo- and hyper-glycemia.    Carlus Pavlov, MD  PhD [facility] Endocrinology

## 2017-03-17 MED ORDER — INSULIN GLARGINE 100 UNIT/ML SOLOSTAR PEN: PEN_INJECTOR | SUBCUTANEOUS | 0 refills | Status: DC

## 2017-03-17 MED FILL — FREESTYLE LIBRE SENSOR SYST: 30 days supply | Qty: 3 | Fill #4

## 2017-03-17 NOTE — Telephone Encounter (Signed)
Pt stated he needs to speak to someone about the Lancets  He mentioned him and his son needing them  PLEASE ADVISE  Lancets (FREESTYLE) lancets

## 2017-03-24 ENCOUNTER — Ambulatory Visit: Payer: 59 | Admitting: Pharmacist

## 2017-03-30 ENCOUNTER — Encounter: Payer: Self-pay | Admitting: Family Medicine

## 2017-04-01 ENCOUNTER — Encounter: Payer: Self-pay | Admitting: Family Medicine

## 2017-04-11 MED FILL — LANTUS SOLOSTAR 100 UNITS/M: 100 | 37 days supply | Qty: 9 | Fill #1

## 2017-04-11 MED FILL — HUMALOG 100 UNITS/ML KWIKPE: 100 | 50 days supply | Qty: 15 | Fill #1

## 2017-04-25 ENCOUNTER — Telehealth: Payer: Self-pay | Admitting: Internal Medicine

## 2017-04-25 NOTE — Telephone Encounter (Signed)
Patient called re: Pharmacy told patient that Dr Elvera LennoxGherghe needs to do a prior authorization for the [facility]. [facility]

## 2017-04-25 NOTE — Telephone Encounter (Signed)
[facility] is following up about prior auth for patients medication   Continuous Blood Gluc Sensor (FREESTYLE LIBRE SENSOR SYSTEM) MISC  Please advise [phone]

## 2017-04-26 NOTE — Telephone Encounter (Signed)
Called and asked that this form be refaxed we have not gotten it

## 2017-04-26 NOTE — Telephone Encounter (Signed)
[facility] is calling about pts script that she is working on [employer] for Continuous Blood Gluc Sensor (FREESTYLE LIBRE SENSOR SYSTEM) MISC  Faxed over questions on 2/4  Jess  [phone] PA # [phone]

## 2017-04-26 NOTE — Telephone Encounter (Signed)
This PA has been iniated

## 2017-05-03 MED FILL — FREESTYLE LIBRE SENSOR SYST: 30 days supply | Qty: 3 | Fill #5

## 2017-05-11 ENCOUNTER — Emergency Department
Admission: EM | Admit: 2017-05-11 | Discharge: 2017-05-11 | Disposition: A | Discharge status: Home / Self Care | Payer: 59 | Source: Home / Self Care | Attending: Family Medicine | Admitting: Family Medicine

## 2017-05-11 ENCOUNTER — Other Ambulatory Visit: Payer: Self-pay

## 2017-05-11 ENCOUNTER — Encounter: Payer: Self-pay | Admitting: Emergency Medicine

## 2017-05-11 DIAGNOSIS — J40 Bronchitis, not specified as acute or chronic: Secondary | ICD-10-CM

## 2017-05-11 MED ORDER — BENZONATATE 200 MG PO CAPS: ORAL_CAPSULE | ORAL | 0 refills | Status: DC

## 2017-05-11 MED ORDER — DOXYCYCLINE HYCLATE 100 MG PO CAPS: 100.0000 mg | ORAL_CAPSULE | Freq: Two times a day (BID) | ORAL | 0 refills | Status: DC

## 2017-05-11 NOTE — Discharge Instructions (Signed)
Take plain guaifenesin (1200mg extended release tabs such as Mucinex) twice daily, with plenty of water, for cough and congestion.  Get adequate rest.   °May use Afrin nasal spray (or generic oxymetazoline) each morning for about 5 days and then discontinue.  Also recommend using saline nasal spray several times daily and saline nasal irrigation (AYR is a common brand).  Use Flonase nasal spray each morning after using Afrin nasal spray and saline nasal irrigation. °Try warm salt water gargles for sore throat.  °Stop all antihistamines for now, and other non-prescription cough/cold preparations. °  °  °

## 2017-05-11 NOTE — ED Provider Notes (Signed)
[facility]    CSN: [id number] Arrival date & time: 05/11/17  1654     History   Chief Complaint Chief Complaint  Patient presents with  . Cough  . Nasal Congestion    HPI Nathaniel [name] is a 52 y.o. male.   Patient developed a dry cough four days ago with sinus congestion but no sore throat.  He has had myalgias and fatigue.  He denies pleuritic pain and shortness of breath. He has a past history of pneumonia (1995)   The history is provided by the patient.    Past Medical History:  Diagnosis Date  . Depression   . Diabetes mellitus    type II  . GERD (gastroesophageal reflux disease)   . Headache(784.0)   . Hypogonadism male   . Sleep apnea     Patient Active Problem List   Diagnosis Date Noted  . Obstructive sleep apnea 06/09/2016  . Epistaxis 06/09/2016  . Type 2 diabetes mellitus with hyperglycemia, with long-term current use of insulin (HCC) 10/02/2015  . Depression 05/15/2015  . Cervical radiculitis 01/25/2015  . BPH (benign prostatic hyperplasia) 09/12/2014  . Foot pain, right 09/05/2013  . Neck pain 02/27/2013  . Herpes zoster 02/22/2013  . Epididymal pain 12/19/2012  . Chronic headaches 09/28/2011  . Seasonal and perennial allergic rhinitis 09/28/2011  . HYPERLIPIDEMIA 12/16/2008  . FATIGUE, CHRONIC 12/16/2008  . HEADACHE 12/16/2008    Past Surgical History:  Procedure Laterality Date  . NASAL SINUS SURGERY  04/1991  . TONSILLECTOMY  1972       Home Medications    Prior to Admission medications   Medication Sig Start Date End Date Taking? Authorizing Provider  Alcohol Swabs (ALCOHOL PREPS) PADS Use alcohol pads to clean site to be used to check blood sugar. 05/02/14   Carlus Pavlov, MD  benzonatate (TESSALON) 200 MG capsule Take one cap by mouth at bedtime as needed for cough.  May repeat in 4 to 6 hours 05/11/17   Lattie Haw, MD  Blood Glucose Monitoring Suppl (FREESTYLE LITE) DEVI Use to check sugar 4 times daily.  04/27/16   Carlus Pavlov, MD  Continuous Blood Gluc Receiver (FREESTYLE LIBRE READER) DEVI 1 Device by Does not apply route 3 (three) times daily. 11/22/16   Carlus Pavlov, MD  Continuous Blood Gluc Sensor (FREESTYLE LIBRE SENSOR SYSTEM) MISC 1 Device by Does not apply route every 30 (thirty) days. 11/22/16   Carlus Pavlov, MD  doxycycline (VIBRAMYCIN) 100 MG capsule Take 1 capsule (100 mg total) by mouth 2 (two) times daily. Take with food. 05/11/17   Lattie Haw, MD  Dulaglutide (TRULICITY) 0.75 MG/0.5ML SOPN Inject into skin weekly 02/20/17   Carlus Pavlov, MD  glucagon (GLUCAGON EMERGENCY) 1 MG injection Inject 1 mg into the muscle once as needed. 05/05/15   Carlus Pavlov, MD  glucose blood (FREESTYLE LITE) test strip Use as instructed to check sugar 6 times daily. 06/27/16   Carlus Pavlov, MD  HUMALOG KWIKPEN 100 UNIT/ML KiwkPen INJECT 0.06-0.1 MLS (6-10 UNITS TOTAL) INTO THE SKIN 3 (THREE) TIMES DAILY. 12/19/16   Carlus Pavlov, MD  Insulin Glargine (LANTUS SOLOSTAR) 100 UNIT/ML Solostar Pen Inject 12 units into the skin 2 times daily 03/17/17   Carlus Pavlov, MD  JANUVIA 100 MG tablet TAKE ONE TABLET DAILY BEFORE BREAKFAST. 12/06/16   Carlus Pavlov, MD  Lancets (FREESTYLE) lancets Use as instructed to check sugar 4 times daily. 04/27/16   Carlus Pavlov, MD  lisinopril (PRINIVIL,ZESTRIL) 2.5  MG tablet Take 1 tablet (2.5 mg total) by mouth daily. 02/20/17   Rodolph Bongorey, Evan S, MD  metFORMIN (GLUCOPHAGE-XR) 500 MG 24 hr tablet Take 4 tablets (2,000 mg total) by mouth daily with supper. Patient taking differently: Take 2,000 mg by mouth daily with breakfast.  05/23/16   Carlus PavlovGherghe, Cristina, MD  ondansetron (ZOFRAN ODT) 4 MG disintegrating tablet Take one tab by mouth Q6hr prn nausea.  Dissolve under tongue. 03/06/17   Lattie HawBeese, Tyreck Bell A, MD  triamcinolone cream (KENALOG) 0.1 % Apply 1 application topically 2 (two) times daily. 02/20/17   Rodolph Bongorey, Evan S, MD    Family  History Family History  Adopted: Yes  Problem Relation Age of Onset  . Cancer Father 3465       Prostate cancer  . Healthy Mother   . Diabetes Mellitus I Son     Social History Social History   Tobacco Use  . Smoking status: Never Smoker  . Smokeless tobacco: Never Used  Substance Use Topics  . Alcohol use: No  . Drug use: No     Allergies   Cymbalta [duloxetine hcl]; Influenza vaccines; Penicillins; Pioglitazone; Promethazine hcl; and Reglan [metoclopramide]   Review of Systems Review of Systems No sore throat + cough No pleuritic pain No wheezing + nasal congestion + post-nasal drainage No sinus pain/pressure No itchy/red eyes No earache No hemoptysis No SOB No fever, + chills No nausea No vomiting No abdominal pain No diarrhea No urinary symptoms No skin rash + fatigue + myalgias + headache Used OTC meds without relief   Physical Exam Triage Vital Signs ED Triage Vitals  Enc Vitals Group     BP 05/11/17 1727 111/72     Pulse Rate 05/11/17 1727 78     Resp 05/11/17 1727 18     Temp 05/11/17 1727 97.9 F (36.6 C)     Temp Source 05/11/17 1727 Oral     SpO2 05/11/17 1727 98 %     Weight 05/11/17 1728 189 lb (85.7 kg)     Height 05/11/17 1728 6\' 1"  (1.854 m)     Head Circumference --      Peak Flow --      Pain Score --      Pain Loc --      Pain Edu? --      Excl. in GC? --    No data found.  Updated Vital Signs BP 111/72 (BP Location: Right Arm)   Pulse 78   Temp 97.9 F (36.6 C) (Oral)   Resp 18   Ht 6\' 1"  (1.854 m)   Wt 189 lb (85.7 kg)   SpO2 98%   BMI 24.94 kg/m   Visual Acuity Right Eye Distance:   Left Eye Distance:   Bilateral Distance:    Right Eye Near:   Left Eye Near:    Bilateral Near:     Physical Exam Nursing notes and Vital Signs reviewed. Appearance:  Patient appears stated age, and in no acute distress Eyes:  Pupils are equal, round, and reactive to light and accomodation.  Extraocular movement is intact.   Conjunctivae are not inflamed  Ears:  Canals normal.  Tympanic membranes normal.  Nose:  Mildly congested turbinates.  No sinus tenderness.    Pharynx:  Normal Neck:  Supple.  No adenopathy. Lungs:  Clear to auscultation.  Breath sounds are equal.  Moving air well. Heart:  Regular rate and rhythm without murmurs, rubs, or gallops.  Abdomen:  Nontender without masses  or hepatosplenomegaly.  Bowel sounds are present.  No CVA or flank tenderness.  Extremities:  No edema.  Skin:  No rash present.    UC Treatments / Results  Labs (all labs ordered are listed, but only abnormal results are displayed) Labs Reviewed - No data to display  EKG  EKG Interpretation None       Radiology No results found.  Procedures Procedures (including critical care time)  Medications Ordered in UC Medications - No data to display   Initial Impression / Assessment and Plan / UC Course  I have reviewed the triage vital signs and the nursing notes.  Pertinent labs & imaging results that were available during my care of the patient were reviewed by me and considered in my medical decision making (see chart for details).    Begin doxycycline 100mg  BID for atypical coverage. Prescription written for Benzonatate [facility]) to take at bedtime for night-time cough.  Take plain guaifenesin (1200mg  extended release tabs such as Mucinex) twice daily, with plenty of water, for cough and congestion.  Get adequate rest.   May use Afrin nasal spray (or generic oxymetazoline) each morning for about 5 days and then discontinue.  Also recommend using saline nasal spray several times daily and saline nasal irrigation (AYR is a common brand).  Use Flonase nasal spray each morning after using Afrin nasal spray and saline nasal irrigation. Try warm salt water gargles for sore throat.  Stop all antihistamines for now, and other non-prescription cough/cold preparations. Followup with Family Doctor if not improved in one  week.     Final Clinical Impressions(s) / UC Diagnoses   Final diagnoses:  Bronchitis    ED Discharge Orders        Ordered    doxycycline (VIBRAMYCIN) 100 MG capsule  2 times daily     05/11/17 1802    benzonatate (TESSALON) 200 MG capsule     05/11/17 1802           Lattie Haw, MD 05/14/17 1217

## 2017-05-11 NOTE — ED Triage Notes (Addendum)
Patient reports cough and congestion for past 4 days; no known fever. Took ibuprofen, tussinex and claritan at 1500. States BG At 1500 was 374; took 4 units insulin; always more labile when ill.

## 2017-05-26 MED FILL — FREESTYLE LIBRE SENSOR SYST: 30 days supply | Qty: 3 | Fill #6

## 2017-05-26 MED FILL — LISINOPRIL 2.5 MG TABLET: 2.5 | 90 days supply | Qty: 90 | Fill #1

## 2017-05-30 ENCOUNTER — Ambulatory Visit (HOSPITAL_COMMUNITY)
Admission: EM | Admit: 2017-05-30 | Discharge: 2017-05-30 | Disposition: A | Discharge status: Home / Self Care | Payer: 59

## 2017-05-30 NOTE — ED Notes (Signed)
Left department per patient access

## 2017-06-01 ENCOUNTER — Ambulatory Visit: Payer: 59 | Admitting: Family Medicine

## 2017-06-01 ENCOUNTER — Encounter: Payer: Self-pay | Admitting: Family Medicine

## 2017-06-01 VITALS — BP 125/81 | HR 78 | Ht 72.0 in | Wt 193.0 lb

## 2017-06-01 DIAGNOSIS — S9032XA Contusion of left foot, initial encounter: Secondary | ICD-10-CM

## 2017-06-01 NOTE — Patient Instructions (Addendum)
Thank you for coming in today. Start the ankle stability program.  Use the gel heel cup as needed.  You can run on the 30th if you want.  Recheck with me as needed.   Heel contusion.    Ankle Sprain, Phase II Rehab Ask your health care provider which exercises are safe for you. Do exercises exactly as told by your health care provider and adjust them as directed. It is normal to feel mild stretching, pulling, tightness, or discomfort as you do these exercises, but you should stop right away if you feel sudden pain or your pain gets worse.Do not begin these exercises until told by your health care provider. Stretching and range of motion exercises These exercises warm up your muscles and joints and improve the movement and flexibility of your lower leg and ankle. These exercises also help to relieve pain and stiffness. Exercise A: Gastroc stretch, standing  1. Stand with your hands against a wall. 2. Extend your left / right leg behind you, and bend your front knee slightly. Your heels should be on the floor. 3. Keeping your heels on the floor and your back knee straight, shift your weight toward the wall. You should feel a gentle stretch in the back of your lower leg (calf). 4. Hold this position for __________ seconds. Repeat __________ times. Complete this exercise __________ times a day. Exercise B: Soleus stretch, standing 1. Stand with your hands against a wall. 2. Extend your left / right leg behind you, and bend your front knee slightly. Both of your heels should be on the floor. 3. Keeping your heels on the floor, bend your back knee and shift your weight slightly over your back leg. You should feel a gentle stretch deep in your calf. 4. Hold this position for __________ seconds. Repeat __________ times. Complete this exercise __________ times a day. Strengthening exercises These exercises build strength and endurance in your lower leg. Endurance is the ability to use your muscles  for a long time, even after they get tired. Exercise C: Heel walking ( dorsiflexion) Walk on your heels for __________ seconds or ___________ ft. Keep your toes as high as possible. Repeat __________ times. Complete this exercise __________ times a day. Balance exercises These exercises improve your balance and the reaction and control of your ankle to help improve stability. Exercise D: Multi-angle lunge 1. Stand with your feet together. 2. Take a step forward with your left / right leg, and shift your weight onto that leg. Your back heel will come off the floor, and your back toes will stay in place. 3. Push off your front leg to return your front foot to the starting position next to your other foot. 4. Repeat to the side, to the back, and any other directions as told by your health care provider. Repeat in each direction __________ times. Complete this exercise __________ times a day. Exercise E: Single leg stand 1. Without shoes, stand near a railing or in a door frame. Hold onto the railing or door frame as needed. 2. Stand on your left / right foot. Keep your big toe down on the floor and try to keep your arch lifted. 3. Hold this position for __________ seconds. Repeat __________ times. Complete this exercise __________ times a day. If this exercise is too easy, you can try it with your eyes closed or while standing on a pillow. Exercise F: Inversion/eversion  You will need a balance board for this exercise. Ask your health  care provider where you can get a balance board or how you can make one. 1. Stand on a non-carpeted surface near a countertop or wall. 2. Step onto the balance board so your feet are hip-width apart. 3. Keep your feet in place and keep your upper body and hips steady. Using only your feet and ankles to move the board, do one or both of the following exercises as told by your health care provider: ? Tip the board side to side as far as you can, alternating between  tipping to the left and tipping to the right. If you can, tip the board so it silently taps the floor. Do not let the board forcefully hit the floor. From time to time, pause to hold a steady position. ? Tip the board side to side so the board does not hit the floor at all. From time to time, pause to hold a steady position. Repeat the movement for each exercise __________ times. Complete each exercise __________ times a day. Exercise G: Plantar flexion/dorsiflexion  You will need a balance board for this exercise. Ask your health care provider where you can get a balance board or how you can make one. 1. Stand on a non-carpeted surface near a countertop or wall. 2. Step onto the balance board so your feet are hip-width apart. 3. Keep your feet in place and keep your upper body and hips steady. Using only your feet and ankles to move the board, do one or both of the following exercises as told by your health care provider: ? Tip the board forward and backward so the board silently taps the floor. Do not let the board forcefully hit the floor. From time to time, pause to hold a steady position. ? Tip the board forward and backward so the board does not hit the floor at all. From time to time, pause to hold a steady position. Repeat the movement for each exercise __________ times. Complete each exercise __________ times a day. This information is not intended to replace advice given to you by your health care provider. Make sure you discuss any questions you have with your health care provider. Document Released: 06/27/2005 Document Revised: 11/12/2015 Document Reviewed: 01/19/2015 Elsevier Interactive Patient Education  2018 [employer]

## 2017-06-01 NOTE — Progress Notes (Signed)
Nathaniel LukesJeffrey A [name] is a 52 y.o. male who [facility] today for ankle and heel injury.  [name] was in his normal state of health this weekend when he was doing a trail run.  He normally does not run on trails.  He denies any specific incident but notes that he tripped a lot and fell down once or twice.  He got home he noted pain on along the posterior lateral ankle which has since resolved.  Now he notes pain at the plantar posterior aspect of the calcaneus.  He denies any pain along the Achilles tendon.  He notes pain is mild to moderate but does interfere with running.  [place] of [place].  He feels well otherwise with no fevers or chills.   Past Medical History:  Diagnosis Date  . Depression   . Diabetes mellitus    type II  . GERD (gastroesophageal reflux disease)   . Headache(784.0)   . Hypogonadism male   . Sleep apnea    Past Surgical History:  Procedure Laterality Date  . NASAL SINUS SURGERY  04/1991  . TONSILLECTOMY  1972   Social History   Tobacco Use  . Smoking status: Never Smoker  . Smokeless tobacco: Never Used  Substance Use Topics  . Alcohol use: No     ROS:  As above   Medications: Current Outpatient Medications  Medication Sig Dispense Refill  . Alcohol Swabs (ALCOHOL PREPS) PADS Use alcohol pads to clean site to be used to check blood sugar. 100 each 11  . benzonatate (TESSALON) 200 MG capsule Take one cap by mouth at bedtime as needed for cough.  May repeat in 4 to 6 hours 15 capsule 0  . Blood Glucose Monitoring Suppl (FREESTYLE LITE) DEVI Use to check sugar 4 times daily. 1 each 0  . Continuous Blood Gluc Receiver (FREESTYLE LIBRE READER) DEVI 1 Device by Does not apply route 3 (three) times daily. 1 Device 1  . Continuous Blood Gluc Sensor (FREESTYLE LIBRE SENSOR SYSTEM) MISC 1 Device by Does not apply route every 30 (thirty) days. 3 each 11  .  doxycycline (VIBRAMYCIN) 100 MG capsule Take 1 capsule (100 mg total) by mouth 2 (two) times daily. Take with food. 14 capsule 0  . Dulaglutide (TRULICITY) 0.75 MG/0.5ML SOPN Inject into skin weekly 4 pen 1  . glucagon (GLUCAGON EMERGENCY) 1 MG injection Inject 1 mg into the muscle once as needed. 1 each 3  . glucose blood (FREESTYLE LITE) test strip Use as instructed to check sugar 6 times daily. 600 each 5  . HUMALOG KWIKPEN 100 UNIT/ML KiwkPen INJECT 0.06-0.1 MLS (6-10 UNITS TOTAL) INTO THE SKIN 3 (THREE) TIMES DAILY. 15 mL 5  . Insulin Glargine (LANTUS SOLOSTAR) 100 UNIT/ML Solostar Pen Inject 12 units into the skin 2 times daily 30 mL 0  . JANUVIA 100 MG tablet TAKE ONE TABLET DAILY BEFORE BREAKFAST. 90 tablet 1  . Lancets (FREESTYLE) lancets Use as instructed to check sugar 4 times daily. 400 each 5  . lisinopril (PRINIVIL,ZESTRIL) 2.5 MG tablet Take 1 tablet (2.5 mg total) by mouth daily. 90 tablet 3  . metFORMIN (GLUCOPHAGE-XR) 500 MG 24 hr tablet Take 4 tablets (2,000 mg total) by mouth daily with supper. (Patient taking differently: Take 2,000 mg by mouth daily with breakfast. ) 360 tablet 3  . ondansetron (ZOFRAN ODT) 4 MG disintegrating tablet Take one  tab by mouth Q6hr prn nausea.  Dissolve under tongue. 12 tablet 0  . triamcinolone cream (KENALOG) 0.1 % Apply 1 application topically 2 (two) times daily. 453.6 g 12   No current facility-administered medications for this visit.    Allergies  Allergen Reactions  . Cymbalta [Duloxetine Hcl] Other (See Comments)    Suicidal thoughts  . Influenza Vaccines Other (See Comments)    Unknown reaction  . Penicillins Other (See Comments)    Unknown - childhood allergy per mother  . Pioglitazone Other (See Comments)      headache  . Promethazine Hcl Nausea And Vomiting       . Reglan [Metoclopramide] Other (See Comments)    Jittery, increased anxiety     Exam:  BP 125/81   Pulse 78   Ht 6' (1.829 m)   Wt 193 lb (87.5 kg)   BMI  26.18 kg/m  General: Well Developed, well nourished, and in no acute distress.  Neuro/Psych: Alert and oriented x3, extra-ocular muscles intact, able to move all 4 extremities, sensation grossly intact. Skin: Warm and dry, no rashes noted.  Respiratory: Not using accessory muscles, speaking in full sentences, trachea midline.  Cardiovascular: Pulses palpable, no extremity edema. Abdomen: Does not appear distended. MSK:  Left foot and ankle normal-appearing no swelling or erythema. Nontender lateral knee. Nontender along the calf and shin Nontender at the medial malleolus and lateral malleolus. Nontender at the ATFL insertion Mildly tender to palpation at the plantar aspect of the posterior calcaneus with no masses palpated Nontender along the midfoot first toe and fifth metatarsal. Ankle and foot motion are normal Ankle and foot strength are intact. Pulses capillary refill and sensation are intact Normal gait.    No results found for this or any previous visit (from the past 48 hour(s)). No results found.    Assessment and Plan: 52 y.o. male with injury to the right plantar calcaneus likely contusion.  Doubtful for tendinopathy as patient is nontender at the insertion of the commonly injured tendons and has normal motion without pain. Calcaneus contusion is the most likely explanation.  Patient was given a gel heel cup and felt better. He notes that he has a history of recurrent ankle sprains.  We discussed some home exercises that he can do including some stability exercises.  Additionally offered formal physical therapy but he would like to think about it.  Recheck as needed.     No orders of the defined types were placed in this encounter.  No orders of the defined types were placed in this encounter.   Discussed warning signs or symptoms. Please see discharge instructions. Patient expresses understanding.

## 2017-06-02 MED FILL — LANTUS SOLOSTAR 100 UNITS/M: 100 | 88 days supply | Qty: 21 | Fill #0

## 2017-06-02 MED FILL — FREESTYLE LITE TEST STRIP: 83 days supply | Qty: 500 | Fill #2

## 2017-06-06 DIAGNOSIS — G4733 Obstructive sleep apnea (adult) (pediatric): Secondary | ICD-10-CM | POA: Diagnosis not present

## 2017-06-11 ENCOUNTER — Encounter: Payer: Self-pay | Admitting: Family Medicine

## 2017-06-13 ENCOUNTER — Other Ambulatory Visit: Payer: Self-pay | Admitting: Internal Medicine

## 2017-06-13 MED FILL — METFORMIN HCL ER 500 MG TAB: 500 | 90 days supply | Qty: 360 | Fill #0

## 2017-06-13 MED FILL — JANUVIA 100 MG TABLET: 100 | 90 days supply | Qty: 90 | Fill #0

## 2017-06-16 ENCOUNTER — Ambulatory Visit: Payer: 59 | Admitting: Internal Medicine

## 2017-06-16 ENCOUNTER — Encounter: Payer: Self-pay | Admitting: Internal Medicine

## 2017-06-16 VITALS — BP 122/78 | HR 73 | Ht 72.0 in | Wt 189.6 lb

## 2017-06-16 DIAGNOSIS — Z794 Long term (current) use of insulin: Secondary | ICD-10-CM | POA: Diagnosis not present

## 2017-06-16 DIAGNOSIS — E1165 Type 2 diabetes mellitus with hyperglycemia: Secondary | ICD-10-CM

## 2017-06-16 LAB — POCT GLYCOSYLATED HEMOGLOBIN (HGB A1C): Hemoglobin A1C: 9.8

## 2017-06-16 MED ORDER — INSULIN DEGLUDEC 200 UNIT/ML ~~LOC~~ SOPN: 28.0000 [IU] | PEN_INJECTOR | Freq: Every day | SUBCUTANEOUS | 11 refills | Status: DC

## 2017-06-16 MED FILL — TRESIBA FLEXTOUCH 200 UNITS: 200 | 65 days supply | Qty: 9 | Fill #0

## 2017-06-16 NOTE — Patient Instructions (Addendum)
Please continue: - Januvia 100 mg daily in am. - Metformin 2000 mg daily in am. - Humalog: 7-9 units depending on the size of the meal Inject Humalog 10-15 minutes before a meal.  Please change Lantus to [place]iba U200: 28 units.  Please return in 3-4 months with your sugar log.

## 2017-06-16 NOTE — Progress Notes (Signed)
Patient ID: Nathaniel [name], male   DOB: 1965/09/18, 52 y.o.   MRN: [id number]  HPI: Nathaniel [name] is a 52 y.o.-year-old man, returning for f/u for DM2, dx 2001, insulin-dependent since 2010, uncontrolled, without complications. Last visit 4 mo ago.  Since last visit, he was in the emergency room with bronchitis. Sugars higher then.  He increased his Lantus 3 weeks ago.  He he was on an insulin pump: Medtronic 630 G but he came off and decided that he does not want to pump anymore. Last hemoglobin A1c was: Lab Results  Component Value Date   HGBA1C 9.0 02/20/2017   HGBA1C 9.6 11/18/2016   HGBA1C 9.8 05/24/2016  11/08/2013: 9.5% 04/11/2013: 9.8%  Now on: - Januvia 100 mg daily in am. - Metformin 2000 mg daily in am. - Lantus 12 >> 14 units in am and 10 >> 14 units at bedtime - Humalog: 7-9 units depending on the size of the meal (we tried insulin to carb ratios and sensitivity factors in the past but this did not work well for him). He feels he is underdosing frequently due to fear of hypoglycemia. Also, forgets doses.  Inject Humalog 10-15 minutes before a meal.  Stopped Tanzeum b/c N/V >> was in UC x2. Trulicity denied by insurance until he tried Victoza >> he did not want to try this.  Pt checks his sugars with the Libre CGM - ave 224 +/- 75.4 (60-448) >> 179 +/-55.7, coefficient of variation 31.1% >> ? now  Sugars - am:  100-130, 190 (missed Lantus) >> 110-170  >> 100-140 - 2h after b'fast: 172 >> n/c >> 160-220 >>  - before lunch: 130-210 >> 190s >> 140-215 - 2h after lunch:111, 136, 137, 355, 374 >> n/c >> 155-215 >>  - before dinner:  240-250 >> 200-230 >> 170-220 >>  - 2h after dinner:  180-240, up to 300s - bedtime: 116-224 >> see above >> 120-160     -  Nighttime: 43-83 >> 168-277 >> 214, 183 >> n/c  Pt's meals are: - Breakfast: cereal + 1% milk, with protein bar - Lunch: sandwich - Dinner: eating out or eating in - Snacks: 1-2 a day  -No CKD, but last  BUN/creatinine was lower: Lab Results  Component Value Date   BUN 12 11/18/2016   CREATININE 1.12 11/18/2016   Lab Results  Component Value Date   GFRNONAA 76 11/18/2016   GFRNONAA 87 01/15/2016   GFRNONAA 83 10/13/2015   GFRNONAA 87 08/14/2015   GFRNONAA >90 05/20/2013   Lab Results  Component Value Date   MICRALBCREAT 1.3 11/18/2016   MICRALBCREAT 4.1 06/06/2014   MICRALBCREAT 5.3 12/19/2012   MICRALBCREAT 2.8 02/23/2011   MICRALBCREAT 10.2 05/28/2010   MICRALBCREAT 3.4 12/18/2008   On lisinopril.  -No HL; last set of lipids: Lab Results  Component Value Date   CHOL 155 11/18/2016   HDL 56.20 11/18/2016   LDLCALC 74 11/18/2016   TRIG 125.0 11/18/2016   CHOLHDL 3 11/18/2016   - last eye exam was 06/2016: No DR -No numbness and tingling in his feet.   He also has OSA and uses a CPAP machine.  He restarted running  - will have a 5K tomorrow.  ROS: Constitutional: no weight gain/no weight loss, + fatigue, no subjective hyperthermia, no subjective hypothermia Eyes: no blurry vision, no xerophthalmia ENT: no sore throat, no nodules palpated in throat, no dysphagia, no odynophagia, no hoarseness Cardiovascular: no CP/no SOB/no palpitations/no leg swelling Respiratory: no cough/no  SOB/no wheezing Gastrointestinal: no N/no V/no D/no C/no acid reflux Musculoskeletal: no muscle aches/no joint aches Skin: no rashes, no hair loss Neurological: no tremors/no numbness/no tingling/no dizziness  I reviewed pt's medications, allergies, PMH, social hx, family hx, and changes were documented in the history of present illness. Otherwise, unchanged from my initial visit note.  PE: BP 122/78   Pulse 73   Ht 6' (1.829 m)   Wt 189 lb 9.6 oz (86 kg)   SpO2 96%   BMI 25.71 kg/m  Body mass index is 25.71 kg/m.  Wt Readings from Last 3 Encounters:  06/16/17 189 lb 9.6 oz (86 kg)  06/01/17 193 lb (87.5 kg)  05/11/17 189 lb (85.7 kg)   Constitutional: Normal weight, in  NAD Eyes: PERRLA, EOMI, no exophthalmos ENT: moist mucous membranes, no thyromegaly, no cervical lymphadenopathy Cardiovascular: RRR, No MRG Respiratory: CTA B Gastrointestinal: abdomen soft, NT, ND, BS+ Musculoskeletal: no deformities, strength intact in all 4 Skin: moist, warm, no rashes Neurological: no tremor with outstretched hands, DTR normal in all 4  ASSESSMENT: 1. DM2, insulin-dependent, uncontrolled, without complications - Received records with C-peptide of 3.67 (0.8-3.9), from 06/16/2005 ([facility]). No associated glucose reported. - son with DM1 (not on a pump)  PLAN:   1. Patient with long-standing, uncontrolled, type 2 diabetes, most likely with a degree of insulin deficiency, on basal-bolus insulin regimen, and also metformin and Januvia.  At last visit, we had to stop Trulicity due to persistent nausea.  At that time, his sugars were lower in the morning and they were increasing especially after breakfast and dinner as he was bolusing usually after he was eating.  He was afraid to bolus before as he did not know how much he would eat.  We discussed that it is very important to try to inject short-acting insulin 10-15 minutes before a meal.  This is especially important for him since he occasionally had low sugars after dinner.  At last visit, we also decreased his Lantus dose to avoid dropping sugars overnight and we discussed about rotation of injection sites -He continues the freestyle libre CGM, but does not have his reader today -we discussed about not underdosing and taking the recommended Humalog doses, which he feels work well whenever he takes them. We also discussed about starting [place]-Bissauresiba U200 instead of Lantus which has a better hypoglycemia profile  - also discussed using the sides of his abdomen for injecting insulin - I advised him to: Patient Instructions  Please continue: - Januvia 100 mg daily in am. - Metformin 2000 mg daily in am. - Humalog: 7-9 units  depending on the size of the meal Inject Humalog 10-15 minutes before a meal.  Please change Lantus to Guinea-Bissauresiba U200: 28 units.  Please return in 3-4 months with your sugar log.   - today, HbA1c is 9.8% (higher) - continue checking sugars at different times of the day -he checks many times with his CGM - advised for yearly eye exams >> he is UTD - Return to clinic in 3-4 mo with sugar log   Time spent with the patient: 40 minutes, of which more than 50% was spent in reviewing his CGM downloads, reviewing his current medication doses, discussing his hypo-and hyper glycemic episodes, reviewing previous labs and ED visit note and developing a plan to further avoid hypo-and hyperglycemia.  Carlus Pavlovristina Ciarah Peace, MD PhD [facility]

## 2017-06-22 ENCOUNTER — Encounter: Payer: Self-pay | Admitting: Internal Medicine

## 2017-06-23 ENCOUNTER — Other Ambulatory Visit: Payer: Self-pay | Admitting: Internal Medicine

## 2017-06-23 ENCOUNTER — Encounter: Payer: Self-pay | Admitting: Internal Medicine

## 2017-06-23 ENCOUNTER — Encounter: Payer: Self-pay | Admitting: Family Medicine

## 2017-06-23 MED ORDER — ROSUVASTATIN CALCIUM 5 MG PO TABS: 5.0000 mg | ORAL_TABLET | Freq: Every day | ORAL | 3 refills | Status: DC

## 2017-06-23 MED FILL — GLUCAGON 1 MG EMERGENCY KIT: 1 | 1 days supply | Qty: 1 | Fill #0

## 2017-06-23 MED FILL — ROSUVASTATIN CALCIUM 5 MG T: 5 | 90 days supply | Qty: 90 | Fill #0

## 2017-06-27 MED FILL — FREESTYLE LIBRE SENSOR SYST: 30 days supply | Qty: 3 | Fill #7

## 2017-06-30 ENCOUNTER — Encounter (HOSPITAL_BASED_OUTPATIENT_CLINIC_OR_DEPARTMENT_OTHER): Payer: Self-pay | Admitting: *Deleted

## 2017-06-30 ENCOUNTER — Emergency Department (HOSPITAL_BASED_OUTPATIENT_CLINIC_OR_DEPARTMENT_OTHER)
Admission: EM | Admit: 2017-06-30 | Discharge: 2017-06-30 | Disposition: A | Discharge status: Home / Self Care | Payer: 59 | Attending: Emergency Medicine | Admitting: Emergency Medicine

## 2017-06-30 ENCOUNTER — Encounter: Payer: Self-pay | Admitting: Family Medicine

## 2017-06-30 ENCOUNTER — Emergency Department (HOSPITAL_BASED_OUTPATIENT_CLINIC_OR_DEPARTMENT_OTHER): Payer: 59

## 2017-06-30 ENCOUNTER — Other Ambulatory Visit: Payer: Self-pay

## 2017-06-30 DIAGNOSIS — Z79899 Other long term (current) drug therapy: Secondary | ICD-10-CM | POA: Diagnosis not present

## 2017-06-30 DIAGNOSIS — R5383 Other fatigue: Secondary | ICD-10-CM | POA: Diagnosis not present

## 2017-06-30 DIAGNOSIS — R079 Chest pain, unspecified: Secondary | ICD-10-CM

## 2017-06-30 DIAGNOSIS — E119 Type 2 diabetes mellitus without complications: Secondary | ICD-10-CM | POA: Insufficient documentation

## 2017-06-30 DIAGNOSIS — Z7984 Long term (current) use of oral hypoglycemic drugs: Secondary | ICD-10-CM | POA: Diagnosis not present

## 2017-06-30 DIAGNOSIS — R0789 Other chest pain: Secondary | ICD-10-CM | POA: Diagnosis not present

## 2017-06-30 LAB — CBC WITH DIFFERENTIAL/PLATELET
Basophils Absolute: 0 10*3/uL (ref 0.0–0.1)
Basophils Relative: 0 %
Eosinophils Absolute: 0.2 10*3/uL (ref 0.0–0.7)
Eosinophils Relative: 2 %
HCT: 41.2 % (ref 39.0–52.0)
Hemoglobin: 13.5 g/dL (ref 13.0–17.0)
Lymphocytes Relative: 35 %
Lymphs Abs: 2.6 10*3/uL (ref 0.7–4.0)
MCH: 28.6 pg (ref 26.0–34.0)
MCHC: 32.8 g/dL (ref 30.0–36.0)
MCV: 87.3 fL (ref 78.0–100.0)
Monocytes Absolute: 0.8 10*3/uL (ref 0.1–1.0)
Monocytes Relative: 11 %
Neutro Abs: 3.9 10*3/uL (ref 1.7–7.7)
Neutrophils Relative %: 52 %
Platelets: 221 10*3/uL (ref 150–400)
RBC: 4.72 MIL/uL (ref 4.22–5.81)
RDW: 12.6 % (ref 11.5–15.5)
WBC: 7.4 10*3/uL (ref 4.0–10.5)

## 2017-06-30 LAB — COMPREHENSIVE METABOLIC PANEL
ALT: 26 U/L (ref 17–63)
AST: 35 U/L (ref 15–41)
Albumin: 4.2 g/dL (ref 3.5–5.0)
Alkaline Phosphatase: 51 U/L (ref 38–126)
Anion gap: 13 (ref 5–15)
BUN: 15 mg/dL (ref 6–20)
CO2: 22 mmol/L (ref 22–32)
Calcium: 9.3 mg/dL (ref 8.9–10.3)
Chloride: 100 mmol/L — ABNORMAL LOW (ref 101–111)
Creatinine, Ser: 0.94 mg/dL (ref 0.61–1.24)
GFR calc Af Amer: >60 mL/min (ref >60)
GFR calc non Af Amer: >60 mL/min (ref >60)
Glucose, Bld: 144 mg/dL — ABNORMAL HIGH (ref 65–99)
Potassium: 3.9 mmol/L (ref 3.5–5.1)
Sodium: 135 mmol/L (ref 135–145)
Total Bilirubin: 1 mg/dL (ref 0.3–1.2)
Total Protein: 7.5 g/dL (ref 6.5–8.1)

## 2017-06-30 LAB — CK: Total CK: 93 U/L (ref 49–397)

## 2017-06-30 LAB — CBG MONITORING, ED: Glucose-Capillary: 135 mg/dL — ABNORMAL HIGH (ref 65–99)

## 2017-06-30 LAB — TROPONIN I: Troponin I: <0.03 ng/mL (ref <0.03)

## 2017-06-30 NOTE — ED Notes (Signed)
Family at bedside. 

## 2017-06-30 NOTE — ED Notes (Signed)
Patient transported to X-ray 

## 2017-06-30 NOTE — ED Notes (Signed)
ED Provider at bedside. 

## 2017-06-30 NOTE — ED Provider Notes (Signed)
[facility] EMERGENCY DEPARTMENT Provider Note   CSN: [id number] Arrival date & time: 06/30/17  2141     History   Chief Complaint Chief Complaint  Patient presents with  . Chest Pain    HPI Nathaniel [name] is a 52 y.o. male.  Patient is a 52 year old male with a history of type 2 diabetes, upper lipidemia who is presenting today with chest discomfort that has been ongoing now for 5 days straight.  It is slightly worsening he is now feeling a little bit in his back and general fatigue.  Patient notes that on Monday he started Crestor for an LDL of 74 and feels that the symptoms started after that.  He states 18 years ago he had taking Crestor but had not been on any statins recently.  He also recently changed his diabetic medications within the last month and is still working on adjusting them due to repetitive hypoglycemic events.  There is nothing that makes the chest discomfort worse.  He describes it as a pressure in the left side of his chest that is a 2 or 3.  It is not acute affected by exertion and he states he still runs 4-5 times a week and has not been more short of breath.  He denies cough or fever.  Eating does not affect the pain.  Taking deep breaths or positioning does not cause change in symptoms.  He has had no unilateral leg pain or swelling, recent travel or surgeries.  No family history of cardiac issues or clotting disorders.  He does not have any known cardiac disease and has not had stress testing or echoes in the past.  The history is provided by the patient.    Past Medical History:  Diagnosis Date  . Depression   . Diabetes mellitus    type II  . GERD (gastroesophageal reflux disease)   . Headache(784.0)   . Hypogonadism male   . Sleep apnea     Patient Active Problem List   Diagnosis Date Noted  . Obstructive sleep apnea 06/09/2016  . Type 2 diabetes mellitus with hyperglycemia, with long-term current use of insulin (HCC) 10/02/2015  .  Depression 05/15/2015  . Cervical radiculitis 01/25/2015  . BPH (benign prostatic hyperplasia) 09/12/2014  . Neck pain 02/27/2013  . Chronic headaches 09/28/2011  . Seasonal and perennial allergic rhinitis 09/28/2011  . HYPERLIPIDEMIA 12/16/2008  . FATIGUE, CHRONIC 12/16/2008    Past Surgical History:  Procedure Laterality Date  . NASAL SINUS SURGERY  04/1991  . TONSILLECTOMY  1972        Home Medications    Prior to Admission medications   Medication Sig Start Date End Date Taking? Authorizing Provider  Alcohol Swabs (ALCOHOL PREPS) PADS Use alcohol pads to clean site to be used to check blood sugar. 05/02/14   Carlus Pavlov, MD  Blood Glucose Monitoring Suppl (FREESTYLE LITE) DEVI Use to check sugar 4 times daily. 04/27/16   Carlus Pavlov, MD  Continuous Blood Gluc Receiver (FREESTYLE LIBRE READER) DEVI 1 Device by Does not apply route 3 (three) times daily. 11/22/16   Carlus Pavlov, MD  Continuous Blood Gluc Sensor (FREESTYLE LIBRE SENSOR SYSTEM) MISC 1 Device by Does not apply route every 30 (thirty) days. 11/22/16   Carlus Pavlov, MD  GLUCAGON EMERGENCY 1 MG injection INJECT 1 MG INTO THE MUSCLE ONCE AS NEEDED. 06/23/17   Carlus Pavlov, MD  glucose blood (FREESTYLE LITE) test strip Use as instructed to check sugar 6 times  daily. 06/27/16   Carlus PavlovGherghe, Cristina, MD  HUMALOG KWIKPEN 100 UNIT/ML KiwkPen INJECT 0.06-0.1 MLS (6-10 UNITS TOTAL) INTO THE SKIN 3 (THREE) TIMES DAILY. 12/19/16   Carlus PavlovGherghe, Cristina, MD  Insulin Degludec (TRESIBA FLEXTOUCH) 200 UNIT/ML SOPN Inject 28 Units into the skin daily. 06/16/17   Carlus PavlovGherghe, Cristina, MD  JANUVIA 100 MG tablet TAKE ONE TABLET DAILY BEFORE BREAKFAST. 06/13/17   Carlus PavlovGherghe, Cristina, MD  Lancets (FREESTYLE) lancets Use as instructed to check sugar 4 times daily. 04/27/16   Carlus PavlovGherghe, Cristina, MD  lisinopril (PRINIVIL,ZESTRIL) 2.5 MG tablet Take 1 tablet (2.5 mg total) by mouth daily. 02/20/17   Rodolph Bongorey, Evan S, MD  metFORMIN (GLUCOPHAGE-XR) 500  MG 24 hr tablet TAKE 4 TABLETS BY MOUTH DAILY WITH SUPPER 06/13/17   Carlus PavlovGherghe, Cristina, MD  ondansetron (ZOFRAN ODT) 4 MG disintegrating tablet Take one tab by mouth Q6hr prn nausea.  Dissolve under tongue. 03/06/17   Lattie HawBeese, Stephen A, MD  rosuvastatin (CRESTOR) 5 MG tablet Take 1 tablet (5 mg total) by mouth daily. 06/23/17   Rodolph Bongorey, Evan S, MD  triamcinolone cream (KENALOG) 0.1 % Apply 1 application topically 2 (two) times daily. 02/20/17   Rodolph Bongorey, Evan S, MD    Family History Family History  Adopted: Yes  Problem Relation Age of Onset  . Cancer Father 565       Prostate cancer  . Healthy Mother   . Diabetes Mellitus I Son     Social History Social History   Tobacco Use  . Smoking status: Never Smoker  . Smokeless tobacco: Never Used  Substance Use Topics  . Alcohol use: No  . Drug use: No     Allergies   Cymbalta [duloxetine hcl]; Influenza vaccines; Penicillins; Pioglitazone; Promethazine hcl; and Reglan [metoclopramide]   Review of Systems Review of Systems  All other systems reviewed and are negative.    Physical Exam Updated Vital Signs BP 114/78   Pulse 72   Temp 98 F (36.7 C) (Oral)   Resp (!) 24   Ht 6' (1.829 m)   Wt 85.7 kg (189 lb)   SpO2 97%   BMI 25.63 kg/m   Physical Exam  Constitutional: He is oriented to person, place, and time. He appears well-developed and well-nourished. No distress.  HENT:  Head: Normocephalic and atraumatic.  Mouth/Throat: Oropharynx is clear and moist.  Eyes: Pupils are equal, round, and reactive to light. Conjunctivae and EOM are normal.  Neck: Normal range of motion. Neck supple.  Cardiovascular: Normal rate, regular rhythm and intact distal pulses.  No murmur heard. Pulmonary/Chest: Effort normal and breath sounds normal. No respiratory distress. He has no wheezes. He has no rales.  No chest wall pain  Abdominal: Soft. He exhibits no distension. There is no tenderness. There is no rebound and no guarding.    Musculoskeletal: Normal range of motion. He exhibits no edema or tenderness.  Neurological: He is alert and oriented to person, place, and time.  Skin: Skin is warm and dry. No rash noted. No erythema.  Psychiatric: He has a normal mood and affect. His behavior is normal.  Nursing note and vitals reviewed.    ED Treatments / Results  Labs (all labs ordered are listed, but only abnormal results are displayed) Labs Reviewed  COMPREHENSIVE METABOLIC PANEL - Abnormal; Notable for the following components:      Result Value   Chloride 100 (*)    Glucose, Bld 144 (*)    All other components within normal limits  CBG MONITORING,  ED - Abnormal; Notable for the following components:   Glucose-Capillary 135 (*)    All other components within normal limits  CBC WITH DIFFERENTIAL/PLATELET  TROPONIN I  CK    EKG EKG Interpretation  Date/Time:  Friday June 30 2017 21:45:12 EDT Ventricular Rate:  82 PR Interval:    QRS Duration: 86 QT Interval:  355 QTC Calculation: 415 R Axis:   47 Text Interpretation:  Sinus rhythm No significant change since last tracing Confirmed by Gwyneth Sprout ([id number]) on 06/30/2017 9:49:57 PM   Radiology Dg Chest 2 View  Result Date: 06/30/2017 CLINICAL DATA:  Acute chest pain times several days EXAM: CHEST - 2 VIEW COMPARISON:  04/23/2010 FINDINGS: The heart size and mediastinal contours are within normal limits. Minimal bibasilar atelectasis. No pulmonary consolidation. No CHF, effusion or pneumothorax. The visualized skeletal structures are unremarkable. IMPRESSION: No active cardiopulmonary disease.  Minimal bibasilar atelectasis. Electronically Signed   By: Tollie Eth M.D.   On: 06/30/2017 22:17    Procedures Procedures (including critical care time)  Medications Ordered in ED Medications - No data to display   Initial Impression / Assessment and Plan / ED Course  I have reviewed the triage vital signs and the nursing notes.  Pertinent labs &  imaging results that were available during my care of the patient were reviewed by me and considered in my medical decision making (see chart for details).     Patient presenting today with atypical chest pain that has been ongoing now for 5 days.  No associated symptoms except for some fatigue.  Symptoms started after starting Crestor this week for the first time in 18 years.  He denies any infectious symptoms and low suspicion for PE or DVT.  Patient has no signs of fluid overload suggestive of CHF.  Low suspicion that this is ACS as patient has a normal EKG, negative troponin.  His potassium is within normal limits he has no sign of acute renal injury.  Patient's CK is within normal limits and chest x-ray without acute findings.  Low suspicion for dissection and does not seem to be GI pathology.  Concerned that symptoms may be a result of starting the Crestor.  Recommended that patient stop the Crestor to see if symptoms resolve and follow-up with his regular doctor.  He was told to return if he becomes short of breath, worsening pain, diaphoresis or symptoms become related to exertion  Final Clinical Impressions(s) / ED Diagnoses   Final diagnoses:  Nonspecific chest pain    ED Discharge Orders    None       Gwyneth Sprout, MD 06/30/17 2302

## 2017-06-30 NOTE — ED Triage Notes (Signed)
Pt c/o Mid sternal chest pain  X 6 days  Denies nausea, SOB

## 2017-07-01 ENCOUNTER — Encounter: Payer: Self-pay | Admitting: Family Medicine

## 2017-07-01 DIAGNOSIS — Z789 Other specified health status: Secondary | ICD-10-CM

## 2017-07-03 ENCOUNTER — Ambulatory Visit (INDEPENDENT_AMBULATORY_CARE_PROVIDER_SITE_OTHER): Payer: 59 | Admitting: Family Medicine

## 2017-07-03 ENCOUNTER — Encounter: Payer: Self-pay | Admitting: Family Medicine

## 2017-07-03 ENCOUNTER — Telehealth: Payer: Self-pay | Admitting: Internal Medicine

## 2017-07-03 VITALS — BP 114/70 | HR 80 | Temp 97.6°F | Ht 72.0 in | Wt 195.0 lb

## 2017-07-03 DIAGNOSIS — Z794 Long term (current) use of insulin: Secondary | ICD-10-CM | POA: Diagnosis not present

## 2017-07-03 DIAGNOSIS — R002 Palpitations: Secondary | ICD-10-CM | POA: Diagnosis not present

## 2017-07-03 DIAGNOSIS — F32A Depression, unspecified: Secondary | ICD-10-CM | POA: Insufficient documentation

## 2017-07-03 DIAGNOSIS — F329 Major depressive disorder, single episode, unspecified: Secondary | ICD-10-CM | POA: Insufficient documentation

## 2017-07-03 DIAGNOSIS — Z789 Other specified health status: Secondary | ICD-10-CM | POA: Insufficient documentation

## 2017-07-03 DIAGNOSIS — F411 Generalized anxiety disorder: Secondary | ICD-10-CM

## 2017-07-03 DIAGNOSIS — E1165 Type 2 diabetes mellitus with hyperglycemia: Secondary | ICD-10-CM

## 2017-07-03 MED ORDER — VILAZODONE HCL 10 & 20 MG PO KIT: 1.0000 | PACK | Freq: Every day | ORAL | 0 refills | Status: DC

## 2017-07-03 NOTE — Telephone Encounter (Signed)
Patient stated he was having minor chest pains Patient was started on the Tresiba a week and a half ago, and is unsure it was the medication is causing the chest pain. He had two low glucose reading in the 40's but in general he thinks the tresiba working was also started on Crestor.  I called to see how he was doing he went to the ER, the doctor in the hospital think the Crestor was causing it, but patient said he is still having the chest pains. Please advise

## 2017-07-03 NOTE — Patient Instructions (Addendum)
Thank you for coming in today. You should hear from cardiology about an appointment as well as the holter monitor.  I think it is ok to continue to exercise.  Start viibryd.  Send me a report in about 2 weeks through mychart.  We will keep in touch.     Palpitations A palpitation is the feeling that your heartbeat is irregular or is faster than normal. It may feel like your heart is fluttering or skipping a beat. Palpitations are usually not a serious problem. They may be caused by many things, including smoking, caffeine, alcohol, stress, and certain medicines. Although most causes of palpitations are not serious, palpitations can be a sign of a serious medical problem. In some cases, you may need further medical evaluation. Follow these instructions at home: Pay attention to any changes in your symptoms. Take these actions to help with your condition:  Avoid the following: ? Caffeinated coffee, tea, soft drinks, diet pills, and energy drinks. ? Chocolate. ? Alcohol.  Do not use any tobacco products, such as cigarettes, chewing tobacco, and e-cigarettes. If you need help quitting, ask your health care provider.  Try to reduce your stress and anxiety. Things that can help you relax include: ? Yoga. ? Meditation. ? Physical activity, such as swimming, jogging, or walking. ? Biofeedback. This is a method that helps you learn to use your mind to control things in your body, such as your heartbeats.  Get plenty of rest and sleep.  Take over-the-counter and prescription medicines only as told by your health care provider.  Keep all follow-up visits as told by your health care provider. This is important.  Contact a health care provider if:  You continue to have a fast or irregular heartbeat after 24 hours.  Your palpitations occur more often. Get help right away if:  You have chest pain or shortness of breath.  You have a severe headache.  You feel dizzy or you faint. This  information is not intended to replace advice given to you by your health care provider. Make sure you discuss any questions you have with your health care provider. Document Released: 03/04/2000 Document Revised: 08/10/2015 Document Reviewed: 11/20/2014 Elsevier Interactive Patient Education  2018 [employer]   Vilazodone oral tablet What is this medicine? VILAZODONE (vil [place] oh done) is used to treat depression. This medicine may be used for other purposes; ask your health care provider or pharmacist if you have questions. COMMON BRAND NAME(S): VIIBRYD What should I tell my health care provider before I take this medicine? They need to know if you have any of these conditions: -bipolar disorder or a family history of bipolar disorder -glaucoma -liver disease -low levels of sodium in the blood -receiving electroconvulsive therapy -seizures (convulsions) -suicidal thoughts, plans, or attempt by you or a family member -an unusual or allergic reaction to vilazodone, other medicines, foods, dyes or preservatives -pregnant or trying to get pregnant -breast-feeding How should I use this medicine? Take this medicine by mouth with a glass of water. Follow the directions on the prescription label. Take this medicine with food. Take your medicine at regular intervals. Do not take your medicine more often than directed. Do not stop taking this medicine suddenly except upon the advice of your doctor. Stopping this medicine too quickly may cause serious side effects or your condition may worsen. A special MedGuide will be given to you by the pharmacist with each prescription and refill. Be sure to read this information carefully each  time. Overdosage: If you think you have taken too much of this medicine contact a poison control center or emergency room at once. NOTE: This medicine is only for you. Do not share this medicine with others. What if I miss a dose? If you miss a dose, take it as soon as  you can. If it is almost time for your next dose, take only that dose. Do not take double or extra doses. What may interact with this medicine? Do not take this medicine with any of the following medications: -linezolid -MAOIs like Carbex, Eldepryl, Marplan, Nardil, and Parnate -methylene blue (injected into a vein) This medicine may also interact with the following medications: -amphetamines -aspirin and aspirin-like medicines -buspirone -certain diet drugs like dexfenfluramine, fenfluramine, phentermine, sibutramine -certain migraine headache medicines like almotriptan, eletriptan, frovatriptan, naratriptan, rizatriptan, sumatriptan, zolmitriptan -certain medicines that treat or prevent blood clots like warfarin, enoxaparin, and dalteparin -certain medicines that treat infections like clarithromycin, itraconazole, voriconazole, ketoconazole, rifampin -certain medicines that treat seizures like carbamazepine and phenytoin -digoxin -fentanyl -lithium -NSAIDS, medicines for pain and inflammation, like ibuprofen or naproxen -other medicines for depression, anxiety, or psychotic disturbances -St. John's Wort -tramadol -tryptophan This list may not describe all possible interactions. Give your health care provider a list of all the medicines, herbs, non-prescription drugs, or dietary supplements you use. Also tell them if you smoke, drink alcohol, or use illegal drugs. Some items may interact with your medicine. What should I watch for while using this medicine? Tell your doctor if your symptoms do not get better or if they get worse. Visit your doctor or health care professional for regular checks on your progress. Because it may take several weeks to see the full effects of this medicine, it is important to continue your treatment as prescribed by your doctor. Patients and their families should watch out for new or worsening thoughts of suicide or depression. Also watch out for sudden changes  in feelings such as feeling anxious, agitated, panicky, irritable, hostile, aggressive, impulsive, severely restless, overly excited and hyperactive, or not being able to sleep. If this happens, especially at the beginning of treatment or after a change in dose, call your health care professional. Bonita Quin may get drowsy or dizzy. Do not drive, use machinery, or do anything that needs mental alertness until you know how this medicine affects you. Do not stand or sit up quickly, especially if you are an older patient. This reduces the risk of dizzy or fainting spells. Alcohol may interfere with the effect of this medicine. Avoid alcoholic drinks. Your mouth may get dry. Chewing sugarless gum or sucking hard candy, and drinking plenty of water may help. Contact your doctor if the problem does not go away or is severe. What side effects may I notice from receiving this medicine? Side effects that you should report to your doctor or health care professional as soon as possible: -allergic reactions like skin rash, itching or hives, swelling of the face, lips, or tongue -anxious -black, tarry stools -changes in vision -confusion -elevated mood, decreased need for sleep, racing thoughts, impulsive behavior -eye pain -fast, irregular heartbeat -feeling faint or lightheaded, falls -feeling agitated, angry, or irritable -hallucination, loss of contact with reality -loss of balance or coordination -loss of memory -restlessness, pacing, inability to keep still -seizures -stiff muscles -suicidal thoughts or other mood changes -trouble sleeping -unusual bleeding or bruising -unusually weak or tired -vomiting Side effects that usually do not require medical attention (report to your doctor or  health care professional if they continue or are bothersome): -change in appetite or weight -change in sex drive or performance -diarrhea -drowsiness -dry mouth -increased sweating -nausea -tremors This list may  not describe all possible side effects. Call your doctor for medical advice about side effects. You may report side effects to FDA at 1-800-FDA-1088. Where should I keep my medicine? Keep out of the reach of children. Store at room temperature between 15 and 30 degrees C (59 to 86 degrees F). Throw away any unused medicine after the expiration date. NOTE: This sheet is a summary. It may not cover all possible information. If you have questions about this medicine, talk to your doctor, pharmacist, or health care provider.  2018 Elsevier/Gold Standard (2015-11-03 12:50:48)

## 2017-07-03 NOTE — Telephone Encounter (Signed)
Patient c

## 2017-07-03 NOTE — Progress Notes (Signed)
Nathaniel [name] is a 52 y.o. male who presents to [facility] today for palpitations.  Nathaniel [name] developed palpitations a few days ago.  He was seen in the emergency department on April 12 where workup was unremarkable.  He had normal EKG and negative troponins.  His blood sugar was not in the hypoglycemic range.  He has a sensation that he can feel his heart beating forcefully in his chest.  He notes a pertinent history for depression and wonders if he might be dealing with some anxiety.  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  In the past he has had depression that was managed with Lexapro which caused erectile dysfunction and discontinued it.  He said trouble with Zoloft Wellbutrin before.  He notes Cymbalta caused suicidal ideation.  Medical history is also significant for diabetes.  He has difficult to control diabetes with changing blood sugars.  He has he has had a few episodes of hypoglycemic episodes with blood sugar down into the 50s.  He is using a Freestyle libre continuous glucose monitor and notes that his symptoms do not correlate with his episodes of hypoglycemia.  He notes that he was recently started on Crestor for augmentation of his lipids with diabetes.  If the Crestor somewhat coincided with the rotations and he discontinued it and notes that he has not felt much different.   Past Medical History:  Diagnosis Date  . Depression   . Diabetes mellitus    type II  . GERD (gastroesophageal reflux disease)   . Headache(784.0)   . Hypogonadism male   . Sleep apnea    Past Surgical History:  Procedure Laterality Date  . NASAL SINUS SURGERY  04/1991  . TONSILLECTOMY  1972   Social History   Tobacco Use  . Smoking status: Never Smoker  . Smokeless tobacco: Never Used  Substance Use Topics  . Alcohol use: No   family history  includes Cancer (age of onset: 89) in his father; Diabetes Mellitus I in his son; Healthy in his mother. He was adopted.  ROS as above:  Medications: Current Outpatient Medications  Medication Sig Dispense Refill  . Alcohol Swabs (ALCOHOL PREPS) PADS Use alcohol pads to clean site to be used to check blood sugar. 100 each 11  . Blood Glucose Monitoring Suppl (FREESTYLE LITE) DEVI Use to check sugar 4 times daily. 1 each 0  . Continuous Blood Gluc Receiver (FREESTYLE LIBRE READER) DEVI 1 Device by Does not apply route 3 (three) times daily. 1 Device 1  . Continuous Blood Gluc Sensor (FREESTYLE LIBRE SENSOR SYSTEM) MISC 1 Device by Does not apply route every 30 (thirty) days. 3 each 11  . GLUCAGON EMERGENCY 1 MG injection INJECT 1 MG INTO THE MUSCLE ONCE AS NEEDED. 1 kit 3  . glucose blood (FREESTYLE LITE) test strip Use as instructed to check sugar 6 times daily. 600 each 5  . HUMALOG KWIKPEN 100 UNIT/ML KiwkPen INJECT 0.06-0.1 MLS (6-10 UNITS TOTAL) INTO THE SKIN 3 (THREE) TIMES DAILY. 15 mL 5  . Insulin Degludec (TRESIBA FLEXTOUCH) 200 UNIT/ML SOPN Inject 28 Units into the skin daily. 3 pen 11  . JANUVIA 100 MG tablet TAKE ONE TABLET DAILY BEFORE BREAKFAST. 90 tablet 1  . Lancets (FREESTYLE) lancets Use as instructed to check sugar 4 times daily. 400 each 5  . lisinopril (PRINIVIL,ZESTRIL) 2.5 MG tablet Take 1 tablet (2.5 mg total) by  mouth daily. 90 tablet 3  . metFORMIN (GLUCOPHAGE-XR) 500 MG 24 hr tablet TAKE 4 TABLETS BY MOUTH DAILY WITH SUPPER 360 tablet 3  . ondansetron (ZOFRAN ODT) 4 MG disintegrating tablet Take one tab by mouth Q6hr prn nausea.  Dissolve under tongue. 12 tablet 0  . rosuvastatin (CRESTOR) 5 MG tablet Take 1 tablet (5 mg total) by mouth daily. 90 tablet 3  . triamcinolone cream (KENALOG) 0.1 % Apply 1 application topically 2 (two) times daily. 453.6 g 12  . Vilazodone HCl (VIIBRYD STARTER PACK) 10 & 20 MG KIT Take 1 tablet by mouth daily. Use as directed, 4 weeks QS 1  kit 0   No current facility-administered medications for this visit.    Allergies  Allergen Reactions  . Cymbalta [Duloxetine Hcl] Other (See Comments)    Suicidal thoughts  . Influenza Vaccines Other (See Comments)    Unknown reaction  . Penicillins Other (See Comments)    Unknown - childhood allergy per mother  . Pioglitazone Other (See Comments)      headache  . Promethazine Hcl Nausea And Vomiting       . Reglan [Metoclopramide] Other (See Comments)    Jittery, increased anxiety    Health Maintenance Health Maintenance  Topic Date Due  . HIV Screening  09/02/1980  . OPHTHALMOLOGY EXAM  06/28/2017  . INFLUENZA VACCINE  10/23/2017 (Originally 10/19/2017)  . HEMOGLOBIN A1C  12/17/2017  . FOOT EXAM  06/02/2018  . Fecal DNA (Cologuard)  02/29/2020  . TETANUS/TDAP  03/01/2021  . PNEUMOCOCCAL POLYSACCHARIDE VACCINE (2) 02/20/2022     Exam:  BP 114/70   Pulse 80   Temp 97.6 F (36.4 C) (Oral)   Ht 6' (1.829 m)   Wt 195 lb (88.5 kg)   BMI 26.45 kg/m  Gen: Well NAD HEENT: EOMI,  MMM Lungs: Normal work of breathing. CTABL Heart: RRR no MRG Abd: NABS, Soft. Nondistended, Nontender Exts: Brisk capillary refill, warm and well perfused.  Psych alert and oriented.  Affect somewhat flat (but normal for patient) no SI or HI.  Normal speech thought process. Depression screen [facility] 2/9 07/03/2017 12/16/2016 01/11/2016 09/08/2015 03/20/2015  Decreased Interest 1 0 0 1 0  Down, Depressed, Hopeless 2 0 0 0 0  PHQ - 2 Score 3 0 0 1 0  Altered sleeping 0 - - - -  Tired, decreased energy 3 - - - -  Change in appetite 3 - - - -  Feeling bad or failure about yourself  2 - - - -  Trouble concentrating 0 - - - -  Moving slowly or fidgety/restless 0 - - - -  Suicidal thoughts 0 - - - -  PHQ-9 Score 11 - - - -  Difficult doing work/chores Somewhat difficult - - - -  Some recent data might be hidden   GAD 7 : Generalized Anxiety Score 07/03/2017  Nervous, Anxious, on Edge 2    Control/stop worrying 1  Worry too much - different things 2  Trouble relaxing 1  Restless 0  Easily annoyed or irritable 2  Afraid - awful might happen 2  Total GAD 7 Score 10  Anxiety Difficulty Somewhat difficult      Results for orders placed or performed during the hospital encounter of 06/30/17 (from the past 72 hour(s))  POC CBG, ED     Status: Abnormal   Collection Time: 06/30/17  9:52 PM  Result Value Ref Range   Glucose-Capillary 135 (H) 65 - 99 mg/dL  CBC with Differential     Status: None   Collection Time: 06/30/17  9:53 PM  Result Value Ref Range   WBC 7.4 4.0 - 10.5 K/uL   RBC 4.72 4.22 - 5.81 MIL/uL   Hemoglobin 13.5 13.0 - 17.0 g/dL   HCT 41.2 39.0 - 52.0 %   MCV 87.3 78.0 - 100.0 fL   MCH 28.6 26.0 - 34.0 pg   MCHC 32.8 30.0 - 36.0 g/dL   RDW 12.6 11.5 - 15.5 %   Platelets 221 150 - 400 K/uL   Neutrophils Relative % 52 %   Neutro Abs 3.9 1.7 - 7.7 K/uL   Lymphocytes Relative 35 %   Lymphs Abs 2.6 0.7 - 4.0 K/uL   Monocytes Relative 11 %   Monocytes Absolute 0.8 0.1 - 1.0 K/uL   Eosinophils Relative 2 %   Eosinophils Absolute 0.2 0.0 - 0.7 K/uL   Basophils Relative 0 %   Basophils Absolute 0.0 0.0 - 0.1 K/uL    Comment: Performed at [facility], [address] [place], [place] [address]  Comprehensive metabolic panel     Status: Abnormal   Collection Time: 06/30/17  9:53 PM  Result Value Ref Range   Sodium 135 135 - 145 mmol/L   Potassium 3.9 3.5 - 5.1 mmol/L   Chloride 100 (L) 101 - 111 mmol/L   CO2 22 22 - 32 mmol/L   Glucose, Bld 144 (H) 65 - 99 mg/dL   BUN 15 6 - 20 mg/dL   Creatinine, Ser 0.94 0.61 - 1.24 mg/dL   Calcium 9.3 8.9 - 10.3 mg/dL   Total Protein 7.5 6.5 - 8.1 g/dL   Albumin 4.2 3.5 - 5.0 g/dL   AST 35 15 - 41 U/L   ALT 26 17 - 63 U/L   Alkaline Phosphatase 51 38 - 126 U/L   Total Bilirubin 1.0 0.3 - 1.2 mg/dL   GFR calc non Af Amer >60 >60 mL/min   GFR calc Af Amer >60 >60 mL/min    Comment: (NOTE) The eGFR  has been calculated using the CKD EPI equation. This calculation has not been validated in all clinical situations. eGFR's persistently <60 mL/min signify possible Chronic Kidney Disease.    Anion gap 13 5 - 15    Comment: Performed at [facility], [address] [place], [place] [address]  Troponin I     Status: None   Collection Time: 06/30/17  9:53 PM  Result Value Ref Range   Troponin I <0.03 <0.03 ng/mL    Comment: Performed at [facility], [address] [place], [place] [address]  CK     Status: None   Collection Time: 06/30/17  9:53 PM  Result Value Ref Range   Total CK 93 49 - 397 U/L    Comment: Performed at [facility], [address] [place], [place] [address]   Dg Chest 2 View  Result Date: 06/30/2017 CLINICAL DATA:  Acute chest pain times several days EXAM: CHEST - 2 VIEW COMPARISON:  04/23/2010 FINDINGS: The heart size and mediastinal contours are within normal limits. Minimal bibasilar atelectasis. No pulmonary consolidation. No CHF, effusion or pneumothorax. The visualized skeletal structures are unremarkable. IMPRESSION: No active cardiopulmonary disease.  Minimal bibasilar atelectasis. Electronically Signed   By: Ashley Royalty M.D.   On: 06/30/2017 22:17   Twelve-lead EKG reviewed.   Assessment and Plan: 52 y.o. male with  Palpitations:  Unclear etiology.  Plan to obtain Holter monitor and refer to cardiology for likely stress testing.  Patient is at relatively higher risk due to his diabetes although last lipids were excellent with an LDL of 74.  I think is reasonable to hold off on restarting Crestor at this time .  Regardless of palpitations [facility] does note worsening anxiety that when asked thinks he should probably start treating it.  Plan to use Viibryd starter pack with plan for 40 mg daily.  This should help avoid sexual side effects and is not SSRI or SNRI class which she has had trouble with in the past.  Recheck in a 6  weeks.  My chart check back in 2 weeks.   Orders Placed This Encounter  Procedures  . Ambulatory referral to Cardiology    Referral Priority:   Routine    Referral Type:   Consultation    Referral Reason:   Specialty Services Required    Requested Specialty:   Cardiology    Number of Visits Requested:   1  . Holter monitor - 48 hour    Standing Status:   Future    Standing Expiration Date:   07/04/2027    Order Specific Question:   Where should this test be performed?    Answer:   [facility]   Meds ordered this encounter  Medications  . Vilazodone HCl (VIIBRYD STARTER PACK) 10 & 20 MG KIT    Sig: Take 1 tablet by mouth daily. Use as directed, 4 weeks QS    Dispense:  1 kit    Refill:  0    Pt triad in failed or had side effects with Lexapro, zoloft, wellbutrin and Clymbalta     Discussed warning signs or symptoms. Please see discharge instructions. Patient expresses understanding.

## 2017-07-04 MED FILL — VIIBRYD 10-20 MG STARTER PA: 10 & 20 | 30 days supply | Qty: 30 | Fill #0

## 2017-07-04 NOTE — Telephone Encounter (Signed)
I reviewed his chart.  He is being referred to cardiology, to which I fully agree with.   I doubt that this is a side effect of [place]iba.  However, if he continues to have low blood sugars, he needs to back off the dose by a few units.

## 2017-07-05 NOTE — Telephone Encounter (Signed)
Spoke to patient. Went over Dr. Charlean SanfilippoGherghe's previous note. Pt verbalized understanding. Pt said he has been taking 26 units of Tresiba for the past 4 days and has not had any lows.

## 2017-07-09 ENCOUNTER — Encounter: Payer: Self-pay | Admitting: Family Medicine

## 2017-07-13 ENCOUNTER — Encounter: Payer: Self-pay | Admitting: Cardiology

## 2017-07-13 ENCOUNTER — Ambulatory Visit: Payer: 59 | Admitting: Cardiology

## 2017-07-13 VITALS — BP 122/72 | HR 70 | Ht 73.0 in | Wt 193.0 lb

## 2017-07-13 DIAGNOSIS — Z794 Long term (current) use of insulin: Secondary | ICD-10-CM | POA: Diagnosis not present

## 2017-07-13 DIAGNOSIS — G4733 Obstructive sleep apnea (adult) (pediatric): Secondary | ICD-10-CM | POA: Diagnosis not present

## 2017-07-13 DIAGNOSIS — E1165 Type 2 diabetes mellitus with hyperglycemia: Secondary | ICD-10-CM | POA: Diagnosis not present

## 2017-07-13 DIAGNOSIS — R079 Chest pain, unspecified: Secondary | ICD-10-CM | POA: Diagnosis not present

## 2017-07-13 MED ORDER — ASPIRIN EC 81 MG PO TBEC: 162.0000 mg | DELAYED_RELEASE_TABLET | Freq: Every day | ORAL | 3 refills | Status: DC

## 2017-07-13 NOTE — Progress Notes (Signed)
Cardiology Office Note:    Date:  07/13/2017   ID:  Nathaniel [name], DOB 08-06-1965, MRN [id number]  PCP:  Gregor Hams, MD  Cardiologist:  Jenean Lindau, MD   Referring MD: Gregor Hams, MD    ASSESSMENT:    1. Chest pain, unspecified type   2. Obstructive sleep apnea   3. Type 2 diabetes mellitus with hyperglycemia, with long-term current use of insulin (HCC)    PLAN:    In order of problems listed above:  1. Primary prevention stressed with the patient.  Importance of compliance with diet and medications for this 20 vocalized understanding.  Patient is stable.  Diet was discussed for diabetes mellitus.  Also I told him to take to coated aspirins on a daily basis 162 mg daily total dose.  This is especially because he is a diabetic.  He needs to be on statin therapy.  His story is not typical for statin intolerance.  I suggested that he talk to his primary care physician about trying different statins to see if they will work for him. 2. His chest pain symptoms are atypical however in view of risk factors we will do an exercise treadmill stress test.  If this is negative he will be seen in follow-up on a as needed basis only.   Medication Adjustments/Labs and Tests Ordered: Current medicines are reviewed at length with the patient today.  Concerns regarding medicines are outlined above.  Orders Placed This Encounter  Procedures  . EXERCISE TOLERANCE TEST (ETT)   Meds ordered this encounter  Medications  . aspirin EC 81 MG tablet    Sig: Take 2 tablets (162 mg total) by mouth daily.    Dispense:  90 tablet    Refill:  3     History of Present Illness:    Nathaniel [name] is a 52 y.o. male who is being seen today for the evaluation of chest pain at the request of Gregor Hams, MD.  Patient is a pleasant 52 year old male.  He has past medical history of diabetes mellitus.  He is an active gentleman.  He tells me that he walks 30 to 40 minutes on a daily basis without any  problems.  No chest pain orthopnea or PND.  He mentions to me that occasionally chest tightness not related to exertion.  No radiation to the neck or to the office.  He went to the emergency room and was evaluated.  EKG was unremarkable and was released.  At the time of my evaluation, the patient is alert awake oriented and in no distress.  Past Medical History:  Diagnosis Date  . Depression   . Diabetes mellitus    type II  . GERD (gastroesophageal reflux disease)   . Headache(784.0)   . Hypogonadism male   . Sleep apnea     Past Surgical History:  Procedure Laterality Date  . NASAL SINUS SURGERY  04/1991  . TONSILLECTOMY  1972    Current Medications: Current Meds  Medication Sig  . Alcohol Swabs (ALCOHOL PREPS) PADS Use alcohol pads to clean site to be used to check blood sugar.  . Blood Glucose Monitoring Suppl (FREESTYLE LITE) DEVI Use to check sugar 4 times daily.  . Continuous Blood Gluc Receiver (FREESTYLE LIBRE READER) DEVI 1 Device by Does not apply route 3 (three) times daily.  . Continuous Blood Gluc Sensor (FREESTYLE LIBRE SENSOR SYSTEM) MISC 1 Device by Does not apply route every 30 (thirty) days.  Marland Kitchen  GLUCAGON EMERGENCY 1 MG injection INJECT 1 MG INTO THE MUSCLE ONCE AS NEEDED.  Marland Kitchen glucose blood (FREESTYLE LITE) test strip Use as instructed to check sugar 6 times daily.  Marland Kitchen HUMALOG KWIKPEN 100 UNIT/ML KiwkPen INJECT 0.06-0.1 MLS (6-10 UNITS TOTAL) INTO THE SKIN 3 (THREE) TIMES DAILY.  Marland Kitchen Insulin Degludec (TRESIBA FLEXTOUCH) 200 UNIT/ML SOPN Inject 28 Units into the skin daily.  Marland Kitchen JANUVIA 100 MG tablet TAKE ONE TABLET DAILY BEFORE BREAKFAST.  Marland Kitchen Lancets (FREESTYLE) lancets Use as instructed to check sugar 4 times daily.  Marland Kitchen lisinopril (PRINIVIL,ZESTRIL) 2.5 MG tablet Take 1 tablet (2.5 mg total) by mouth daily.  . metFORMIN (GLUCOPHAGE-XR) 500 MG 24 hr tablet TAKE 4 TABLETS BY MOUTH DAILY WITH SUPPER  . ondansetron (ZOFRAN ODT) 4 MG disintegrating tablet Take one tab by mouth  Q6hr prn nausea.  Dissolve under tongue.  . triamcinolone cream (KENALOG) 0.1 % Apply 1 application topically 2 (two) times daily.  . Vilazodone HCl (VIIBRYD STARTER PACK) 10 & 20 MG KIT Take 1 tablet by mouth daily. Use as directed, 4 weeks QS     Allergies:   Cymbalta [duloxetine hcl]; Influenza vaccines; Penicillins; Pioglitazone; Promethazine hcl; and Reglan [metoclopramide]   Social History   Socioeconomic History  . Marital status: Married    Spouse name: [name]  . Number of children: 2  . Years of education: Not on file  . Highest education level: Not on file  Occupational History    Employer: [employer]    Comment: Mental Health Case Management  Social Needs  . Financial resource strain: Not on file  . Food insecurity:    Worry: Not on file    Inability: Not on file  . Transportation needs:    Medical: Not on file    Non-medical: Not on file  Tobacco Use  . Smoking status: Never Smoker  . Smokeless tobacco: Never Used  Substance and Sexual Activity  . Alcohol use: No  . Drug use: No  . Sexual activity: Not on file  Lifestyle  . Physical activity:    Days per week: Not on file    Minutes per session: Not on file  . Stress: Not on file  Relationships  . Social connections:    Talks on phone: Not on file    Gets together: Not on file    Attends religious service: Not on file    Active member of club or organization: Not on file    Attends meetings of clubs or organizations: Not on file    Relationship status: Not on file  Other Topics Concern  . Not on file  Social History Narrative   He lives with wife and two children.   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   Highest level of education: master degree     Family History: The patient's family history includes Cancer (age of onset: 57) in his father; Diabetes Mellitus I in his son; Healthy in his mother. He was adopted.  ROS:   Please see the history of present  illness.    All other systems reviewed and are negative.  EKGs/Labs/Other Studies Reviewed:    The following studies were reviewed today: EKG reveals sinus rhythm and nonspecific ST-T changes   Recent Labs: 11/18/2016: TSH 1.56 06/30/2017: ALT 26; BUN 15; Creatinine, Ser 0.94; Hemoglobin 13.5; Platelets 221; Potassium 3.9; Sodium 135  Recent Lipid Panel    Component Value Date/Time   CHOL 155 11/18/2016 1401  TRIG 125.0 11/18/2016 1401   HDL 56.20 11/18/2016 1401   CHOLHDL 3 11/18/2016 1401   VLDL 25.0 11/18/2016 1401   LDLCALC 74 11/18/2016 1401    Physical Exam:    VS:  BP 122/72 (BP Location: Left Arm, Patient Position: Sitting, Cuff Size: Normal)   Pulse 70   Ht _0  (1.854 m)   Wt 193 lb (87.5 kg)   SpO2 98%   BMI 25.46 kg/m     Wt Readings from Last 3 Encounters:  07/13/17 193 lb (87.5 kg)  07/03/17 195 lb (88.5 kg)  06/30/17 189 lb (85.7 kg)     GEN: Patient is in no acute distress HEENT: Normal NECK: No JVD; No carotid bruits LYMPHATICS: No lymphadenopathy CARDIAC: S1 S2 regular, 2/6 systolic murmur at the apex. RESPIRATORY:  Clear to auscultation without rales, wheezing or rhonchi  ABDOMEN: Soft, non-tender, non-distended MUSCULOSKELETAL:  No edema; No deformity  SKIN: Warm and dry NEUROLOGIC:  Alert and oriented x 3 PSYCHIATRIC:  Normal affect    Signed, Jenean Lindau, MD  07/13/2017 9:27 AM    [place]

## 2017-07-13 NOTE — Patient Instructions (Addendum)
Medication Instructions:  Your physician has recommended you make the following change in your medication:  START 162 mg of enteric coated aspirin daily  Labwork: None  Testing/Procedures: Your physician has requested that you have an exercise tolerance test. For further information please visit [email or web address]. Please also follow instruction sheet, as given.  Follow-Up: Your physician recommends that you schedule a follow-up appointment in: As needed  Any Other Special Instructions Will Be Listed Below (If Applicable).     If you need a refill on your cardiac medications before your next appointment, please call your pharmacy.   [facility]  Garey HamAshley A, RN, BSN

## 2017-07-18 ENCOUNTER — Encounter: Payer: Self-pay | Admitting: Family Medicine

## 2017-07-18 DIAGNOSIS — E119 Type 2 diabetes mellitus without complications: Secondary | ICD-10-CM | POA: Diagnosis not present

## 2017-07-18 DIAGNOSIS — H40013 Open angle with borderline findings, low risk, bilateral: Secondary | ICD-10-CM | POA: Diagnosis not present

## 2017-07-18 LAB — HM DIABETES EYE EXAM

## 2017-07-20 ENCOUNTER — Telehealth: Payer: Self-pay | Admitting: Cardiology

## 2017-07-20 NOTE — Telephone Encounter (Signed)
States his test at [facility] for today was cancelled without his knowledge so he wants it done somewhere else

## 2017-07-21 NOTE — Telephone Encounter (Signed)
Patient stated that he rescheduled for [facility] one week from now. Requested that the patein call the office with any further questions or concerns.

## 2017-07-25 MED FILL — HUMALOG 100 UNITS/ML KWIKPE: 100 | 50 days supply | Qty: 15 | Fill #2

## 2017-07-27 MED FILL — FREESTYLE LIBRE SENSOR SYST: 30 days supply | Qty: 3 | Fill #8

## 2017-07-31 ENCOUNTER — Ambulatory Visit (INDEPENDENT_AMBULATORY_CARE_PROVIDER_SITE_OTHER): Payer: 59

## 2017-07-31 ENCOUNTER — Other Ambulatory Visit: Payer: Self-pay | Admitting: Family Medicine

## 2017-07-31 DIAGNOSIS — R079 Chest pain, unspecified: Secondary | ICD-10-CM

## 2017-07-31 LAB — EXERCISE TOLERANCE TEST
Estimated workload: 15.3 METS
Exercise duration (min): 13 min
Exercise duration (sec): 1 s
MPHR: 169 {beats}/min
Peak HR: 164 {beats}/min
Percent HR: 97 %
RPE: 17
Rest HR: 70 {beats}/min

## 2017-08-01 DIAGNOSIS — G4733 Obstructive sleep apnea (adult) (pediatric): Secondary | ICD-10-CM | POA: Diagnosis not present

## 2017-08-04 MED ORDER — VILAZODONE HCL 40 MG PO TABS: 40.0000 mg | ORAL_TABLET | Freq: Every day | ORAL | 0 refills | Status: DC

## 2017-08-04 MED FILL — VIIBRYD 40 MG TABLET: 40 | 90 days supply | Qty: 90 | Fill #0

## 2017-08-04 NOTE — Telephone Encounter (Signed)
Medication refilled

## 2017-08-04 NOTE — Telephone Encounter (Signed)
Completed starter pack for Viibryd and requesting RF.  Please update med list to new dose and directions patient is to start on.   Please let me know if I can do anything to help.  Thanks!

## 2017-08-06 ENCOUNTER — Encounter: Payer: Self-pay | Admitting: Internal Medicine

## 2017-08-07 ENCOUNTER — Other Ambulatory Visit: Payer: Self-pay | Admitting: Internal Medicine

## 2017-08-07 DIAGNOSIS — E1165 Type 2 diabetes mellitus with hyperglycemia: Secondary | ICD-10-CM

## 2017-08-07 DIAGNOSIS — Z794 Long term (current) use of insulin: Principal | ICD-10-CM

## 2017-08-08 ENCOUNTER — Telehealth: Payer: Self-pay | Admitting: Nutrition

## 2017-08-08 NOTE — Telephone Encounter (Signed)
Phone Call to set up an appt. To start on a pump.  Pt. Is certain he wants a Medtronic pump.  We discussed what is required to be on this pump, and he is willing to do this.  I gave him the number to call to start the process.  He was told to call me when his pump comes in for training on this, and he agreed to do this.

## 2017-08-11 DIAGNOSIS — E119 Type 2 diabetes mellitus without complications: Secondary | ICD-10-CM | POA: Diagnosis not present

## 2017-08-11 DIAGNOSIS — E1165 Type 2 diabetes mellitus with hyperglycemia: Secondary | ICD-10-CM | POA: Diagnosis not present

## 2017-08-15 ENCOUNTER — Other Ambulatory Visit: Payer: Self-pay | Admitting: *Deleted

## 2017-08-15 ENCOUNTER — Encounter: Payer: Self-pay | Admitting: Internal Medicine

## 2017-08-15 ENCOUNTER — Encounter: Payer: Self-pay | Admitting: Family Medicine

## 2017-08-15 ENCOUNTER — Ambulatory Visit: Payer: 59 | Admitting: Family Medicine

## 2017-08-15 VITALS — BP 128/80 | HR 68 | Ht 72.0 in | Wt 195.0 lb

## 2017-08-15 DIAGNOSIS — Z794 Long term (current) use of insulin: Secondary | ICD-10-CM | POA: Diagnosis not present

## 2017-08-15 DIAGNOSIS — E1165 Type 2 diabetes mellitus with hyperglycemia: Secondary | ICD-10-CM

## 2017-08-15 DIAGNOSIS — F411 Generalized anxiety disorder: Secondary | ICD-10-CM

## 2017-08-15 MED ORDER — INSULIN LISPRO 100 UNIT/ML ~~LOC~~ SOLN: 55.0000 [IU] | Freq: Every day | SUBCUTANEOUS | 11 refills | Status: DC

## 2017-08-15 MED ORDER — GLUCOSE BLOOD VI STRP: ORAL_STRIP | 12 refills | Status: DC

## 2017-08-15 MED FILL — HumaLOG 100 UNIT/ML SOLN: 100 | 89 days supply | Qty: 50 | Fill #0

## 2017-08-15 MED FILL — CONTOUR NEXT STRIPS: 50 days supply | Qty: 200 | Fill #0

## 2017-08-15 NOTE — Progress Notes (Signed)
Nathaniel [name] is a 52 y.o. male who presents to [facility] today for follow up.   Palpitations: Patient was seen last month for palpitations which he reported were occurring every day. Today he is reporting that the symptoms have improved and he now rarely feels his heart beating. Since his last visit, he was evaluated and deemed healthy by cardiology.   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 He has been taking Viibryd with no reported symptoms, but he is unsure how helpful the drug has been. He has been on an increased dose for 1 week. He denies chest pain and shortness of breath.   Diabetes: Patient's diabetes is managed by his endocrinologist. He was recently switched to [place], but he has been unhappy with his blood sugar control since then. He is still exercising, but he has a lot of trouble controlling his blood sugar. He believes this is making him feel more tired than usual.    ROS as above:  Exam:  BP 128/80   Pulse 68   Ht 6' (1.829 m)   Wt 195 lb (88.5 kg)   BMI 26.45 kg/m  Gen: Well NAD HEENT: EOMI,  MMM Lungs: Normal work of breathing. CTABL Heart: RRR no MRG Abd: NABS, Soft. Nondistended, Nontender Exts: Brisk capillary refill, warm and well perfused.   Lab and Radiology Results No results found for this or any previous visit (from the past 72 hour(s)). No results found.   Assessment and Plan: 52 y.o. male with   Palpitations/Anxiety: After cardiac evaluation, it is clear that the palpitations were most likely psychiatric. Since starting Viibryd and having improvements in life stressors, Nathaniel [name]' palpitations and anxiety have improved. His GAD7 score has decreased to 4.  It is unclear whether the Viibryd or stress changes have improved these symptoms. We will plan to continue on Viibryd for 6 months and then reassess at the next visit. If the patient wishes to stop sooner, he should contact me and we will discuss how to properly taper.   Diabetes: Patient's diabetes is managed by endocrinology. He stated that he is attempting to get his insurance to approve a Medtronics pump again since the new medication is not working. He should continue exercising.    No orders of the defined types were placed in this encounter.  No orders of the defined types were placed in this encounter.    Historical information moved to improve visibility of documentation.  Past Medical History:  Diagnosis Date  . Depression   . Diabetes mellitus    type II  . GERD (gastroesophageal reflux disease)   . Headache(784.0)   . Hypogonadism male   . Sleep apnea    Past Surgical History:  Procedure Laterality Date  . NASAL SINUS SURGERY  04/1991  . TONSILLECTOMY  1972   Social History   Tobacco Use  . Smoking status: Never Smoker  . Smokeless tobacco: Never Used  Substance Use Topics  . Alcohol use: No   family history includes Cancer (age of onset: 85) in his father; Diabetes Mellitus I in his son; Healthy in his mother. He was adopted.  Medications: Current Outpatient Medications  Medication Sig Dispense Refill  . Alcohol Swabs (ALCOHOL PREPS) PADS  Use alcohol pads to clean site to be used to check blood sugar. 100 each 11  . aspirin EC 81 MG tablet Take 2 tablets (162 mg total) by mouth daily. 90 tablet 3  . Blood Glucose Monitoring Suppl (FREESTYLE LITE) DEVI Use to check sugar 4 times daily. 1 each 0  . Continuous Blood Gluc Receiver (FREESTYLE LIBRE READER) DEVI 1 Device by Does not apply route 3 (three) times daily. 1 Device 1  . Continuous Blood Gluc Sensor (FREESTYLE LIBRE SENSOR SYSTEM) MISC 1 Device by Does not apply route every 30 (thirty) days. 3 each 11  .  GLUCAGON EMERGENCY 1 MG injection INJECT 1 MG INTO THE MUSCLE ONCE AS NEEDED. 1 kit 3  . glucose blood (FREESTYLE LITE) test strip Use as instructed to check sugar 6 times daily. 600 each 5  . HUMALOG KWIKPEN 100 UNIT/ML KiwkPen INJECT 0.06-0.1 MLS (6-10 UNITS TOTAL) INTO THE SKIN 3 (THREE) TIMES DAILY. 15 mL 5  . Insulin Degludec (TRESIBA FLEXTOUCH) 200 UNIT/ML SOPN Inject 28 Units into the skin daily. 3 pen 11  . JANUVIA 100 MG tablet TAKE ONE TABLET DAILY BEFORE BREAKFAST. 90 tablet 1  . Lancets (FREESTYLE) lancets Use as instructed to check sugar 4 times daily. 400 each 5  . lisinopril (PRINIVIL,ZESTRIL) 2.5 MG tablet Take 1 tablet (2.5 mg total) by mouth daily. 90 tablet 3  . metFORMIN (GLUCOPHAGE-XR) 500 MG 24 hr tablet TAKE 4 TABLETS BY MOUTH DAILY WITH SUPPER (Patient taking differently: TAKE 4 TABLETS BY MOUTH DAILY in the morning) 360 tablet 3  . ondansetron (ZOFRAN ODT) 4 MG disintegrating tablet Take one tab by mouth Q6hr prn nausea.  Dissolve under tongue. 12 tablet 0  . rosuvastatin (CRESTOR) 5 MG tablet Take 5 mg by mouth daily.    . Vilazodone HCl (VIIBRYD) 40 MG TABS Take 1 tablet (40 mg total) by mouth daily. 90 tablet 0   No current facility-administered medications for this visit.    Allergies  Allergen Reactions  . Cymbalta [Duloxetine Hcl] Other (See Comments)    Suicidal thoughts  . Influenza Vaccines Other (See Comments)    Unknown reaction  . Penicillins Other (See Comments)    Unknown - childhood allergy per mother  . Pioglitazone Other (See Comments)      headache  . Promethazine Hcl Nausea And Vomiting       . Reglan [Metoclopramide] Other (See Comments)    Jittery, increased anxiety    Health Maintenance Health Maintenance  Topic Date Due  . HIV Screening  09/02/1980  . INFLUENZA VACCINE  10/23/2017 (Originally 10/19/2017)  . HEMOGLOBIN A1C  12/17/2017  . FOOT EXAM  06/02/2018  . OPHTHALMOLOGY EXAM  07/19/2018  . Fecal DNA (Cologuard)  02/29/2020  .  TETANUS/TDAP  03/01/2021  . PNEUMOCOCCAL POLYSACCHARIDE VACCINE (2) 02/20/2022    Discussed warning signs or symptoms. Please see discharge instructions. Patient expresses understanding.

## 2017-08-15 NOTE — Telephone Encounter (Signed)
From: Nathaniel [name]  Sent: 08/15/2017 10:00 AM  To: [facility] Clinical Pool  Subject: Non-Urgent Medical Question             ----- Message from Mychart, Generic sent at 08/15/2017 10:00 AM EDT -----    Since I am going back on the pump I will need you to send in a prescription for the Contour Nextlink test strips for the glucometer I will be using with the pump. Also, I will need a bottle of the Humalog insulin for the pump as well. I should get my supplies for my pump tomorrow and then I will schedule a session with Bonita Quin to get started on the pump.    Thanks

## 2017-08-15 NOTE — Patient Instructions (Signed)
Thank you for coming in today. Continue current management and treatment.  Recheck with me in 6 months or sooner if needed.  Make sure the endocrinology send updates to me.

## 2017-08-16 ENCOUNTER — Encounter: Payer: Self-pay | Admitting: Internal Medicine

## 2017-08-16 ENCOUNTER — Telehealth: Payer: Self-pay | Admitting: Internal Medicine

## 2017-08-16 ENCOUNTER — Other Ambulatory Visit: Payer: Self-pay | Admitting: Internal Medicine

## 2017-08-16 DIAGNOSIS — Z794 Long term (current) use of insulin: Principal | ICD-10-CM

## 2017-08-16 DIAGNOSIS — E1165 Type 2 diabetes mellitus with hyperglycemia: Secondary | ICD-10-CM

## 2017-08-16 NOTE — Telephone Encounter (Signed)
Patient stated that he is going on a pump on Wednesday and would like to know if we can order the Continuous Glucose Monitor for him       [facility] - [place], [place] - [address]

## 2017-08-16 NOTE — Telephone Encounter (Signed)
I spoke with the patient and explained that the 630 G does not have  CGM capability but the 670 does- patient stated he is going to look into getting the 670 and he will call back if he has any questions

## 2017-08-17 ENCOUNTER — Other Ambulatory Visit: Payer: 59

## 2017-08-17 DIAGNOSIS — Z794 Long term (current) use of insulin: Secondary | ICD-10-CM | POA: Diagnosis not present

## 2017-08-17 DIAGNOSIS — E1165 Type 2 diabetes mellitus with hyperglycemia: Secondary | ICD-10-CM

## 2017-08-18 ENCOUNTER — Encounter: Payer: Self-pay | Admitting: Internal Medicine

## 2017-08-20 ENCOUNTER — Encounter: Payer: Self-pay | Admitting: Internal Medicine

## 2017-08-20 ENCOUNTER — Encounter: Payer: Self-pay | Admitting: Family Medicine

## 2017-08-21 ENCOUNTER — Encounter: Payer: Self-pay | Admitting: Family Medicine

## 2017-08-21 LAB — GLUTAMIC ACID DECARBOXYLASE AUTO ABS: Glutamic Acid Decarb Ab: 25 IU/mL — ABNORMAL HIGH (ref <5)

## 2017-08-21 LAB — GLUCOSE, FASTING: Glucose, Plasma: 102 mg/dL — ABNORMAL HIGH (ref 65–99)

## 2017-08-21 LAB — C-PEPTIDE: C-Peptide: 0.14 ng/mL — ABNORMAL LOW (ref 0.80–3.85)

## 2017-08-22 ENCOUNTER — Encounter: Payer: Self-pay | Admitting: Family Medicine

## 2017-08-22 ENCOUNTER — Ambulatory Visit: Payer: 59 | Admitting: Family Medicine

## 2017-08-22 VITALS — BP 123/71 | HR 92 | Ht 72.0 in | Wt 195.0 lb

## 2017-08-22 DIAGNOSIS — L259 Unspecified contact dermatitis, unspecified cause: Secondary | ICD-10-CM | POA: Diagnosis not present

## 2017-08-22 NOTE — Progress Notes (Signed)
Nathaniel [name] is a 52 y.o. male who presents to [place] [place] today for genital rash.  Patient reports that he has had a red burning rash on his penis for approximately two weeks. He states that it occurred around the time that he utilized a new lubricant during intercourse. He denies dysuria, frequency, and penile discharge. He has had pain with intercourse. His wife has not noted any rash herself. He denies a history of similar rashes. He has utilized 1 day of myostat cream and several days of topical antibiotics, but neither has provided relief. He denies systemic symptoms, but does endorse fatigue over the last few weeks that he attributes to his ongoing mood disorder.    ROS as above:  Exam:  BP 123/71   Pulse 92   Ht 6' (1.829 m)   Wt 195 lb (88.5 kg)   BMI 26.45 kg/m  Gen: Well NAD HEENT: EOMI,  MMM Lungs: Normal work of breathing. CTABL Heart: RRR no MRG Abd: NABS, Soft. Nondistended, Nontender Exts: Brisk capillary refill, warm and well perfused.   Genitals Erythematous rash with minimal scaling on the dorsum of the penis just before the glands. No vesicles.  . No penile discharge or other lesions.  No Inguinal LAD No testicular mass or pain.   Lab and Radiology Results No results found for this or any previous visit (from the past 72 hour(s)). No results found.   Assessment and Plan: 51 y.o. male with skin rash.  The patient's rash is not characteristic of any STDs and he is not exhibiting the symptoms associated with common STDs. The erythematous rash is likely a contact dermatitis due to a recent use of a scented or irritating lubricant. We did not collect a sample today because it is partially treated. We recommend treating for both contact dermatitis and tinea with over the counter terbinafine cream and hydrocortisone cream. He should use the  Terbinafine cream for 2-3 weeks until better and the hydrocortisone cream for several days. We want to avoid thinning of the skin from the steroid. He should return if the rash is not getting better in the next 2-3 weeks, if it worsens, or if he starts exhibiting dysuria or purulent discharge.    No orders of the defined types were placed in this encounter.  No orders of the defined types were placed in this encounter.    Historical information moved to improve visibility of documentation.  Past Medical History:  Diagnosis Date  . Depression   . Diabetes mellitus    type II  . GERD (gastroesophageal reflux disease)   . Headache(784.0)   . Hypogonadism male   . Sleep apnea    Past Surgical History:  Procedure Laterality Date  . NASAL SINUS SURGERY  04/1991  . TONSILLECTOMY  1972   Social History   Tobacco Use  . Smoking status: Never Smoker  . Smokeless tobacco: Never Used  Substance Use Topics  . Alcohol use: No   family history includes Cancer (age of onset: 59) in his father; Diabetes Mellitus I in his son; Healthy in his mother. He was adopted.  Medications: Current Outpatient Medications  Medication Sig Dispense Refill  . Alcohol Swabs (ALCOHOL PREPS) PADS Use alcohol pads to clean site to be used to check blood sugar. 100 each 11  . aspirin EC 81 MG tablet Take 2 tablets (162 mg total) by mouth daily. 90 tablet 3  .  Blood Glucose Monitoring Suppl (FREESTYLE LITE) DEVI Use to check sugar 4 times daily. 1 each 0  . Continuous Blood Gluc Receiver (FREESTYLE LIBRE READER) DEVI 1 Device by Does not apply route 3 (three) times daily. 1 Device 1  . Continuous Blood Gluc Sensor (FREESTYLE LIBRE SENSOR SYSTEM) MISC 1 Device by Does not apply route every 30 (thirty) days. 3 each 11  . GLUCAGON EMERGENCY 1 MG injection INJECT 1 MG INTO THE MUSCLE ONCE AS NEEDED. 1 kit 3  . glucose blood (CONTOUR NEXT TEST) test strip Use as instructed to check blood sugar 4 times daily. 200 each  12  . glucose blood (FREESTYLE LITE) test strip Use as instructed to check sugar 6 times daily. 600 each 5  . HUMALOG KWIKPEN 100 UNIT/ML KiwkPen INJECT 0.06-0.1 MLS (6-10 UNITS TOTAL) INTO THE SKIN 3 (THREE) TIMES DAILY. 15 mL 5  . Insulin Degludec (TRESIBA FLEXTOUCH) 200 UNIT/ML SOPN Inject 28 Units into the skin daily. 3 pen 11  . insulin lispro (HUMALOG) 100 UNIT/ML injection Inject 0.55 mLs (55 Units total) into the skin daily. 10 mL 11  . JANUVIA 100 MG tablet TAKE ONE TABLET DAILY BEFORE BREAKFAST. 90 tablet 1  . Lancets (FREESTYLE) lancets Use as instructed to check sugar 4 times daily. 400 each 5  . lisinopril (PRINIVIL,ZESTRIL) 2.5 MG tablet Take 1 tablet (2.5 mg total) by mouth daily. 90 tablet 3  . metFORMIN (GLUCOPHAGE-XR) 500 MG 24 hr tablet TAKE 4 TABLETS BY MOUTH DAILY WITH SUPPER (Patient taking differently: TAKE 4 TABLETS BY MOUTH DAILY in the morning) 360 tablet 3  . ondansetron (ZOFRAN ODT) 4 MG disintegrating tablet Take one tab by mouth Q6hr prn nausea.  Dissolve under tongue. 12 tablet 0  . rosuvastatin (CRESTOR) 5 MG tablet Take 5 mg by mouth daily.    . Vilazodone HCl (VIIBRYD) 40 MG TABS Take 1 tablet (40 mg total) by mouth daily. 90 tablet 0   No current facility-administered medications for this visit.    Allergies  Allergen Reactions  . Cymbalta [Duloxetine Hcl] Other (See Comments)    Suicidal thoughts  . Influenza Vaccines Other (See Comments)    Unknown reaction  . Penicillins Other (See Comments)    Unknown - childhood allergy per mother  . Pioglitazone Other (See Comments)      headache  . Promethazine Hcl Nausea And Vomiting       . Reglan [Metoclopramide] Other (See Comments)    Jittery, increased anxiety    Health Maintenance Health Maintenance  Topic Date Due  . HIV Screening  09/02/1980  . INFLUENZA VACCINE  10/23/2017 (Originally 10/19/2017)  . HEMOGLOBIN A1C  12/17/2017  . FOOT EXAM  06/02/2018  . OPHTHALMOLOGY EXAM  07/19/2018  . Fecal  DNA (Cologuard)  02/29/2020  . TETANUS/TDAP  03/01/2021  . PNEUMOCOCCAL POLYSACCHARIDE VACCINE (2) 02/20/2022    Discussed warning signs or symptoms. Please see discharge instructions. Patient expresses understanding.

## 2017-08-22 NOTE — Patient Instructions (Signed)
Thank you for coming in today. Use over the counter Lamisil cream (terbinafine) daily for twice daily for 2 weeks.  Use over the counter hydrocortisone cream daily for a few days until it settles down.  We will follow. If not getting better let me know.     Contact Dermatitis Dermatitis is redness, soreness, and swelling (inflammation) of the skin. Contact dermatitis is a reaction to certain substances that touch the skin. There are two types of contact dermatitis:  Irritant contact dermatitis. This type is caused by something that irritates your skin, such as dry hands from washing them too much. This type does not require previous exposure to the substance for a reaction to occur. This type is more common.  Allergic contact dermatitis. This type is caused by a substance that you are allergic to, such as a nickel allergy or poison ivy. This type only occurs if you have been exposed to the substance (allergen) before. Upon a repeat exposure, your body reacts to the substance. This type is less common.  What are the causes? Many different substances can cause contact dermatitis. Irritant contact dermatitis is most commonly caused by exposure to:  Makeup.  Soaps.  Detergents.  Bleaches.  Acids.  Metal salts, such as nickel.  Allergic contact dermatitis is most commonly caused by exposure to:  Poisonous plants.  Chemicals.  Jewelry.  Latex.  Medicines.  Preservatives in products, such as clothing.  What increases the risk? This condition is more likely to develop in:  People who have jobs that expose them to irritants or allergens.  People who have certain medical conditions, such as asthma or eczema.  What are the signs or symptoms? Symptoms of this condition may occur anywhere on your body where the irritant has touched you or is touched by you. Symptoms include:  Dryness or flaking.  Redness.  Cracks.  Itching.  Pain or a burning  feeling.  Blisters.  Drainage of small amounts of blood or clear fluid from skin cracks.  With allergic contact dermatitis, there may also be swelling in areas such as the eyelids, mouth, or genitals. How is this diagnosed? This condition is diagnosed with a medical history and physical exam. A patch skin test may be performed to help determine the cause. If the condition is related to your job, you may need to see an occupational medicine specialist. How is this treated? Treatment for this condition includes figuring out what caused the reaction and protecting your skin from further contact. Treatment may also include:  Steroid creams or ointments. Oral steroid medicines may be needed in more severe cases.  Antibiotics or antibacterial ointments, if a skin infection is present.  Antihistamine lotion or an antihistamine taken by mouth to ease itching.  A bandage (dressing).  Follow these instructions at home: Skin Care  Moisturize your skin as needed.  Apply cool compresses to the affected areas.  Try taking a bath with: ? Epsom salts. Follow the instructions on the packaging. You can get these at your local pharmacy or grocery store. ? Baking soda. Pour a small amount into the bath as directed by your health care provider. ? Colloidal oatmeal. Follow the instructions on the packaging. You can get this at your local pharmacy or grocery store.  Try applying baking soda paste to your skin. Stir water into baking soda until it reaches a paste-like consistency.  Do not scratch your skin.  Bathe less frequently, such as every other day.  Bathe in lukewarm water.  Avoid using hot water. Medicines  Take or apply over-the-counter and prescription medicines only as told by your health care provider.  If you were prescribed an antibiotic medicine, take or apply your antibiotic as told by your health care provider. Do not stop using the antibiotic even if your condition starts to  improve. General instructions  Keep all follow-up visits as told by your health care provider. This is important.  Avoid the substance that caused your reaction. If you do not know what caused it, keep a journal to try to track what caused it. Write down: ? What you eat. ? What cosmetic products you use. ? What you drink. ? What you wear in the affected area. This includes jewelry.  If you were given a dressing, take care of it as told by your health care provider. This includes when to change and remove it. Contact a health care provider if:  Your condition does not improve with treatment.  Your condition gets worse.  You have signs of infection such as swelling, tenderness, redness, soreness, or warmth in the affected area.  You have a fever.  You have new symptoms. Get help right away if:  You have a severe headache, neck pain, or neck stiffness.  You vomit.  You feel very sleepy.  You notice red streaks coming from the affected area.  Your bone or joint underneath the affected area becomes painful after the skin has healed.  The affected area turns darker.  You have difficulty breathing. This information is not intended to replace advice given to you by your health care provider. Make sure you discuss any questions you have with your health care provider. Document Released: 03/04/2000 Document Revised: 08/13/2015 Document Reviewed: 07/23/2014 Elsevier Interactive Patient Education  2018 [employer]

## 2017-08-23 ENCOUNTER — Encounter: Payer: 59 | Attending: Internal Medicine | Admitting: Nutrition

## 2017-08-23 ENCOUNTER — Encounter: Payer: Self-pay | Admitting: Internal Medicine

## 2017-08-23 DIAGNOSIS — Z713 Dietary counseling and surveillance: Secondary | ICD-10-CM | POA: Insufficient documentation

## 2017-08-23 DIAGNOSIS — Z794 Long term (current) use of insulin: Secondary | ICD-10-CM | POA: Insufficient documentation

## 2017-08-23 DIAGNOSIS — E1065 Type 1 diabetes mellitus with hyperglycemia: Secondary | ICD-10-CM

## 2017-08-23 DIAGNOSIS — E1165 Type 2 diabetes mellitus with hyperglycemia: Secondary | ICD-10-CM | POA: Diagnosis not present

## 2017-08-23 NOTE — Progress Notes (Signed)
Pt. Is here to start back on his Medtronic 630G pump.  Setting put into pump per Dr. Charlean SanfilippoGherghe's written order. Basal rate:  MN: 0.9, 7AM: 1.0, 10PM: 0.9   I/C: 8, ISF: 40, timing 4 hours and target: 110-110.   Pt. Took Levemir at 7AM this morning, so pump was put in a 100% basal reduction mode for 18 hours.   He filled a cartridge and attached a Mio tubing,  He prefers this one over his self injecting other set.  He will call them to exchange the ones he has.  He was given 3 of the 6mm, 22 inch tubing sets.   We reviewed how to give a bolus, how to answer alerts and alarms and he was encourange to read over the booklets again, and he agreed to do this.  His meter was linked to the new pump.   He reports that he does not need help with carb counting.  He had no final questions

## 2017-08-24 ENCOUNTER — Other Ambulatory Visit: Payer: Self-pay | Admitting: Internal Medicine

## 2017-08-24 ENCOUNTER — Telehealth: Payer: Self-pay | Admitting: Internal Medicine

## 2017-08-24 ENCOUNTER — Encounter: Payer: Self-pay | Admitting: Internal Medicine

## 2017-08-24 NOTE — Progress Notes (Signed)
In the light of patient's new labs:  Component     Latest Ref Rng & Units 08/17/2017  Glutamic Acid Decarb Ab     <5 IU/mL 25 (H)  Glucose, Plasma     65 - 99 mg/dL 161102 (H)  C-Peptide     0.80 - 3.85 ng/mL 0.14 (L)  ZNT8 Antibodies      WILL FOLLOW  Islet Cell Ab     Neg:<1:1 Negative   His diagnosis has been changed from type 2 diabetes to type 1 diabetes.

## 2017-08-24 NOTE — Telephone Encounter (Signed)
Please advise on below  

## 2017-08-24 NOTE — Telephone Encounter (Signed)
Faxed to [employer]

## 2017-08-24 NOTE — Telephone Encounter (Signed)
Patient stated that his dx code need to be changed to diabetes type 1, so he can get his cgm

## 2017-08-24 NOTE — Telephone Encounter (Signed)
I printed an update  - can you plz fax to [employer]?

## 2017-08-25 ENCOUNTER — Encounter: Payer: Self-pay | Admitting: Internal Medicine

## 2017-08-25 ENCOUNTER — Ambulatory Visit: Payer: 59 | Admitting: Internal Medicine

## 2017-08-25 ENCOUNTER — Telehealth: Payer: Self-pay | Admitting: Internal Medicine

## 2017-08-25 VITALS — BP 118/78 | HR 64 | Ht 72.0 in | Wt 198.2 lb

## 2017-08-25 DIAGNOSIS — E1065 Type 1 diabetes mellitus with hyperglycemia: Secondary | ICD-10-CM | POA: Diagnosis not present

## 2017-08-25 LAB — ZNT8 ANTIBODIES: ZNT8 Antibodies: <15 U/mL

## 2017-08-25 LAB — POCT GLYCOSYLATED HEMOGLOBIN (HGB A1C): Hemoglobin A1C: 8.6 % — AB (ref 4.0–5.6)

## 2017-08-25 LAB — ANTI-ISLET CELL ANTIBODY: Islet Cell Ab: NEGATIVE

## 2017-08-25 NOTE — Telephone Encounter (Signed)
[employer] stated they need paperwork stating that patient has went from type 2 dm to type 1.     [phone]

## 2017-08-25 NOTE — Addendum Note (Signed)
Addended by: Yolande JollyLAWSON, Saniyyah Elster on: 08/25/2017 01:13 PM   Modules accepted: Orders

## 2017-08-25 NOTE — Progress Notes (Signed)
Patient ID: IZIC STFORT, male   DOB: 29-Jul-1965, 52 y.o.   MRN: [id number]  HPI: Nathaniel [name] is a 52 y.o.-year-old man, returning for f/u for DM2, dx 2001, insulin-dependent since 2010, uncontrolled, without complications. Last visit 3 mo ago.  Since last visit, we diagnosed type I, rather than type II Diabetes: Component     Latest Ref Rng & Units 08/17/2017  Glutamic Acid Decarb Ab     <5 IU/mL 25 (H)  Glucose, Plasma     65 - 99 mg/dL 102 (H)  C-Peptide     0.80 - 3.85 ng/mL 0.14 (L)  ZNT8 Antibodies     U/mL <15  Islet Cell Ab     Neg:<1:1 Negative   Last hemoglobin A1c was: Lab Results  Component Value Date   HGBA1C 9.8 06/16/2017   HGBA1C 9.0 02/20/2017   HGBA1C 9.6 11/18/2016  11/08/2013: 9.5% 04/11/2013: 9.8%  Previously on: - Januvia 100 mg daily in am. - Metformin 2000 mg daily in am. - Lantus 12 >> 14 units in am and 10 >> 14 units at bedtime >> Tresiba  - Humalog: 7-9 units depending on the size of the meal (we tried insulin to carb ratios and sensitivity factors in the past but this did not work well for him). He feels he is underdosing frequently due to fear of hypoglycemia. Also, forgets doses.  Inject Humalog 10-15 minutes before a meal. Stopped Tanzeum b/c N/V >> was in UC x2. Trulicity denied by insurance until he tried Victoza >> he did not want to try this.  He recently started a Medtronic 630 G.  He will also get a CGM.  Pump settings: - basal rates: 12 am: 0.9 units/h 7 am: 1  10 pm: 0.9 - ICR: 1:8 - target: 110-110 - ISF: 40 - Insulin on Board: 4h - bolus wizard: on TDD from basal insulin: 46% TDD from bolus insulin: 54% TDD 40 units - extended bolusing: not using - changes infusion site: q3 days - Meter: Bayer Contour  Sugars after switching to the pump 2 days ago:  - am:  100-130, 190 >> 110-170  >> 100-140 >> 88, 94 - 2h after b'fast: 172 >> n/c >> 160-220 >> 75 - before lunch: 130-210 >> 190s >> 140-215 >> 78-159 (corrected a  slight low) - 2h after lunch:111, 136, 137, 355, 374 >> n/c >> 155-215 >> 111, 122 - before dinner:  240-250 >> 200-230 >> 170-220 >> 65-124 - 2h after dinner:  180-240, up to 300s >> 145, 210 (corrected a slight low) - bedtime: 116-224 >> see above >> 120-160 >> see above     -  Nighttime: 43-83 >> 168-277 >> 214, 183 >> n/c Lowest: 60s >> 41 at night Highest: 329 (overcorrection of a low CBG)  Pt's meals are: - Breakfast: cereal + 1% milk, with protein bar - Lunch: sandwich - Dinner: eating out or eating in - Snacks: 1-2 a day  -No CKD, but last BUN/creatinine: Lab Results  Component Value Date   BUN 15 06/30/2017   CREATININE 0.94 06/30/2017   Normal GFR: Lab Results  Component Value Date   GFRNONAA >60 06/30/2017   GFRNONAA 76 11/18/2016   GFRNONAA 87 01/15/2016   GFRNONAA 83 10/13/2015   GFRNONAA 87 08/14/2015   GFRNONAA >90 05/20/2013   No MAU: Lab Results  Component Value Date   MICRALBCREAT 1.3 11/18/2016   MICRALBCREAT 4.1 06/06/2014   MICRALBCREAT 5.3 12/19/2012   MICRALBCREAT 2.8 02/23/2011  MICRALBCREAT 10.2 05/28/2010   MICRALBCREAT 3.4 12/18/2008  On lisinopril.  -No HL; last set of lipids: Lab Results  Component Value Date   CHOL 155 11/18/2016   HDL 56.20 11/18/2016   LDLCALC 74 11/18/2016   TRIG 125.0 11/18/2016   CHOLHDL 3 11/18/2016   - last eye exam was  06/2017: No DR - No numbness and tingling in his feet.   He also has OSA and uses a CPAP machine.  He continues running.  He ran several 5K's.  ROS: Constitutional: no weight gain/no weight loss, + fatigue, no subjective hyperthermia, no subjective hypothermia, + nocturia Eyes: no blurry vision, no xerophthalmia ENT: no sore throat, no nodules palpated in throat, no dysphagia, no odynophagia, no hoarseness Cardiovascular: no CP/no SOB/no palpitations/no leg swelling Respiratory: no cough/no SOB/no wheezing Gastrointestinal: no N/no V/no D/no C/no acid reflux Musculoskeletal: no  muscle aches/no joint aches Skin: no rashes, no hair loss Neurological: no tremors/no numbness/no tingling/no dizziness  I reviewed pt's medications, allergies, PMH, social hx, family hx, and changes were documented in the history of present illness. Otherwise, unchanged from my initial visit note. Started Viibrid.  Past Medical History:  Diagnosis Date  . Depression   . Diabetes mellitus    type II  . GERD (gastroesophageal reflux disease)   . Headache(784.0)   . Hypogonadism male   . Sleep apnea    Past Surgical History:  Procedure Laterality Date  . NASAL SINUS SURGERY  04/1991  . TONSILLECTOMY  1972   Social History   Socioeconomic History  . Marital status: Married    Spouse name: [name]  . Number of children: 2  . Years of education: Not on file  . Highest education level: Not on file  Occupational History    Employer: [employer]    Comment: Mental Health Case Management  Social Needs  . Financial resource strain: Not on file  . Food insecurity:    Worry: Not on file    Inability: Not on file  . Transportation needs:    Medical: Not on file    Non-medical: Not on file  Tobacco Use  . Smoking status: Never Smoker  . Smokeless tobacco: Never Used  Substance and Sexual Activity  . Alcohol use: No  . Drug use: No  . Sexual activity: Not on file  Lifestyle  . Physical activity:    Days per week: Not on file    Minutes per session: Not on file  . Stress: Not on file  Relationships  . Social connections:    Talks on phone: Not on file    Gets together: Not on file    Attends religious service: Not on file    Active member of club or organization: Not on file    Attends meetings of clubs or organizations: Not on file    Relationship status: Not on file  . Intimate partner violence:    Fear of current or ex partner: Not on file    Emotionally abused: Not on file    Physically abused: Not on file    Forced sexual activity: Not on file  Other Topics  Concern  . Not on file  Social History Narrative   He lives with wife and two children.   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   Highest level of education: master degree   Current Outpatient Medications on File Prior to Visit  Medication Sig Dispense Refill  . Alcohol Swabs (ALCOHOL  PREPS) PADS Use alcohol pads to clean site to be used to check blood sugar. 100 each 11  . aspirin EC 81 MG tablet Take 2 tablets (162 mg total) by mouth daily. 90 tablet 3  . Blood Glucose Monitoring Suppl (FREESTYLE LITE) DEVI Use to check sugar 4 times daily. 1 each 0  . Continuous Blood Gluc Receiver (FREESTYLE LIBRE READER) DEVI 1 Device by Does not apply route 3 (three) times daily. 1 Device 1  . Continuous Blood Gluc Sensor (FREESTYLE LIBRE SENSOR SYSTEM) MISC 1 Device by Does not apply route every 30 (thirty) days. 3 each 11  . GLUCAGON EMERGENCY 1 MG injection INJECT 1 MG INTO THE MUSCLE ONCE AS NEEDED. 1 kit 3  . glucose blood (CONTOUR NEXT TEST) test strip Use as instructed to check blood sugar 4 times daily. 200 each 12  . glucose blood (FREESTYLE LITE) test strip Use as instructed to check sugar 6 times daily. 600 each 5  . HUMALOG KWIKPEN 100 UNIT/ML KiwkPen INJECT 0.06-0.1 MLS (6-10 UNITS TOTAL) INTO THE SKIN 3 (THREE) TIMES DAILY. 15 mL 5  . Insulin Degludec (TRESIBA FLEXTOUCH) 200 UNIT/ML SOPN Inject 28 Units into the skin daily. 3 pen 11  . insulin lispro (HUMALOG) 100 UNIT/ML injection Inject 0.55 mLs (55 Units total) into the skin daily. 10 mL 11  . JANUVIA 100 MG tablet TAKE ONE TABLET DAILY BEFORE BREAKFAST. 90 tablet 1  . Lancets (FREESTYLE) lancets Use as instructed to check sugar 4 times daily. 400 each 5  . lisinopril (PRINIVIL,ZESTRIL) 2.5 MG tablet Take 1 tablet (2.5 mg total) by mouth daily. 90 tablet 3  . metFORMIN (GLUCOPHAGE-XR) 500 MG 24 hr tablet TAKE 4 TABLETS BY MOUTH DAILY WITH SUPPER (Patient taking differently: TAKE 4 TABLETS BY MOUTH  DAILY in the morning) 360 tablet 3  . ondansetron (ZOFRAN ODT) 4 MG disintegrating tablet Take one tab by mouth Q6hr prn nausea.  Dissolve under tongue. 12 tablet 0  . rosuvastatin (CRESTOR) 5 MG tablet Take 5 mg by mouth daily.    . Vilazodone HCl (VIIBRYD) 40 MG TABS Take 1 tablet (40 mg total) by mouth daily. 90 tablet 0   No current facility-administered medications on file prior to visit.    Allergies  Allergen Reactions  . Cymbalta [Duloxetine Hcl] Other (See Comments)    Suicidal thoughts  . Influenza Vaccines Other (See Comments)    Unknown reaction  . Penicillins Other (See Comments)    Unknown - childhood allergy per mother  . Pioglitazone Other (See Comments)      headache  . Promethazine Hcl Nausea And Vomiting       . Reglan [Metoclopramide] Other (See Comments)    Jittery, increased anxiety   Family History  Adopted: Yes  Problem Relation Age of Onset  . Cancer Father 53       Prostate cancer  . Healthy Mother   . Diabetes Mellitus I Son     PE: BP 118/78   Pulse 64   Ht 6' (1.829 m)   Wt 198 lb 3.2 oz (89.9 kg)   SpO2 97%   BMI 26.88 kg/m  Body mass index is 26.88 kg/m.  Wt Readings from Last 3 Encounters:  08/25/17 198 lb 3.2 oz (89.9 kg)  08/22/17 195 lb (88.5 kg)  08/15/17 195 lb (88.5 kg)   Constitutional: Slightly overweight, in NAD Eyes: PERRLA, EOMI, no exophthalmos ENT: moist mucous membranes, no thyromegaly, no cervical lymphadenopathy Cardiovascular: RRR, No MRG Respiratory: CTA  B Gastrointestinal: abdomen soft, NT, ND, BS+ Musculoskeletal: no deformities, strength intact in all 4 Skin: moist, warm, no rashes Neurological: no tremor with outstretched hands, DTR normal in all 4  ASSESSMENT: 1. DM1, uncontrolled, without complications - Received records with C-peptide of 3.67 (0.8-3.9), from 06/16/2005 ([facility]). No associated glucose reported. - son with DM1 (not on a pump)  PLAN:   1. Patient with long-standing, uncontrolled  diabetes, previously diagnosed as type II, but with recent qualification as type 1 diabetes after a C-peptide level returned low and his GAD antibodies were returned high.. - She recently restarted back on his insulin pump, Medtronic 630 G as he was not getting good results on the basal-bolus insulin regimen.  He continues on metformin and Januvia, but at last visit, I advised him to stop Januvia when he runs out.  For now, we will continue on metformin. - Since he started the pump 2 days ago, his sugars improved dramatically, to the point of lows.  He is especially low after meals and he has occasional highs after overcorrection of the lows.  We discussed about how not to overcorrect the lows - At this visit, since his sugars are dropping after meals, we will increase his insulin to carb ratio. - Also, since he can drop his sugars significantly after a correction at night, we will increase his insulin sensitivity factor after 8 PM. - For now, we will leave the basal rates the same - He would like to start the CGM that communicates with his pump and I sent a diagnosis clarification to his insurance.  We are waiting for the paperwork - I advised him to: Patient Instructions  Please continue: - Januvia 100 mg daily in am. (stop when you run out) - Metformin 2000 mg daily in am.  Please change the pump settings as follows: - basal rates: 12 am: 0.9 units/h 7 am: 1  10 pm: 0.9 - ICR: 1:8 >> 1:10 - target: 110-110 - ISF:   12 am: 40 >> 55  6 am: 40  8 pm: 40 >> 55 - Insulin on Board: 4h - bolus wizard: on  Please return in 3 months.   - today, HbA1c is 8.6% (improved) - continue checking sugars at different times of the day - check 4x a day, rotating checks - advised for yearly eye exams >> he is UTD - Return to clinic in 3 mo with sugar log   - time spent with the patient: 40 min, of which >50% was spent in reviewing his pump downloads, discussing his hypo- and hyper-glycemic episodes,  reviewing previous labs and pump settings and developing a plan to avoid hypo- and hyper-glycemia.   Philemon Kingdom, MD PhD [facility]

## 2017-08-25 NOTE — Patient Instructions (Addendum)
Please continue: - Januvia 100 mg daily in am. (stop when you run out) - Metformin 2000 mg daily in am.  Please change the pump settings as follows: - basal rates: 12 am: 0.9 units/h 7 am: 1  10 pm: 0.9 - ICR: 1:8 >> 1:10 - target: 110-110 - ISF:   12 am: 40 >> 55  6 am: 40  8 pm: 40 >> 55 - Insulin on Board: 4h - bolus wizard: on  Please return in 3 months.

## 2017-08-25 NOTE — Telephone Encounter (Signed)
This has been sent to them as of 08/24/17. Pt is aware and we are still pending paper work from them. No call back number listed.

## 2017-08-27 NOTE — Patient Instructions (Addendum)
Read over booklets 1 and 2 again.   Call 800 number if questions Test blood sugars before meals and ate bedtime Sign up with [facility] and download pump.  Call us with User name and password

## 2017-08-29 ENCOUNTER — Telehealth: Payer: Self-pay | Admitting: Nutrition

## 2017-08-29 MED FILL — LISINOPRIL 2.5 MG TABLET: 2.5 | 90 days supply | Qty: 90 | Fill #2

## 2017-08-29 NOTE — Telephone Encounter (Signed)
Message left on my machine that he is having trouble with the mio infusions sets.  He reports that his blood sugar is 336, and he has given himself an injection of insulin.  The message was timed at 3:59PM I tried calling him back, but had to leave a message to call me ASAP 5;05PM: left another message saying that if his blood sugars are still high, he needs to go to the ER.  Told him that I will be here for 5 more minutes if he can call me back.

## 2017-08-30 ENCOUNTER — Encounter: Payer: Self-pay | Admitting: Internal Medicine

## 2017-08-30 NOTE — Telephone Encounter (Signed)
Message left on machine to call me reguarding the infusion set last night, and whether the canula was bent.  Also noted that I want to get together with him to give him a sample of the needle infusion sets and show him some alternate sites to use for insertion of infusion sets.

## 2017-08-30 NOTE — Telephone Encounter (Signed)
Thank you, Bonita QuinLinda. Noted!

## 2017-08-30 NOTE — Telephone Encounter (Signed)
Pt. Called back at 5:47 PM saying his blood sugar had come down to 247 in 1/5 hours after giving the injection.  He reports having changed his infusion set and feels that his blood sugar will return to normal soon.  I will call him in the AM to set up an appt. To discuss the needle infusion sets and using all new sites.

## 2017-08-31 ENCOUNTER — Telehealth: Payer: Self-pay

## 2017-08-31 NOTE — Telephone Encounter (Signed)
Pt called about [employer] telling him his paperwork was filled out incorrectly, informed him I have a pending message to Bonita QuinLinda who has been assisting him. She is out of the office the rest of the week but was given some paperwork 08/30/17 from [employer]. [facility] as soon as she is available to find out the status of that paperwork and if she needs anything from me. Pt verbalized understanding

## 2017-09-03 DIAGNOSIS — E1065 Type 1 diabetes mellitus with hyperglycemia: Secondary | ICD-10-CM | POA: Diagnosis not present

## 2017-09-05 ENCOUNTER — Encounter: Payer: 59 | Admitting: Nutrition

## 2017-09-05 DIAGNOSIS — E1065 Type 1 diabetes mellitus with hyperglycemia: Secondary | ICD-10-CM

## 2017-09-05 NOTE — Patient Instructions (Signed)
Read over CGM manual before starting the sensor Call if need an appointment for further instruction Try new infusion sets and call [employer] to send more if you like them.

## 2017-09-05 NOTE — Progress Notes (Signed)
Patient called yesterday saying he again has had a severe high blood sugar requiring a syringe injection of insulin. He reports that when he pulled out his infusion set, it was not bent.  He was shown on his abdomen that he has some hardened scar tissue.  I explained that the blood flow is less to these areas, an the insulin may not get absorbed well from these sites.  He was told to get some lotion and to massage these areas for 20 min. Each day to try to break down the scare tissue.  He agreed to do this.  New sites were shown to him--upper and lower buttocks areas, and thigh areas.  He agreed to uses these sites. We reviewed how to use the Mio infusion sets, and he was shown the sure T sets that are needle infusion sets.  He was given a sample of these to try.   He also said that he was getting the sensors on Thursday. We set up the alert settings of his 630G pump:  High: 250, with alert on high only,and low setting: 70, with alert before low.  Also, he was told to turn on the suspend before low feature. And I explained what this was ,and what it will do.   He was shown how to insert the sensor, but he said this was a lot of information for him today.  He will call me if he has questions about sensor settings and insertions, or set up an appt. With me for further training on this, if he feels he needs this.  Telephone number given. He had no final questions.

## 2017-09-06 ENCOUNTER — Encounter: Payer: Self-pay | Admitting: Internal Medicine

## 2017-09-07 ENCOUNTER — Encounter: Payer: Self-pay | Admitting: Family Medicine

## 2017-09-08 DIAGNOSIS — E1065 Type 1 diabetes mellitus with hyperglycemia: Secondary | ICD-10-CM | POA: Diagnosis not present

## 2017-09-10 NOTE — Telephone Encounter (Signed)
Paperwork was faxed on 09/04/17

## 2017-09-12 ENCOUNTER — Encounter: Payer: 59 | Admitting: Nutrition

## 2017-09-12 ENCOUNTER — Other Ambulatory Visit: Payer: Self-pay

## 2017-09-12 ENCOUNTER — Telehealth: Payer: Self-pay

## 2017-09-12 DIAGNOSIS — E1065 Type 1 diabetes mellitus with hyperglycemia: Secondary | ICD-10-CM

## 2017-09-12 MED ORDER — GLUCOSE BLOOD VI STRP: ORAL_STRIP | 12 refills | Status: DC

## 2017-09-12 NOTE — Telephone Encounter (Signed)
Patient called to request assistance with CGM he stated sensor came out I spoke to [facility] and she agreed to call him back to assist.

## 2017-09-12 NOTE — Patient Instructions (Signed)
Calibrate the sensor twice in the next 4 hours, and then once every 8 hours. Call 800 number on back of pump if sensor problems.

## 2017-09-12 NOTE — Telephone Encounter (Signed)
Pt. Reported that when he was with a patient, the low alert went off, saying his blood sugar was 44.  He tested and it was 120.  He was very upset and requested that the alarms be turned off.  I talked him throught shutting off all alarms for highs and low.  After speaking with the representative, I told Nathaniel [name] that "the sensor is not very accurate the first day after insertion".  He was very discouraged, but I told him to keep using this, for the length of the sensor and decide after, if he wants to continue this.  He agreed to do this, and call me tomorrow.

## 2017-09-12 NOTE — Progress Notes (Signed)
Pt. Was trained on the [employer] CGM.  He started his sensor and was instructed on when/how to calibrate the sensor Settings were put into alarms: Low alert: 70 with suspend before low turned on, and High alert 250 with alert on high.   He had no final questions and signed the checlist as understanding all topics.   He reports using the needle infusion sets, and has not had any problems using them with high readings.

## 2017-09-13 ENCOUNTER — Telehealth: Payer: Self-pay | Admitting: Nutrition

## 2017-09-13 MED FILL — METFORMIN HCL ER 500 MG TAB: 500 | 90 days supply | Qty: 360 | Fill #1

## 2017-09-13 NOTE — Telephone Encounter (Signed)
Pt. Very upset yesterday.  His sensor would not calibrate for 3 hours.  At 3PM, he was with a client and his low glucose alarm went off, saying it was 44.  His blood sugar read 124.  I instructed him, at his request to stop all alarms. Today he reports that at 1:30AM his low suspend before low alarm went off, but his reading was 90.  He tried to recalibrate it, and drink some juice, but by 2:30 he was so frustrated, he removed it, and wants to send it back.  I have called Cathlean CowerLesley to see if she can help him.  She agreed to call him today.

## 2017-09-14 MED FILL — FREESTYLE LIBRE SENSOR SYST: 30 days supply | Qty: 3 | Fill #9

## 2017-09-15 DIAGNOSIS — G4733 Obstructive sleep apnea (adult) (pediatric): Secondary | ICD-10-CM | POA: Diagnosis not present

## 2017-09-17 ENCOUNTER — Encounter: Payer: Self-pay | Admitting: Adult Health

## 2017-09-17 ENCOUNTER — Encounter: Payer: Self-pay | Admitting: Internal Medicine

## 2017-09-18 ENCOUNTER — Telehealth: Payer: Self-pay | Admitting: Internal Medicine

## 2017-09-18 DIAGNOSIS — G4733 Obstructive sleep apnea (adult) (pediatric): Secondary | ICD-10-CM

## 2017-09-18 NOTE — Telephone Encounter (Signed)
Spoke with pt. He is aware of CY's response. Order has been placed. Nothing further was needed.

## 2017-09-18 NOTE — Telephone Encounter (Signed)
Lets see if treating for insomnia helps this-  Offer Trazodone 50 mg, # 30, 1 at bedtime as needed for sleep  Ref x 5

## 2017-09-18 NOTE — Telephone Encounter (Signed)
Called and spoke to pt.  Pt is requesting that cpap pressure be changed, as his AHI has been between 17-28 the last three night per his machine.  Pt stated he is awakening not feeling well rested in the mornings.  DL has been printed and placed on CY's chart.   CY please advise.   Current Outpatient Medications on File Prior to Visit  Medication Sig Dispense Refill  . Alcohol Swabs (ALCOHOL PREPS) PADS Use alcohol pads to clean site to be used to check blood sugar. 100 each 11  . aspirin EC 81 MG tablet Take 2 tablets (162 mg total) by mouth daily. 90 tablet 3  . Blood Glucose Monitoring Suppl (FREESTYLE LITE) DEVI Use to check sugar 4 times daily. 1 each 0  . Continuous Blood Gluc Receiver (FREESTYLE LIBRE READER) DEVI 1 Device by Does not apply route 3 (three) times daily. 1 Device 1  . Continuous Blood Gluc Sensor (FREESTYLE LIBRE SENSOR SYSTEM) MISC 1 Device by Does not apply route every 30 (thirty) days. 3 each 11  . GLUCAGON EMERGENCY 1 MG injection INJECT 1 MG INTO THE MUSCLE ONCE AS NEEDED. 1 kit 3  . glucose blood (CONTOUR NEXT TEST) test strip Use as instructed to check blood sugar 5 times daily. 450 each 12  . insulin lispro (HUMALOG) 100 UNIT/ML injection Inject 0.55 mLs (55 Units total) into the skin daily. 10 mL 11  . JANUVIA 100 MG tablet TAKE ONE TABLET DAILY BEFORE BREAKFAST. 90 tablet 1  . Lancets (FREESTYLE) lancets Use as instructed to check sugar 4 times daily. 400 each 5  . lisinopril (PRINIVIL,ZESTRIL) 2.5 MG tablet Take 1 tablet (2.5 mg total) by mouth daily. 90 tablet 3  . metFORMIN (GLUCOPHAGE-XR) 500 MG 24 hr tablet TAKE 4 TABLETS BY MOUTH DAILY WITH SUPPER (Patient taking differently: TAKE 4 TABLETS BY MOUTH DAILY in the morning) 360 tablet 3  . ondansetron (ZOFRAN ODT) 4 MG disintegrating tablet Take one tab by mouth Q6hr prn nausea.  Dissolve under tongue. 12 tablet 0  . rosuvastatin (CRESTOR) 5 MG tablet Take 5 mg by mouth daily.    . Vilazodone HCl (VIIBRYD) 40  MG TABS Take 1 tablet (40 mg total) by mouth daily. 90 tablet 0   No current facility-administered medications on file prior to visit.     Allergies  Allergen Reactions  . Cymbalta [Duloxetine Hcl] Other (See Comments)    Suicidal thoughts  . Influenza Vaccines Other (See Comments)    Unknown reaction  . Penicillins Other (See Comments)    Unknown - childhood allergy per mother  . Pioglitazone Other (See Comments)      headache  . Promethazine Hcl Nausea And Vomiting       . Reglan [Metoclopramide] Other (See Comments)    Jittery, increased anxiety

## 2017-09-18 NOTE — Telephone Encounter (Signed)
Left message for patient to call back  

## 2017-09-18 NOTE — Telephone Encounter (Signed)
Called and spoke to pt and relayed below message.  Pt stated he is waking every 2-3hr throughout the night, and feels restless.    CY please advise. Thanks

## 2017-09-18 NOTE — Telephone Encounter (Signed)
Pt is calling back [phone]

## 2017-09-18 NOTE — Telephone Encounter (Signed)
Spoke with pt. He is aware of Dr. Roxy CedarYoung's recommendation. Pt does not want to take any medication to help him sleep. States that his AHI has been over 20 for the past 3 nights. Stated, "I didn't call to get recommendations for pills. I want something done about my pressure. My AHI is too high."  Dr. Maple HudsonYoung - please advise. Thanks.

## 2017-09-18 NOTE — Telephone Encounter (Signed)
Order- DME please change his PAP pressure to auto 8-15 and check/ replace mask fit. Download in 2 weeks please.

## 2017-09-18 NOTE — Telephone Encounter (Signed)
The download we get indicates machine is set to use its full range already- I can't turn it up more. The AHI on this report is 7.4/ hr, which is only minimally elevated.  Is he aware of being awake or restless during the night?

## 2017-09-19 ENCOUNTER — Encounter: Payer: Self-pay | Admitting: Internal Medicine

## 2017-09-19 ENCOUNTER — Encounter: Payer: Self-pay | Admitting: Adult Health

## 2017-09-19 ENCOUNTER — Telehealth: Payer: Self-pay | Admitting: Internal Medicine

## 2017-09-19 ENCOUNTER — Ambulatory Visit: Payer: 59 | Admitting: Adult Health

## 2017-09-19 DIAGNOSIS — G4733 Obstructive sleep apnea (adult) (pediatric): Secondary | ICD-10-CM

## 2017-09-19 DIAGNOSIS — E10649 Type 1 diabetes mellitus with hypoglycemia without coma: Secondary | ICD-10-CM

## 2017-09-19 NOTE — Progress Notes (Signed)
_0  ID: Nathaniel [name], male    DOB: Jul 26, 1965, 52 y.o.   MRN: [id number]  Chief Complaint  Patient presents with  . Follow-up    OSA     Referring provider: Gregor Hams, MD  HPI: 52 year old male never smoker followed for obstructive sleep apnea Past medical history significant for diabetes and recurrent sinusitis  TEST  Home sleep study April 2018-AHI 14.7/hour, desaturation to 79%, body weight at 189 pounds  09/19/2017 Follow up : OSA  Patient presents for a 1 year follow up for OSA .says doing well until 2 weeks ago .  Started to notice having more sleep apnea events with high AHI and more daytime sleepiness.  Mask is relatively new , just 30 weeks old. No leaks .  Download shows excellent compliance with average usage at 7.5 hours.  Patient is on auto CPAP 5 to 20 cm H2O.  AHI 7.8.  Minimum leaks. Recently dx with Type 1 DM now on insulin pumps. Has had few low sugars below 90 . -few during nighttime around 50  Denies any new medications no new sedating medications.(other than insulin )    Allergies  Allergen Reactions  . Cymbalta [Duloxetine Hcl] Other (See Comments)    Suicidal thoughts  . Influenza Vaccines Other (See Comments)    Unknown reaction  . Penicillins Other (See Comments)    Unknown - childhood allergy per mother  . Pioglitazone Other (See Comments)      headache  . Promethazine Hcl Nausea And Vomiting       . Reglan [Metoclopramide] Other (See Comments)    Jittery, increased anxiety    Immunization History  Administered Date(s) Administered  . Pneumococcal Polysaccharide-23 02/20/2017  . Tdap 03/02/2011    Past Medical History:  Diagnosis Date  . Depression   . Diabetes mellitus    type II  . GERD (gastroesophageal reflux disease)   . Headache(784.0)   . Hypogonadism male   . Sleep apnea     Tobacco History: Social History   Tobacco Use  Smoking Status Never Smoker  Smokeless Tobacco Never Used   Counseling given: Not  Answered   Outpatient Medications Prior to Visit  Medication Sig Dispense Refill  . Alcohol Swabs (ALCOHOL PREPS) PADS Use alcohol pads to clean site to be used to check blood sugar. 100 each 11  . aspirin EC 81 MG tablet Take 2 tablets (162 mg total) by mouth daily. 90 tablet 3  . Blood Glucose Monitoring Suppl (FREESTYLE LITE) DEVI Use to check sugar 4 times daily. 1 each 0  . Continuous Blood Gluc Receiver (FREESTYLE LIBRE READER) DEVI 1 Device by Does not apply route 3 (three) times daily. 1 Device 1  . Continuous Blood Gluc Sensor (FREESTYLE LIBRE SENSOR SYSTEM) MISC 1 Device by Does not apply route every 30 (thirty) days. 3 each 11  . GLUCAGON EMERGENCY 1 MG injection INJECT 1 MG INTO THE MUSCLE ONCE AS NEEDED. 1 kit 3  . glucose blood (CONTOUR NEXT TEST) test strip Use as instructed to check blood sugar 5 times daily. 450 each 12  . insulin lispro (HUMALOG) 100 UNIT/ML injection Inject 0.55 mLs (55 Units total) into the skin daily. 10 mL 11  . JANUVIA 100 MG tablet TAKE ONE TABLET DAILY BEFORE BREAKFAST. 90 tablet 1  . Lancets (FREESTYLE) lancets Use as instructed to check sugar 4 times daily. 400 each 5  . lisinopril (PRINIVIL,ZESTRIL) 2.5 MG tablet Take 1 tablet (2.5 mg total) by mouth  daily. 90 tablet 3  . metFORMIN (GLUCOPHAGE-XR) 500 MG 24 hr tablet TAKE 4 TABLETS BY MOUTH DAILY WITH SUPPER (Patient taking differently: TAKE 4 TABLETS BY MOUTH DAILY in the morning) 360 tablet 3  . ondansetron (ZOFRAN ODT) 4 MG disintegrating tablet Take one tab by mouth Q6hr prn nausea.  Dissolve under tongue. 12 tablet 0  . rosuvastatin (CRESTOR) 5 MG tablet Take 5 mg by mouth daily.    . Vilazodone HCl (VIIBRYD) 40 MG TABS Take 1 tablet (40 mg total) by mouth daily. 90 tablet 0   No facility-administered medications prior to visit.      Review of Systems  Constitutional:   No  weight loss, night sweats,  Fevers, chills,  _+fatigue, or  lassitude.  HEENT:   No headaches,  Difficulty  swallowing,  Tooth/dental problems, or  Sore throat,                No sneezing, itching, ear ache, nasal congestion, post nasal drip,   CV:  No chest pain,  Orthopnea, PND, swelling in lower extremities, anasarca, dizziness, palpitations, syncope.   GI  No heartburn, indigestion, abdominal pain, nausea, vomiting, diarrhea, change in bowel habits, loss of appetite, bloody stools.   Resp: No shortness of breath with exertion or at rest.  No excess mucus, no productive cough,  No non-productive cough,  No coughing up of blood.  No change in color of mucus.  No wheezing.  No chest wall deformity  Skin: no rash or lesions.  GU: no dysuria, change in color of urine, no urgency or frequency.  No flank pain, no hematuria   MS:  No joint pain or swelling.  No decreased range of motion.  No back pain.    Physical Exam  BP 110/74 (BP Location: Left Arm, Cuff Size: Normal)   Pulse 71   Ht _0  (1.88 m)   Wt 198 lb 6.4 oz (90 kg)   SpO2 99%   BMI 25.47 kg/m   GEN: A/Ox3; pleasant , NAD, well nourished    HEENT:  [place]/AT,  EACs-clear, TMs-wnl, NOSE-clear, THROAT-clear, no lesions, no postnasal drip or exudate noted. Class 2 MP airway   NECK:  Supple w/ fair ROM; no JVD; normal carotid impulses w/o bruits; no thyromegaly or nodules palpated; no lymphadenopathy.    RESP  Clear  P & A; w/o, wheezes/ rales/ or rhonchi. no accessory muscle use, no dullness to percussion  CARD:  RRR, no m/r/g, no peripheral edema, pulses intact, no cyanosis or clubbing.  GI:   Soft & nt; nml bowel sounds; no organomegaly or masses detected.   Musco: Warm bil, no deformities or joint swelling noted.   Neuro: alert, no focal deficits noted.    Skin: Warm, no lesions or rashes    Lab Results:  CBC  BNP No results found for: BNP  ProBNP No results found for: PROBNP  Imaging: No results found.   Assessment & Plan:   Obstructive sleep apnea Mild to moderate sleep apnea with previously well  controlled on CPAP.  Recently has had increased apneic events an elevated AHI.  Unclear what is causing this.  As patient is not on any new sedative type medication mask is fitting well and appears to be new.  Download shows excellent compliance.  Patient has recently been diagnosed with type 1 diabetes (transition from type II) and is now on an insulin pump.  I do note that he has had nocturnal hypoglycemia episodes.  This  is possible what could be contributing to his nocturnal awakening disrupted sleep and potentially contributed to increased events. We will change CPAP pressure to 8 to 15 cm H2O.  Download in 2 weeks.  If not improving will need a CPAP titration study  Plan  Patient Instructions  Continue on CPAP at bedtime Change CPAP pressure to 8 to 15cm H20  Download in 2 weeks .  If not better will need CPAP titration study  Work on healthy weight Do not drive a sleepy Discuss with Endocrinology that having low sugars during nighttime  Follow-up with Dr. Annamaria Boots in 1 year and as needed      Type 1 diabetes mellitus, uncontrolled ([place]) Since beginning insulin pump patient is having a few nocturnal hypoglycemic episodes will need to discuss with endocrinology if insulin right needs to be adjusted.      Rexene Edison, NP 09/19/2017

## 2017-09-19 NOTE — Addendum Note (Signed)
Addended by: Boone MasterJONES, Glorene Leitzke E on: 09/19/2017 05:21 PM   Modules accepted: Orders

## 2017-09-19 NOTE — Assessment & Plan Note (Signed)
Mild to moderate sleep apnea with previously well controlled on CPAP.  Recently has had increased apneic events an elevated AHI.  Unclear what is causing this.  As patient is not on any new sedative type medication mask is fitting well and appears to be new.  Download shows excellent compliance.  Patient has recently been diagnosed with type 1 diabetes (transition from type II) and is now on an insulin pump.  I do note that he has had nocturnal hypoglycemia episodes.  This is possible what could be contributing to his nocturnal awakening disrupted sleep and potentially contributed to increased events. We will change CPAP pressure to 8 to 15 cm H2O.  Download in 2 weeks.  If not improving will need a CPAP titration study  Plan  Patient Instructions  Continue on CPAP at bedtime Change CPAP pressure to 8 to 15cm H20  Download in 2 weeks .  If not better will need CPAP titration study  Work on healthy weight Do not drive a sleepy Discuss with Endocrinology that having low sugars during nighttime  Follow-up with Dr. Maple HudsonYoung in 1 year and as needed

## 2017-09-19 NOTE — Assessment & Plan Note (Signed)
Since beginning insulin pump patient is having a few nocturnal hypoglycemic episodes will need to discuss with endocrinology if insulin right needs to be adjusted.

## 2017-09-19 NOTE — Telephone Encounter (Signed)
Called and spoke with patient regarding CPAP AHI was 19.4 last night. Last ov with CY was 10-20-16; has ROV 10-20-17. Pt is requesting to be seen earlier than ROV 10-20-17 appt with CY; with CY or NP. Pt states he took melatonin last night and results worsen Pt is having increase daytime sleepiness, worsen sleep.  Called and spoke with [place] [facility] at phone [phone] Ext [phone] She states patient AHI was 19.4 last night, and avg 17.8 for 6 days prior  Pt has good seal on mask, new supplies have been ordered for pt yesterday.  Pt does not want anymore RX for sleeping, he would like to be seen by CY or NP as soon as possible.  CY please advise.

## 2017-09-19 NOTE — Telephone Encounter (Signed)
Ok to see NP if I don't have opening

## 2017-09-19 NOTE — Patient Instructions (Addendum)
Continue on CPAP at bedtime Change CPAP pressure to 8 to 15cm H20  Download in 2 weeks .  If not better will need CPAP titration study  Work on healthy weight Do not drive a sleepy Discuss with Endocrinology that having low sugars during nighttime  Follow-up with Dr. Maple HudsonYoung in 1 year and as needed

## 2017-09-19 NOTE — Telephone Encounter (Signed)
Called and spoke to patient. Scheduled patient to see Tammy P, NP today at 1630. Nothing further needed at this time.

## 2017-09-20 ENCOUNTER — Telehealth: Payer: Self-pay | Admitting: Internal Medicine

## 2017-09-20 ENCOUNTER — Telehealth: Payer: Self-pay | Admitting: Nutrition

## 2017-09-20 ENCOUNTER — Ambulatory Visit (INDEPENDENT_AMBULATORY_CARE_PROVIDER_SITE_OTHER): Payer: 59 | Admitting: Internal Medicine

## 2017-09-20 DIAGNOSIS — E10649 Type 1 diabetes mellitus with hypoglycemia without coma: Secondary | ICD-10-CM

## 2017-09-20 DIAGNOSIS — G4733 Obstructive sleep apnea (adult) (pediatric): Secondary | ICD-10-CM

## 2017-09-20 NOTE — Telephone Encounter (Signed)
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

## 2017-09-20 NOTE — Progress Notes (Signed)
Reports insulin pump reports downloaded per patient's request (from 6/20-09/20/2017). Patient called us that his sugars are dropping at night.  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  Reviewing the reports he is occasionally dropping his sugars from bedtime, when they are high in the 200s to fasting, in a.m., when they are between 75-80.  We will therefore reduce his correction insulin in the evening by increasing his ISF from 9 PM to 6 AM from 55-65.  If his sugars improve and he is not dropping his sugars overnight anymore, I would suggest to decrease his insulin to carb ratio with dinner next.  This is currently set at 1:10

## 2017-09-20 NOTE — Telephone Encounter (Addendum)
Dr. Maple HudsonYoung pt Last OV 09/19/2017 with TP  Spoke with pt, he states the dme company will change his CPAP pressure by noon today. Pt states he would rather just do a CPAP titration to get to the bottom of this. He doesn't think the pressure change will make any difference at this point. Please advise. I can order CPAP tirtation but wanted to run this by TP since he has not tried the cpap on the new settings.    Patient Instructions by Julio SicksParrett, Tammy S, NP at 09/19/2017 4:30 PM  Author: Julio SicksParrett, Tammy S, NP Author Type: Nurse Practitioner Filed: 09/19/2017 5:11 PM  Note Status: Addendum Cosign: Cosign Not Required Encounter Date: 09/19/2017  Editor: Julio SicksParrett, Tammy S, NP (Nurse Practitioner)  Prior Versions: 1. Parrett, Virgel Bouquetammy S, NP (Nurse Practitioner) at 09/19/2017 5:09 PM - Addendum   2. Parrett, Virgel Bouquetammy S, NP (Nurse Practitioner) at 09/19/2017 4:47 PM - Signed    Continue on CPAP at bedtime Change CPAP pressure to 8 to 15cm H20  Download in 2 weeks .  If not better will need CPAP titration study  Work on healthy weight Do not drive a sleepy Discuss with Endocrinology that having low sugars during nighttime  Follow-up with Dr. Maple HudsonYoung in 1 year and as needed

## 2017-09-20 NOTE — Telephone Encounter (Signed)
Phone call earlier about having lows the last 2-3 nights.  Also Emailed Dr. Elvera LennoxGherghe.   Per Dr. Charlean SanfilippoGherghe's order, Patient was called back, and talked him through making the changes to the ISF from 9PM to MN from 50 to 65.  Discussed with him, that if he is still running high after supper, to call us for an I/C ratio change at supper.  He reports putting in the exact carbs eaten at supper, except when eating out, and not knowing how many carbs are eaten.

## 2017-09-22 ENCOUNTER — Encounter: Payer: Self-pay | Admitting: Internal Medicine

## 2017-09-22 ENCOUNTER — Telehealth: Payer: Self-pay | Admitting: Internal Medicine

## 2017-09-22 NOTE — Telephone Encounter (Signed)
See previous message from 7/3.

## 2017-09-22 NOTE — Telephone Encounter (Signed)
Called and spoke with pt who states he is still having problems with his CPAP after settings were changed.  Pt states his AHI was 9.5 which he believes is still too high for him. Pt did call 7/3 but has not heard anything from anybody after he originally called two days ago.  Dr. Maple HudsonYoung, please see previous messages from 7/3 when pt originally called in and please advise on recommendations for pt. Thanks!

## 2017-09-22 NOTE — Telephone Encounter (Signed)
Please order a CPAP titration sleep study    Dx OSA

## 2017-09-22 NOTE — Telephone Encounter (Signed)
Order has been placed for pt to have cpap titration.  Attempted to call pt to let him know this has been done but unable to reach pt.  Left a detailed message for pt stating that we had ordered the cpap titration study and if he needed anything else from us to call our office.

## 2017-09-22 NOTE — Telephone Encounter (Signed)
Patient is scheduled for a CPAP Titration study on 7.7.19 Patient e-mail asking if his wearing an insulin pump would affect this Will e-mail patient back recommending that he contact the sleep lab regarding this >> [phone]

## 2017-09-22 NOTE — Telephone Encounter (Signed)
pt states pressure on CPAP was changed, but is still to high; pt decline appointment; pt was upset and expect a phone call by noon today; pt contact # [phone]

## 2017-09-24 ENCOUNTER — Ambulatory Visit (HOSPITAL_BASED_OUTPATIENT_CLINIC_OR_DEPARTMENT_OTHER): Payer: 59 | Attending: Internal Medicine | Admitting: Internal Medicine

## 2017-09-24 VITALS — Ht 73.0 in | Wt 198.0 lb

## 2017-09-24 DIAGNOSIS — G4733 Obstructive sleep apnea (adult) (pediatric): Secondary | ICD-10-CM | POA: Insufficient documentation

## 2017-09-25 ENCOUNTER — Encounter: Payer: Self-pay | Admitting: Internal Medicine

## 2017-09-25 ENCOUNTER — Encounter: Payer: Self-pay | Admitting: Family Medicine

## 2017-09-25 MED FILL — CONTOUR NEXT STRIPS: 90 days supply | Qty: 450 | Fill #0

## 2017-09-25 NOTE — Telephone Encounter (Signed)
E-mail sent from patient:  Dr.Young,  I was unsuccessful at getting much sleep at the sleep lab. I think I slept for about an hour and then woke up wide awake and couldn't get back to sleep so they sent me home.  they said they would send you what data they had. Sorry for that. My anxiety at having all of those things on me made it impossible for me to sleep.  My AHI Saturday night was 24.5, so the higher setting up to 8 has really done nothing to improve my AHI.   Harriet ButteJeff Ryans  Routing to Dr Maple HudsonYoung as Lorain ChildesFYI on patient's difficult night at the sleep lab

## 2017-09-25 NOTE — Telephone Encounter (Signed)
Spoke with patient via phone call. Pt is understanding of CY's message and will come see CY this Friday 09/29/17 at 11:30am. Nothing more needed at this time.

## 2017-09-25 NOTE — Telephone Encounter (Signed)
I can't tell why there has been a change. If the home care company doesn't identify a change in mechanical function of the CPAP. We are going to need to sit down together to go through what is going on and answer questions.  I'm sorry the sleep study didn't go well. That was meant to clarify what is going on. I will work with my nurse when she returns tomorrow, to find an office appointment earlier than August.

## 2017-09-25 NOTE — Telephone Encounter (Signed)
Patient sent another e-mail and is requesting response from CY as soon as possible:  Nathaniel [name], Nathaniel [name]  to Jetty DuhamelYoung, Clinton D, MD   09/25/17 10:31 AM  what nobody has explained to me is if it has autoset then it is automatically adjusting to my breathing during the night anyway, so why do I need my settings adjusted if the machine is already doing that? I also don't understand why my numbers are suddenly bad when I have been doing this for 14 months. Just saying your sorry sounds patronizing to me. The NP seemed to think it had something to do with my blood sugars, but my numbers have not been low and my AHI is still high. I have [name] lot of questions and no answers at this point and when I call your office I get [name] run around, put on hold and told someone will take [name] message and then calls aren't returned.  I'm tired of doing [name] treatment that isn't working for me and I am exhausted all day long from not sleeping well. Then when I call your office I don't get calls returned or questions answered. I would like some answers such as what you all are going to do about this problem. Sometime soon because this is affecting my work.

## 2017-09-25 NOTE — Procedures (Signed)
The patient decided terminated the study due to him not being able to initiate and maintain sleep.

## 2017-09-29 ENCOUNTER — Encounter: Payer: Self-pay | Admitting: Internal Medicine

## 2017-09-29 ENCOUNTER — Ambulatory Visit (INDEPENDENT_AMBULATORY_CARE_PROVIDER_SITE_OTHER): Payer: 59 | Admitting: Internal Medicine

## 2017-09-29 VITALS — BP 114/62 | HR 75 | Ht 73.0 in | Wt 196.2 lb

## 2017-09-29 DIAGNOSIS — F329 Major depressive disorder, single episode, unspecified: Secondary | ICD-10-CM

## 2017-09-29 DIAGNOSIS — G4733 Obstructive sleep apnea (adult) (pediatric): Secondary | ICD-10-CM | POA: Diagnosis not present

## 2017-09-29 DIAGNOSIS — F32A Depression, unspecified: Secondary | ICD-10-CM

## 2017-09-29 MED ORDER — AMPHETAMINE-DEXTROAMPHETAMINE 10 MG PO TABS: ORAL_TABLET | ORAL | 0 refills | Status: DC

## 2017-09-29 MED FILL — ROSUVASTATIN CALCIUM 5 MG T: 5 | 90 days supply | Qty: 90 | Fill #1

## 2017-09-29 NOTE — Progress Notes (Addendum)
HPI male never smoker followed for OSA, complicated by headache, acute recurrent maxillary sinusitis, DM 2, GERD Unattended Home Sleep Test 07/05/16-AHI 14.7/hour, desaturation to 79%, body weight 189 pounds.  ----------------------------------------------------------------------------------------------------------------  09/29/2017- 52 year old male never smoker followed for OSA, complicated by headache, acute recurrent maxillary sinusitis, DM 1/ pump, GERD CPAP 8-15/  [facility] Seen 7/2 by NP with complaint of recent onset increased daytime sleepiness with high AHI on his download.  Our download at that time showed AHI 7.8/hour.  Recently put on insulin pump.  He has called with concern.  We tried changing his pressure range.  We then ordered a CPAP titration sleep study but he was only able to sleep  a total of 50 minutes out of the night. ----OSA: DME: [facility] Pt states he is having trouble with [facility] for supplies. Main reason for visit today is the AHI being up more than before and not sleeping well., Wife tells him she hears air from the CPAP mask but it does not sound like leak.  Some very occasional snoring.  Sleep is not obviously disturbed and he says he feels as if sleep quality is unchanged.  He complains of feeling tired or over the last few weeks, especially while driving around 1 or 2 PM.  His phone app is displaying AHI scores higher since around the ending of June, as high as 29.  That looks the same as the download result for those dates.  He wants to know what is different.  The only change we can identify is that he began insulin pump around the beginning of June.   ROS-see HPI    "+" = positive Constitutional:    +weight loss, night sweats, fevers, chills, +fatigue, lassitude. HEENT:   + headaches, difficulty swallowing, tooth/dental problems, sore throat,       sneezing, itching, ear ache, +nasal congestion, post nasal drip, +snoring, CV:    chest pain, orthopnea, PND, swelling in lower  extremities, anasarca,                                                   dizziness, palpitations Resp:   shortness of breath with exertion or at rest.                productive cough,   non-productive cough, coughing up of blood.              change in color of mucus.  wheezing.   Skin:    rash or lesions. GI:  No-   heartburn, indigestion, abdominal pain, nausea, vomiting, diarrhea,                 change in bowel habits, loss of appetite GU: dysuria, change in color of urine, no urgency or frequency.   flank pain. MS:   joint pain, stiffness, decreased range of motion, back pain. Neuro-     nothing unusual Psych:  change in mood or affect.  depression or anxiety.   memory loss.  OBJ- Physical Exam     Observational this visit General- Alert, Oriented, Affect-+quiet/reserved, Distress- none acute, + slender Skin- rash-none, lesions- none, excoriation- none Lymphadenopathy- none Head- atraumatic            Eyes- Gross vision intact, PERRLA, conjunctivae and secretions clear            Ears-  Hearing, canals-normal            Nose-  no-Septal dev, mucus, polyps, erosion, perforation             Throat- Mallampati III , mucosa clear , drainage- none, tonsils- atrophic Neck- flexible , trachea midline, no stridor , thyroid nl, carotid no bruit Chest - symmetrical excursion , unlabored           Heart/CV- RRR , no murmur , no gallop  , no rub, nl s1 s2                           - JVD- none , edema- none, stasis changes- none, varices- none           Lung- clear to P&A, wheeze- none, cough- none , dullness-none, rub- none           Chest wall-  Abd-  Br/ Gen/ Rectal- Not done, not indicated Extrem- cyanosis- none, clubbing, none, atrophy- none, strength- nl Neuro- grossly intact to observation      .

## 2017-09-29 NOTE — Patient Instructions (Addendum)
Order- Patient having increased AHI and daytime sleepiness. Please provide loaner machine auto 5-20 for 1 month trial with download after one month to see if this is a device issue.  I will try calling [employer] about possibility of signal interference.  Ok to try 1 puff Afrin nasal spray each nostril once daily at bedtime.  Script printed for Adderall 10 mg     Try 1-3 tabs daily as needed for excessive tiredness.

## 2017-09-29 NOTE — Assessment & Plan Note (Addendum)
A recent trial with CPAP 8-15 does not seem to have made a difference.  I cannot tell why he would be having poor control in the last few weeks. I need to review download again when it is released from scanning, but don't think centrals have increased.  We can try for a loaner machine to see if there is a mechanical issue even though his home care did not identify a problem.  I question the possibility that there could be a signal interference between his Airview download and his insulin pump.  He is using a ResMed AirSense10 autoset and a Medtronic 630G pump. I have contacted the regional [employer] rep to ask about this possibilty. I offered a short-term trial of Adderall for prn use due to his comments about driving safety. He told the nurse he wouldn't use that because it was "speed". He didn't raise this concern with me when we discussed it.

## 2017-09-29 NOTE — Assessment & Plan Note (Signed)
His affect is difficult for me to interpret.  He seemed very reserved/guarded and almost passive-aggressive although that may be my mistake.  I continue to try to help him.

## 2017-09-30 DIAGNOSIS — G4733 Obstructive sleep apnea (adult) (pediatric): Secondary | ICD-10-CM

## 2017-09-30 NOTE — Procedures (Signed)
Patient Name: Nathaniel [name], [name] Study Date: 09/24/2017 Gender: Male D.O.B: 07-Jan-1966 Age (years): [age] Referring Provider: Jetty Duhamellinton Jadie Allington MD, ABSM Height (inches): 73 Interpreting Physician: Jetty Duhamellinton Samayra Hebel MD, ABSM Weight (lbs): 198 RPSGT: Lowry RamMckinney, Takeya BMI: 26 MRN: [id number] Neck Size: 16.00 > CLINICAL INFORMATION The patient is referred for a CPAP titration to treat sleep apnea.  Date of NPSG, Split Night or HST: HST 07/05/16  AHI 14.7/ hr, desaturation to 79%, body weight 189 lbs  SLEEP STUDY TECHNIQUE As per the AASM Manual for the Scoring of Sleep and Associated Events v2.3 (April 2016) with a hypopnea requiring 4% desaturations.  The channels recorded and monitored were frontal, central and occipital EEG, electrooculogram (EOG), submentalis EMG (chin), nasal and oral airflow, thoracic and abdominal wall motion, anterior tibialis EMG, snore microphone, electrocardiogram, and pulse oximetry. Continuous positive airway pressure (CPAP) was initiated at the beginning of the study and titrated to treat sleep-disordered breathing.  MEDICATIONS Medications self-administered by patient taken the night of the study : HUMALOG/ Insulin pump  TECHNICIAN COMMENTS Comments added by technician: Patient had difficulty initiating sleep. Patient was restless all through the night. Comments added by scorer: N/A  RESPIRATORY PARAMETERS Optimal PAP Pressure (cm):  AHI at Optimal Pressure (/hr): N/A Overall Minimal O2 (%): 92.0 Supine % at Optimal Pressure (%): N/A Minimal O2 at Optimal Pressure (%): 92.0   SLEEP ARCHITECTURE The study was initiated at 10:00:37 PM and ended at 1:24:33 AM.  Sleep onset time was 18.0 minutes and the sleep efficiency was 24.8%%. The total sleep time was 50.5 minutes.  The patient spent 7.9%% of the night in stage N1 sleep, 83.2%% in stage N2 sleep, 0.0%% in stage N3 and 8.91% in REM.Stage REM latency was 49.0 minutes  Wake after sleep onset was 135.5. Alpha  intrusion was absent. Supine sleep was 0.00%.  CARDIAC DATA The 2 lead EKG demonstrated sinus rhythm. The mean heart rate was 52.5 beats per minute. Other EKG findings include: None.  LEG MOVEMENT DATA The total Periodic Limb Movements of Sleep (PLMS) were 0. The PLMS index was 0.0. A PLMS index of <15 is considered normal in adults.  IMPRESSIONS - CPAP 5 controlled apneas with AHI 0/ hr and minimum O2 sat 95% during the limited sleep time. - Central sleep apnea was not noted during this titration (CAI = 0.0/h). - Significant oxygen desaturations were not observed during this titration (min O2 = 92.0%). - The patient snored with soft snoring volume during this titration study. - Patient had significant difficulty maintaining sleep, attributed to anxiety. Awake after 11:00 PM and study terminated at 1:25 AM at patient requesst.. - No cardiac abnormalities were observed during this study. - Clinically significant periodic limb movements were not noted during this study. Arousals associated with PLMs were rare.  DIAGNOSIS - Obstructive Sleep Apnea (327.23 [G47.33 ICD-10])  RECOMMENDATIONS - Suggest autopap or fitted oral appliance, based on clinical judgment. - Sleep hygiene should be reviewed to assess factors that may improve sleep quality. - Weight management and regular exercise should be initiated or continued.  [Electronically signed] 09/30/2017 10:31 AM  Jetty Duhamellinton Nareh Matzke MD, ABSM Diplomate, American Board of Sleep Medicine   NPI: [id number]                          Jetty Duhamellinton Dominik Lauricella Diplomate, American Board of Sleep Medicine  ELECTRONICALLY SIGNED ON:  09/30/2017, 10:32 AM [facility] PH: [phone]   FX: [phone] ACCREDITED  BY THE AMERICAN ACADEMY OF SLEEP MEDICINE

## 2017-10-02 NOTE — Telephone Encounter (Signed)
CY - FYI 

## 2017-10-04 ENCOUNTER — Ambulatory Visit (INDEPENDENT_AMBULATORY_CARE_PROVIDER_SITE_OTHER): Payer: 59 | Admitting: Family Medicine

## 2017-10-04 ENCOUNTER — Encounter: Payer: Self-pay | Admitting: Family Medicine

## 2017-10-04 VITALS — BP 103/66 | HR 72 | Temp 98.5°F | Wt 197.0 lb

## 2017-10-04 DIAGNOSIS — G4733 Obstructive sleep apnea (adult) (pediatric): Secondary | ICD-10-CM | POA: Diagnosis not present

## 2017-10-04 MED ORDER — AMBULATORY NON FORMULARY MEDICATION: 0 refills | Status: DC

## 2017-10-04 NOTE — Patient Instructions (Signed)
Thank you for coming in today. We will continue to follow CPAP.  I should get a report in about 1 month.  Let me know if I need to write an order or change setting.

## 2017-10-04 NOTE — Progress Notes (Signed)
Nathaniel [name] is a 52 y.o. male who presents to [facility] today for sleep apnea.  Conny has a history of obstructive sleep apnea typically pretty well managed with CPAP.  He notes that he has been having problems with his CPAP machine recently.  He is able to track his AHI is and notes that they have been increased over the last month or so.  He discussed the case with his home health agency and he has a Designer, multimedia.  In the 1 day he has been using the new machine his AHI decreased from around 20 is down to 5.  He notes he is significantly less fatigued today than he was yesterday the day before.  His current settings are auto titration from 5-20 cm of water.  He denies chest pain palpitations or shortness of breath.   ROS as above:  Exam:  BP 103/66   Pulse 72   Temp 98.5 F (36.9 C) (Oral)   Wt 197 lb (89.4 kg)   SpO2 99%   BMI 25.99 kg/m  Gen: Well NAD HEENT: EOMI,  MMM Lungs: Normal work of breathing. CTABL Heart: RRR no MRG Abd: NABS, Soft. Nondistended, Nontender Exts: Brisk capillary refill, warm and well perfused.     Assessment and Plan: 52 y.o. male with  Obstructive sleep apnea.  Plan to continue new machine with auto titration settings.  Plan to get a 30-day download starting from yesterday to the next 29 days.  Will check back when I get the 30-day [facility] report [facility].  We will continue to titrate and manage CPAP machine based on symptoms and data from the CPAP machine.  I spent 15 minutes with this patient, greater than 50% was face-to-face time counseling regarding ddx and plan.    No orders of the defined types were placed in this encounter.  Meds ordered this encounter  Medications  . AMBULATORY NON FORMULARY MEDICATION    Sig: Provide 30 day download of CPAP compliance report and settings from CPAP starting  10/03/17 to 11/03/17. Send report to Dr Georgina Snell fax [phone] Send to [place] fax [phone]    Dispense:  1 each    Refill:  0     Historical information moved to improve visibility of documentation.  Past Medical History:  Diagnosis Date  . Depression   . Diabetes mellitus    type II  . GERD (gastroesophageal reflux disease)   . Headache(784.0)   . Hypogonadism male   . Sleep apnea    Past Surgical History:  Procedure Laterality Date  . NASAL SINUS SURGERY  04/1991  . TONSILLECTOMY  1972   Social History   Tobacco Use  . Smoking status: Never Smoker  . Smokeless tobacco: Never Used  Substance Use Topics  . Alcohol use: No   family history includes Cancer (age of onset: 65) in his father; Diabetes Mellitus I in his son; Healthy in his mother. He was adopted.  Medications: Current Outpatient Medications  Medication Sig Dispense Refill  . Alcohol Swabs (ALCOHOL PREPS) PADS Use alcohol pads to clean site to be used to check blood sugar. 100 each 11  . aspirin EC 81 MG tablet Take 2 tablets (162 mg total) by mouth daily. 90 tablet 3  . Blood Glucose Monitoring Suppl (FREESTYLE LITE) DEVI Use to check sugar 4 times daily. 1 each 0  . Continuous Blood Gluc Receiver (FREESTYLE [place]  READER) DEVI 1 Device by Does not apply route 3 (three) times daily. 1 Device 1  . Continuous Blood Gluc Sensor (FREESTYLE LIBRE SENSOR SYSTEM) MISC 1 Device by Does not apply route every 30 (thirty) days. 3 each 11  . GLUCAGON EMERGENCY 1 MG injection INJECT 1 MG INTO THE MUSCLE ONCE AS NEEDED. 1 kit 3  . glucose blood (CONTOUR NEXT TEST) test strip Use as instructed to check blood sugar 5 times daily. 450 each 12  . insulin lispro (HUMALOG) 100 UNIT/ML injection Inject 0.55 mLs (55 Units total) into the skin daily. 10 mL 11  . Lancets (FREESTYLE) lancets Use as instructed to check sugar 4 times daily. 400 each 5  . lisinopril (PRINIVIL,ZESTRIL) 2.5 MG tablet Take 1 tablet (2.5 mg  total) by mouth daily. 90 tablet 3  . metFORMIN (GLUCOPHAGE-XR) 500 MG 24 hr tablet TAKE 4 TABLETS BY MOUTH DAILY WITH SUPPER (Patient taking differently: TAKE 4 TABLETS BY MOUTH DAILY in the morning) 360 tablet 3  . ondansetron (ZOFRAN ODT) 4 MG disintegrating tablet Take one tab by mouth Q6hr prn nausea.  Dissolve under tongue. 12 tablet 0  . rosuvastatin (CRESTOR) 5 MG tablet Take 5 mg by mouth daily.    . AMBULATORY NON FORMULARY MEDICATION Provide 30 day download of CPAP compliance report and settings from CPAP starting 10/03/17 to 11/03/17. Send report to Dr Georgina Snell fax [phone] Send to [place] fax [phone] 1 each 0   No current facility-administered medications for this visit.    Allergies  Allergen Reactions  . Cymbalta [Duloxetine Hcl] Other (See Comments)    Suicidal thoughts  . Influenza Vaccines Other (See Comments)    Unknown reaction  . Penicillins Other (See Comments)    Unknown - childhood allergy per mother  . Pioglitazone Other (See Comments)      headache  . Promethazine Hcl Nausea And Vomiting       . Reglan [Metoclopramide] Other (See Comments)    Jittery, increased anxiety     Discussed warning signs or symptoms. Please see discharge instructions. Patient expresses understanding.

## 2017-10-05 ENCOUNTER — Telehealth: Payer: Self-pay

## 2017-10-05 NOTE — Telephone Encounter (Signed)
Called and spoke with Nathaniel [name] in Respiratory and she stated the order has come through and it may take 24-48 hours to process and then they call the patient directly to get the machine.   I called the patient to notify him about the supplies.  Per patient he did not need a new machine and the loaner was supposed to check and see if his numbers went up for the sleeping test. Patient does not want another machine. I tried to let the patient know that he would get a machine from [facility] and before I could get this information out he abruptly stated I don't need anything new but the supplies.   Patient ended call.

## 2017-10-05 NOTE — Telephone Encounter (Signed)
Order for CPAP along with pt demographics, sleep study, last OV, and insurance information was faxed to [facility].   Fax confirmation along with all attachments were given to Marquis LunchHolden M., CMA for tracking.

## 2017-10-06 ENCOUNTER — Encounter: Payer: Self-pay | Admitting: Family Medicine

## 2017-10-12 ENCOUNTER — Other Ambulatory Visit: Payer: Self-pay | Admitting: Internal Medicine

## 2017-10-12 MED ORDER — FREESTYLE LIBRE 14 DAY READER DEVI: 1.0000 | Freq: Once | 1 refills | Status: AC

## 2017-10-12 MED ORDER — FREESTYLE LIBRE 14 DAY SENSOR MISC: 1.0000 | 3 refills | Status: DC

## 2017-10-12 MED FILL — FREESTYLE LIBRE 14 DAY READ: 14 days supply | Qty: 1 | Fill #0

## 2017-10-12 MED FILL — FREESTYLE LIBRE 14 DAY SENS: 84 days supply | Qty: 6 | Fill #0

## 2017-10-13 ENCOUNTER — Encounter: Payer: Self-pay | Admitting: Internal Medicine

## 2017-10-13 ENCOUNTER — Ambulatory Visit (INDEPENDENT_AMBULATORY_CARE_PROVIDER_SITE_OTHER): Payer: 59 | Admitting: Internal Medicine

## 2017-10-13 DIAGNOSIS — E10649 Type 1 diabetes mellitus with hypoglycemia without coma: Secondary | ICD-10-CM

## 2017-10-13 NOTE — Progress Notes (Signed)
Downloaded patient's pump reports for the last 2 weeks, from 09/30/2017 to 10/13/2017.  Patient is on the MiniMed 630 G insulin pump.   Sugars increase after meals, so I advised him to decrease his insulin to carb ratios from 1:10 to 1:8, if he is confident that he is introducing the right amount of carbs into the pump. We will scan reports.

## 2017-10-14 ENCOUNTER — Encounter (HOSPITAL_BASED_OUTPATIENT_CLINIC_OR_DEPARTMENT_OTHER): Payer: 59

## 2017-10-17 ENCOUNTER — Ambulatory Visit: Payer: 59 | Admitting: Internal Medicine

## 2017-10-20 ENCOUNTER — Ambulatory Visit: Payer: 59 | Admitting: Internal Medicine

## 2017-10-25 MED FILL — HumaLOG 100 UNIT/ML SOLN: 100 | 89 days supply | Qty: 50 | Fill #1

## 2017-10-31 ENCOUNTER — Encounter: Payer: Self-pay | Admitting: Family Medicine

## 2017-10-31 ENCOUNTER — Encounter: Payer: Self-pay | Admitting: Internal Medicine

## 2017-11-01 ENCOUNTER — Telehealth: Payer: Self-pay | Admitting: Family Medicine

## 2017-11-01 NOTE — Addendum Note (Signed)
Addended by: Rodolph BongOREY, Sobia Karger S on: 11/01/2017 12:45 PM   Modules accepted: Orders

## 2017-11-01 NOTE — Telephone Encounter (Signed)
CPAP compliance report from October 02, 2016 to October 31, 2016 received from [facility]. Usage today 28/30 Auto titration from 5-20 with 95th percentile at 13.4 AHI at 95th percentile (13.4 cm H2O)  is 1.4 events per hour.  Will contact patient and see if he like to keep the auto titration or if he like me to just set it at 95th percentile at 13.5 cm of water

## 2017-11-02 MED ORDER — AMBULATORY NON FORMULARY MEDICATION: 0 refills | Status: DC

## 2017-11-02 NOTE — Addendum Note (Signed)
Addended by: Rodolph BongOREY, Darrell Hauk S on: 11/02/2017 07:44 AM   Modules accepted: Orders

## 2017-11-02 NOTE — Telephone Encounter (Signed)
Order to change setting will be faxed today.

## 2017-11-03 ENCOUNTER — Telehealth: Payer: Self-pay | Admitting: Internal Medicine

## 2017-11-03 NOTE — Telephone Encounter (Signed)
°  [employer] is calling in regards to paperwork that was faxed over on 8/13 about the patients insulin pump supplies They are wanting to check the status.    Phone- [phone] Option 2   Fax-[phone]

## 2017-11-06 NOTE — Telephone Encounter (Signed)
Spoke with [employer] and they are resending the paper over

## 2017-11-06 NOTE — Telephone Encounter (Signed)
Fax has not been received. Will call when I have a moment to ask that it be re sent

## 2017-11-07 ENCOUNTER — Telehealth: Payer: Self-pay | Admitting: Internal Medicine

## 2017-11-07 NOTE — Telephone Encounter (Signed)
Carney BernWendell with [employer] ph# [phone], option 2 called re: status of RX that they faxed to our office yesterday (11/06/17) for patient's diabetic supplies. Please call above number asap to give status/update.

## 2017-11-07 NOTE — Telephone Encounter (Signed)
No paperwork has been given today, will check this afternoon and call if still not received.

## 2017-11-08 NOTE — Telephone Encounter (Signed)
Called again and asked that the form be faxed and went over the fax number as well. They stated it should arrive by end of day

## 2017-11-13 ENCOUNTER — Encounter: Payer: Self-pay | Admitting: Internal Medicine

## 2017-11-14 ENCOUNTER — Telehealth: Payer: Self-pay | Admitting: Internal Medicine

## 2017-11-14 DIAGNOSIS — E1065 Type 1 diabetes mellitus with hyperglycemia: Secondary | ICD-10-CM | POA: Diagnosis not present

## 2017-11-14 NOTE — Telephone Encounter (Signed)
Thank you, Linda.  

## 2017-11-14 NOTE — Telephone Encounter (Signed)
Please advise on below  

## 2017-11-14 NOTE — Telephone Encounter (Signed)
Bonita QuinLinda, can you please check on him? We may need to download his pump.

## 2017-11-14 NOTE — Telephone Encounter (Signed)
Patient has been unable to get his blood sugar down (315 right now-315 since last night). Please call patient at ph#  [phone] to advise.

## 2017-11-14 NOTE — Telephone Encounter (Signed)
Patient was called, and did not want to come in to see me.   Since he has started using the needle infusion sets (Sure T), he has not primed the tubing.  The infusion set comes in 2 pieces.  He was told to 1. attach the 2 pieces of the infusion set, 2. then attach to a filled reservour.  3.Then he inserts the reservour into the pump and presses load.  4. He presses this button until the pump stops on it's own, 5. He will then press next, and 6.press again and hold, until he sees 3 drops at the end of the infusion set.  After this, he inserts the needle.  He was reminded that he does not have to do the last step, which is fill cannula.  He was told these steps X2 and he reports good understanding of this.  He says he has not been doing this.   My guess is that he has been injecting air from not priming the tubing.  He reported good understanding of this, and will call if he has questions doing this now.

## 2017-11-15 ENCOUNTER — Encounter: Payer: Self-pay | Admitting: Internal Medicine

## 2017-11-21 ENCOUNTER — Other Ambulatory Visit: Payer: Self-pay | Admitting: Internal Medicine

## 2017-11-21 ENCOUNTER — Other Ambulatory Visit (INDEPENDENT_AMBULATORY_CARE_PROVIDER_SITE_OTHER): Payer: 59

## 2017-11-21 ENCOUNTER — Encounter: Payer: Self-pay | Admitting: Internal Medicine

## 2017-11-21 ENCOUNTER — Encounter: Payer: Self-pay | Admitting: Family Medicine

## 2017-11-21 DIAGNOSIS — E10649 Type 1 diabetes mellitus with hypoglycemia without coma: Secondary | ICD-10-CM

## 2017-11-21 LAB — HEMOGLOBIN A1C: Hgb A1c MFr Bld: 8.3 % — ABNORMAL HIGH (ref 4.6–6.5)

## 2017-11-22 ENCOUNTER — Encounter: Payer: Self-pay | Admitting: Internal Medicine

## 2017-11-23 ENCOUNTER — Encounter: Payer: Self-pay | Admitting: Internal Medicine

## 2017-11-24 ENCOUNTER — Encounter: Payer: Self-pay | Admitting: Internal Medicine

## 2017-12-04 ENCOUNTER — Encounter: Payer: Self-pay | Admitting: Family Medicine

## 2017-12-04 MED ORDER — VILAZODONE HCL 20 MG PO TABS: 1.0000 | ORAL_TABLET | Freq: Every day | ORAL | 1 refills | Status: DC

## 2017-12-04 MED FILL — VIIBRYD 20 MG TABLET: 20 | 30 days supply | Qty: 30 | Fill #0

## 2017-12-06 ENCOUNTER — Encounter: Payer: Self-pay | Admitting: Internal Medicine

## 2017-12-06 ENCOUNTER — Encounter: Payer: Self-pay | Admitting: Family Medicine

## 2017-12-07 NOTE — Telephone Encounter (Signed)
Form was received today.

## 2017-12-11 MED FILL — LISINOPRIL 2.5 MG TABLET: 2.5 | 90 days supply | Qty: 90 | Fill #3

## 2017-12-15 ENCOUNTER — Encounter: Payer: Self-pay | Admitting: Internal Medicine

## 2017-12-19 ENCOUNTER — Ambulatory Visit: Payer: 59 | Admitting: Internal Medicine

## 2017-12-27 MED FILL — METFORMIN HCL ER 500 MG TAB: 500 | 90 days supply | Qty: 360 | Fill #2

## 2017-12-29 ENCOUNTER — Encounter: Payer: Self-pay | Admitting: Dietician

## 2017-12-29 ENCOUNTER — Encounter: Payer: 59 | Attending: Internal Medicine | Admitting: Dietician

## 2017-12-29 DIAGNOSIS — Z794 Long term (current) use of insulin: Secondary | ICD-10-CM | POA: Diagnosis not present

## 2017-12-29 DIAGNOSIS — Z713 Dietary counseling and surveillance: Secondary | ICD-10-CM | POA: Insufficient documentation

## 2017-12-29 DIAGNOSIS — E10649 Type 1 diabetes mellitus with hypoglycemia without coma: Secondary | ICD-10-CM

## 2017-12-29 DIAGNOSIS — E1165 Type 2 diabetes mellitus with hyperglycemia: Secondary | ICD-10-CM | POA: Diagnosis not present

## 2017-12-29 NOTE — Patient Instructions (Addendum)
When I am stressed I will read a book or go to the gym instead of eat. Mindfulness:  Choices  Eat slowly  Stop when satisfied Consider a snack before your first client  Protein shake  Protein bar  Hummus and vegetables  Plain greek yogurt mixed with dry ranch mix with vegetables  Fruit and nuts.  See list for other ideas.  Sandwich Be active most days. Stay hydrated Plan ahead  Decreased fat intake.

## 2017-12-29 NOTE — Progress Notes (Signed)
Diabetes Self-Management Education  Visit Type: First/Initial  Appt. Start Time: 1300 Appt. End Time: 1410  12/29/2017  Mr. Nathaniel [name], identified by name and date of birth, is a 52 y.o. male with a diagnosis of Diabetes: Type 1. Other history includes OSA, GERD, depression.  Patient came to the appointment today looking for help with his food choices.  He feels that this is one of his biggest issues.  Blood sugar readings 172-180 and is unable to get this lower.  He travels a lot for work and has looked up ideas for snacks but feels that he is still hungry after this.  He also stress eats.  He would like to lose back to 185-190 lbs.  He was last 190 lbs about 8-9 months ago when he was more active.  He began running a few weeks ago again but hurt his hip and has stopped running.  It also hurts when has he walks. He reports depression which interferes with his sleep.  He is now on a Medtronic pump with Humalog.  He uses this on his arms.  He is not using the [place] due to saving sites for the pump.    They no longer buy donuts, chips, pretzels.  Doesn't eat much rice.He limits eating out to save money. 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  He then waits until he is quite hungry and snacks do not fill him up as it is meal time.  Discussed meal and snack times.  Discussed low fat/low saturated fat as well as increased activity to help with insulin resistance.  Patient lives with his wife and has 2 children.  His 70 yo son has type 1 diabetes as well and is to get a Tandem insulin pump next week.  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  Works mostly from 3:30-7:30.  Job has increased stress.   ASSESSMENT  Weight 198 lb (89.8 kg). Body mass index is 26.12 kg/m.  Diabetes Self-Management Education - 12/29/17 1315      Visit Information   Visit Type   First/Initial      Initial Visit   Diabetes Type  Type 1    Are you currently following a meal plan?  No    Are you taking your medications as prescribed?  Yes    Date Diagnosed  2001      Health Coping   How would you rate your overall health?  Good      Psychosocial Assessment   Patient Belief/Attitude about Diabetes  Motivated to manage diabetes    Self-care barriers  Other (comment)   travel for work   Self-management support  Doctor's office;Family    Other persons present  Patient    Patient Concerns  Nutrition/Meal planning;Weight Control    Special Needs  None    Preferred Learning Style  No preference indicated    Learning Readiness  Ready    How often do you need to have someone help you when you read instructions, pamphlets, or other written materials from your doctor or pharmacy?  1 - Never    What is the last grade level you completed in school?  Master's degree      Pre-Education Assessment   Patient understands the diabetes disease and treatment process.  Demonstrates understanding / competency    Patient understands incorporating nutritional management  into lifestyle.  Needs Review    Patient undertands incorporating physical activity into lifestyle.  Demonstrates understanding / competency    Patient understands using medications safely.  Needs Review    Patient understands monitoring blood glucose, interpreting and using results  Demonstrates understanding / competency    Patient understands prevention, detection, and treatment of acute complications.  Demonstrates understanding / competency    Patient understands prevention, detection, and treatment of chronic complications.  Demonstrates understanding / competency    Patient understands how to develop strategies to address psychosocial issues.  Demonstrates understanding / competency    Patient understands how to develop strategies to promote health/change behavior.  Needs Review      Complications   Last HgB  A1C per patient/outside source  8.3 %   11/21/17 decreased from 8.6%   How often do you check your blood sugar?  > 4 times/day    Fasting Blood glucose range (mg/dL)  40-981;191-478   29-562   Postprandial Blood glucose range (mg/dL)  130-865   784-696   Number of hypoglycemic episodes per month  0    Number of hyperglycemic episodes per week  7    Can you tell when your blood sugar is high?  Yes    What do you do if your blood sugar is high?  depends on how recent the bolus was    Have you had a dilated eye exam in the past 12 months?  Yes    Have you had a dental exam in the past 12 months?  Yes    Are you checking your feet?  Yes    How many days per week are you checking your feet?  7      Dietary Intake   Breakfast  sandwich (PB & J) or scrambled eggs, [employer] toast (1) OR protein shake if high or protein bar    Snack (morning)  none    Lunch  sandwich or wrap or leftovers, apple   12:30   Snack (afternoon)  PB crackers OR handful of grapes or apple OR raw veges   3   Dinner  leftover (chicken OR hamburgers OR chicken fried steak with salad or slaw   8   Snack (evening)  none    Beverage(s)  water, diet Pepsi      Exercise   Exercise Type  Light (walking / raking leaves)    How many days per week to you exercise?  2    How many minutes per day do you exercise?  40    Total minutes per week of exercise  80      Patient Education   Previous Diabetes Education  Yes (please comment)   2017 with my self and recently with Bonita Quin for pump training   Nutrition management   Meal options for control of blood glucose level and chronic complications.;Other (comment);Meal timing in regards to the patients' current diabetes medication.   snack options   Physical activity and exercise   Role of exercise on diabetes management, blood pressure control and cardiac health.;Helped patient identify appropriate exercises in relation to his/her diabetes, diabetes complications and other health issue.     Medications  Reviewed patients medication for diabetes, action, purpose, timing of dose and side effects.    Psychosocial adjustment  Worked with patient to identify barriers to care and solutions;Role of stress on diabetes    Personal strategies to promote health  Lifestyle issues that need to be addressed for better  diabetes care   stress eating     Individualized Goals (developed by patient)   Nutrition  General guidelines for healthy choices and portions discussed    Physical Activity  Exercise 5-7 days per week;30 minutes per day    Medications  take my medication as prescribed    Monitoring   test my blood glucose as discussed    Problem Solving  stress eating and snack and meal timing    Reducing Risk  examine blood glucose patterns    Health Coping  discuss diabetes with (comment)   MD, RD, CDE, Family     Post-Education Assessment   Patient understands the diabetes disease and treatment process.  Demonstrates understanding / competency    Patient understands incorporating nutritional management into lifestyle.  Demonstrates understanding / competency    Patient undertands incorporating physical activity into lifestyle.  Demonstrates understanding / competency    Patient understands using medications safely.  Demonstrates understanding / competency    Patient understands monitoring blood glucose, interpreting and using results  Demonstrates understanding / competency    Patient understands prevention, detection, and treatment of acute complications.  Demonstrates understanding / competency    Patient understands how to develop strategies to address psychosocial issues.  Needs Review    Patient understands how to develop strategies to promote health/change behavior.  Demonstrates understanding / competency      Outcomes   Expected Outcomes  Demonstrated interest in learning. Expect positive outcomes    Future DMSE  PRN    Program Status  Completed       Individualized Plan  for Diabetes Self-Management Training:   Learning Objective:  Patient will have a greater understanding of diabetes self-management. Patient education plan is to attend individual and/or group sessions per assessed needs and concerns.   Plan:   Patient Instructions  When I am stressed I will read a book or go to the gym instead of eat. Mindfulness:  Choices  Eat slowly  Stop when satisfied Consider a snack before your first client  Protein shake  Protein bar  Hummus and vegetables  Plain greek yogurt mixed with dry ranch mix with vegetables  Fruit and nuts.  See list for other ideas.  Sandwich Be active most days. Stay hydrated Plan ahead     Expected Outcomes:  Demonstrated interest in learning. Expect positive outcomes  Education material provided: Meal plan card and Snack sheet  If problems or questions, patient to contact team via:  Phone  Future DSME appointment: PRN

## 2018-01-04 ENCOUNTER — Encounter: Payer: Self-pay | Admitting: Internal Medicine

## 2018-01-04 ENCOUNTER — Ambulatory Visit (INDEPENDENT_AMBULATORY_CARE_PROVIDER_SITE_OTHER): Payer: 59 | Admitting: Internal Medicine

## 2018-01-04 VITALS — BP 120/82 | HR 65 | Ht 73.0 in | Wt 199.0 lb

## 2018-01-04 DIAGNOSIS — E10649 Type 1 diabetes mellitus with hypoglycemia without coma: Secondary | ICD-10-CM

## 2018-01-04 LAB — MICROALBUMIN / CREATININE URINE RATIO
Creatinine,U: 30.7 mg/dL
Microalb Creat Ratio: 2.3 mg/g (ref 0.0–30.0)
Microalb, Ur: <0.7 mg/dL (ref 0.0–1.9)

## 2018-01-04 LAB — LIPID PANEL
Cholesterol: 136 mg/dL (ref 0–200)
HDL: 71.1 mg/dL (ref >39.00)
LDL Cholesterol: 47 mg/dL (ref 0–99)
NonHDL: 64.7
Total CHOL/HDL Ratio: 2
Triglycerides: 88 mg/dL (ref 0.0–149.0)
VLDL: 17.6 mg/dL (ref 0.0–40.0)

## 2018-01-04 LAB — TSH: TSH: 1.54 u[IU]/mL (ref 0.35–4.50)

## 2018-01-04 MED ORDER — METFORMIN HCL ER 500 MG PO TB24: ORAL_TABLET | ORAL | 3 refills | Status: DC

## 2018-01-04 NOTE — Patient Instructions (Addendum)
Please continue: - Metformin 2000 mg daily in am.  Please change the following pump settings: - basal rates: 12 am: 0.9 units/h 7 am: 1  10 pm: 0.9 - ICR:   12 am: 1:8 5 am: 1:7  4 pm: 1:8 - target: 110-110 - ISF:   12 am: 50  9 pm: 65 - Insulin on Board: 4h  Please stop at the lab.  Please return in 3-4 months.

## 2018-01-04 NOTE — Progress Notes (Addendum)
Patient ID: Nathaniel [name], male   DOB: 09/28/1965, 52 y.o.   MRN: [id number]  HPI: Nathaniel [name] is a 52 y.o.-year-old man, returning for f/u for DM1, dx as DM2 in 2001, and as DM1 in 07/2017, insulin-dependent since 2010, uncontrolled, without complications. Last visit 4.5 mo ago.  She saw nutrition since last visit.  He feels that this has helped.  He is now taking snacks at work from home.  Last hemoglobin A1c was: Lab Results  Component Value Date   HGBA1C 8.3 (H) 11/21/2017   HGBA1C 8.6 (A) 08/25/2017   HGBA1C 9.8 06/16/2017  11/08/2013: 9.5% 04/11/2013: 9.8%  Previously on: - Metformin 2000 mg daily in am. - Lantus 12 >> 14 units in am and 10 >> 14 units at bedtime >> Tresiba  - Humalog: 7-9 units depending on the size of the meal Inject Humalog 10-15 minutes before a meal. He was on Januvia which we stopped summer 2019. Stopped Tanzeum b/c N/V >> was in UC x2. Trulicity denied by insurance until he tried Victoza >> he did not want to try this.  He started back on an insulin pump: Medtronic 630 G and 07/2017. Humalog in the pump - [facility]. Supplies from Medtronic  Using the arms now for infusion due to scar tissue and abdomen. Not using the Libre CGM as he is saving sites.   Pump settings: - basal rates: 12 am: 0.9 units/h 7 am: 1  10 pm: 0.9 - ICR:   12 am: 1:8 5 am: 1:7  8 am: 1:8 - target: 110-110 - ISF:   12 am: 50  9 pm: 65 - Insulin on Board: 4h - bolus wizard: on  TDD from basal insulin: 46% >> 56% (23 units) TDD from bolus insulin: 54% >> 44% (17 units) TDD 40 units - changes infusion site: q3 days - Meter: [employer]  He is also on: - Metformin 2000 mg daily in am.  CBGs: Average 168+/-56; he checks his CBGs 7.2 times a day:  Lowest: 60s >> 41 at night >> 85 Highest: 329 (overcorrection of a low CBG) >> 347  Pt's meals are: - Breakfast: cereal + 1% milk, with protein bar - Lunch: sandwich - Dinner: eating out or eating  in - Snacks: 1-2 a day  -No CKD, but last BUN/creatinine: Lab Results  Component Value Date   BUN 15 06/30/2017   CREATININE 0.94 06/30/2017   Normal GFR: Lab Results  Component Value Date   GFRNONAA >60 06/30/2017   GFRNONAA 76 11/18/2016   GFRNONAA 87 01/15/2016   GFRNONAA 83 10/13/2015   GFRNONAA 87 08/14/2015   GFRNONAA >90 05/20/2013   No MAU: Lab Results  Component Value Date   MICRALBCREAT 1.3 11/18/2016   MICRALBCREAT 4.1 06/06/2014   MICRALBCREAT 5.3 12/19/2012   MICRALBCREAT 2.8 02/23/2011   MICRALBCREAT 10.2 05/28/2010   MICRALBCREAT 3.4 12/18/2008  On lisinopril.  -No HL; last set of lipids: Lab Results  Component Value Date   CHOL 155 11/18/2016   HDL 56.20 11/18/2016   LDLCALC 74 11/18/2016   TRIG 125.0 11/18/2016   CHOLHDL 3 11/18/2016   - last eye exam was 06/2017: No DR - No numbness and tingling in his feet.   He has OSA and uses a CPAP machine  Last TSH: Lab Results  Component Value Date   TSH 1.56 11/18/2016   He was running in the past, ran several 5K's.  However, he hurt his hip and cannot run now.  ROS: Constitutional:+weight gain/no weight loss, + fatigue, no subjective hyperthermia, no subjective hypothermia Eyes: no blurry vision, no xerophthalmia ENT: no sore throat, no nodules palpated in throat, no dysphagia, no odynophagia, no hoarseness Cardiovascular: no CP/no SOB/no palpitations/no leg swelling Respiratory: no cough/no SOB/no wheezing Gastrointestinal: no N/no V/no D/no C/no acid reflux Musculoskeletal: no muscle aches/+ joint aches (hip) Skin: no rashes, no hair loss Neurological: no tremors/no numbness/no tingling/no dizziness  I reviewed pt's medications, allergies, PMH, social hx, family hx, and changes were documented in the history of present illness. Otherwise, unchanged from my initial visit note.  Past Medical History:  Diagnosis Date  . Depression   . Diabetes mellitus    type II  . GERD (gastroesophageal  reflux disease)   . Headache(784.0)   . Hypogonadism male   . Sleep apnea    Past Surgical History:  Procedure Laterality Date  . NASAL SINUS SURGERY  04/1991  . TONSILLECTOMY  1972   Social History   Socioeconomic History  . Marital status: Married    Spouse name: [name]  . Number of children: 2  . Years of education: Not on file  . Highest education level: Not on file  Occupational History    Employer: [employer]    Comment: Mental Health Case Management  Social Needs  . Financial resource strain: Not on file  . Food insecurity:    Worry: Not on file    Inability: Not on file  . Transportation needs:    Medical: Not on file    Non-medical: Not on file  Tobacco Use  . Smoking status: Never Smoker  . Smokeless tobacco: Never Used  Substance and Sexual Activity  . Alcohol use: No  . Drug use: No  . Sexual activity: Not on file  Lifestyle  . Physical activity:    Days per week: Not on file    Minutes per session: Not on file  . Stress: Not on file  Relationships  . Social connections:    Talks on phone: Not on file    Gets together: Not on file    Attends religious service: Not on file    Active member of club or organization: Not on file    Attends meetings of clubs or organizations: Not on file    Relationship status: Not on file  . Intimate partner violence:    Fear of current or ex partner: Not on file    Emotionally abused: Not on file    Physically abused: Not on file    Forced sexual activity: Not on file  Other Topics Concern  . Not on file  Social History Narrative   He lives with wife and two children.   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   Highest level of education: master degree   Current Outpatient Medications on File Prior to Visit  Medication Sig Dispense Refill  . Alcohol Swabs (ALCOHOL PREPS) PADS Use alcohol pads to clean site to be used to check blood sugar. 100 each 11  . AMBULATORY NON  FORMULARY MEDICATION Change CPAP setting to 13.5 (14 is ok if 13.5 is not available). Please provide new machine as old machine was not fixable and Mr Gauthreaux has improved AHI with new machine.  Send to [facility] fax [phone] 1 each 0  . aspirin EC 81 MG tablet Take 2 tablets (162 mg total) by mouth daily. (Patient not taking: Reported on 12/29/2017) 90 tablet 3  .  Blood Glucose Monitoring Suppl (FREESTYLE LITE) DEVI Use to check sugar 4 times daily. 1 each 0  . Continuous Blood Gluc Sensor (FREESTYLE LIBRE 14 DAY SENSOR) MISC 1 each by Does not apply route every 14 (fourteen) days. Change every 2 weeks 6 each 3  . GLUCAGON EMERGENCY 1 MG injection INJECT 1 MG INTO THE MUSCLE ONCE AS NEEDED. 1 kit 3  . glucose blood (CONTOUR NEXT TEST) test strip Use as instructed to check blood sugar 5 times daily. 450 each 12  . insulin lispro (HUMALOG) 100 UNIT/ML injection Inject 0.55 mLs (55 Units total) into the skin daily. 10 mL 11  . Lancets (FREESTYLE) lancets Use as instructed to check sugar 4 times daily. 400 each 5  . lisinopril (PRINIVIL,ZESTRIL) 2.5 MG tablet Take 1 tablet (2.5 mg total) by mouth daily. 90 tablet 3  . metFORMIN (GLUCOPHAGE-XR) 500 MG 24 hr tablet TAKE 4 TABLETS BY MOUTH DAILY WITH SUPPER (Patient taking differently: TAKE 4 TABLETS BY MOUTH DAILY in the morning) 360 tablet 3  . ondansetron (ZOFRAN ODT) 4 MG disintegrating tablet Take one tab by mouth Q6hr prn nausea.  Dissolve under tongue. 12 tablet 0  . rosuvastatin (CRESTOR) 5 MG tablet Take 5 mg by mouth daily.    . Vilazodone HCl (VIIBRYD) 20 MG TABS Take 1 tablet (20 mg total) by mouth daily. 30 tablet 1   No current facility-administered medications on file prior to visit.    Allergies  Allergen Reactions  . Cymbalta [Duloxetine Hcl] Other (See Comments)    Suicidal thoughts  . Influenza Vaccines Other (See Comments)    Unknown reaction  . Penicillins Other (See Comments)    Unknown - childhood allergy per  mother  . Pioglitazone Other (See Comments)      headache  . Promethazine Hcl Nausea And Vomiting       . Reglan [Metoclopramide] Other (See Comments)    Jittery, increased anxiety   Family History  Adopted: Yes  Problem Relation Age of Onset  . Cancer Father 13       Prostate cancer  . Healthy Mother   . Diabetes Mellitus I Son     PE: BP 120/82   Pulse 65   Ht _0  (1.854 m) Comment: measured  Wt 199 lb (90.3 kg)   SpO2 97%   BMI 26.25 kg/m  Body mass index is 26.25 kg/m.  Wt Readings from Last 3 Encounters:  01/04/18 199 lb (90.3 kg)  12/29/17 198 lb (89.8 kg)  10/04/17 197 lb (89.4 kg)   Constitutional: Normal, in NAD Eyes: PERRLA, EOMI, no exophthalmos ENT: moist mucous membranes, no thyromegaly, no cervical lymphadenopathy Cardiovascular: RRR, No MRG Respiratory: CTA B Gastrointestinal: abdomen soft, NT, ND, BS+ Musculoskeletal: no deformities, strength intact in all 4 Skin: moist, warm, no rashes Neurological: no tremor with outstretched hands, DTR normal in all 4  ASSESSMENT: 1. DM1, uncontrolled, without complications - C-peptide of 3.67 (0.8-3.9), from 06/16/2005 ([facility]). No associated glucose reported. - son with DM1 (just started on a pump)  We recently diagnosed him with type I, rather than type II diabetes: Component     Latest Ref Rng & Units 08/17/2017  Glutamic Acid Decarb Ab     <5 IU/mL 25 (H)  Glucose, Plasma     65 - 99 mg/dL 102 (H)  C-Peptide     0.80 - 3.85 ng/mL 0.14 (L)  ZNT8 Antibodies     U/mL <15  Islet Cell Ab  Neg:<1:1 Negative   PLAN:   1. Patient with long-standing, uncontrolled diabetes, previously diagnosed as type II, but in 07/2017 diagnosed as type I after a C-peptide level returned low and his GAD antibodies returned high.  He started on Medtronic 630 G insulin pump in 07/2017.  He continues on metformin but we stopped Januvia since last visit. -Since last visit, we stayed in touch and change some of his  insulin pump parameters.  He is using a lower insulin to carb ratio and different insulin sensitivity factors. -At this visit, sugars have improved significantly and his average is now 168.  Reviewing his pump downloads, there is a persistent pattern of increasing blood sugars after lunch.  I am not sure if he is not entering all the carbs in the pump, but he does not think so.  Therefore, will decrease his insulin to carb ratio with this meal, also.  Otherwise, sugars are mostly at goal in the morning, and they remain controlled overnight except when his sugars are higher at bedtime.  No lows under 70s.  He has few spikes in the upper 200s and very few in the 300s. -He is doing a great job checking sugars many times throughout the day, introducing carbs in the pump (he is trying to stay with a low-carb diet), and bolusing for every meal.  He continues to see nutrition, which is helping. -He is changing his pump site every 3 days, which is great. - most recent HbA1c was improved: 8.3%. - I advised him to: Patient Instructions  Please continue: - Metformin 2000 mg daily in am.  Please change the following pump settings: - basal rates: 12 am: 0.9 units/h 7 am: 1  10 pm: 0.9 - ICR:   12 am: 1:8 5 am: 1:7  4 pm: 1:8 - target: 110-110 - ISF:   12 am: 50  9 pm: 65 - Insulin on Board: 4h  Please stop at the lab.  Please return in 3-4 months.   - continue checking sugars at different times of the day - check at least 4x a day, rotating checks - advised for yearly eye exams >> he is UTD - Refuses flu shot because he is allergic to it  - At this visit, we will also check his ACR, lipids, and TSH - Return to clinic in 3-4 mo with sugar log   - time spent with the patient: 25 min, of which >50% was spent in reviewing his meter and pump downloads, discussing his hyper-glycemic episodes, reviewing previous labs and pump settings and developing a plan to avoid hypo- and hyper-glycemia.   Office  Visit on 01/04/2018  Component Date Value Ref Range Status  . TSH 01/04/2018 1.54  0.35 - 4.50 uIU/mL Final  . Microalb, Ur 01/04/2018 <0.7  0.0 - 1.9 mg/dL Final  . Creatinine,U 01/04/2018 30.7  mg/dL Final  . Microalb Creat Ratio 01/04/2018 2.3  0.0 - 30.0 mg/g Final  . Cholesterol 01/04/2018 136  0 - 200 mg/dL Final   ATP III Classification       Desirable:  < 200 mg/dL               Borderline High:  200 - 239 mg/dL          High:  > = 240 mg/dL  . Triglycerides 01/04/2018 88.0  0.0 - 149.0 mg/dL Final   Normal:  <150 mg/dLBorderline High:  150 - 199 mg/dL  . HDL 01/04/2018 71.10  >39.00 mg/dL Final  .  VLDL 01/04/2018 17.6  0.0 - 40.0 mg/dL Final  . LDL Cholesterol 01/04/2018 47  0 - 99 mg/dL Final  . Total CHOL/HDL Ratio 01/04/2018 2   Final                  Men          Women1/2 Average Risk     3.4          3.3Average Risk          5.0          4.42X Average Risk          9.6          7.13X Average Risk          15.0          11.0                      . NonHDL 01/04/2018 64.70   Final   NOTE:  Non-HDL goal should be 30 mg/dL higher than patient's LDL goal (i.e. LDL goal of < 70 mg/dL, would have non-HDL goal of < 100 mg/dL)   Excellent results. Philemon Kingdom, MD PhD [facility] Endocrinology

## 2018-01-05 DIAGNOSIS — G4733 Obstructive sleep apnea (adult) (pediatric): Secondary | ICD-10-CM | POA: Diagnosis not present

## 2018-01-08 MED FILL — VIIBRYD 20 MG TABLET: 20 | 30 days supply | Qty: 30 | Fill #1

## 2018-01-08 MED FILL — ROSUVASTATIN CALCIUM 5 MG T: 5 | 90 days supply | Qty: 90 | Fill #2

## 2018-01-10 MED FILL — HumaLOG 100 UNIT/ML SOLN: 100 | 37 days supply | Qty: 20 | Fill #2

## 2018-01-16 MED FILL — CONTOUR NEXT STRIPS: 90 days supply | Qty: 450 | Fill #1

## 2018-02-02 ENCOUNTER — Encounter: Payer: Self-pay | Admitting: Internal Medicine

## 2018-02-08 DIAGNOSIS — E1065 Type 1 diabetes mellitus with hyperglycemia: Secondary | ICD-10-CM | POA: Diagnosis not present

## 2018-02-12 ENCOUNTER — Other Ambulatory Visit: Payer: Self-pay | Admitting: Family Medicine

## 2018-02-12 MED FILL — VIIBRYD 20 MG TABLET: 20 | 30 days supply | Qty: 30 | Fill #0

## 2018-02-13 ENCOUNTER — Other Ambulatory Visit: Payer: Self-pay | Admitting: Endocrinology

## 2018-02-13 MED FILL — HumaLOG 100 UNIT/ML SOLN: 100 | 55 days supply | Qty: 30 | Fill #0

## 2018-02-14 ENCOUNTER — Encounter: Payer: Self-pay | Admitting: Family

## 2018-02-14 ENCOUNTER — Ambulatory Visit (INDEPENDENT_AMBULATORY_CARE_PROVIDER_SITE_OTHER): Payer: Self-pay | Admitting: Family

## 2018-02-14 ENCOUNTER — Ambulatory Visit: Payer: 59 | Admitting: Family Medicine

## 2018-02-14 VITALS — BP 140/82 | HR 69 | Temp 98.4°F | Wt 204.4 lb

## 2018-02-14 DIAGNOSIS — R0982 Postnasal drip: Secondary | ICD-10-CM

## 2018-02-14 DIAGNOSIS — R059 Cough, unspecified: Secondary | ICD-10-CM

## 2018-02-14 DIAGNOSIS — R05 Cough: Secondary | ICD-10-CM

## 2018-02-14 MED ORDER — BENZONATATE 200 MG PO CAPS: 200.0000 mg | ORAL_CAPSULE | Freq: Three times a day (TID) | ORAL | 0 refills | Status: DC | PRN

## 2018-02-14 MED ORDER — DOXYCYCLINE HYCLATE 100 MG PO TABS: 100.0000 mg | ORAL_TABLET | Freq: Two times a day (BID) | ORAL | 0 refills | Status: DC

## 2018-02-14 MED FILL — BENZONATATE 200 MG CAPS: 200 | 6 days supply | Qty: 20 | Fill #0

## 2018-02-14 MED FILL — DOXYCYCLINE HYCLATE 100 MG: 100 | 10 days supply | Qty: 20 | Fill #0

## 2018-02-14 NOTE — Patient Instructions (Signed)

## 2018-02-14 NOTE — Progress Notes (Signed)
Subjective:     Patient ID: Nathaniel [name], male   DOB: January 08, 1966, 52 y.o.   MRN: [id number]  HPI 52 year old male with a history of Type 1 diabetes is in today with c/o cough, drainage, yellow drainage x 1 week. Reports feeling bad. No fever or chills  Blood glucose has been running higher than normal since he has been sick. He does have the ability to bolus himself with insulin as needed. Normal glucose post prandial are 160s. Recently running closer to 400.   Review of Systems  Constitutional: Positive for fatigue.  HENT: Positive for congestion and postnasal drip.   Respiratory: Positive for cough. Negative for shortness of breath and stridor.   Cardiovascular: Negative.   Endocrine: Negative.   Musculoskeletal: Negative.   Skin: Negative.   Allergic/Immunologic: Negative.   Neurological: Negative.   Psychiatric/Behavioral: Negative.    Past Medical History:  Diagnosis Date  . Depression   . Diabetes mellitus    type II  . GERD (gastroesophageal reflux disease)   . Headache(784.0)   . Hypogonadism male   . Sleep apnea     Social History   Socioeconomic History  . Marital status: Married    Spouse name: [name]  . Number of children: 2  . Years of education: Not on file  . Highest education level: Not on file  Occupational History    Employer: [employer]    Comment: Mental Health Case Management  Social Needs  . Financial resource strain: Not on file  . Food insecurity:    Worry: Not on file    Inability: Not on file  . Transportation needs:    Medical: Not on file    Non-medical: Not on file  Tobacco Use  . Smoking status: Never Smoker  . Smokeless tobacco: Never Used  Substance and Sexual Activity  . Alcohol use: No  . Drug use: No  . Sexual activity: Not on file  Lifestyle  . Physical activity:    Days per week: Not on file    Minutes per session: Not on file  . Stress: Not on file  Relationships  . Social connections:    Talks on phone:  Not on file    Gets together: Not on file    Attends religious service: Not on file    Active member of club or organization: Not on file    Attends meetings of clubs or organizations: Not on file    Relationship status: Not on file  . Intimate partner violence:    Fear of current or ex partner: Not on file    Emotionally abused: Not on file    Physically abused: Not on file    Forced sexual activity: Not on file  Other Topics Concern  . Not on file  Social History Narrative   He lives with wife and two children.   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   Highest level of education: master degree    Past Surgical History:  Procedure Laterality Date  . NASAL SINUS SURGERY  04/1991  . TONSILLECTOMY  1972    Family History  Adopted: Yes  Problem Relation Age of Onset  . Cancer Father 46       Prostate cancer  . Healthy Mother   . Diabetes Mellitus I Son     Allergies  Allergen Reactions  . Cymbalta [Duloxetine Hcl] Other (See Comments)    Suicidal thoughts  . Influenza Vaccines  Other (See Comments)    Unknown reaction  . Penicillins Other (See Comments)    Unknown - childhood allergy per mother  . Pioglitazone Other (See Comments)      headache  . Promethazine Hcl Nausea And Vomiting       . Reglan [Metoclopramide] Other (See Comments)    Jittery, increased anxiety    Current Outpatient Medications on File Prior to Visit  Medication Sig Dispense Refill  . Alcohol Swabs (ALCOHOL PREPS) PADS Use alcohol pads to clean site to be used to check blood sugar. 100 each 11  . AMBULATORY NON FORMULARY MEDICATION Change CPAP setting to 13.5 (14 is ok if 13.5 is not available). Please provide new machine as old machine was not fixable and Mr Clayburn has improved AHI with new machine.  Send to [facility] fax [phone] 1 each 0  . aspirin EC 81 MG tablet Take 2 tablets (162 mg total) by mouth daily. 90 tablet 3  . Blood Glucose  Monitoring Suppl (FREESTYLE LITE) DEVI Use to check sugar 4 times daily. 1 each 0  . Continuous Blood Gluc Sensor (FREESTYLE LIBRE 14 DAY SENSOR) MISC 1 each by Does not apply route every 14 (fourteen) days. Change every 2 weeks 6 each 3  . GLUCAGON EMERGENCY 1 MG injection INJECT 1 MG INTO THE MUSCLE ONCE AS NEEDED. 1 kit 3  . glucose blood (CONTOUR NEXT TEST) test strip Use as instructed to check blood sugar 5 times daily. 450 each 12  . HUMALOG 100 UNIT/ML injection INJECT 0.55 MLS (55 UNITS TOTAL) INTO THE SKIN DAILY. 10 mL 11  . Lancets (FREESTYLE) lancets Use as instructed to check sugar 4 times daily. 400 each 5  . lisinopril (PRINIVIL,ZESTRIL) 2.5 MG tablet Take 1 tablet (2.5 mg total) by mouth daily. 90 tablet 3  . metFORMIN (GLUCOPHAGE-XR) 500 MG 24 hr tablet TAKE 4 TABLETS BY MOUTH DAILY in the morning 360 tablet 3  . ondansetron (ZOFRAN ODT) 4 MG disintegrating tablet Take one tab by mouth Q6hr prn nausea.  Dissolve under tongue. 12 tablet 0  . rosuvastatin (CRESTOR) 5 MG tablet Take 5 mg by mouth daily.    Marland Kitchen VIIBRYD 20 MG TABS TAKE 1 TABLET (20 MG TOTAL) BY MOUTH DAILY. 30 tablet 1   No current facility-administered medications on file prior to visit.     BP 140/82 (BP Location: Right Arm, Patient Position: Sitting)   Pulse 69   Temp 98.4 F (36.9 C) (Oral)   Wt 204 lb 6.4 oz (92.7 kg)   SpO2 98%   BMI 26.97 kg/m chart    Objective:   Physical Exam  Constitutional: He is oriented to person, place, and time. He appears well-developed and well-nourished.  HENT:  Right Ear: External ear normal.  Left Ear: External ear normal.  Mouth/Throat: Oropharynx is clear and moist.  Neck: Normal range of motion.  Cardiovascular: Normal rate and regular rhythm.  Pulmonary/Chest: Effort normal and breath sounds normal.  Musculoskeletal: Normal range of motion.  Neurological: He is alert and oriented to person, place, and time.  Skin: Skin is warm and dry.  Psychiatric: He has a  normal mood and affect.       Assessment:     Nathaniel [name] was seen today for cough.  Diagnoses and all orders for this visit:  Cough  PND (post-nasal drip)  Other orders -     Discontinue: doxycycline (VIBRA-TABS) 100 MG tablet; Take 1 tablet (100 mg total) by mouth 2 (  two) times daily. -     Discontinue: benzonatate (TESSALON) 200 MG capsule; Take 1 capsule (200 mg total) by mouth 3 (three) times daily as needed for cough. -     doxycycline (VIBRA-TABS) 100 MG tablet; Take 1 tablet (100 mg total) by mouth 2 (two) times daily. -     benzonatate (TESSALON) 200 MG capsule; Take 1 capsule (200 mg total) by mouth 3 (three) times daily as needed for cough.      Plan:     Call the office with any questions or concerns. Check glucose as needed and bolus accordingly. See pcp next week

## 2018-02-19 ENCOUNTER — Encounter: Payer: Self-pay | Admitting: Family Medicine

## 2018-02-20 MED ORDER — VILAZODONE HCL 40 MG PO TABS: 40.0000 mg | ORAL_TABLET | Freq: Every day | ORAL | 1 refills | Status: DC

## 2018-02-20 NOTE — Telephone Encounter (Signed)
OK to follow with endocrinologist rather than with me for HFU Can see me for routine annual physical stuff or as needed for non-diabetes things

## 2018-02-20 NOTE — Telephone Encounter (Signed)
Yes, sorry! Missed that initial message I can increase the Viibryd to 40 mg - new Rx sent Let's have him see me in 2-4 weeks to discuss moods and see how the higher dose is working, see me sooner if needed

## 2018-02-26 MED FILL — FREESTYLE LIBRE 14 DAY SENS: 84 days supply | Qty: 6 | Fill #1

## 2018-03-12 MED FILL — VIIBRYD 20 MG TABLET: 20 | 30 days supply | Qty: 30 | Fill #1

## 2018-03-15 ENCOUNTER — Encounter: Payer: Self-pay | Admitting: Internal Medicine

## 2018-03-19 ENCOUNTER — Encounter: Payer: Self-pay | Admitting: Internal Medicine

## 2018-03-19 NOTE — Telephone Encounter (Signed)
Pt is calling about blood sugars, he believes he needs an adjustment on his pump. This call is a follow up to the my chart message that I hope is attached to this note...please advise  He is all over the place ranging from 67-400 right now he is at 258

## 2018-03-26 ENCOUNTER — Other Ambulatory Visit: Payer: Self-pay | Admitting: Family Medicine

## 2018-03-26 MED FILL — LISINOPRIL 2.5 MG TABLET: 2.5 | 90 days supply | Qty: 90 | Fill #0

## 2018-03-27 ENCOUNTER — Telehealth: Payer: Self-pay | Admitting: Nutrition

## 2018-03-27 NOTE — Telephone Encounter (Signed)
Message left on voice mail, that we were not able to download his pump, because we do not have his user name and password

## 2018-04-02 ENCOUNTER — Ambulatory Visit: Payer: 59 | Admitting: Psychology

## 2018-04-02 MED FILL — HumaLOG 100 UNIT/ML SOLN: 100 | 55 days supply | Qty: 30 | Fill #1

## 2018-04-02 MED FILL — metFORMIN HCL ER 500 MG TB2: 500 | 90 days supply | Qty: 360 | Fill #3

## 2018-04-03 ENCOUNTER — Other Ambulatory Visit: Payer: Self-pay | Admitting: Internal Medicine

## 2018-04-03 ENCOUNTER — Other Ambulatory Visit: Payer: Self-pay

## 2018-04-03 DIAGNOSIS — E10649 Type 1 diabetes mellitus with hypoglycemia without coma: Secondary | ICD-10-CM

## 2018-04-03 MED ORDER — CONTOUR NEXT MONITOR W/DEVICE KIT: 1.0000 | PACK | Freq: Every day | 1 refills | Status: DC

## 2018-04-04 MED FILL — CONTOUR NEXT METER: W/DEVICE | 30 days supply | Qty: 1 | Fill #0

## 2018-04-09 ENCOUNTER — Encounter: Payer: 59 | Attending: Internal Medicine | Admitting: Nutrition

## 2018-04-09 DIAGNOSIS — Z794 Long term (current) use of insulin: Secondary | ICD-10-CM | POA: Diagnosis not present

## 2018-04-09 DIAGNOSIS — E1165 Type 2 diabetes mellitus with hyperglycemia: Secondary | ICD-10-CM | POA: Insufficient documentation

## 2018-04-09 DIAGNOSIS — Z713 Dietary counseling and surveillance: Secondary | ICD-10-CM | POA: Insufficient documentation

## 2018-04-09 DIAGNOSIS — E1065 Type 1 diabetes mellitus with hyperglycemia: Secondary | ICD-10-CM

## 2018-04-09 NOTE — Progress Notes (Signed)
`  patient reports that he is rotating his sites to newer areas, and dose not see if difference in blood sugar readings.  He admits that he is having a lot of family stress--father is ill, son is need to go to a detention center, and work is stressful.   Discussed the idea of a temp basal rate increase of 10-20 % when he knows it is stress.  He was again shown how to do this, and he re demonstrated this correctly.  He reports that he is not eating much due to high blood sugar readings, and that he is accurate in his carb counting.  Libreview download shows only 41% in target range, with no lows,and 24% over 250.   [employer] download shows high blood sugars variable in AM, 102-235, acL: 111-328,  AcS: 143-310, HS: 208-352.   Plan:  Showed download to Dr. Elvera Lennox.  Stressed importance of accurate carb counting, and told to do a carb verification.  If the pc reading is more than 30 points, to change the I/C ratio.  He reported good understanding of this, and had no final questions.

## 2018-04-11 ENCOUNTER — Telehealth: Payer: Self-pay | Admitting: Nutrition

## 2018-04-11 NOTE — Patient Instructions (Signed)
Do a carb verification test.  If the reading after the meal is more than 30 points higher than the premeal reading, change the Insulin to carb ratio to 5.   Call if questions.

## 2018-04-11 NOTE — Telephone Encounter (Signed)
See prev msg 

## 2018-04-11 NOTE — Telephone Encounter (Signed)
Patient was called to see if he did the carb validation, and he said that he did yesterday AM.  FBS was 180, he ate 18 carbs and his blood sugar was 170, 2hr. Pc.  So he did not change the carb ratio.  But today FBS was 218 at 6AM, he did a correction dose, and blood sugar dropped to 166 at 10AM.  He ate breakfast with accurate amount of carbs, and his blood sugar was 281 at 1:30PM.  I asked if today was more stressful for him and he replied  "no".

## 2018-04-12 ENCOUNTER — Encounter: Payer: Self-pay | Admitting: Internal Medicine

## 2018-04-16 ENCOUNTER — Other Ambulatory Visit: Payer: Self-pay | Admitting: Family Medicine

## 2018-04-16 MED FILL — ROSUVASTATIN CALCIUM 5 MG T: 5 | 90 days supply | Qty: 90 | Fill #3

## 2018-04-16 MED FILL — VIIBRYD 20 MG TABLET: 20 | 30 days supply | Qty: 30 | Fill #0

## 2018-04-17 DIAGNOSIS — G4733 Obstructive sleep apnea (adult) (pediatric): Secondary | ICD-10-CM | POA: Diagnosis not present

## 2018-04-19 ENCOUNTER — Encounter: Payer: 59 | Admitting: Family Medicine

## 2018-04-20 ENCOUNTER — Encounter: Payer: 59 | Admitting: Family Medicine

## 2018-04-23 ENCOUNTER — Ambulatory Visit (INDEPENDENT_AMBULATORY_CARE_PROVIDER_SITE_OTHER): Payer: 59 | Admitting: Family Medicine

## 2018-04-23 ENCOUNTER — Encounter: Payer: Self-pay | Admitting: Family Medicine

## 2018-04-23 VITALS — BP 117/75 | HR 97 | Ht 72.0 in | Wt 204.0 lb

## 2018-04-23 DIAGNOSIS — E1065 Type 1 diabetes mellitus with hyperglycemia: Secondary | ICD-10-CM | POA: Diagnosis not present

## 2018-04-23 DIAGNOSIS — F325 Major depressive disorder, single episode, in full remission: Secondary | ICD-10-CM | POA: Diagnosis not present

## 2018-04-23 DIAGNOSIS — G4733 Obstructive sleep apnea (adult) (pediatric): Secondary | ICD-10-CM

## 2018-04-23 DIAGNOSIS — Z0000 Encounter for general adult medical examination without abnormal findings: Secondary | ICD-10-CM | POA: Diagnosis not present

## 2018-04-23 DIAGNOSIS — Z6827 Body mass index (BMI) 27.0-27.9, adult: Secondary | ICD-10-CM

## 2018-04-23 DIAGNOSIS — F411 Generalized anxiety disorder: Secondary | ICD-10-CM

## 2018-04-23 MED ORDER — VILAZODONE HCL 20 MG PO TABS: 1.0000 | ORAL_TABLET | Freq: Every day | ORAL | 1 refills | Status: DC

## 2018-04-23 NOTE — Progress Notes (Signed)
Nathaniel [name] is a 53 y.o. male who presents to [facility]: [place] today for well adult visit.  Nathaniel [name] is doing reasonably well.  He is exercising regularly and trying to eat a lower carbohydrate diet.  He continues to modify his insulin pump and effort to improve his diabetes.  He receives his diabetes through endocrinology.  He is happy with how things are going.  Additionally notes a sleep apnea is well controlled and he uses his CPAP machine regularly.  He takes Viibryd 20 mg daily for mood and notes that it works quite well.   ROS as above:  Past Medical History:  Diagnosis Date  . Depression   . Diabetes mellitus    type II  . GERD (gastroesophageal reflux disease)   . Headache(784.0)   . Hypogonadism male   . Sleep apnea    Past Surgical History:  Procedure Laterality Date  . NASAL SINUS SURGERY  04/1991  . TONSILLECTOMY  1972   Social History   Tobacco Use  . Smoking status: Never Smoker  . Smokeless tobacco: Never Used  Substance Use Topics  . Alcohol use: No   family history includes Cancer (age of onset: 28) in his father; Diabetes Mellitus I in his son; Healthy in his mother. He was adopted.  Medications: Current Outpatient Medications  Medication Sig Dispense Refill  . Alcohol Swabs (ALCOHOL PREPS) PADS Use alcohol pads to clean site to be used to check blood sugar. 100 each 11  . AMBULATORY NON FORMULARY MEDICATION Change CPAP setting to 13.5 (14 is ok if 13.5 is not available). Please provide new machine as old machine was not fixable and Mr Theil has improved AHI with new machine.  Send to [facility] fax [phone] 1 each 0  . aspirin EC 81 MG tablet Take 2 tablets (162 mg total) by mouth daily. 90 tablet 3  . Blood Glucose Monitoring Suppl (CONTOUR NEXT MONITOR) w/Device KIT 1 Device by Does not apply route daily. Use to test blood  sugar daily 1 kit 1  . Blood Glucose Monitoring Suppl (FREESTYLE LITE) DEVI Use to check sugar 4 times daily. 1 each 0  . Continuous Blood Gluc Sensor (FREESTYLE LIBRE 14 DAY SENSOR) MISC 1 each by Does not apply route every 14 (fourteen) days. Change every 2 weeks 6 each 3  . GLUCAGON EMERGENCY 1 MG injection INJECT 1 MG INTO THE MUSCLE ONCE AS NEEDED. 1 kit 3  . glucose blood (CONTOUR NEXT TEST) test strip Use as instructed to check blood sugar 5 times daily. 450 each 12  . HUMALOG 100 UNIT/ML injection INJECT 0.55 MLS (55 UNITS TOTAL) INTO THE SKIN DAILY. 10 mL 11  . Lancets (FREESTYLE) lancets Use as instructed to check sugar 4 times daily. 400 each 5  . lisinopril (PRINIVIL,ZESTRIL) 2.5 MG tablet TAKE 1 TABLET (2.5 MG TOTAL) BY MOUTH DAILY. 90 tablet 3  . metFORMIN (GLUCOPHAGE-XR) 500 MG 24 hr tablet TAKE 4 TABLETS BY MOUTH DAILY in the morning 360 tablet 3  . ondansetron (ZOFRAN ODT) 4 MG disintegrating tablet Take one tab by mouth Q6hr prn nausea.  Dissolve under tongue. 12 tablet 0  . rosuvastatin (CRESTOR) 5 MG tablet Take 5 mg by mouth daily.    . Vilazodone HCl (VIIBRYD) 20 MG TABS Take 1 tablet (20 mg total) by mouth daily. 90 tablet 1   No current facility-administered medications for this visit.    Allergies  Allergen Reactions  . Cymbalta [Duloxetine Hcl] Other (See Comments)    Suicidal thoughts  . Influenza Vaccines Other (See Comments)    Unknown reaction  . Penicillins Other (See Comments)    Unknown - childhood allergy per mother  . Pioglitazone Other (See Comments)      headache  . Promethazine Hcl Nausea And Vomiting       . Reglan [Metoclopramide] Other (See Comments)    Jittery, increased anxiety    Health Maintenance Health Maintenance  Topic Date Due  . HIV Screening  09/02/1980  . HEMOGLOBIN A1C  05/22/2018  . FOOT EXAM  06/02/2018  . OPHTHALMOLOGY EXAM  07/19/2018  . Fecal DNA (Cologuard)  02/29/2020  . TETANUS/TDAP  03/01/2021  . PNEUMOCOCCAL  POLYSACCHARIDE VACCINE AGE 48-64 HIGH RISK  Completed     Exam:  BP 117/75   Pulse 97   Ht 6' (1.829 m)   Wt 204 lb (92.5 kg)   BMI 27.67 kg/m  Wt Readings from Last 5 Encounters:  04/23/18 204 lb (92.5 kg)  02/14/18 204 lb 6.4 oz (92.7 kg)  01/04/18 199 lb (90.3 kg)  12/29/17 198 lb (89.8 kg)  10/04/17 197 lb (89.4 kg)      Gen: Well NAD HEENT: EOMI,  MMM Lungs: Normal work of breathing. CTABL Heart: RRR no MRG Abd: NABS, Soft. Nondistended, Nontender Exts: Brisk capillary refill, warm and well perfused.  Psych: Alert oriented normal speech thought process and affect.  Depression screen [facility] 2/9 04/23/2018 12/29/2017 08/15/2017 07/03/2017 12/16/2016  Decreased Interest 1 0 1 1 0  Down, Depressed, Hopeless _0 0  PHQ - 2 Score _1 0  Altered sleeping 0 - 0 0 -  Tired, decreased energy 1 - 2 3 -  Change in appetite 0 - 0 3 -  Feeling bad or failure about yourself  0 - 0 2 -  Trouble concentrating 0 - 0 0 -  Moving slowly or fidgety/restless 0 - 0 0 -  Suicidal thoughts 0 - 0 0 -  PHQ-9 Score 3 - 4 11 -  Difficult doing work/chores Not difficult at all - Not difficult at all Somewhat difficult -  Some recent data might be hidden    GAD 7 : Generalized Anxiety Score 04/23/2018 08/15/2017 07/03/2017  Nervous, Anxious, on Edge _2 Control/stop worrying 0 1 1  Worry too much - different things 0 1 2  Trouble relaxing 0 0 1  Restless 0 0 0  Easily annoyed or irritable 0 0 2  Afraid - awful might happen 0 1 2  Total GAD 7 Score _3 Anxiety Difficulty Not difficult at all - Somewhat difficult      Assessment and Plan: 53 y.o. male with  Well adult.  Doing quite well.  Continue current regimen.  Recheck in 6 months or so.  Continue current diabetes care.  Continue exercise and healthy diet.    PDMP not reviewed this encounter. No orders of the defined types were placed in this encounter.  Meds ordered this encounter  Medications  . Vilazodone HCl  (VIIBRYD) 20 MG TABS    Sig: Take 1 tablet (20 mg total) by mouth daily.    Dispense:  90 tablet    Refill:  1     Discussed warning signs or symptoms. Please see discharge instructions. Patient expresses understanding.

## 2018-04-23 NOTE — Patient Instructions (Signed)
Thank you for coming in today. Continue medicines.  Recheck in 6 months.  Return sooner if needed.

## 2018-04-25 ENCOUNTER — Encounter: Payer: Self-pay | Admitting: Internal Medicine

## 2018-04-25 ENCOUNTER — Ambulatory Visit (INDEPENDENT_AMBULATORY_CARE_PROVIDER_SITE_OTHER): Payer: 59 | Admitting: Internal Medicine

## 2018-04-25 VITALS — BP 118/70 | HR 67 | Ht 73.0 in | Wt 204.0 lb

## 2018-04-25 DIAGNOSIS — E1065 Type 1 diabetes mellitus with hyperglycemia: Secondary | ICD-10-CM

## 2018-04-25 LAB — POCT GLYCOSYLATED HEMOGLOBIN (HGB A1C): Hemoglobin A1C: 8.9 % — AB (ref 4.0–5.6)

## 2018-04-25 NOTE — Patient Instructions (Addendum)
Please continue - Metformin 2000 mg daily in am.  Pump settings: - basal rates: 12 am: 0.9 units/h 7 am: 1  10 pm: 0.9 - ICR:   12 am: 1:7 5 am: 1:6 >> 1:5     11 am: 1:5   4 pm: 1:5 - target: 110-110 - ISF:   12 am: 40  9 pm: 55 - Insulin on Board: 4h  If sugars <200 before exercise: Keep the pump off during exercise and keep it off for another 30-60 min.  If sugars >200 before exercise: decrease basal rate (temporal basal rate) during exercise and keep it at this basal rate for another 30-60 min.  Please return in 3-4 months.

## 2018-04-25 NOTE — Progress Notes (Signed)
Patient ID: Nathaniel [name], male   DOB: January 06, 1966, 53 y.o.   MRN: [id number]  HPI: Nathaniel [name] is a 53 y.o.-year-old man, returning for f/u for DM1, dx as DM2 in 2001, and as DM1 in 07/2017, insulin-dependent since 2010, uncontrolled, without long-term complications. Last visit 5 mo ago.  Last hemoglobin A1c was: Lab Results  Component Value Date   HGBA1C 8.3 (H) 11/21/2017   HGBA1C 8.6 (A) 08/25/2017   HGBA1C 9.8 06/16/2017  11/08/2013: 9.5% 04/11/2013: 9.8%  Previously on: - Metformin 2000 mg daily in am. - Lantus 12 >> 14 units in am and 10 >> 14 units at bedtime >> Tresiba  - Humalog: 7-9 units depending on the size of the meal Inject Humalog 10-15 minutes before a meal. He was on Januvia which we stopped summer 2019. Stopped Tanzeum b/c N/V >> was in UC x2. Trulicity denied by insurance until he tried Victoza >> he did not want to try this.  He started back on an insulin pump: Medtronic 630 G in 07/2017. Humalog in the pump. Supplies from [employer]  Using the arms now for infusion sets due to scar tissue on the abdomen.  He tried to use the thighs but this did not work well for him, but he keeps trying to use them.  Not using a libre CGM to save sites.  Pump settings: - basal rates: 12 am: 0.9 units/h 7 am: 1  10 pm: 0.9 - ICR:   12 am: 1:8 >> 1:7 5 am: 1:7 >> 1:6 11 am: 1:7 >> 1:5   4 pm: 1:8 >> 1:5 - target: 110-110 - ISF:   12 am: 50 >> 40  9 pm: 65 >> 55 - Insulin on Board: 4h  TDD from basal insulin: 46% >> 56% (23 units) >> 45% (22.4 units) TDD from bolus insulin: 54% >> 44% (17 units) >> 55% (27.26 units) TDD up to 60 units - changes infusion site: q2-3 days - Meter: [employer]  He is also on: - Metformin ER 2000 mg daily in am. Freestyle Libre CGM parameters: -  Average 168+/-56 >> 170+/-51.6 - coefficient of variation: 30.4 (19-25%) - time in range:  - low (<70): 1% - normal (70-180): 58% - high (>180): 41%  He checks his sugars an  average of 5 times a day by fingerstick, but many times a day with his freestyle libre CGM.  Lowest: 60s >> 41 at night >> 85 >> 55 Highest: 329 (overcorrection of a low CBG) >> 347 >> 360  He disconnects the pump during exercise if his sugars are lower than 200 but leaves it on the same basal rate if his sugars are higher than 200.  He does notice that his sugars are dropping after exercise.  Pt's meals are: - Breakfast: cereal + 1% milk, with protein bar - Lunch: sandwich - Dinner: eating out or eating in - Snacks: 1-2 a day  -No CKD, but last BUN/creatinine: Lab Results  Component Value Date   BUN 15 06/30/2017   CREATININE 0.94 06/30/2017   Normal GFR: Lab Results  Component Value Date   GFRNONAA >60 06/30/2017   GFRNONAA 76 11/18/2016   GFRNONAA 87 01/15/2016   GFRNONAA 83 10/13/2015   GFRNONAA 87 08/14/2015   GFRNONAA >90 05/20/2013   No MAU: Lab Results  Component Value Date   MICRALBCREAT 2.3 01/04/2018   MICRALBCREAT 1.3 11/18/2016   MICRALBCREAT 4.1 06/06/2014   MICRALBCREAT 5.3 12/19/2012   MICRALBCREAT 2.8 02/23/2011  MICRALBCREAT 10.2 05/28/2010   MICRALBCREAT 3.4 12/18/2008  On lisinopril.  -No HL; last set of lipids: Lab Results  Component Value Date   CHOL 136 01/04/2018   HDL 71.10 01/04/2018   LDLCALC 47 01/04/2018   TRIG 88.0 01/04/2018   CHOLHDL 2 01/04/2018   - last eye exam was 06/2017: No DR -He denies numbness and tingling in his feet.   He has OSA and uses a CPAP machine.  Last TSH was normal: Lab Results  Component Value Date   TSH 1.54 01/04/2018   He was running in the past, ran several 5K's.  At last visit he had hip pain so he could not run.  Now he is walking for 45 minutes and also lifting weights for approximately the same time.  ROS: Constitutional: no weight gain/no weight loss, no fatigue, no subjective hyperthermia, no subjective hypothermia Eyes: no blurry vision, no xerophthalmia ENT: no sore throat, no nodules  palpated in neck, no dysphagia, no odynophagia, no hoarseness Cardiovascular: no CP/no SOB/no palpitations/no leg swelling Respiratory: no cough/no SOB/no wheezing Gastrointestinal: no N/no V/no D/no C/no acid reflux Musculoskeletal: no muscle aches/no joint aches Skin: no rashes, no hair loss Neurological: no tremors/no numbness/no tingling/no dizziness  I reviewed pt's medications, allergies, PMH, social hx, family hx, and changes were documented in the history of present illness. Otherwise, unchanged from my initial visit note.  Past Medical History:  Diagnosis Date  . Depression   . Diabetes mellitus    type II  . GERD (gastroesophageal reflux disease)   . Headache(784.0)   . Hypogonadism male   . Sleep apnea    Past Surgical History:  Procedure Laterality Date  . NASAL SINUS SURGERY  04/1991  . TONSILLECTOMY  1972   Social History   Socioeconomic History  . Marital status: Married    Spouse name: Nathaniel [name]  . Number of children: 2  . Years of education: Not on file  . Highest education level: Not on file  Occupational History    Employer: [employer]    Comment: Mental Health Case Management  Social Needs  . Financial resource strain: Not on file  . Food insecurity:    Worry: Not on file    Inability: Not on file  . Transportation needs:    Medical: Not on file    Non-medical: Not on file  Tobacco Use  . Smoking status: Never Smoker  . Smokeless tobacco: Never Used  Substance and Sexual Activity  . Alcohol use: No  . Drug use: No  . Sexual activity: Not on file  Lifestyle  . Physical activity:    Days per week: Not on file    Minutes per session: Not on file  . Stress: Not on file  Relationships  . Social connections:    Talks on phone: Not on file    Gets together: Not on file    Attends religious service: Not on file    Active member of club or organization: Not on file    Attends meetings of clubs or organizations: Not on file    Relationship  status: Not on file  . Intimate partner violence:    Fear of current or ex partner: Not on file    Emotionally abused: Not on file    Physically abused: Not on file    Forced sexual activity: Not on file  Other Topics Concern  . Not on file  Social History Narrative   He lives with wife and  two children.   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   Highest level of education: master degree   Current Outpatient Medications on File Prior to Visit  Medication Sig Dispense Refill  . Alcohol Swabs (ALCOHOL PREPS) PADS Use alcohol pads to clean site to be used to check blood sugar. 100 each 11  . AMBULATORY NON FORMULARY MEDICATION Change CPAP setting to 13.5 (14 is ok if 13.5 is not available). Please provide new machine as old machine was not fixable and Mr Lamison has improved AHI with new machine.  Send to [facility] fax [phone] 1 each 0  . aspirin EC 81 MG tablet Take 2 tablets (162 mg total) by mouth daily. 90 tablet 3  . Blood Glucose Monitoring Suppl (CONTOUR NEXT MONITOR) w/Device KIT 1 Device by Does not apply route daily. Use to test blood sugar daily 1 kit 1  . Blood Glucose Monitoring Suppl (FREESTYLE LITE) DEVI Use to check sugar 4 times daily. 1 each 0  . Continuous Blood Gluc Sensor (FREESTYLE LIBRE 14 DAY SENSOR) MISC 1 each by Does not apply route every 14 (fourteen) days. Change every 2 weeks 6 each 3  . GLUCAGON EMERGENCY 1 MG injection INJECT 1 MG INTO THE MUSCLE ONCE AS NEEDED. 1 kit 3  . glucose blood (CONTOUR NEXT TEST) test strip Use as instructed to check blood sugar 5 times daily. 450 each 12  . HUMALOG 100 UNIT/ML injection INJECT 0.55 MLS (55 UNITS TOTAL) INTO THE SKIN DAILY. 10 mL 11  . Lancets (FREESTYLE) lancets Use as instructed to check sugar 4 times daily. 400 each 5  . lisinopril (PRINIVIL,ZESTRIL) 2.5 MG tablet TAKE 1 TABLET (2.5 MG TOTAL) BY MOUTH DAILY. 90 tablet 3  . metFORMIN (GLUCOPHAGE-XR) 500 MG 24 hr  tablet TAKE 4 TABLETS BY MOUTH DAILY in the morning 360 tablet 3  . ondansetron (ZOFRAN ODT) 4 MG disintegrating tablet Take one tab by mouth Q6hr prn nausea.  Dissolve under tongue. 12 tablet 0  . rosuvastatin (CRESTOR) 5 MG tablet Take 5 mg by mouth daily.    . Vilazodone HCl (VIIBRYD) 20 MG TABS Take 1 tablet (20 mg total) by mouth daily. 90 tablet 1   No current facility-administered medications on file prior to visit.    Allergies  Allergen Reactions  . Cymbalta [Duloxetine Hcl] Other (See Comments)    Suicidal thoughts  . Influenza Vaccines Other (See Comments)    Unknown reaction  . Penicillins Other (See Comments)    Unknown - childhood allergy per mother  . Pioglitazone Other (See Comments)      headache  . Promethazine Hcl Nausea And Vomiting       . Reglan [Metoclopramide] Other (See Comments)    Jittery, increased anxiety   Family History  Adopted: Yes  Problem Relation Age of Onset  . Cancer Father 61       Prostate cancer  . Healthy Mother   . Diabetes Mellitus I Son     PE: BP 118/70   Pulse 67   Ht 6' 1"  (1.854 m)   Wt 204 lb (92.5 kg)   SpO2 97%   BMI 26.91 kg/m  Body mass index is 26.91 kg/m.  Wt Readings from Last 3 Encounters:  04/25/18 204 lb (92.5 kg)  04/23/18 204 lb (92.5 kg)  02/14/18 204 lb 6.4 oz (92.7 kg)   Constitutional: Normal weight, in NAD Eyes: PERRLA, EOMI, no exophthalmos ENT: moist mucous membranes, no thyromegaly,  no cervical lymphadenopathy Cardiovascular: RRR, No MRG Respiratory: CTA B Gastrointestinal: abdomen soft, NT, ND, BS+ Musculoskeletal: no deformities, strength intact in all 4 Skin: moist, warm, no rashes Neurological: no tremor with outstretched hands, DTR normal in all 4  ASSESSMENT: 1. DM1, uncontrolled, without complications - C-peptide of 3.67 (0.8-3.9), from 06/16/2005 ([facility]). No associated glucose reported. - son with DM1 (also on an insulin pump)  Component     Latest Ref Rng & Units  08/17/2017  Glutamic Acid Decarb Ab     <5 IU/mL 25 (H)  Glucose, Plasma     65 - 99 mg/dL 102 (H)  C-Peptide     0.80 - 3.85 ng/mL 0.14 (L)  ZNT8 Antibodies     U/mL <15  Islet Cell Ab     Neg:<1:1 Negative   PLAN:   1. Patient with longstanding, uncontrolled diabetes, previously diagnosed as type II, however, diagnosed as type I in 07/2017 when his C-peptide returned low and his GAD antibodies returned high.  He started on the Medtronic 630 G insulin pump in 07/2017.  He continues on metformin. -We communicated several times since last visit adjusting his pump settings to correct high postprandial blood sugars -He is doing a great job checking sugars many times a day, introducing carbs in the pump (trying to stay on a low-carb diet) and bolusing for every meal. -He is also changing his pump site every 2-3 days, which is ideal -At this visit, reviewing his CGM and pump download, it still appears that his sugars increase after breakfast and then also usually after the other meals.  However, he does drop his sugars occasionally in the afternoon and upon questioning, these appear to be related to exercise.  He is usually disconnecting the pump if his sugars are lower than 200s and at this visit I advised him to try to extend the period of the pump to 30 to 60 minutes.  Also, if sugars are higher than 200s, I advised him to decrease the basal rate to 50% for the duration of exercise and 30 to 60 minutes afterwards. -Since sugars increase quite significantly after breakfast most of the time, and he feels that he introduces the right amount of carbs into his pump, will decrease his insulin to carb ratio slightly with this meal. -We will continue his current insulin sensitivity factor and the rest of the insulin to carb ratios for now. -I again emphasized the fact that he needs to start injecting insulin approximately 15 minutes before each meal and I explained why this is important. - I advised him  to: Patient Instructions  Please continue - Metformin 2000 mg daily in am.  Pump settings: - basal rates: 12 am: 0.9 units/h 7 am: 1  10 pm: 0.9 - ICR:   12 am: 1:7 5 am: 1:6 >> 1:5     11 am: 1:5   4 pm: 1:5 - target: 110-110 - ISF:   12 am: 40  9 pm: 55 - Insulin on Board: 4h  If sugars <200 before exercise: Keep the pump off during exercise and keep it off for another 30-60 min.  If sugars >200 before exercise: decrease basal rate (temporal basal rate) during exercise and keep it at this basal rate for another 30-60 min.  Please return in 3-4 months.   - today, HbA1c is 8.9% (higher) - continue checking sugars at different times of the day - check 4x a day, rotating checks - advised for yearly eye exams >>  he is UTD - Return to clinic in 3-4 mo with sugar log   - time spent with the patient: 30 min, of which >50% was spent in reviewing his pump and CGM downloads, discussing his hypo- and hyper-glycemic episodes, reviewing previous labs and pump settings and developing a plan to avoid hypo- and hyper-glycemia.   Philemon Kingdom, MD PhD [facility] Endocrinology

## 2018-04-26 ENCOUNTER — Encounter: Payer: Self-pay | Admitting: Internal Medicine

## 2018-05-16 MED FILL — HumaLOG 100 UNIT/ML SOLN: 100 | 55 days supply | Qty: 30 | Fill #2

## 2018-05-22 DIAGNOSIS — E1065 Type 1 diabetes mellitus with hyperglycemia: Secondary | ICD-10-CM | POA: Diagnosis not present

## 2018-05-24 MED FILL — VIIBRYD 20 MG TABLET: 20 | 30 days supply | Qty: 30 | Fill #1

## 2018-05-31 ENCOUNTER — Encounter: Payer: Self-pay | Admitting: Family Medicine

## 2018-06-04 MED FILL — HumaLOG 100 UNIT/ML SOLN: 100 | 55 days supply | Qty: 30 | Fill #3

## 2018-06-06 MED FILL — CONTOUR NEXT STRIPS: 90 days supply | Qty: 450 | Fill #2

## 2018-06-14 ENCOUNTER — Encounter: Payer: Self-pay | Admitting: Family Medicine

## 2018-06-27 ENCOUNTER — Encounter: Payer: Self-pay | Admitting: Internal Medicine

## 2018-07-02 ENCOUNTER — Ambulatory Visit (INDEPENDENT_AMBULATORY_CARE_PROVIDER_SITE_OTHER): Payer: 59

## 2018-07-02 ENCOUNTER — Encounter: Payer: Self-pay | Admitting: Family Medicine

## 2018-07-02 ENCOUNTER — Ambulatory Visit (INDEPENDENT_AMBULATORY_CARE_PROVIDER_SITE_OTHER): Payer: 59 | Admitting: Family Medicine

## 2018-07-02 ENCOUNTER — Other Ambulatory Visit: Payer: Self-pay

## 2018-07-02 VITALS — BP 133/80 | HR 84 | Ht 72.0 in | Wt 208.0 lb

## 2018-07-02 DIAGNOSIS — M25561 Pain in right knee: Secondary | ICD-10-CM

## 2018-07-02 DIAGNOSIS — M25562 Pain in left knee: Secondary | ICD-10-CM | POA: Diagnosis not present

## 2018-07-02 MED ORDER — DICLOFENAC SODIUM 1 % TD GEL: 4.0000 g | Freq: Four times a day (QID) | TRANSDERMAL | 11 refills | Status: DC

## 2018-07-02 MED ORDER — VILAZODONE HCL 20 MG PO TABS: 1.0000 | ORAL_TABLET | Freq: Every day | ORAL | 1 refills | Status: DC

## 2018-07-02 MED FILL — VIIBRYD 20 MG TABLET: 20 | 90 days supply | Qty: 90 | Fill #0

## 2018-07-02 MED FILL — DICLOFENAC SODIUM 1 % GEL: 1 | 6 days supply | Qty: 100 | Fill #0

## 2018-07-02 NOTE — Progress Notes (Signed)
Nathaniel [name] is a 53 y.o. male who presents to [facility] today for right knee pain.  Amarie notes a 2 to 3-week history of right medial knee pain.  He cannot recall any injury but notes that he cleaned out his garage and moved a huge amount of material about 2 to 3 weeks ago prior to the pain starting.  He does not recall a strain or pop or bruise.  He has been using relative rest and a knee brace which is helped a little.  He has had to stop running because of the pain which does annoy him.  He notes his blood sugars are less control because he cannot exercise normally.  He is tried ice and rest and elevation and ibuprofen which helped but not much at all either.  He denies significant locking or catching or giving way.  Pain is worse with full flexion or squatting.      ROS:  As above  Exam:  BP 133/80   Pulse 84   Ht 6' (1.829 m)   Wt 208 lb (94.3 kg)   BMI 28.21 kg/m  Wt Readings from Last 5 Encounters:  07/02/18 208 lb (94.3 kg)  04/25/18 204 lb (92.5 kg)  04/23/18 204 lb (92.5 kg)  02/14/18 204 lb 6.4 oz (92.7 kg)  01/04/18 199 lb (90.3 kg)   General: Well Developed, well nourished, and in no acute distress.  Neuro/Psych: Alert and oriented x3, extra-ocular muscles intact, able to move all 4 extremities, sensation grossly intact. Skin: Warm and dry, no rashes noted.  Respiratory: Not using accessory muscles, speaking in full sentences, trachea midline.  Cardiovascular: Pulses palpable, no extremity edema. Abdomen: Does not appear distended. MSK: Right knee: Relatively normal-appearing with no significant effusion or erythema or deformity. Range of motion 0-120 degrees Tender to palpation mildly at medial joint line. Stable ligamentous exam. Positive medial McMurray's test. Intact flexion and extension strength.  Contralateral  left knee normal-appearing normal motion nontender normal strength.    Lab and Radiology  Results No results found for this or any previous visit (from the past 72 hour(s)). Dg Knee 1-2 Views Left  Result Date: 07/02/2018 CLINICAL DATA:  Right knee pain. EXAM: LEFT KNEE - 1-2 VIEW COMPARISON:  None. FINDINGS: AP and tunneled left knee x-rays were obtained. No acute fracture or dislocation. Joint spaces are preserved. Soft tissues are unremarkable. IMPRESSION: Negative. Electronically Signed   By: Titus Dubin M.D.   On: 07/02/2018 15:05   Dg Knee Complete 4 Views Right  Result Date: 07/02/2018 CLINICAL DATA:  53 year old male with a history of medial right knee pain EXAM: RIGHT KNEE - COMPLETE 4+ VIEW COMPARISON:  None. FINDINGS: Right: No acute displaced fracture. No radiopaque foreign body. No soft tissue swelling. No joint effusion. No significant degenerative changes. Left: No acute displaced fracture. IMPRESSION: Negative for acute bony abnormality. No significant degenerative changes. Electronically Signed   By: Corrie Mckusick D.O.   On: 07/02/2018 14:48    I personally (independently) visualized and performed the interpretation of the images attached in this note.    Assessment and Plan: 53 y.o. male with right medial knee pain.  Unclear etiology but concerning for meniscus injury.  X-ray largely unremarkable.  Discussed options.  Plan for compressive knee sleeve as needed, and diclofenac gel and relative rest.  If not improving would consider MRI.  Would like to avoid steroid injections due to hyperglycemia risk with diabetes.  Viibryd refilled.  PDMP not reviewed this encounter. Orders Placed This Encounter  Procedures  . DG Knee Complete 4 Views Right    Please include patellar sunrise, lateral, and weightbearing bilateral AP and bilateral rosenberg views    Standing Status:   Future    Number of Occurrences:   1    Standing Expiration Date:   09/01/2019    Order Specific Question:   Reason for exam:    Answer:   Please include patellar sunrise, lateral, and  weightbearing bilateral AP and bilateral rosenberg views    Comments:   Please include patellar sunrise, lateral, and weightbearing bilateral AP and bilateral rosenberg views    Order Specific Question:   Preferred imaging location?    Answer:   [facility]  . DG Knee 1-2 Views Left    Standing Status:   Future    Number of Occurrences:   1    Standing Expiration Date:   09/02/2019    Order Specific Question:   Reason for Exam (SYMPTOM  OR DIAGNOSIS REQUIRED)    Answer:   For use with right knee x-ray, bilateral AP and Rosenberg standing.    Order Specific Question:   Preferred imaging location?    Answer:   [facility]   Meds ordered this encounter  Medications  . diclofenac sodium (VOLTAREN) 1 % GEL    Sig: Apply 4 g topically 4 (four) times daily. To affected joint.    Dispense:  100 g    Refill:  11  . Vilazodone HCl (VIIBRYD) 20 MG TABS    Sig: Take 1 tablet (20 mg total) by mouth daily.    Dispense:  90 tablet    Refill:  1    Historical information moved to improve visibility of documentation.  Past Medical History:  Diagnosis Date  . Depression   . Diabetes mellitus    type II  . GERD (gastroesophageal reflux disease)   . Headache(784.0)   . Hypogonadism male   . Sleep apnea    Past Surgical History:  Procedure Laterality Date  . NASAL SINUS SURGERY  04/1991  . TONSILLECTOMY  1972   Social History   Tobacco Use  . Smoking status: Never Smoker  . Smokeless tobacco: Never Used  Substance Use Topics  . Alcohol use: No   family history includes Cancer (age of onset: 45) in his father; Diabetes Mellitus I in his son; Healthy in his mother. He was adopted.  Medications: Current Outpatient Medications  Medication Sig Dispense Refill  . Alcohol Swabs (ALCOHOL PREPS) PADS Use alcohol pads to clean site to be used to check blood sugar. 100 each 11  . AMBULATORY NON FORMULARY MEDICATION Change CPAP setting to 13.5 (14 is ok if 13.5 is not  available). Please provide new machine as old machine was not fixable and Mr Gentzler has improved AHI with new machine.  Send to [facility] fax [phone] 1 each 0  . aspirin EC 81 MG tablet Take 2 tablets (162 mg total) by mouth daily. 90 tablet 3  . Blood Glucose Monitoring Suppl (CONTOUR NEXT MONITOR) w/Device KIT 1 Device by Does not apply route daily. Use to test blood sugar daily 1 kit 1  . Blood Glucose Monitoring Suppl (FREESTYLE LITE) DEVI Use to check sugar 4 times daily. 1 each 0  . Continuous Blood Gluc Sensor (FREESTYLE LIBRE 14 DAY SENSOR) MISC 1 each by Does not apply route every 14 (fourteen) days. Change every 2 weeks 6 each 3  .  GLUCAGON EMERGENCY 1 MG injection INJECT 1 MG INTO THE MUSCLE ONCE AS NEEDED. 1 kit 3  . glucose blood (CONTOUR NEXT TEST) test strip Use as instructed to check blood sugar 5 times daily. 450 each 12  . HUMALOG 100 UNIT/ML injection INJECT 0.55 MLS (55 UNITS TOTAL) INTO THE SKIN DAILY. 10 mL 11  . Lancets (FREESTYLE) lancets Use as instructed to check sugar 4 times daily. 400 each 5  . metFORMIN (GLUCOPHAGE-XR) 500 MG 24 hr tablet TAKE 4 TABLETS BY MOUTH DAILY in the morning 360 tablet 3  . ondansetron (ZOFRAN ODT) 4 MG disintegrating tablet Take one tab by mouth Q6hr prn nausea.  Dissolve under tongue. 12 tablet 0  . rosuvastatin (CRESTOR) 5 MG tablet Take 5 mg by mouth daily.    . Vilazodone HCl (VIIBRYD) 20 MG TABS Take 1 tablet (20 mg total) by mouth daily. 90 tablet 1  . diclofenac sodium (VOLTAREN) 1 % GEL Apply 4 g topically 4 (four) times daily. To affected joint. 100 g 11   No current facility-administered medications for this visit.    Allergies  Allergen Reactions  . Cymbalta [Duloxetine Hcl] Other (See Comments)    Suicidal thoughts  . Influenza Vaccines Other (See Comments)    Unknown reaction  . Penicillins Other (See Comments)    Unknown - childhood allergy per mother  . Pioglitazone Other (See Comments)      headache  .  Promethazine Hcl Nausea And Vomiting       . Reglan [Metoclopramide] Other (See Comments)    Jittery, increased anxiety      Discussed warning signs or symptoms. Please see discharge instructions. Patient expresses understanding.

## 2018-07-02 NOTE — Patient Instructions (Signed)
Thank you for coming in today. I am concerned about meniscus.  Apply the diclofenac gel for pain.  Relative rest and advance activity as tolerated.  Keep me updated.  If not getting better we may consider MRI in the future.  I think avoiding steroid injection is a good idea.      Meniscus Tear  A meniscus tear is a knee injury that happens when a piece of the meniscus is torn. The meniscus is a thick, rubbery, wedge-shaped cartilage in the knee. Two menisci are located in each knee. They sit between the upper bone (femur) and lower bone (tibia) that make up the knee joint. Each meniscus acts as a shock absorber for the knee. A torn meniscus is one of the most common types of knee injuries. This injury can range from mild to severe. Surgery may be needed to repair a severe tear. What are the causes? This condition may be caused by any kneeling, squatting, twisting, or pivoting movement. Sports-related injuries are the most common cause. These often occur from:  Running and stopping suddenly. ? Changing direction. ? Being tackled or knocked off your feet.  Lifting or carrying heavy weights. As people get older, their menisci get thinner and weaker. In these people, tears can happen more easily, such as from climbing stairs. What increases the risk? You are more likely to develop this condition if you:  Play contact sports.  Have a job that requires kneeling or squatting.  Are male.  Are over 53 years old. What are the signs or symptoms? Symptoms of this condition include:  Knee pain, especially at the side of the knee joint. You may feel pain when the injury occurs, or you may only hear a pop and feel pain later.  A feeling that your knee is clicking, catching, locking, or giving way.  Not being able to fully bend or extend your knee.  Bruising or swelling in your knee. How is this diagnosed? This condition may be diagnosed based on your symptoms and a physical exam. You  may also have tests, such as:  X-rays.  MRI.  A procedure to look inside your knee with a narrow surgical telescope (arthroscopy). You may be referred to a knee specialist (orthopedic surgeon). How is this treated? Treatment for this injury depends on the severity of the tear. Treatment for a mild tear may include:  Rest.  Medicine to reduce pain and swelling. This is usually a nonsteroidal anti-inflammatory drug (NSAID), like ibuprofen.  A knee brace, sleeve, or wrap.  Using crutches or a walker to keep weight off your knee and to help you walk.  Exercises to strengthen your knee (physical therapy). You may need surgery if you have a severe tear or if other treatments are not working. Follow these instructions at home: If you have a brace, sleeve, or wrap:  Wear it as told by your health care provider. Remove it only as told by your health care provider.  Loosen the brace, sleeve, or wrap if your toes tingle, become numb, or turn cold and blue.  Keep the brace, sleeve, or wrap clean and dry.  If the brace, sleeve, or wrap is not waterproof: ? Do not let it get wet. ? Cover it with a watertight covering when you take a bath or shower. Managing pain and swelling   Take over-the-counter and prescription medicines only as told by your health care provider.  If directed, put ice on your knee: ? If you have a  removable brace, sleeve, or wrap, remove it as told by your health care provider. ? Put ice in a plastic bag. ? Place a towel between your skin and the bag. ? Leave the ice on for 20 minutes, 2-3 times per day.  Move your toes often to avoid stiffness and to lessen swelling.  Raise (elevate) the injured area above the level of your heart while you are sitting or lying down. Activity  Do not use the injured limb to support your body weight until your health care provider says that you can. Use crutches or a walker as told by your health care provider.  Return to your  normal activities as told by your health care provider. Ask your health care provider what activities are safe for you.  Perform range-of-motion exercises only as told by your health care provider.  Begin doing exercises to strengthen your knee and leg muscles only as told by your health care provider. After you recover, your health care provider may recommend these exercises to help prevent another injury. General instructions  Use a knee brace, sleeve, or wrap as told by your health care provider.  Ask your health care provider when it is safe to drive if you have a brace, sleeve, or wrap on your knee.  Do not use any products that contain nicotine or tobacco, such as cigarettes, e-cigarettes, and chewing tobacco. If you need help quitting, ask your health care provider.  Ask your health care provider if the medicine prescribed to you: ? Requires you to avoid driving or using heavy machinery. ? Can cause constipation. You may need to take these actions to prevent or treat constipation:  Drink enough fluid to keep your urine pale yellow.  Take over-the-counter or prescription medicines.  Eat foods that are high in fiber, such as beans, whole grains, and fresh fruits and vegetables.  Limit foods that are high in fat and processed sugars, such as fried or sweet foods.  Keep all follow-up visits as told by your health care provider. This is important. Contact a health care provider if:  You have a fever.  Your knee becomes red, tender, or swollen.  Your pain medicine is not helping.  Your symptoms get worse or do not improve after 2 weeks of home care. Summary  A meniscus tear is a knee injury that happens when a piece of the meniscus is torn.  Treatment for this injury depends on the severity of the tear. You may need surgery if you have a severe tear or if other treatments are not working.  Rest, ice, and raise (elevate) your injured knee as told by your health care provider.  This will help lessen pain and swelling.  Contact a health care provider if you have new symptoms, or your symptoms get worse or do not improve after 2 weeks of home care.  Keep all follow-up visits as told by your health care provider. This is important. This information is not intended to replace advice given to you by your health care provider. Make sure you discuss any questions you have with your health care provider. Document Released: 05/28/2002 Document Revised: 09/19/2017 Document Reviewed: 09/19/2017 Elsevier Interactive Patient Education  2019 [employer].

## 2018-07-06 ENCOUNTER — Encounter: Payer: Self-pay | Admitting: Internal Medicine

## 2018-07-06 NOTE — Telephone Encounter (Signed)
Pulled patient's pump record and given to Dr. Elvera Lennox.

## 2018-07-09 ENCOUNTER — Telehealth: Payer: Self-pay | Admitting: Nutrition

## 2018-07-09 ENCOUNTER — Encounter: Payer: Self-pay | Admitting: Family Medicine

## 2018-07-09 NOTE — Telephone Encounter (Signed)
Patient reports that his blood sugars are doing better since he made the basal rate changes. FBS today was 99.   Discussed different areas on his buttocks that he might try, or back of his leg, below the buttocks areas.  He reported good understanding of what new sites to try. He was also informed that [employer] can get him a new pump at a cost of $999.00 to rent until his warrenty is over on his Medtronic pump.  He declined this.  He had no further questions.

## 2018-07-09 NOTE — Telephone Encounter (Signed)
Excellent! Thank you, Linda! 

## 2018-07-12 DIAGNOSIS — G4733 Obstructive sleep apnea (adult) (pediatric): Secondary | ICD-10-CM | POA: Diagnosis not present

## 2018-07-16 ENCOUNTER — Encounter: Payer: Self-pay | Admitting: Internal Medicine

## 2018-07-16 MED FILL — metFORMIN HCL ER 500 MG TB2: 500 | 90 days supply | Qty: 360 | Fill #0

## 2018-07-18 ENCOUNTER — Encounter: Payer: Self-pay | Admitting: Family Medicine

## 2018-07-18 DIAGNOSIS — G4733 Obstructive sleep apnea (adult) (pediatric): Secondary | ICD-10-CM | POA: Diagnosis not present

## 2018-07-19 MED ORDER — AMBULATORY NON FORMULARY MEDICATION: 0 refills | Status: DC

## 2018-07-27 ENCOUNTER — Other Ambulatory Visit: Payer: Self-pay | Admitting: Pharmacist

## 2018-07-28 ENCOUNTER — Encounter: Payer: Self-pay | Admitting: Family Medicine

## 2018-07-30 ENCOUNTER — Telehealth: Payer: Self-pay | Admitting: Nutrition

## 2018-07-30 ENCOUNTER — Telehealth: Payer: Self-pay | Admitting: Family Medicine

## 2018-07-30 DIAGNOSIS — M25561 Pain in right knee: Secondary | ICD-10-CM

## 2018-07-30 NOTE — Telephone Encounter (Signed)
Pt called and stated his right knee is continuing to get worse. He stated Dr. Denyse Amass had said the next step would be a MRI, Pt is ready to proceed with that. Will forward to provider to review.

## 2018-07-30 NOTE — Telephone Encounter (Signed)
Patient called and we discussed the fact that it will cost him money to change to a [employer] because he is still under warrenty with his Medtronic pump.  I received permission to have [employer] rep. Call him to see what he wants to do.  Also told him he can go on the Dexcom and still stay on Medtronic pump, but no auto mode, and or he will need to put in all blood sugar readings for every bolus and correction needed.  He reported good understanding of this with no questions. [employer] rep. Called a voice mail let to call patient about pump change.

## 2018-07-31 NOTE — Telephone Encounter (Signed)
Nathaniel [name] told me that he talked with Nathaniel [name], and that his pump is not out of warrenty until November.  They will try to get it with a pharmacy benefit, but if not, he does not want to pay the $999.00 to get the [employer]

## 2018-07-31 NOTE — Telephone Encounter (Signed)
Advised pt that a MRI was ordered for patient.

## 2018-07-31 NOTE — Telephone Encounter (Signed)
Knee pain ongoing for more than 6 weeks after failing conservative management and physician directed exercises.  Plan for MRI.  Will discuss results after MRI.

## 2018-08-01 ENCOUNTER — Ambulatory Visit (INDEPENDENT_AMBULATORY_CARE_PROVIDER_SITE_OTHER): Payer: 59 | Admitting: Internal Medicine

## 2018-08-01 ENCOUNTER — Encounter: Payer: Self-pay | Admitting: Internal Medicine

## 2018-08-01 DIAGNOSIS — E1065 Type 1 diabetes mellitus with hyperglycemia: Secondary | ICD-10-CM

## 2018-08-01 NOTE — Patient Instructions (Addendum)
Please continue: - Metformin 2000 mg daily in am.  Pump settings: - basal rates: 12 am: 1.2 units/h 7 am: 1.3 10 pm: 1.2 - ICR:   12 am: 1:7 5 am: 1:6.5 >> 5 11 am: 1:5.5 >> 4.5  4 pm: 1:5 >> 4.5 - target: 110-110 - ISF:   12 am: 40 >> 30  9 pm: 55 >> 45 - Insulin on Board: 4h  When you restart exercise: If sugars <200 before exercise: Keep the pump off during exercise and keep it off for another 30-60 min.  If sugars >200 before exercise: decrease basal rate (temporal basal rate) during exercise and keep it at this basal rate for another 30-60 min.  Please look up: Mastering Diabetes: The Revolutionary Method to Reverse Insulin Resistance Permanently in Type 1, Type 1.5, Type 2, Prediabetes, and Gestational Diabetes  Book by Richardson Landry and Lamona Curl  Please return in 3 months.

## 2018-08-01 NOTE — Progress Notes (Signed)
Patient ID: Nathaniel [name], male   DOB: 11-11-1965, 53 y.o.   MRN: [id number]  Patient location: Home My location: Office  Referring Provider: Gregor Hams, MD  I connected with the patient on 08/01/18 at 10:18 AM EDT by a video enabled telemedicine application and verified that I am speaking with the correct person.   I discussed the limitations of evaluation and management by telemedicine and the availability of in person appointments. The patient expressed understanding and agreed to proceed.   Details of the encounter are shown below.  HPI: Nathaniel [name] is a 53 y.o.-year-old man, presenting for f/u for DM1, dx as DM2 in 2001, and as DM1 in 07/2017, insulin-dependent since 2010, uncontrolled, without long-term complications. Last visit 3 months ago.  He hurt his knee 5 weeks ago >> cannot exercise and sometimes even walk. He gained 11 lbs >> sugars are higher.  Reviewed HbA1c levels: Lab Results  Component Value Date   HGBA1C 8.9 (A) 04/25/2018   HGBA1C 8.3 (H) 11/21/2017   HGBA1C 8.6 (A) 08/25/2017  11/08/2013: 9.5% 04/11/2013: 9.8%  Previously on: - Metformin 2000 mg daily in am. - Lantus 12 >> 14 units in am and 10 >> 14 units at bedtime >> Tresiba  - Humalog: 7-9 units depending on the size of the meal Inject Humalog 10-15 minutes before a meal. He was on Januvia which we stopped summer 2019. Stopped Tanzeum b/c N/V >> was in UC x2. Trulicity denied by insurance until he tried Victoza >> he did not want to try this.   He started back on insulin pump: Medtronic 630 G0 07/2017.  He is interested to switch to [employer] T slim pump, but he can only do so after 01/2019.  If he tries to change it sooner, his co-pay would be approximately $1000. He has Humalog in the pump. He gets supplies from [employer].  He is now using his arm for infusion sets due to scar tissue on the abdomen.  He tried to use his thighs, but this did not work well for him. He is currently using a  freestyle libre CGM save sites.  Pump settings (changes since last visit shown in bold): - basal rates: 12 am: 0.9 >> 1.2 units/h 7 am: 1 >> 1.3 10 pm: 0.9 >> 1.2 - ICR:   12 am: 1:7 5 am: 1:6 >> 1:6.5 11 am: 1:5 >> 1:5.5  4 pm: 1:5 - target: 110-110 - ISF:   12 am: 40  9 pm: 55 - Insulin on Board: 4h  TDD from basal insulin: 45% (22.4 units) >> 45% (30 units) TDD from bolus insulin: 55% (27.26 units) >> 55% (35 units) TDD up to 19 units - changes infusion site: q 2-3 days - Meter: [employer]  He is also on: - Metformin ER 2000 mg daily in am.  He checks his sugars 8.5 times a day by fingerstick and many times a day with his freestyle libre CGM. Average for the last 2 weeks: 246+/-68  Lowest: 41 at night >> 85 >> 55 >> 75 (after exercise). Highest: 360 >> 400  He noticed before that his sugars were dropping after exercise.  He was disconnecting the pump during exercise.  He now has pain in his knee and did not exercise for some time.  Pt's meals are: - Breakfast: cereal + 1% milk, with protein bar - Lunch: sandwich - Dinner: eating out or eating in - Snacks: 1-2 a day  -No CKD, BUN/creatinine: Lab Results  Component Value Date   BUN 15 06/30/2017   CREATININE 0.94 06/30/2017   Normal GFR: Lab Results  Component Value Date   GFRNONAA >60 06/30/2017   GFRNONAA 76 11/18/2016   GFRNONAA 87 01/15/2016   GFRNONAA 83 10/13/2015   GFRNONAA 87 08/14/2015   GFRNONAA >90 05/20/2013   No MAU: Lab Results  Component Value Date   MICRALBCREAT 2.3 01/04/2018   MICRALBCREAT 1.3 11/18/2016   MICRALBCREAT 4.1 06/06/2014   MICRALBCREAT 5.3 12/19/2012   MICRALBCREAT 2.8 02/23/2011   MICRALBCREAT 10.2 05/28/2010   MICRALBCREAT 3.4 12/18/2008  On lisinopril.  -No HL; last set of lipids: Lab Results  Component Value Date   CHOL 136 01/04/2018   HDL 71.10 01/04/2018   LDLCALC 47 01/04/2018   TRIG 88.0 01/04/2018   CHOLHDL 2 01/04/2018   - last eye exam was  06/2017: No DR - No numbness and tingling in his feet.   He has OSA and uses a CPAP machine.  Last TSH normal: Lab Results  Component Value Date   TSH 1.54 01/04/2018    ROS: Constitutional: + weight gain/no weight loss, no fatigue, no subjective hyperthermia, no subjective hypothermia Eyes: no blurry vision, no xerophthalmia ENT: no sore throat, no nodules palpated in neck, no dysphagia, no odynophagia, no hoarseness Cardiovascular: no CP/no SOB/no palpitations/no leg swelling Respiratory: no cough/no SOB/no wheezing Gastrointestinal: no N/no V/no D/no C/no acid reflux Musculoskeletal: no muscle aches/+ joint aches Skin: no rashes, no hair loss Neurological: no tremors/no numbness/no tingling/no dizziness  I reviewed pt's medications, allergies, PMH, social hx, family hx, and changes were documented in the history of present illness. Otherwise, unchanged from my initial visit note.  Past Medical History:  Diagnosis Date  . Depression   . Diabetes mellitus    type II  . GERD (gastroesophageal reflux disease)   . Headache(784.0)   . Hypogonadism male   . Sleep apnea    Past Surgical History:  Procedure Laterality Date  . NASAL SINUS SURGERY  04/1991  . TONSILLECTOMY  1972   Social History   Socioeconomic History  . Marital status: Married    Spouse name: [name]  . Number of children: 2  . Years of education: Not on file  . Highest education level: Not on file  Occupational History    Employer: [employer]    Comment: Mental Health Case Management  Social Needs  . Financial resource strain: Not on file  . Food insecurity:    Worry: Not on file    Inability: Not on file  . Transportation needs:    Medical: Not on file    Non-medical: Not on file  Tobacco Use  . Smoking status: Never Smoker  . Smokeless tobacco: Never Used  Substance and Sexual Activity  . Alcohol use: No  . Drug use: No  . Sexual activity: Not on file  Lifestyle  . Physical activity:     Days per week: Not on file    Minutes per session: Not on file  . Stress: Not on file  Relationships  . Social connections:    Talks on phone: Not on file    Gets together: Not on file    Attends religious service: Not on file    Active member of club or organization: Not on file    Attends meetings of clubs or organizations: Not on file    Relationship status: Not on file  . Intimate partner violence:    Fear of current or  ex partner: Not on file    Emotionally abused: Not on file    Physically abused: Not on file    Forced sexual activity: Not on file  Other Topics Concern  . Not on file  Social History Narrative   He lives with wife and two children.   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   Highest level of education: master degree   Current Outpatient Medications on File Prior to Visit  Medication Sig Dispense Refill  . Alcohol Swabs (ALCOHOL PREPS) PADS Use alcohol pads to clean site to be used to check blood sugar. 100 each 11  . AMBULATORY NON FORMULARY MEDICATION Change CPAP setting to 13.5 (14 is ok if 13.5 is not available). Please provide new machine as old machine was not fixable and Mr Sargent has improved AHI with new machine. 1 each 0  . aspirin EC 81 MG tablet Take 2 tablets (162 mg total) by mouth daily. 90 tablet 3  . Blood Glucose Monitoring Suppl (CONTOUR NEXT MONITOR) w/Device KIT 1 Device by Does not apply route daily. Use to test blood sugar daily 1 kit 1  . Blood Glucose Monitoring Suppl (FREESTYLE LITE) DEVI Use to check sugar 4 times daily. 1 each 0  . Continuous Blood Gluc Sensor (FREESTYLE LIBRE 14 DAY SENSOR) MISC 1 each by Does not apply route every 14 (fourteen) days. Change every 2 weeks 6 each 3  . diclofenac sodium (VOLTAREN) 1 % GEL Apply 4 g topically 4 (four) times daily. To affected joint. 100 g 11  . GLUCAGON EMERGENCY 1 MG injection INJECT 1 MG INTO THE MUSCLE ONCE AS NEEDED. 1 kit 3  . glucose blood  (CONTOUR NEXT TEST) test strip Use as instructed to check blood sugar 5 times daily. 450 each 12  . HUMALOG 100 UNIT/ML injection INJECT 0.55 MLS (55 UNITS TOTAL) INTO THE SKIN DAILY. 10 mL 11  . Lancets (FREESTYLE) lancets Use as instructed to check sugar 4 times daily. 400 each 5  . metFORMIN (GLUCOPHAGE-XR) 500 MG 24 hr tablet TAKE 4 TABLETS BY MOUTH DAILY in the morning 360 tablet 3  . ondansetron (ZOFRAN ODT) 4 MG disintegrating tablet Take one tab by mouth Q6hr prn nausea.  Dissolve under tongue. 12 tablet 0  . rosuvastatin (CRESTOR) 5 MG tablet Take 5 mg by mouth daily.    . Vilazodone HCl (VIIBRYD) 20 MG TABS Take 1 tablet (20 mg total) by mouth daily. 90 tablet 1   No current facility-administered medications on file prior to visit.    Allergies  Allergen Reactions  . Cymbalta [Duloxetine Hcl] Other (See Comments)    Suicidal thoughts  . Influenza Vaccines Other (See Comments)    Unknown reaction  . Penicillins Other (See Comments)    Unknown - childhood allergy per mother  . Pioglitazone Other (See Comments)      headache  . Promethazine Hcl Nausea And Vomiting       . Reglan [Metoclopramide] Other (See Comments)    Jittery, increased anxiety   Family History  Adopted: Yes  Problem Relation Age of Onset  . Cancer Father 64       Prostate cancer  . Healthy Mother   . Diabetes Mellitus I Son     PE: There were no vitals taken for this visit. There is no height or weight on file to calculate BMI.  Wt Readings from Last 3 Encounters:  07/02/18 208 lb (94.3 kg)  04/25/18 204 lb (  92.5 kg)  04/23/18 204 lb (92.5 kg)   Constitutional: Slightly overweight, in NAD Eyes: PERRLA, EOMI, no exophthalmos ENT: moist mucous membranes, no thyromegaly, no cervical lymphadenopathy Cardiovascular: RRR, No MRG Respiratory: CTA B Gastrointestinal: abdomen soft, NT, ND, BS+ Musculoskeletal: no deformities, strength intact in all 4 Skin: moist, warm, no rashes Neurological: no  tremor with outstretched hands, DTR normal in all 4  ASSESSMENT: 1. DM1, uncontrolled, without complications - C-peptide of 3.67 (0.8-3.9), from 06/16/2005 ([facility]). No associated glucose reported. - son with DM1 (also on an insulin pump)  Component     Latest Ref Rng & Units 08/17/2017  Glutamic Acid Decarb Ab     <5 IU/mL 25 (H)  Glucose, Plasma     65 - 99 mg/dL 102 (H)  C-Peptide     0.80 - 3.85 ng/mL 0.14 (L)  ZNT8 Antibodies     U/mL <15  Islet Cell Ab     Neg:<1:1 Negative   PLAN:   1. Patient with longstanding, uncontrolled, type 1 diabetes, on an insulin pump (Medtronic 630 G, changed in 07/2017).  He also continues on metformin.  Since last visit, we communicated several times, by decreasing his insulin to carb ratios and then increasing his basal rates.  At this visit, we downloaded his pump and it appears that his sugars still increase significantly after each meal, even to the 300s and occasionally 400s.  He is less active now due to joint pain (ED).  In the past, he was running consistently.  Otherwise, he is doing a good job checking his sugars many times a day, introducing carbs in the pump (he tries to stay on a low-carb diet) bolusing with every meal.  He changeS his pump site every 2 to 3 days, which is great.  He does not have any more lows at this visit. -Reviewing his pump downloads, most of his boluses are given with correction and he almost always has to correct sugars after meals.  This is most likely due to his being less active due to his knee pain and also the coronavirus pandemic (he now works from home).  He gained 11 pounds.  We discussed about the concept of increased insulin resistance and ways to decrease it.  I suggested changing his diet to a lower fat one and adding more fresh fruits and veggies in his diet.  Given references for this (the book must 3 diabetes (please see patient instructions). I also suggested not snacking between meals and only  drinking water with lemon or other 0-calorie drinks instead of snacking.  I also suggested chair exercises with bands which can still improve his blood sugars without putting pressure on his knee. -For now, since sugars are still high after meals, will decrease his insulin to carb ratios with each meal and will also decrease his insulin sensitivity factors.  I strongly advised him to going back to the previous pump settings if he restarts exercise or starts to be more active to avoid hypoglycemia.  As of now, he had no lows since last visit. - I advised him to: Patient Instructions  Please continue: - Metformin 2000 mg daily in am.  Pump settings: - basal rates: 12 am: 1.2 units/h 7 am: 1.3 10 pm: 1.2 - ICR:   12 am: 1:7 5 am: 1:6.5 >> 5 11 am: 1:5.5 >> 4.5  4 pm: 1:5 >> 4.5 - target: 110-110 - ISF:   12 am: 40 >> 30  9 pm: 55 >> 45 -  Insulin on Board: 4h  When you restart exercise: If sugars <200 before exercise: Keep the pump off during exercise and keep it off for another 30-60 min.  If sugars >200 before exercise: decrease basal rate (temporal basal rate) during exercise and keep it at this basal rate for another 30-60 min.  Please look up: Mastering Diabetes: The Revolutionary Method to Reverse Insulin Resistance Permanently in Type 1, Type 1.5, Type 2, Prediabetes, and Gestational Diabetes  Book by Jenny Reichmann and Hildred Laser  Please return in 3 months.    - We will check his HbA1c when he returns to the clinic - continue checking sugars at different times of the day - check >4x a day, rotating checks - advised for yearly eye exams >> he is UTD - Return to clinic in 3 mo with sugar log   - time spent with the patient: 25 min, of which >50% was spent in reviewing his pump and CGM downloads, discussing his hypo- and hyper-glycemic episodes, reviewing previous labs and pump settings and developing a plan to avoid hypo- and hyper-glycemia.   Philemon Kingdom, MD  PhD [facility] Endocrinology

## 2018-08-02 ENCOUNTER — Other Ambulatory Visit: Payer: Self-pay | Admitting: Family Medicine

## 2018-08-05 ENCOUNTER — Other Ambulatory Visit: Payer: 59

## 2018-08-07 ENCOUNTER — Ambulatory Visit: Payer: 59 | Admitting: Family Medicine

## 2018-08-07 ENCOUNTER — Encounter: Payer: Self-pay | Admitting: Family Medicine

## 2018-08-07 ENCOUNTER — Other Ambulatory Visit: Payer: Self-pay

## 2018-08-07 ENCOUNTER — Other Ambulatory Visit: Payer: Self-pay | Admitting: Family Medicine

## 2018-08-07 ENCOUNTER — Ambulatory Visit: Payer: Self-pay

## 2018-08-07 VITALS — BP 115/82 | HR 82 | Ht 72.0 in | Wt 215.0 lb

## 2018-08-07 DIAGNOSIS — M25561 Pain in right knee: Secondary | ICD-10-CM | POA: Diagnosis not present

## 2018-08-07 HISTORY — DX: Pain in right knee: M25.561

## 2018-08-07 NOTE — Patient Instructions (Signed)
Nice to meet you Please try to be non weight bearing as much as possible for the next 4 weeks.  Please start taking vitamin D I will email your endocrinologist about taking vitamin K2  Please try tylenol and ice   Please send me a message in MyChart with any questions or updates.  Please see me back in weeks.  I will call you with the results from today.   --Dr. Jordan Likes

## 2018-08-07 NOTE — Progress Notes (Signed)
Nathaniel [name] - 53 y.o. male MRN [id number]  Date of birth: [birth year]  SUBJECTIVE:  Including CC & ROS.  Chief Complaint  Patient presents with  . Knee Injury    right knee    Nathaniel [name] is a 53 y.o. male that is presenting with right knee pain.  The pain is occurring over the right more medial condyle.  The pain is severe and worse after ambulation or jogging.  He was in his garage about 4 to 5 weeks ago and hit the inside aspect of his knee on something.  Since that time he has had a significant pain.  He has gained weight because he is unable to exercise like he normally does.  He has tried wearing a knee sleeve with limited improvement.  Pain seems to be staying the same.  Denies any mechanical symptoms.  Pain is sharp and stabbing.  Independent review of the right knee x-ray from 4/13 shows no significant degenerative changes.   Review of Systems  Constitutional: Negative for fever.  HENT: Negative for congestion.   Respiratory: Negative for cough.   Cardiovascular: Negative for chest pain.  Gastrointestinal: Negative for abdominal distention.  Musculoskeletal: Negative for gait problem.  Skin: Negative for color change.  Neurological: Negative for weakness.  Hematological: Negative for adenopathy.    HISTORY: Past Medical, Surgical, Social, and Family History Reviewed & Updated per EMR.   Pertinent Historical Findings include:  Past Medical History:  Diagnosis Date  . Depression   . Diabetes mellitus    type II  . GERD (gastroesophageal reflux disease)   . Headache(784.0)   . Hypogonadism male   . Sleep apnea     Past Surgical History:  Procedure Laterality Date  . NASAL SINUS SURGERY  04/1991  . TONSILLECTOMY  1972    Allergies  Allergen Reactions  . Cymbalta [Duloxetine Hcl] Other (See Comments)    Suicidal thoughts  . Influenza Vaccines Other (See Comments)    Unknown reaction  . Penicillins Other (See Comments)    Unknown - childhood allergy per  mother  . Pioglitazone Other (See Comments)      headache  . Promethazine Hcl Nausea And Vomiting       . Reglan [Metoclopramide] Other (See Comments)    Jittery, increased anxiety    Family History  Adopted: Yes  Problem Relation Age of Onset  . Cancer Father 65       Prostate cancer  . Healthy Mother   . Diabetes Mellitus I Son      Social History   Socioeconomic History  . Marital status: Married    Spouse name: Asencion Islam  . Number of children: 2  . Years of education: Not on file  . Highest education level: Not on file  Occupational History    Employer: [employer]    Comment: Mental Health Case Management  Social Needs  . Financial resource strain: Not on file  . Food insecurity:    Worry: Not on file    Inability: Not on file  . Transportation needs:    Medical: Not on file    Non-medical: Not on file  Tobacco Use  . Smoking status: Never Smoker  . Smokeless tobacco: Never Used  Substance and Sexual Activity  . Alcohol use: No  . Drug use: No  . Sexual activity: Not on file  Lifestyle  . Physical activity:    Days per week: Not on file    Minutes per session:  Not on file  . Stress: Not on file  Relationships  . Social connections:    Talks on phone: Not on file    Gets together: Not on file    Attends religious service: Not on file    Active member of club or organization: Not on file    Attends meetings of clubs or organizations: Not on file    Relationship status: Not on file  . Intimate partner violence:    Fear of current or ex partner: Not on file    Emotionally abused: Not on file    Physically abused: Not on file    Forced sexual activity: Not on file  Other Topics Concern  . Not on file  Social History Narrative   He lives with wife and two children.   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   Highest level of education: Child psychotherapistmaster degree     PHYSICAL EXAM:  VS: BP 115/82   Pulse 82   Ht 6'  (1.829 m)   Wt 215 lb (97.5 kg)   BMI 29.16 kg/m  Physical Exam Gen: NAD, alert, cooperative with exam, well-appearing ENT: normal lips, normal nasal mucosa,  Eye: normal EOM, normal conjunctiva and lids CV:  no edema, +2 pedal pulses   Resp: no accessory muscle use, non-labored,  GI: no masses or tenderness, no hernia  Skin: no rashes, no areas of induration  Neuro: normal tone, normal sensation to touch Psych:  normal insight, alert and oriented MSK:  Right knee: No signs of effusion. Significant tenderness to palpation over the medial femoral condyle. Normal range of motion. Normal strength resistance. No instability with valgus or varus stress testing. No significant tenderness over the joint line. Negative McMurray's test. Normal gait. Neurovascular intact  Limited ultrasound: right knee:  No signs of effusion  Normal appearing medial joint line space. No signs of meniscus tear  Hypoechoic change of the medial femoral condyle. Doesn't seem to involve the origin of the MCL. There appears to be a defect of the cortex of the medial femoral condyle. Hyperaemia in the area.  Normal appearing QT and PT   Summary: findings suggest a possible fracture of medial femoral condyle   Ultrasound and interpretation by Clare GandyJeremy Schmitz, MD      ASSESSMENT & PLAN:   Acute pain of right knee Findings suggest a small fracture of the medial femoral condyle. He had a trauma that incited the event. Less likely for MCL with no instability on exam. Less likely for meniscus.  - crutches. Try to maintain NWB over the next 4 weeks  - vitamin D  - consider vitamin k2  - counseled on supportive care - if no improvement would consider MRI

## 2018-08-08 ENCOUNTER — Encounter: Payer: Self-pay | Admitting: Family Medicine

## 2018-08-08 LAB — VITAMIN D 25 HYDROXY (VIT D DEFICIENCY, FRACTURES): Vit D, 25-Hydroxy: 33.1 ng/mL (ref 30.0–100.0)

## 2018-08-08 NOTE — Assessment & Plan Note (Signed)
Findings suggest a small fracture of the medial femoral condyle. He had a trauma that incited the event. Less likely for MCL with no instability on exam. Less likely for meniscus.  - crutches. Try to maintain NWB over the next 4 weeks  - vitamin D  - consider vitamin k2  - counseled on supportive care - if no improvement would consider MRI

## 2018-08-09 ENCOUNTER — Encounter: Payer: Self-pay | Admitting: Internal Medicine

## 2018-08-10 ENCOUNTER — Telehealth: Payer: Self-pay | Admitting: Family Medicine

## 2018-08-10 ENCOUNTER — Other Ambulatory Visit: Payer: Self-pay | Admitting: Internal Medicine

## 2018-08-10 MED ORDER — VITAMIN D (ERGOCALCIFEROL) 1.25 MG (50000 UNIT) PO CAPS: 50000.0000 [IU] | ORAL_CAPSULE | ORAL | 0 refills | Status: DC

## 2018-08-10 MED ORDER — INSULIN LISPRO 100 UNIT/ML ~~LOC~~ SOLN: SUBCUTANEOUS | 11 refills | Status: DC

## 2018-08-10 MED FILL — ROSUVASTATIN CALCIUM 5 MG T: 5 | 90 days supply | Qty: 90 | Fill #0

## 2018-08-10 MED FILL — HumaLOG 100 UNIT/ML SOLN: 100 | 43 days supply | Qty: 30 | Fill #0

## 2018-08-10 MED FILL — VIT D2 1.25 MG (50,000 UNIT: 1.25 MG | 56 days supply | Qty: 8 | Fill #0

## 2018-08-10 NOTE — Telephone Encounter (Signed)
Left VM for patient. If he calls back please have him speak with a nurse/CMA and inform that his vitamin D is on the low end of normal. I will send in a prescription of vitamin D. The [facility] can report results to patient.   If any questions then please take the best time and phone number to call and I will try to call him back.   Myra Rude, MD [facility] 08/10/2018, 8:38 AM

## 2018-08-11 ENCOUNTER — Encounter: Payer: Self-pay | Admitting: Family Medicine

## 2018-08-15 ENCOUNTER — Encounter: Payer: Self-pay | Admitting: Family Medicine

## 2018-08-17 DIAGNOSIS — E1065 Type 1 diabetes mellitus with hyperglycemia: Secondary | ICD-10-CM | POA: Diagnosis not present

## 2018-08-17 MED FILL — CONTOUR NEXT STRIPS: 90 days supply | Qty: 450 | Fill #3

## 2018-08-18 ENCOUNTER — Encounter: Payer: Self-pay | Admitting: Family Medicine

## 2018-08-19 ENCOUNTER — Other Ambulatory Visit: Payer: Self-pay

## 2018-08-19 ENCOUNTER — Ambulatory Visit (INDEPENDENT_AMBULATORY_CARE_PROVIDER_SITE_OTHER): Payer: 59

## 2018-08-19 DIAGNOSIS — M23321 Other meniscus derangements, posterior horn of medial meniscus, right knee: Secondary | ICD-10-CM | POA: Diagnosis not present

## 2018-08-19 DIAGNOSIS — M25561 Pain in right knee: Secondary | ICD-10-CM | POA: Diagnosis not present

## 2018-08-20 ENCOUNTER — Encounter: Payer: Self-pay | Admitting: Family Medicine

## 2018-08-20 ENCOUNTER — Telehealth: Payer: 59 | Admitting: Family Medicine

## 2018-08-20 ENCOUNTER — Ambulatory Visit (INDEPENDENT_AMBULATORY_CARE_PROVIDER_SITE_OTHER): Payer: 59 | Admitting: Family Medicine

## 2018-08-20 DIAGNOSIS — M25561 Pain in right knee: Secondary | ICD-10-CM | POA: Diagnosis not present

## 2018-08-20 DIAGNOSIS — S83241A Other tear of medial meniscus, current injury, right knee, initial encounter: Secondary | ICD-10-CM

## 2018-08-20 DIAGNOSIS — S72414A Nondisplaced unspecified condyle fracture of lower end of right femur, initial encounter for closed fracture: Secondary | ICD-10-CM

## 2018-08-20 NOTE — Progress Notes (Signed)
  Virtual Visit  via Video Note  I connected with      Nathaniel [name] by a video enabled telemedicine application and verified that I am speaking with the correct person using two identifiers.   I discussed the limitations of evaluation and management by telemedicine and the availability of in person appointments. The patient expressed understanding and agreed to proceed.  History of Present Illness: Nathaniel [name] is a 53 y.o. male who would like to discuss knee pain.  Patient has been dealing with right knee pain for some time now.  He suffered an injury in early April.  He was seen in my clinic on April 13.  At that time x-rays were largely unremarkable.  He had some trials of conservative management and never really resolved.  He had a second opinion with 1 of my colleagues on May 19 where ultrasound was suspicious for femoral condyle impaction fracture.  He was advised to proceed with nonweightbearing.  He did not really do much nonweightbearing and notes the pain continued to worsen.  Ultimately the pain is become quite bad and he agreed to proceed with MRI.  MRI in the interim has been obtained and shows a small meniscus tear at the medial meniscus as well as a small probable medial femoral condyle impaction fracture.   Observations/Objective:  Appearance nontoxic no acute distress Normal Speech.    Lab and Radiology Results No results found for this or any previous visit (from the past 72 hour(s)). Mr Knee Right Wo Contrast  Result Date: 08/20/2018 CLINICAL DATA:  Right medial knee pain and weakness for 7 weeks EXAM: MRI OF THE RIGHT KNEE WITHOUT CONTRAST TECHNIQUE: Multiplanar, multisequence MR imaging of the knee was performed. No intravenous contrast was administered. COMPARISON:  None. FINDINGS: MENISCI Medial meniscus: Small undersurface tear of the posterior horn-body junction of the medial meniscus. Lateral meniscus:  Intact. LIGAMENTS Cruciates:  Intact ACL and PCL.  Collaterals: Medial collateral ligament is intact. Lateral collateral ligament complex is intact. CARTILAGE Patellofemoral:  No chondral defect. Medial: Mild partial-thickness cartilage loss of the medial femorotibial compartment. Lateral:  No chondral defect. Joint:  No joint effusion. Normal Hoffa's fat. No plical thickening. Popliteal Fossa:  Small Baker's cyst.  Intact popliteus tendon. Extensor Mechanism: Intact quadriceps tendon. Intact patellar tendon. Intact medial patellar retinaculum. Intact lateral patellar retinaculum. Intact MPFL. Bones: Marrow edema in the periphery of medial femoral condyle with a subtle area of subchondral linear low signal peripherally concerning for a nondisplaced impaction fracture. Other: No fluid collection or hematoma. IMPRESSION: 1. Marrow edema in the periphery of medial femoral condyle with a subtle area of subchondral linear low signal peripherally concerning for a nondisplaced impaction fracture. 2. Small undersurface tear of the posterior horn-body junction of the medial meniscus. Electronically Signed   By: Hetal  Patel   On: 08/20/2018 08:12   I personally (independently) visualized and performed the interpretation of the images attached in this note.   Assessment and Plan: 53 y.o. male with right knee pain due to small medial femoral condyle impaction fracture.  Pain worsened with continued weightbearing.  Plan to proceed immediately to nonweightbearing with hinged knee brace.  I think the majority of his pain is probably due to the fracture.  He does have a small meniscus tear but he did not have much mechanical symptoms originally.  I think it is reasonable to try conservative management with nonweightbearing however his injury may be surgical.  I think it is reasonable   to have a discussion with orthopedic surgery first.  If he would benefit of surgery would rather him have it now then wait 6 weeks and then have it.  However if orthopedics does agree with  conservative management I will continue to provide care.  PDMP not reviewed this encounter. Orders Placed This Encounter  Procedures  . Ambulatory referral to Orthopedic Surgery    Referral Priority:   Routine    Referral Type:   Surgical    Referral Reason:   Specialty Services Required    Requested Specialty:   Orthopedic Surgery    Number of Visits Requested:   1   No orders of the defined types were placed in this encounter.   Follow Up Instructions:    I discussed the assessment and treatment plan with the patient. The patient was provided an opportunity to ask questions and all were answered. The patient agreed with the plan and demonstrated an understanding of the instructions.   The patient was advised to call back or seek an in-person evaluation if the symptoms worsen or if the condition fails to improve as anticipated.  Time: I personally (independently) visualized and performed the interpretation of the images attached in this note.     Historical information moved to improve visibility of documentation.  Past Medical History:  Diagnosis Date  . Depression   . Diabetes mellitus    type II  . GERD (gastroesophageal reflux disease)   . Headache(784.0)   . Hypogonadism male   . Sleep apnea    Past Surgical History:  Procedure Laterality Date  . NASAL SINUS SURGERY  04/1991  . TONSILLECTOMY  1972   Social History   Tobacco Use  . Smoking status: Never Smoker  . Smokeless tobacco: Never Used  Substance Use Topics  . Alcohol use: No   family history includes Cancer (age of onset: 65) in his father; Diabetes Mellitus I in his son; Healthy in his mother. He was adopted.  Medications: Current Outpatient Medications  Medication Sig Dispense Refill  . Alcohol Swabs (ALCOHOL PREPS) PADS Use alcohol pads to clean site to be used to check blood sugar. 100 each 11  . AMBULATORY NON FORMULARY MEDICATION Change CPAP setting to 13.5 (14 is ok if 13.5 is not  available). Please provide new machine as old machine was not fixable and Mr Bartles has improved AHI with new machine. 1 each 0  . aspirin EC 81 MG tablet Take 2 tablets (162 mg total) by mouth daily. 90 tablet 3  . Blood Glucose Monitoring Suppl (CONTOUR NEXT MONITOR) w/Device KIT 1 Device by Does not apply route daily. Use to test blood sugar daily 1 kit 1  . Blood Glucose Monitoring Suppl (FREESTYLE LITE) DEVI Use to check sugar 4 times daily. 1 each 0  . Continuous Blood Gluc Sensor (FREESTYLE LIBRE 14 DAY SENSOR) MISC 1 each by Does not apply route every 14 (fourteen) days. Change every 2 weeks 6 each 3  . diclofenac sodium (VOLTAREN) 1 % GEL Apply 4 g topically 4 (four) times daily. To affected joint. 100 g 11  . GLUCAGON EMERGENCY 1 MG injection INJECT 1 MG INTO THE MUSCLE ONCE AS NEEDED. 1 kit 3  . glucose blood (CONTOUR NEXT TEST) test strip Use as instructed to check blood sugar 5 times daily. 450 each 12  . insulin lispro (HUMALOG) 100 UNIT/ML injection INJECT up to 70 units INTO THE SKIN DAILY-in the insulin pump 30 mL 11  . Lancets (FREESTYLE)   lancets Use as instructed to check sugar 4 times daily. 400 each 5  . metFORMIN (GLUCOPHAGE-XR) 500 MG 24 hr tablet TAKE 4 TABLETS BY MOUTH DAILY in the morning 360 tablet 3  . ondansetron (ZOFRAN ODT) 4 MG disintegrating tablet Take one tab by mouth Q6hr prn nausea.  Dissolve under tongue. 12 tablet 0  . rosuvastatin (CRESTOR) 5 MG tablet TAKE 1 TABLET (5 MG TOTAL) BY MOUTH DAILY. 90 tablet 3  . Vilazodone HCl (VIIBRYD) 20 MG TABS Take 1 tablet (20 mg total) by mouth daily. 90 tablet 1  . Vitamin D, Ergocalciferol, (DRISDOL) 1.25 MG (50000 UT) CAPS capsule Take 1 capsule (50,000 Units total) by mouth every 7 (seven) days. Take for 8 total doses(weeks) 8 capsule 0   No current facility-administered medications for this visit.    Allergies  Allergen Reactions  . Cymbalta [Duloxetine Hcl] Other (See Comments)    Suicidal thoughts  . Influenza  Vaccines Other (See Comments)    Unknown reaction  . Penicillins Other (See Comments)    Unknown - childhood allergy per mother  . Pioglitazone Other (See Comments)      headache  . Promethazine Hcl Nausea And Vomiting       . Reglan [Metoclopramide] Other (See Comments)    Jittery, increased anxiety                                                                    

## 2018-08-20 NOTE — Progress Notes (Unsigned)
Virtual Visit  via Video Note  I connected with      Nathaniel [name] by a video enabled telemedicine application and verified that I am speaking with the correct person using two identifiers.   I discussed the limitations of evaluation and management by telemedicine and the availability of in person appointments. The patient expressed understanding and agreed to proceed.  History of Present Illness: Nathaniel [name] is a 53 y.o. male who would like to discuss ***   Observations/Objective: Ht 6' (1.829 m)   Wt 216 lb (98 kg)   BMI 29.29 kg/m  Wt Readings from Last 5 Encounters:  08/20/18 216 lb (98 kg)  08/07/18 215 lb (97.5 kg)  07/02/18 208 lb (94.3 kg)  04/25/18 204 lb (92.5 kg)  04/23/18 204 lb (92.5 kg)   Exam: Appearance Normal Speech.  ***  Lab and Radiology Results No results found for this or any previous visit (from the past 69 hour(s)). Mr Knee Right Wo Contrast  Result Date: 08/20/2018 CLINICAL DATA:  Right medial knee pain and weakness for 7 weeks EXAM: MRI OF THE RIGHT KNEE WITHOUT CONTRAST TECHNIQUE: Multiplanar, multisequence MR imaging of the knee was performed. No intravenous contrast was administered. COMPARISON:  None. FINDINGS: MENISCI Medial meniscus: Small undersurface tear of the posterior horn-body junction of the medial meniscus. Lateral meniscus:  Intact. LIGAMENTS Cruciates:  Intact ACL and PCL. Collaterals: Medial collateral ligament is intact. Lateral collateral ligament complex is intact. CARTILAGE Patellofemoral:  No chondral defect. Medial: Mild partial-thickness cartilage loss of the medial femorotibial compartment. Lateral:  No chondral defect. Joint:  No joint effusion. Normal Hoffa's fat. No plical thickening. Popliteal Fossa:  Small Baker's cyst.  Intact popliteus tendon. Extensor Mechanism: Intact quadriceps tendon. Intact patellar tendon. Intact medial patellar retinaculum. Intact lateral patellar retinaculum. Intact MPFL. Bones: Marrow edema  in the periphery of medial femoral condyle with a subtle area of subchondral linear low signal peripherally concerning for a nondisplaced impaction fracture. Other: No fluid collection or hematoma. IMPRESSION: 1. Marrow edema in the periphery of medial femoral condyle with a subtle area of subchondral linear low signal peripherally concerning for a nondisplaced impaction fracture. 2. Small undersurface tear of the posterior horn-body junction of the medial meniscus. Electronically Signed   By: Kathreen Devoid   On: 08/20/2018 08:12     Assessment and Plan: 53 y.o. male with ***  PDMP not reviewed this encounter. No orders of the defined types were placed in this encounter.  No orders of the defined types were placed in this encounter.   Follow Up Instructions:    I discussed the assessment and treatment plan with the patient. The patient was provided an opportunity to ask questions and all were answered. The patient agreed with the plan and demonstrated an understanding of the instructions.   The patient was advised to call back or seek an in-person evaluation if the symptoms worsen or if the condition fails to improve as anticipated.  Time: ***    Historical information moved to improve visibility of documentation.  Past Medical History:  Diagnosis Date  . Depression   . Diabetes mellitus    type II  . GERD (gastroesophageal reflux disease)   . Headache(784.0)   . Hypogonadism male   . Sleep apnea    Past Surgical History:  Procedure Laterality Date  . NASAL SINUS SURGERY  04/1991  . TONSILLECTOMY  1972   Social History   Tobacco Use  . Smoking status:  Never Smoker  . Smokeless tobacco: Never Used  Substance Use Topics  . Alcohol use: No   family history includes Cancer (age of onset: 75) in his father; Diabetes Mellitus I in his son; Healthy in his mother. He was adopted.  Medications: Current Outpatient Medications  Medication Sig Dispense Refill  . Alcohol Swabs  (ALCOHOL PREPS) PADS Use alcohol pads to clean site to be used to check blood sugar. 100 each 11  . AMBULATORY NON FORMULARY MEDICATION Change CPAP setting to 13.5 (14 is ok if 13.5 is not available). Please provide new machine as old machine was not fixable and Mr Quaintance has improved AHI with new machine. 1 each 0  . aspirin EC 81 MG tablet Take 2 tablets (162 mg total) by mouth daily. 90 tablet 3  . Blood Glucose Monitoring Suppl (CONTOUR NEXT MONITOR) w/Device KIT 1 Device by Does not apply route daily. Use to test blood sugar daily 1 kit 1  . Blood Glucose Monitoring Suppl (FREESTYLE LITE) DEVI Use to check sugar 4 times daily. 1 each 0  . Continuous Blood Gluc Sensor (FREESTYLE LIBRE 14 DAY SENSOR) MISC 1 each by Does not apply route every 14 (fourteen) days. Change every 2 weeks 6 each 3  . diclofenac sodium (VOLTAREN) 1 % GEL Apply 4 g topically 4 (four) times daily. To affected joint. 100 g 11  . GLUCAGON EMERGENCY 1 MG injection INJECT 1 MG INTO THE MUSCLE ONCE AS NEEDED. 1 kit 3  . glucose blood (CONTOUR NEXT TEST) test strip Use as instructed to check blood sugar 5 times daily. 450 each 12  . insulin lispro (HUMALOG) 100 UNIT/ML injection INJECT up to 70 units INTO THE SKIN DAILY-in the insulin pump 30 mL 11  . Lancets (FREESTYLE) lancets Use as instructed to check sugar 4 times daily. 400 each 5  . metFORMIN (GLUCOPHAGE-XR) 500 MG 24 hr tablet TAKE 4 TABLETS BY MOUTH DAILY in the morning 360 tablet 3  . ondansetron (ZOFRAN ODT) 4 MG disintegrating tablet Take one tab by mouth Q6hr prn nausea.  Dissolve under tongue. 12 tablet 0  . rosuvastatin (CRESTOR) 5 MG tablet TAKE 1 TABLET (5 MG TOTAL) BY MOUTH DAILY. 90 tablet 3  . Vilazodone HCl (VIIBRYD) 20 MG TABS Take 1 tablet (20 mg total) by mouth daily. 90 tablet 1  . Vitamin D, Ergocalciferol, (DRISDOL) 1.25 MG (50000 UT) CAPS capsule Take 1 capsule (50,000 Units total) by mouth every 7 (seven) days. Take for 8 total doses(weeks) 8 capsule 0    No current facility-administered medications for this visit.    Allergies  Allergen Reactions  . Cymbalta [Duloxetine Hcl] Other (See Comments)    Suicidal thoughts  . Influenza Vaccines Other (See Comments)    Unknown reaction  . Penicillins Other (See Comments)    Unknown - childhood allergy per mother  . Pioglitazone Other (See Comments)      headache  . Promethazine Hcl Nausea And Vomiting       . Reglan [Metoclopramide] Other (See Comments)    Jittery, increased anxiety

## 2018-08-21 ENCOUNTER — Encounter: Payer: Self-pay | Admitting: Family Medicine

## 2018-08-22 NOTE — Telephone Encounter (Signed)
Referral was sent to [facility] on 08/20/2018

## 2018-08-23 ENCOUNTER — Encounter: Payer: Self-pay | Admitting: Family Medicine

## 2018-08-26 ENCOUNTER — Other Ambulatory Visit: Payer: 59

## 2018-08-29 ENCOUNTER — Ambulatory Visit (INDEPENDENT_AMBULATORY_CARE_PROVIDER_SITE_OTHER): Payer: 59 | Admitting: Orthopaedic Surgery

## 2018-08-29 ENCOUNTER — Encounter: Payer: Self-pay | Admitting: Orthopaedic Surgery

## 2018-08-29 ENCOUNTER — Other Ambulatory Visit: Payer: Self-pay

## 2018-08-29 VITALS — Ht 72.0 in | Wt 214.0 lb

## 2018-08-29 DIAGNOSIS — M25561 Pain in right knee: Secondary | ICD-10-CM

## 2018-08-29 NOTE — Progress Notes (Signed)
Office Visit Note   Patient: Nathaniel LukesJeffrey A Losada           Date of Birth: 1966/01/04           MRN: [id number] Visit Date: 08/29/2018              Requested by: Rodolph Bongorey, Evan S, MD [address] [place],  [place] [id number] PCP: Rodolph Bongorey, Evan S, MD   Assessment & Plan: Visit Diagnoses:  1. Acute pain of right knee     Plan: Patient has small meniscal tear without sublux fragment.  He has subchondral impaction fracture injury with bone edema and I discussed him he needs his his crutches and unload his knee.  We will set him up for some physical therapy possible iontophoresis treatments.  I will recheck him again in 4 weeks.  Follow-Up Instructions: Return in about 4 weeks (around 09/26/2018).   Orders:  Orders Placed This Encounter  Procedures  . Ambulatory referral to Physical Therapy   No orders of the defined types were placed in this encounter.     Procedures: No procedures performed   Clinical Data: No additional findings.   Subjective: Chief Complaint  Patient presents with  . Right Knee - Pain    HPI 53 year old male with right knee pain with MRI 08/20/2018 that showed marrow edema in the peripheral medial femoral condyle consistent with subchondral linear low fracture peripheral impaction fracture, nondisplaced.  Also a small undersurface tear of the posterior horn at the junction of the medial meniscus without displacement.  Patient was given crutches but used them initially and then was noncompliant.  Patient thinks he might of hit his knee on the garage he also started doing some running.  Plain radiographs obtained 07/02/2018 were negative.  Review of Systems 14 point review of systems positive for depression BPH diabetes with poor control history of headaches.  Currently pain symptoms.   Objective: Vital Signs: Ht 6' (1.829 m)   Wt 214 lb (97.1 kg)   BMI 29.02 kg/m   Physical Exam Constitutional:      Appearance: He is well-developed.  HENT:   Head: Normocephalic and atraumatic.  Eyes:     Pupils: Pupils are equal, round, and reactive to light.  Neck:     Thyroid: No thyromegaly.     Trachea: No tracheal deviation.  Cardiovascular:     Rate and Rhythm: Normal rate.  Pulmonary:     Effort: Pulmonary effort is normal.     Breath sounds: No wheezing.  Abdominal:     General: Bowel sounds are normal.     Palpations: Abdomen is soft.  Skin:    General: Skin is warm and dry.     Capillary Refill: Capillary refill takes less than 2 seconds.  Neurological:     Mental Status: He is alert and oriented to person, place, and time.  Psychiatric:        Behavior: Behavior normal.        Thought Content: Thought content normal.        Judgment: Judgment normal.     Ortho Exam patient has some medial joint line tenderness tenderness of the medial femoral condyle.  Pain with the last 5 degrees of extension and pain with passive extension pressure.  Collateral ligaments are stable.  Mild crepitus with patellar tracking.  Distal pulses are palpable.  Specialty Comments:  No specialty comments available.  Imaging: No results found.   PMFS History: Patient Active Problem List  Diagnosis Date Noted  . Acute pain of right knee 08/07/2018  . GAD (generalized anxiety disorder) 07/03/2017  . Obstructive sleep apnea 06/09/2016  . Type 1 diabetes mellitus, uncontrolled ([place]) 10/02/2015  . Major depression in complete remission ([place]) 05/15/2015  . Cervical radiculitis 01/25/2015  . BPH (benign prostatic hyperplasia) 09/12/2014  . Neck pain 02/27/2013  . Chronic headaches 09/28/2011  . Seasonal and perennial allergic rhinitis 09/28/2011  . FATIGUE, CHRONIC 12/16/2008   Past Medical History:  Diagnosis Date  . Depression   . Diabetes mellitus    type II  . GERD (gastroesophageal reflux disease)   . Headache(784.0)   . Hypogonadism male   . Sleep apnea     Family History  Adopted: Yes  Problem Relation Age of Onset  .  Cancer Father 71       Prostate cancer  . Healthy Mother   . Diabetes Mellitus I Son     Past Surgical History:  Procedure Laterality Date  . NASAL SINUS SURGERY  04/1991  . TONSILLECTOMY  1972   Social History   Occupational History    Employer: [employer]    Comment: Mental Health Case Management  Tobacco Use  . Smoking status: Never Smoker  . Smokeless tobacco: Never Used  Substance and Sexual Activity  . Alcohol use: No  . Drug use: No  . Sexual activity: Not on file

## 2018-08-30 ENCOUNTER — Encounter: Payer: Self-pay | Admitting: Internal Medicine

## 2018-08-31 ENCOUNTER — Encounter: Payer: Self-pay | Admitting: Orthopaedic Surgery

## 2018-09-03 ENCOUNTER — Ambulatory Visit: Payer: 59 | Attending: Orthopaedic Surgery | Admitting: Physical Therapy

## 2018-09-03 ENCOUNTER — Other Ambulatory Visit: Payer: Self-pay

## 2018-09-03 ENCOUNTER — Encounter: Payer: Self-pay | Admitting: Family Medicine

## 2018-09-03 ENCOUNTER — Encounter: Payer: Self-pay | Admitting: Physical Therapy

## 2018-09-03 DIAGNOSIS — M25561 Pain in right knee: Secondary | ICD-10-CM | POA: Diagnosis not present

## 2018-09-03 DIAGNOSIS — R262 Difficulty in walking, not elsewhere classified: Secondary | ICD-10-CM | POA: Insufficient documentation

## 2018-09-03 DIAGNOSIS — M25661 Stiffness of right knee, not elsewhere classified: Secondary | ICD-10-CM | POA: Insufficient documentation

## 2018-09-03 DIAGNOSIS — M6281 Muscle weakness (generalized): Secondary | ICD-10-CM | POA: Diagnosis not present

## 2018-09-03 NOTE — Therapy (Signed)
[facility] [place] [address]  [address] [place], [place], [address] Phone: [phone]   Fax:  [phone]  Physical Therapy Evaluation  Patient Details  Name: Nathaniel [name] MRN: [id number] Date of Birth: 02-27-1966 Referring Provider (PT): Annell GreeningMark Yates, MD   Encounter Date: 09/03/2018  PT End of Session - 09/03/18 1303    Visit Number  1    Number of Visits  13    Date for PT Re-Evaluation  10/15/18    Authorization Type  [facility]    PT Start Time  [id number]    PT Stop Time  1053    PT Time Calculation (min)  55 min    Equipment Utilized During Treatment  Gait belt;Other (comment)   R knee hinged brace   Activity Tolerance  Patient tolerated treatment well    Behavior During Therapy  WFL for tasks assessed/performed       Past Medical History:  Diagnosis Date  . Depression   . Diabetes mellitus    type II  . GERD (gastroesophageal reflux disease)   . Headache(784.0)   . Hypogonadism male   . Sleep apnea     Past Surgical History:  Procedure Laterality Date  . NASAL SINUS SURGERY  04/1991  . TONSILLECTOMY  1972    There were no vitals filed for this visit.   Subjective Assessment - 09/03/18 1001    Subjective  Patient reports that 2 months ago he hit his R knee on something in the garage, but unsure if this was the particular mechanism of injury. MDs advised him that he has a R meniscal tear, bone bruising, and R knee fracture. Denies N/T. Pain is located over R medial knee with radiation over anterior shin. Denies falls. Reports that he has difficulty straightening the knee out, maneuvering with the crutches, and having pain. Was advised by his MD to avoid putting weight on the R foot but admits that he has not been doing this very well.    Pertinent History  HA, GERD, DM, depression    Limitations  Sitting;Lifting;Standing;Walking;House hold activities    Diagnostic tests  08/20/18 R knee MRI: Marrow edema in the periphery  of medial femoral condyle with a subtle area of subchondral linear low signal peripherally concerning for a nondisplaced impaction fracture. Small undersurface tear of the posterior horn-body junction of the medial meniscus.    Patient Stated Goals  "I just want to get back to my normal self"    Currently in Pain?  No/denies    Pain Score  0-No pain    Pain Location  Knee    Pain Orientation  Right;Medial    Pain Descriptors / Indicators  Sharp    Pain Type  Acute pain         [facility] PT Assessment - 09/03/18 1013      Assessment   Medical Diagnosis  Acute pain of R knee    Referring Provider (PT)  Annell GreeningMark Yates, MD    Onset Date/Surgical Date  07/04/18    Next MD Visit  09/26/18    Prior Therapy  Yes      Precautions   Precautions  None      Restrictions   Weight Bearing Restrictions  Yes    RLE Weight Bearing  Touchdown weight bearing      Balance Screen   Has the patient fallen in the past 6 months  No    Has the patient had a  decrease in activity level because of a fear of falling?   No    Is the patient reluctant to leave their home because of a fear of falling?   No      Home Nurse, mental healthnvironment   Living Environment  Private residence    Living Arrangements  Spouse/significant other;Children    Available Help at Discharge  Family    Type of Home  House    Home Access  Stairs to enter    Entrance Stairs-Number of Steps  5    Entrance Stairs-Rails  Right;Left    Home Layout  Two level;Able to live on main level with bedroom/bathroom    Home Equipment  Crutches;Shower seat - built in      Prior Function   Vocation  Full time employment    Vocation Requirements  getting in/out of car, lifting, stairs    Leisure  running      Cognition   Overall Cognitive Status  Within Functional Limits for tasks assessed      Observation/Other Assessments   Focus on Therapeutic Outcomes (FOTO)   Knee: 41 (59% limited, 33% predicted)      Sensation   Light Touch  Appears Intact       Coordination   Gross Motor Movements are Fluid and Coordinated  Yes      Posture/Postural Control   Posture/Postural Control  Postural limitations    Postural Limitations  Rounded Shoulders;Forward head;Posterior pelvic tilt;Weight shift left      ROM / Strength   AROM / PROM / Strength  AROM;PROM;Strength      AROM   AROM Assessment Site  Knee    Right/Left Knee  Right;Left    Right Knee Extension  8    Right Knee Flexion  107    Left Knee Extension  0    Left Knee Flexion  125      PROM   PROM Assessment Site  Knee    Right/Left Knee  Right;Left    Right Knee Extension  5    Right Knee Flexion  120    Left Knee Extension  -1    Left Knee Flexion  128      Strength   Strength Assessment Site  Hip;Knee;Ankle    Right/Left Hip  Right;Left    Right Hip Flexion  5/5    Right Hip ABduction  4+/5    Right Hip ADduction  4+/5    Left Hip Flexion  5/5    Left Hip ABduction  4+/5    Left Hip ADduction  4+/5    Right/Left Knee  Right;Left    Right Knee Flexion  4/5    Right Knee Extension  4/5    Left Knee Flexion  4+/5    Left Knee Extension  4+/5    Right/Left Ankle  Right;Left    Right Ankle Dorsiflexion  4/5    Right Ankle Plantar Flexion  --   NT   Left Ankle Dorsiflexion  4+/5    Left Ankle Plantar Flexion  4/5   heavy UE relieance     Palpation   Patella mobility  decreased superior/inferior glide on B knees, decreased M/L mobility on R LE    Palpation comment  R medial knee slightly edematous at medial jt line and TTP      Ambulation/Gait   Assistive device  Crutches   TDWBing on R LE   Gait Pattern  Step-to pattern;Step-through pattern   unstable hop-to pattern with  2 crutches   Ambulation Surface  Level;Indoor    Gait velocity  decreased    Stairs  Yes    Stair Management Technique  No rails;Step to pattern;With crutches    Number of Stairs  5    Height of Stairs  8    Gait Comments  requiring min A with 2 crutches up/down stairs with R LE TDWBing but  with tendency to put more weight through R LE d/t instability and hesitancy despite heavy VCs                Objective measurements completed on examination: See above findings.              PT Education - 09/03/18 1102    Education Details  prognosis, POC, HEP, heavy edu on crutch managament and WBing status and advised to try walker for improved stability    Person(s) Educated  Patient    Methods  Explanation;Demonstration;Tactile cues;Verbal cues;Handout    Comprehension  Verbalized understanding;Returned demonstration       PT Short Term Goals - 09/03/18 1318      PT SHORT TERM GOAL #1   Title  Patient to be independent with initial HEP.    Time  3    Period  Weeks    Status  New    Target Date  09/24/18        PT Long Term Goals - 09/03/18 1319      PT LONG TERM GOAL #1   Title  Patient to be independent with advanced HEP.    Time  6    Period  Weeks    Status  New    Target Date  10/15/18      PT LONG TERM GOAL #2   Title  Patient to demonstrate symmetrical and nonpainful R knee AROM/PROM to opposite extremity.    Time  6    Period  Weeks    Status  New    Target Date  10/15/18      PT LONG TERM GOAL #3   Title  Patient to demonstrate B LE strength >=4+/5.    Time  6    Period  Weeks    Status  New    Target Date  10/15/18      PT LONG TERM GOAL #4   Title  Patient to demonstrate and recall correct WBing precautions with ambulation and transfers.    Time  6    Period  Weeks    Status  New    Target Date  10/15/18      PT LONG TERM GOAL #5   Title  Patient to perform R SLR without quad lag.    Time  6    Period  Weeks    Status  New    Target Date  10/15/18             Plan - 09/03/18 1304    Clinical Impression Statement  Patient is a 53y/o M presenting to OPPT with c/o R knee pain. Recent MRI revealed medial femoral impaction fracture and medial meniscus tear. Patient arrived to session ambulating with 2 crutches;  noncompliant with TDWBing precautions and considerably unstable when attempting to maintain WBing precautions. Patient reporting that pain is located over R medial knee with radiation over anterior shin. Reports that he has difficulty attaining full knee extension and using the crutches. Patient would like to return to running. Patient today with limited R knee ROM, decreased  LE strength, limited R patellar mobility, and considerable difficulty and instability when ambulating with crutches. D/t patient's instability and continued noncompliance with WBing precautions after gait and stair training with crutches, patient may better benefit from using another AD. Patient reporting that he may be able to borrow a walker. Educated patient on gentle strengthening and ROM HEP- patient reported understanding. Would benefit from skilled PT services 2x/week for 6 weeks to address aforementioned impairments.    Personal Factors and Comorbidities  Behavior Pattern;Comorbidity 3+;Time since onset of injury/illness/exacerbation;Fitness;Profession    Comorbidities  HA, GERD, DM, depression    Examination-Activity Limitations  Bathing;Sleep;Bend;Squat;Stairs;Stand;Carry;Transfers;Lift;Locomotion Level;Reach Overhead;Dressing;Toileting    Examination-Participation Restrictions  Cleaning;Shop;Community Activity;Driving;Yard Work;Interpersonal Relationship;Laundry;Meal Prep    Stability/Clinical Decision Making  Stable/Uncomplicated    Clinical Decision Making  Moderate    Rehab Potential  Good    PT Frequency  2x / week    PT Duration  6 weeks    PT Treatment/Interventions  ADLs/Self Care Home Management;Cryotherapy;Electrical Stimulation;Iontophoresis 4mg /ml Dexamethasone;Moist Heat;Balance training;Therapeutic exercise;Therapeutic activities;Functional mobility training;Stair training;Gait training;DME Instruction;Ultrasound;Neuromuscular re-education;Patient/family education;Orthotic Fit/Training;Manual  techniques;Vasopneumatic Device;Taping;Splinting;Energy conservation;Dry needling;Passive range of motion    PT Next Visit Plan  reassess HEP and gait/stair training with crutches and 4WW/2WW    Consulted and Agree with Plan of Care  Patient       Patient will benefit from skilled therapeutic intervention in order to improve the following deficits and impairments:  Hypomobility, Increased edema, Decreased knowledge of precautions, Decreased activity tolerance, Decreased strength, Pain, Difficulty walking, Decreased mobility, Decreased balance, Decreased range of motion, Improper body mechanics, Postural dysfunction, Impaired flexibility, Decreased safety awareness  Visit Diagnosis: Acute pain of right knee  Stiffness of right knee, not elsewhere classified  Muscle weakness (generalized)  Difficulty in walking, not elsewhere classified     Problem List Patient Active Problem List   Diagnosis Date Noted  . Acute pain of right knee 08/07/2018  . GAD (generalized anxiety disorder) 07/03/2017  . Obstructive sleep apnea 06/09/2016  . Type 1 diabetes mellitus, uncontrolled ([place]) 10/02/2015  . Major depression in complete remission ([place]) 05/15/2015  . Cervical radiculitis 01/25/2015  . BPH (benign prostatic hyperplasia) 09/12/2014  . Neck pain 02/27/2013  . Chronic headaches 09/28/2011  . Seasonal and perennial allergic rhinitis 09/28/2011  . FATIGUE, CHRONIC 12/16/2008    Janene Harvey, PT, DPT 09/03/18 1:25 PM    [facility] [address]  [place], [place], [address] Phone: [phone]   Fax:  [phone]  Name: Nathaniel [name] MRN: [id number] Date of Birth: 1965-08-05

## 2018-09-04 ENCOUNTER — Other Ambulatory Visit: Payer: Self-pay | Admitting: Orthopaedic Surgery

## 2018-09-04 ENCOUNTER — Encounter: Payer: Self-pay | Admitting: Orthopaedic Surgery

## 2018-09-04 ENCOUNTER — Ambulatory Visit: Payer: 59 | Admitting: Family Medicine

## 2018-09-04 MED ORDER — AMBULATORY NON FORMULARY MEDICATION: 0 refills | Status: DC

## 2018-09-04 MED ORDER — ACETAMINOPHEN-CODEINE #3 300-30 MG PO TABS: 1.0000 | ORAL_TABLET | Freq: Three times a day (TID) | ORAL | 0 refills | Status: DC | PRN

## 2018-09-04 MED FILL — ACETAMINOPHEN/COD #3 TABLET: 300-30 | 5 days supply | Qty: 30 | Fill #0

## 2018-09-04 NOTE — Progress Notes (Unsigned)
   Office Visit Note   Patient: Nathaniel [name]           Date of Birth: [birth year]           MRN: [id number] Visit Date: 09/04/2018              Requested by: No referring provider defined for this encounter. PCP: Gregor Hams, MD   Assessment & Plan: Visit Diagnoses: No diagnosis found.  Plan: ***  Follow-Up Instructions: No follow-ups on file.   Orders:  No orders of the defined types were placed in this encounter.  Meds ordered this encounter  Medications  . acetaminophen-codeine (TYLENOL #3) 300-30 MG tablet    Sig: Take 1-2 tablets by mouth every 8 (eight) hours as needed for moderate pain.    Dispense:  30 tablet    Refill:  0      Procedures: No procedures performed   Clinical Data: No additional findings.   Subjective: No chief complaint on file.   HPI  Review of Systems   Objective: Vital Signs: There were no vitals taken for this visit.  Physical Exam  Ortho Exam  Specialty Comments:  No specialty comments available.  Imaging: No results found.   PMFS History: Patient Active Problem List   Diagnosis Date Noted  . Acute pain of right knee 08/07/2018  . GAD (generalized anxiety disorder) 07/03/2017  . Obstructive sleep apnea 06/09/2016  . Type 1 diabetes mellitus, uncontrolled ([place]) 10/02/2015  . Major depression in complete remission ([place]) 05/15/2015  . Cervical radiculitis 01/25/2015  . BPH (benign prostatic hyperplasia) 09/12/2014  . Neck pain 02/27/2013  . Chronic headaches 09/28/2011  . Seasonal and perennial allergic rhinitis 09/28/2011  . FATIGUE, CHRONIC 12/16/2008   Past Medical History:  Diagnosis Date  . Depression   . Diabetes mellitus    type II  . GERD (gastroesophageal reflux disease)   . Headache(784.0)   . Hypogonadism male   . Sleep apnea     Family History  Adopted: Yes  Problem Relation Age of Onset  . Cancer Father 56       Prostate cancer  . Healthy Mother   . Diabetes Mellitus I Son     Past  Surgical History:  Procedure Laterality Date  . NASAL SINUS SURGERY  04/1991  . TONSILLECTOMY  1972   Social History   Occupational History    Employer: [employer]    Comment: Mental Health Case Management  Tobacco Use  . Smoking status: Never Smoker  . Smokeless tobacco: Never Used  Substance and Sexual Activity  . Alcohol use: No  . Drug use: No  . Sexual activity: Not on file

## 2018-09-07 ENCOUNTER — Other Ambulatory Visit: Payer: Self-pay

## 2018-09-07 ENCOUNTER — Ambulatory Visit: Payer: 59

## 2018-09-07 DIAGNOSIS — M25561 Pain in right knee: Secondary | ICD-10-CM

## 2018-09-07 DIAGNOSIS — R262 Difficulty in walking, not elsewhere classified: Secondary | ICD-10-CM

## 2018-09-07 DIAGNOSIS — M6281 Muscle weakness (generalized): Secondary | ICD-10-CM

## 2018-09-07 DIAGNOSIS — M25661 Stiffness of right knee, not elsewhere classified: Secondary | ICD-10-CM

## 2018-09-07 NOTE — Therapy (Signed)
[facility] [address]  [place], [place], [address] Phone: [phone]   Fax:  [phone]  Physical Therapy Treatment  Patient Details  Name: Nathaniel [name] MRN: [id number] Date of Birth: [birth year] Referring Provider (PT): Rodell Perna, MD   Encounter Date: 09/07/2018  PT End of Session - 09/07/18 1156    Visit Number  2    Number of Visits  13    Date for PT Re-Evaluation  10/15/18    Authorization Type  [facility]    PT Start Time  1100    PT Stop Time  1148    PT Time Calculation (min)  48 min    Equipment Utilized During Treatment  Gait belt;Other (comment)   R knee hinged brace   Activity Tolerance  Patient tolerated treatment well    Behavior During Therapy  WFL for tasks assessed/performed       Past Medical History:  Diagnosis Date  . Depression   . Diabetes mellitus    type II  . GERD (gastroesophageal reflux disease)   . Headache(784.0)   . Hypogonadism male   . Sleep apnea     Past Surgical History:  Procedure Laterality Date  . NASAL SINUS SURGERY  04/1991  . TONSILLECTOMY  1972    There were no vitals filed for this visit.  Subjective Assessment - 09/07/18 1217    Subjective  Pt. reporting he may have to visit a client who does note have hand rails on either side of their front porch stairs in next few days.  Apprehensive about navigating stairs with AD.    Pertinent History  HA, GERD, DM, depression    Diagnostic tests  08/20/18 R knee MRI: Marrow edema in the periphery of medial femoral condyle with a subtle area of subchondral linear low signal peripherally concerning for a nondisplaced impaction fracture. Small undersurface tear of the posterior horn-body junction of the medial meniscus.    Patient Stated Goals  "I just want to get back to my normal self"    Currently in Pain?  No/denies    Pain Score  0-No pain    Multiple Pain Sites  No                       [facility]  Adult PT Treatment/Exercise - 09/07/18 0001      Transfers   Transfers  Sit to Stand;Stand to Sit   cues provided for proper hand placement to prevent R WB     Ambulation/Gait   Stairs  Yes    Stairs Assistance  5: Supervision    Stairs Assistance Details (indicate cue type and reason)  Cues required for hand placement with RW, RW positioning, cues to prevent UE pushoff from RW lower handle to prevent flipping RW, Cues for slow pacing     Stair Management Technique  Step to pattern;With crutches;One rail Left;Alternating pattern    Number of Stairs  5    Height of Stairs  8      Knee/Hip Exercises: Stretches   Sports administrator  Right;1 rep;30 seconds    Quad Stretch Limitations  prone strap    Hip Flexor Stretch  Right;1 rep;30 seconds    Hip Flexor Stretch Limitations  mod thomas position with strap      Knee/Hip Exercises: Supine   Quad Sets  Right;1 set;10 reps;Strengthening    Quad Sets Limitations  5" hold     Short  Arc [employer]Quad Sets  Right;1 set;10 reps    Short [employer] Quad Sets Limitations  5" hold; 8" bolster     Heel Slides  Right;10 reps    Heel Slides Limitations  with strap assistance     Straight Leg Raises  Right;10 reps;Strengthening    Straight Leg Raises Limitations  cues for quad set prior to each rep             PT Education - 09/07/18 1155    Person(s) Educated  Patient    Methods  Explanation;Demonstration;Verbal cues;Handout    Comprehension  Verbalized understanding;Returned demonstration;Verbal cues required       PT Short Term Goals - 09/07/18 1215      PT SHORT TERM GOAL #1   Title  Patient to be independent with initial HEP.    Time  3    Period  Weeks    Status  On-going    Target Date  09/24/18        PT Long Term Goals - 09/07/18 1215      PT LONG TERM GOAL #1   Title  Patient to be independent with advanced HEP.    Time  6    Period  Weeks    Status  On-going      PT LONG TERM GOAL #2   Title  Patient to demonstrate symmetrical and  nonpainful R knee AROM/PROM to opposite extremity.    Time  6    Period  Weeks    Status  On-going      PT LONG TERM GOAL #3   Title  Patient to demonstrate B LE strength >=4+/5.    Time  6    Period  Weeks    Status  On-going      PT LONG TERM GOAL #4   Title  Patient to demonstrate and recall correct WBing precautions with ambulation and transfers.    Time  6    Period  Weeks    Status  On-going      PT LONG TERM GOAL #5   Title  Patient to perform R SLR without quad lag.    Time  6    Period  Weeks    Status  On-going            Plan - 09/07/18 1211    Clinical Impression Statement  Session focused on gait training with crutches and 2W RW today for increased safety as pt. demonstrating poor technique and safety with B axillary crutches.  Pt. encouraged to slow pacing, and for proper hand placement, weight shift, and AD spacing from BOS for improved safety however will likely require further skilled instruction for carryover.  Also reviewed HEP requiring cueing for TKE with quad strengthening activities.  Ended session with pt. pain-free.  Will continue to progress toward goals.    Personal Factors and Comorbidities  Behavior Pattern;Comorbidity 3+;Time since onset of injury/illness/exacerbation;Fitness;Profession    Comorbidities  HA, GERD, DM, depression    Rehab Potential  Good    PT Treatment/Interventions  ADLs/Self Care Home Management;Cryotherapy;Electrical Stimulation;Iontophoresis 4mg /ml Dexamethasone;Moist Heat;Balance training;Therapeutic exercise;Therapeutic activities;Functional mobility training;Stair training;Gait training;DME Instruction;Ultrasound;Neuromuscular re-education;Patient/family education;Orthotic Fit/Training;Manual techniques;Vasopneumatic Device;Taping;Splinting;Energy conservation;Dry needling;Passive range of motion    PT Next Visit Plan  Gait/stair training with crutches and 4WW/2WW prn    Consulted and Agree with Plan of Care  Patient        Patient will benefit from skilled therapeutic intervention in order to improve the following deficits and  impairments:  Hypomobility, Increased edema, Decreased knowledge of precautions, Decreased activity tolerance, Decreased strength, Pain, Difficulty walking, Decreased mobility, Decreased balance, Decreased range of motion, Improper body mechanics, Postural dysfunction, Impaired flexibility, Decreased safety awareness  Visit Diagnosis: 1. Acute pain of right knee   2. Stiffness of right knee, not elsewhere classified   3. Muscle weakness (generalized)   4. Difficulty in walking, not elsewhere classified        Problem List Patient Active Problem List   Diagnosis Date Noted  . Acute pain of right knee 08/07/2018  . GAD (generalized anxiety disorder) 07/03/2017  . Obstructive sleep apnea 06/09/2016  . Type 1 diabetes mellitus, uncontrolled (HCC) 10/02/2015  . Major depression in complete remission (HCC) 05/15/2015  . Cervical radiculitis 01/25/2015  . BPH (benign prostatic hyperplasia) 09/12/2014  . Neck pain 02/27/2013  . Chronic headaches 09/28/2011  . Seasonal and perennial allergic rhinitis 09/28/2011  . FATIGUE, CHRONIC 12/16/2008    Kermit BaloMicah Ellington [place], PTA 09/07/18 12:18 PM   [facility] [address]  [address] [place], [place], [address] Phone: [phone]   Fax:  [phone]  Name: Mosie LukesJeffrey A Gargan MRN: [id number] Date of Birth: 05/25/1965

## 2018-09-08 ENCOUNTER — Encounter: Payer: Self-pay | Admitting: Internal Medicine

## 2018-09-12 ENCOUNTER — Encounter: Payer: Self-pay | Admitting: Physical Therapy

## 2018-09-12 ENCOUNTER — Encounter: Payer: Self-pay | Admitting: Orthopaedic Surgery

## 2018-09-12 ENCOUNTER — Other Ambulatory Visit: Payer: Self-pay

## 2018-09-12 ENCOUNTER — Ambulatory Visit: Payer: 59 | Admitting: Physical Therapy

## 2018-09-12 DIAGNOSIS — M25561 Pain in right knee: Secondary | ICD-10-CM | POA: Diagnosis not present

## 2018-09-12 DIAGNOSIS — R262 Difficulty in walking, not elsewhere classified: Secondary | ICD-10-CM | POA: Diagnosis not present

## 2018-09-12 DIAGNOSIS — M6281 Muscle weakness (generalized): Secondary | ICD-10-CM

## 2018-09-12 DIAGNOSIS — M25661 Stiffness of right knee, not elsewhere classified: Secondary | ICD-10-CM | POA: Diagnosis not present

## 2018-09-12 NOTE — Therapy (Signed)
[facility] [address]  [address] [place], [place], [address] Phone: [phone]   Fax:  [phone]  Physical Therapy Treatment  Patient Details  Name: Nathaniel [name] MRN: [id number] Date of Birth: 1965/06/16 Referring Provider (PT): Annell GreeningMark Yates, MD   Encounter Date: 09/12/2018  PT End of Session - 09/12/18 1044    Visit Number  3    Number of Visits  13    Date for PT Re-Evaluation  10/15/18    Authorization Type  [facility]    PT Start Time  0954    PT Stop Time  1040    PT Time Calculation (min)  46 min    Equipment Utilized During Treatment  Other (comment)   R knee hinged brace   Activity Tolerance  Patient tolerated treatment well    Behavior During Therapy  [facility] for tasks assessed/performed       Past Medical History:  Diagnosis Date  . Depression   . Diabetes mellitus    type II  . GERD (gastroesophageal reflux disease)   . Headache(784.0)   . Hypogonadism male   . Sleep apnea     Past Surgical History:  Procedure Laterality Date  . NASAL SINUS SURGERY  04/1991  . TONSILLECTOMY  1972    There were no vitals filed for this visit.  Subjective Assessment - 09/12/18 0955    Subjective  Reports that he has been pretty much pain free except when he was trying to put his shoes on this AM. Noticing pain on the L side of his neck lately. Feels comfortable going up/down stairs with the crutches now.    Pertinent History  HA, GERD, DM, depression    Diagnostic tests  08/20/18 R knee MRI: Marrow edema in the periphery of medial femoral condyle with a subtle area of subchondral linear low signal peripherally concerning for a nondisplaced impaction fracture. Small undersurface tear of the posterior horn-body junction of the medial meniscus.    Patient Stated Goals  "I just want to get back to my normal self"    Currently in Pain?  No/denies         [facility] Assessment - 09/12/18 0001      AROM   Right Knee  Extension  5    Right Knee Flexion  125      PROM   Right Knee Extension  2    Right Knee Flexion  127                   [facility] Adult PT Treatment/Exercise - 09/12/18 0001      Exercises   Exercises  Knee/Hip      Knee/Hip Exercises: Stretches   Passive Hamstring Stretch  Right;2 reps;30 seconds    Passive Hamstring Stretch Limitations  supine with strap    Hip Flexor Stretch  Right;30 seconds;2 reps    Hip Flexor Stretch Limitations  mod thomas position with strap   no pain     Knee/Hip Exercises: Supine   Quad Sets  Right;1 set;10 reps;Strengthening    Quad Sets Limitations  10x10" with towel roll    Short Arc Quad Sets  Right;1 set;15 reps    Short Arc Quad Sets Limitations  15x3" with ball under knee    Heel Slides  Right;10 reps    Heel Slides Limitations  10x3" with strap and orange pball; cues to avoid pushing into pain    Straight Leg Raises  Right;10 reps;Strengthening    Straight Leg Raises Limitations  cues for TKE    Straight Leg Raise with External Rotation  Strengthening;Right;1 set;10 reps    Straight Leg Raise with External Rotation Limitations  cues for TKE      Knee/Hip Exercises: Sidelying   Hip ABduction  Strengthening;Right;1 set;10 reps    Hip ABduction Limitations  cues for TKE and alignment    Hip ADduction  Strengthening;Right;1 set;10 reps    Hip ADduction Limitations  opposite LE propped on bolster   able to complete without R knee pain   Clams  x10 each LE with manual cues to avoid trunk rotation             PT Education - 09/12/18 1043    Education Details  update to HEP    Person(s) Educated  Patient    Methods  Explanation;Demonstration;Tactile cues;Verbal cues;Handout    Comprehension  Verbalized understanding;Returned demonstration       PT Short Term Goals - 09/07/18 1215      PT SHORT TERM GOAL #1   Title  Patient to be independent with initial HEP.    Time  3    Period  Weeks    Status  On-going    Target  Date  09/24/18        PT Long Term Goals - 09/07/18 1215      PT LONG TERM GOAL #1   Title  Patient to be independent with advanced HEP.    Time  6    Period  Weeks    Status  On-going      PT LONG TERM GOAL #2   Title  Patient to demonstrate symmetrical and nonpainful R knee AROM/PROM to opposite extremity.    Time  6    Period  Weeks    Status  On-going      PT LONG TERM GOAL #3   Title  Patient to demonstrate B LE strength >=4+/5.    Time  6    Period  Weeks    Status  On-going      PT LONG TERM GOAL #4   Title  Patient to demonstrate and recall correct WBing precautions with ambulation and transfers.    Time  6    Period  Weeks    Status  On-going      PT LONG TERM GOAL #5   Title  Patient to perform R SLR without quad lag.    Time  6    Period  Weeks    Status  On-going            Plan - 09/12/18 1044    Clinical Impression Statement  Patient arrived to session, ambulating with 2 axillary crutches, with R knee NWBing. Reporting improvement in comfort maneuvering stairs with crutches; today declining review of stairs. Visible and nontender bruising in R posterolateral knee today- patient unsure of cause of this. Still demonstrating lack of TKE with quad strengthening ther-ex. Patient with observable improvement in R knee flexion with heel slides. Measured AROM 5-125 degrees, and PROM 2-127 degrees without pain, indicating good improvement since initial eval. Introduced hip strengthening with cues for proper form. Patient reporting difficulty with sidelying hip adduction on L LE, but able to complete this exercise pain-free. Requesting update to HEP, thus added clamshells and sidelying hip adduction. Patient reported understanding. Patient without pain at end of session.    Comorbidities  HA, GERD, DM, depression    PT Treatment/Interventions  ADLs/Self Care Home Management;Cryotherapy;Electrical Stimulation;Iontophoresis 4mg /ml Dexamethasone;Moist Heat;Balance  training;Therapeutic exercise;Therapeutic activities;Functional mobility training;Stair training;Gait training;DME Instruction;Ultrasound;Neuromuscular re-education;Patient/family education;Orthotic Fit/Training;Manual techniques;Vasopneumatic Device;Taping;Splinting;Energy conservation;Dry needling;Passive range of motion    PT Next Visit Plan  LE strengthening therex; Gait/stair training with crutches and 4WW/2WW prn    Consulted and Agree with Plan of Care  Patient       Patient will benefit from skilled therapeutic intervention in order to improve the following deficits and impairments:  Hypomobility, Increased edema, Decreased knowledge of precautions, Decreased activity tolerance, Decreased strength, Pain, Difficulty walking, Decreased mobility, Decreased balance, Decreased range of motion, Improper body mechanics, Postural dysfunction, Impaired flexibility, Decreased safety awareness  Visit Diagnosis: 1. Acute pain of right knee   2. Stiffness of right knee, not elsewhere classified   3. Muscle weakness (generalized)   4. Difficulty in walking, not elsewhere classified        Problem List Patient Active Problem List   Diagnosis Date Noted  . Acute pain of right knee 08/07/2018  . GAD (generalized anxiety disorder) 07/03/2017  . Obstructive sleep apnea 06/09/2016  . Type 1 diabetes mellitus, uncontrolled ([place]) 10/02/2015  . Major depression in complete remission ([place]) 05/15/2015  . Cervical radiculitis 01/25/2015  . BPH (benign prostatic hyperplasia) 09/12/2014  . Neck pain 02/27/2013  . Chronic headaches 09/28/2011  . Seasonal and perennial allergic rhinitis 09/28/2011  . FATIGUE, CHRONIC 12/16/2008    Janene Harvey, PT, DPT 09/12/18 10:47 AM    [facility] [address]  [place], [place], [address] Phone: [phone]   Fax:  [phone]  Name: Nathaniel [name] MRN: [id number] Date of Birth:  1965/07/17

## 2018-09-13 ENCOUNTER — Encounter: Payer: Self-pay | Admitting: Internal Medicine

## 2018-09-13 ENCOUNTER — Encounter: Payer: Self-pay | Admitting: Family Medicine

## 2018-09-13 ENCOUNTER — Ambulatory Visit: Payer: 59

## 2018-09-13 DIAGNOSIS — E1065 Type 1 diabetes mellitus with hyperglycemia: Secondary | ICD-10-CM

## 2018-09-14 ENCOUNTER — Other Ambulatory Visit: Payer: Self-pay

## 2018-09-14 ENCOUNTER — Ambulatory Visit: Payer: 59

## 2018-09-14 DIAGNOSIS — R262 Difficulty in walking, not elsewhere classified: Secondary | ICD-10-CM | POA: Diagnosis not present

## 2018-09-14 DIAGNOSIS — M25661 Stiffness of right knee, not elsewhere classified: Secondary | ICD-10-CM

## 2018-09-14 DIAGNOSIS — M6281 Muscle weakness (generalized): Secondary | ICD-10-CM

## 2018-09-14 DIAGNOSIS — M25561 Pain in right knee: Secondary | ICD-10-CM | POA: Diagnosis not present

## 2018-09-14 NOTE — Therapy (Signed)
[facility] [place] [facility] [address]  [address] [place], [place], [address] Phone: [phone]   Fax:  [phone]  Physical Therapy Treatment  Patient Details  Name: Nathaniel [name] MRN: [id number] Date of Birth: [birth year] Referring Provider (PT): Annell GreeningMark Yates, MD   Encounter Date: 09/14/2018  PT End of Session - 09/14/18 1106    Visit Number  4    Number of Visits  13    Date for PT Re-Evaluation  10/15/18    Authorization Type  [facility]    PT Start Time  1100    PT Stop Time  1141    PT Time Calculation (min)  41 min    Equipment Utilized During Treatment  Other (comment)   R knee hinged brace   Activity Tolerance  Patient tolerated treatment well    Behavior During Therapy  [facility] for tasks assessed/performed       Past Medical History:  Diagnosis Date  . Depression   . Diabetes mellitus    type II  . GERD (gastroesophageal reflux disease)   . Headache(784.0)   . Hypogonadism male   . Sleep apnea     Past Surgical History:  Procedure Laterality Date  . NASAL SINUS SURGERY  04/1991  . TONSILLECTOMY  1972    There were no vitals filed for this visit.  Subjective Assessment - 09/14/18 1105    Subjective  Pt. noting ~ 98% improvement in pain levels since starting therapy.    Pertinent History  HA, GERD, DM, depression    Diagnostic tests  08/20/18 R knee MRI: Marrow edema in the periphery of medial femoral condyle with a subtle area of subchondral linear low signal peripherally concerning for a nondisplaced impaction fracture. Small undersurface tear of the posterior horn-body junction of the medial meniscus.    Patient Stated Goals  "I just want to get back to my normal self"         [facility] - 09/14/18 0001      Assessment   Medical Diagnosis  Acute pain of R knee    Referring Provider (PT)  Annell GreeningMark Yates, MD    Onset Date/Surgical Date  07/04/18    Hand Dominance  Left    Next MD Visit  09/26/18                    [facility] Adult PT Treatment/Exercise - 09/14/18 0001      Knee/Hip Exercises: Stretches   Passive Hamstring Stretch  Right;30 seconds;1 rep    Passive Hamstring Stretch Limitations  supine with strap    Hip Flexor Stretch  Right;30 seconds;1 rep    Hip Flexor Stretch Limitations  mod thomas position with strap      Knee/Hip Exercises: Standing   Hip Abduction  Right   x 12 resp    Abduction Limitations  chair support on L UE    Hip Extension  Right   x 12 reps    Extension Limitations  chair support on L UE       Knee/Hip Exercises: Seated   Long Arc Quad  Right      Knee/Hip Exercises: Supine   Quad Sets  Right;15 reps;Strengthening    Quad Sets Limitations  5" hold into pillow under heel     Straight Leg Raises  Right;Strengthening   x 12 reps    Straight Leg Raises Limitations  Cues for proper arc of movement    Min cues  required for TKE     Knee/Hip Exercises: Sidelying   Hip ABduction  Right   x 12 reps   Hip ABduction Limitations  cues to avoid hip flexion     Hip ADduction  Right   x 12 reps    Hip ADduction Limitations  cues for positioning     Clams  x 12 rpes       Knee/Hip Exercises: Prone   Hip Extension  Right   x 12 reps    Hip Extension Limitations  diffuculty             PT Education - 09/14/18 1147    Education Details  HEP update    Person(s) Educated  Patient    Methods  Explanation;Verbal cues;Handout    Comprehension  Verbalized understanding;Returned demonstration;Verbal cues required       PT Short Term Goals - 09/14/18 1135      PT SHORT TERM GOAL #1   Title  Patient to be independent with initial HEP.    Time  3    Period  Weeks    Status  Achieved    Target Date  09/24/18        PT Long Term Goals - 09/07/18 1215      PT LONG TERM GOAL #1   Title  Patient to be independent with advanced HEP.    Time  6    Period  Weeks    Status  On-going      PT LONG TERM GOAL #2   Title  Patient to  demonstrate symmetrical and nonpainful R knee AROM/PROM to opposite extremity.    Time  6    Period  Weeks    Status  On-going      PT LONG TERM GOAL #3   Title  Patient to demonstrate B LE strength >=4+/5.    Time  6    Period  Weeks    Status  On-going      PT LONG TERM GOAL #4   Title  Patient to demonstrate and recall correct WBing precautions with ambulation and transfers.    Time  6    Period  Weeks    Status  On-going      PT LONG TERM GOAL #5   Title  Patient to perform R SLR without quad lag.    Time  6    Period  Weeks    Status  On-going            Plan - 09/14/18 1105    Clinical Impression Statement  Pt. reporting ~ 98% improvement in pain levels since starting therapy.  Pt. able to achieve STG #1 today now independent with home program.  Tolerated all LE strengthening activities in session well today focused on quad strengthening and improving R knee extension ROM.  Progressing well toward goals.    Personal Factors and Comorbidities  Behavior Pattern;Comorbidity 3+;Time since onset of injury/illness/exacerbation;Fitness;Profession    Comorbidities  HA, GERD, DM, depression    Rehab Potential  Good    PT Treatment/Interventions  ADLs/Self Care Home Management;Cryotherapy;Electrical Stimulation;Iontophoresis 4mg /ml Dexamethasone;Moist Heat;Balance training;Therapeutic exercise;Therapeutic activities;Functional mobility training;Stair training;Gait training;DME Instruction;Ultrasound;Neuromuscular re-education;Patient/family education;Orthotic Fit/Training;Manual techniques;Vasopneumatic Device;Taping;Splinting;Energy conservation;Dry needling;Passive range of motion    PT Next Visit Plan  LE strengthening therex; Gait/stair training with crutches and 4WW/2WW prn    Consulted and Agree with Plan of Care  Patient       Patient will benefit from skilled therapeutic intervention in order  to improve the following deficits and impairments:  Hypomobility, Increased  edema, Decreased knowledge of precautions, Decreased activity tolerance, Decreased strength, Pain, Difficulty walking, Decreased mobility, Decreased balance, Decreased range of motion, Improper body mechanics, Postural dysfunction, Impaired flexibility, Decreased safety awareness  Visit Diagnosis: 1. Acute pain of right knee   2. Stiffness of right knee, not elsewhere classified   3. Muscle weakness (generalized)   4. Difficulty in walking, not elsewhere classified        Problem List Patient Active Problem List   Diagnosis Date Noted  . Acute pain of right knee 08/07/2018  . GAD (generalized anxiety disorder) 07/03/2017  . Obstructive sleep apnea 06/09/2016  . Type 1 diabetes mellitus, uncontrolled ([place]) 10/02/2015  . Major depression in complete remission ([place]) 05/15/2015  . Cervical radiculitis 01/25/2015  . BPH (benign prostatic hyperplasia) 09/12/2014  . Neck pain 02/27/2013  . Chronic headaches 09/28/2011  . Seasonal and perennial allergic rhinitis 09/28/2011  . FATIGUE, CHRONIC 12/16/2008    Bess Harvest, PTA 09/14/18 11:57 AM   [facility] [address]  [place], [place], [address] Phone: [phone]   Fax:  [phone]  Name: Nathaniel [name] MRN: [id number] Date of Birth: 11/12/1965

## 2018-09-17 ENCOUNTER — Other Ambulatory Visit: Payer: Self-pay

## 2018-09-17 ENCOUNTER — Ambulatory Visit: Payer: 59 | Admitting: Physical Therapy

## 2018-09-17 ENCOUNTER — Encounter: Payer: Self-pay | Admitting: Orthopaedic Surgery

## 2018-09-17 ENCOUNTER — Telehealth: Payer: Self-pay | Admitting: Orthopaedic Surgery

## 2018-09-17 ENCOUNTER — Encounter: Payer: Self-pay | Admitting: Physical Therapy

## 2018-09-17 DIAGNOSIS — M6281 Muscle weakness (generalized): Secondary | ICD-10-CM | POA: Diagnosis not present

## 2018-09-17 DIAGNOSIS — M25661 Stiffness of right knee, not elsewhere classified: Secondary | ICD-10-CM

## 2018-09-17 DIAGNOSIS — R262 Difficulty in walking, not elsewhere classified: Secondary | ICD-10-CM | POA: Diagnosis not present

## 2018-09-17 DIAGNOSIS — M25561 Pain in right knee: Secondary | ICD-10-CM

## 2018-09-17 NOTE — Telephone Encounter (Signed)
My Chart message sent back to patient to advise.

## 2018-09-17 NOTE — Telephone Encounter (Signed)
Please advise 

## 2018-09-17 NOTE — Telephone Encounter (Signed)
Ok as long as not taking pain meds during the day. If he takes tylenol # 3 at night , OK to drive during day. ucall thanks

## 2018-09-17 NOTE — Therapy (Signed)
[facility] [address]  [place], [place], [address] Phone: [phone]   Fax:  [phone]  Physical Therapy Treatment  Patient Details  Name: Nathaniel [name] MRN: [id number] Date of Birth: June 30, 1965 Referring Provider (PT): Rodell Perna, MD   Encounter Date: 09/17/2018  PT End of Session - 09/17/18 1148    Visit Number  5    Number of Visits  13    Date for PT Re-Evaluation  10/15/18    Authorization Type  [facility]    PT Start Time  1103    PT Stop Time  1142    PT Time Calculation (min)  39 min    Equipment Utilized During Treatment  Other (comment)   2 axillary crutches   Activity Tolerance  Patient tolerated treatment well    Behavior During Therapy  [facility] for tasks assessed/performed       Past Medical History:  Diagnosis Date  . Depression   . Diabetes mellitus    type II  . GERD (gastroesophageal reflux disease)   . Headache(784.0)   . Hypogonadism male   . Sleep apnea     Past Surgical History:  Procedure Laterality Date  . NASAL SINUS SURGERY  04/1991  . TONSILLECTOMY  1972    There were no vitals filed for this visit.  Subjective Assessment - 09/17/18 1058    Subjective  Reports that he had pain in R knee while driving over the weekend and it lasted a couple hours. Patient not sure if he is supposed to be driving or not. Has some LBP, but not during any of his exercises.    Pertinent History  HA, GERD, DM, depression    Diagnostic tests  08/20/18 R knee MRI: Marrow edema in the periphery of medial femoral condyle with a subtle area of subchondral linear low signal peripherally concerning for a nondisplaced impaction fracture. Small undersurface tear of the posterior horn-body junction of the medial meniscus.    Patient Stated Goals  "I just want to get back to my normal self"    Currently in Pain?  No/denies                       [facility] Adult PT Treatment/Exercise - 09/17/18 0001       Knee/Hip Exercises: Stretches   Passive Hamstring Stretch  Right;30 seconds;2 reps    Passive Hamstring Stretch Limitations  supine with strap    Hip Flexor Stretch  Right;30 seconds;2 reps    Hip Flexor Stretch Limitations  mod thomas position with strap   cues for form     Knee/Hip Exercises: Standing   Knee Flexion  Strengthening;Right;1 set;10 reps    Knee Flexion Limitations  3 sec eccentric lower at counter top   cues to avoid pushing to point of cramping    Hip Flexion  Stengthening;Right;1 set;10 reps;Knee straight    Hip Flexion Limitations  at counter top    Hip ADduction  Strengthening;Right;1 set;10 reps    Hip ADduction Limitations  at counter top    Hip Abduction  Right;Stengthening;1 set;10 reps;Knee straight    Abduction Limitations  at counter top    Hip Extension  Right;Stengthening;1 set;10 reps;Knee straight    Extension Limitations  at counter top      Knee/Hip Exercises: Seated   Long Arc Quad  Right;1 set;10 reps    Long Arc Quad Weight  --   3 sec eccentric  lower   Sit to Sand  1 set;10 reps;with UE support   sitting on airex with R foot unweighted     Knee/Hip Exercises: Supine   Straight Leg Raises  Right;Strengthening;1 set;10 reps    Straight Leg Raises Limitations  long sitting    Straight Leg Raise with External Rotation  Strengthening;Right;1 set;10 reps    Straight Leg Raise with External Rotation Limitations  cues for speed      Knee/Hip Exercises: Sidelying   Clams  2x10 each LE with red TB             PT Education - 09/17/18 1147    Education Details  administered red TB for clamshells    Person(s) Educated  Patient    Methods  Explanation;Demonstration;Tactile cues;Verbal cues    Comprehension  Verbalized understanding;Returned demonstration       PT Short Term Goals - 09/14/18 1135      PT SHORT TERM GOAL #1   Title  Patient to be independent with initial HEP.    Time  3    Period  Weeks    Status  Achieved     Target Date  09/24/18        PT Long Term Goals - 09/07/18 1215      PT LONG TERM GOAL #1   Title  Patient to be independent with advanced HEP.    Time  6    Period  Weeks    Status  On-going      PT LONG TERM GOAL #2   Title  Patient to demonstrate symmetrical and nonpainful R knee AROM/PROM to opposite extremity.    Time  6    Period  Weeks    Status  On-going      PT LONG TERM GOAL #3   Title  Patient to demonstrate B LE strength >=4+/5.    Time  6    Period  Weeks    Status  On-going      PT LONG TERM GOAL #4   Title  Patient to demonstrate and recall correct WBing precautions with ambulation and transfers.    Time  6    Period  Weeks    Status  On-going      PT LONG TERM GOAL #5   Title  Patient to perform R SLR without quad lag.    Time  6    Period  Weeks    Status  On-going            Plan - 09/17/18 1148    Clinical Impression Statement  Patient arrived to session with report of pain in R knee with driving- reports that he is unsure if he has been cleared to drive. Advised patient to F/U with his MD for clearance. Worked on R LE NWBing exercises in standing for LE strengthening. Patient required intermittent cues for upright posture and sitting rest breaks d/t L LE fatigue. Patient did mention that his blood glucose was 70 when he left last PT session, but reports no symptoms of low glucose levels today. Advised patient to notify us if symptoms arise. Able to tolerate increased banded resistance with clamshells- patient demonstrating good form and tolerance. Patient tolerated session well and without complaints at end of session. Still demonstrating intermittent partial WBing on R LE with transfers and pivot turns- given reminders to maintain TWBing or NWBing.    Comorbidities  HA, GERD, DM, depression    PT Treatment/Interventions  ADLs/Self Care Home  Management;Cryotherapy;Electrical Stimulation;Iontophoresis 4mg /ml Dexamethasone;Moist Heat;Balance  training;Therapeutic exercise;Therapeutic activities;Functional mobility training;Stair training;Gait training;DME Instruction;Ultrasound;Neuromuscular re-education;Patient/family education;Orthotic Fit/Training;Manual techniques;Vasopneumatic Device;Taping;Splinting;Energy conservation;Dry needling;Passive range of motion    PT Next Visit Plan  LE strengthening therex; Gait/stair training with crutches and 4WW/2WW prn    Consulted and Agree with Plan of Care  Patient       Patient will benefit from skilled therapeutic intervention in order to improve the following deficits and impairments:  Hypomobility, Increased edema, Decreased knowledge of precautions, Decreased activity tolerance, Decreased strength, Pain, Difficulty walking, Decreased mobility, Decreased balance, Decreased range of motion, Improper body mechanics, Postural dysfunction, Impaired flexibility, Decreased safety awareness  Visit Diagnosis: 1. Acute pain of right knee   2. Stiffness of right knee, not elsewhere classified   3. Muscle weakness (generalized)   4. Difficulty in walking, not elsewhere classified        Problem List Patient Active Problem List   Diagnosis Date Noted  . Acute pain of right knee 08/07/2018  . GAD (generalized anxiety disorder) 07/03/2017  . Obstructive sleep apnea 06/09/2016  . Type 1 diabetes mellitus, uncontrolled (HCC) 10/02/2015  . Major depression in complete remission (HCC) 05/15/2015  . Cervical radiculitis 01/25/2015  . BPH (benign prostatic hyperplasia) 09/12/2014  . Neck pain 02/27/2013  . Chronic headaches 09/28/2011  . Seasonal and perennial allergic rhinitis 09/28/2011  . FATIGUE, CHRONIC 12/16/2008     Anette GuarneriYevgeniya Kovalenko, PT, DPT 09/17/18 11:57 AM   [facility] [address]  [address] [place], [place], [address] Phone: [phone]   Fax:  [phone]  Name: Mosie LukesJeffrey A Lemon MRN: [id number] Date of Birth:  02-06-1966

## 2018-09-17 NOTE — Telephone Encounter (Signed)
Pt wants to know if it was ok from him to drive and if he had any restrictions.

## 2018-09-20 ENCOUNTER — Other Ambulatory Visit: Payer: Self-pay

## 2018-09-20 ENCOUNTER — Ambulatory Visit: Payer: 59 | Attending: Orthopaedic Surgery

## 2018-09-20 ENCOUNTER — Ambulatory Visit: Payer: 59 | Admitting: Internal Medicine

## 2018-09-20 DIAGNOSIS — M6281 Muscle weakness (generalized): Secondary | ICD-10-CM | POA: Insufficient documentation

## 2018-09-20 DIAGNOSIS — M25561 Pain in right knee: Secondary | ICD-10-CM | POA: Insufficient documentation

## 2018-09-20 DIAGNOSIS — M25661 Stiffness of right knee, not elsewhere classified: Secondary | ICD-10-CM | POA: Diagnosis not present

## 2018-09-20 DIAGNOSIS — R262 Difficulty in walking, not elsewhere classified: Secondary | ICD-10-CM | POA: Insufficient documentation

## 2018-09-20 NOTE — Therapy (Signed)
[facility] [address]  [place], [place], [address] Phone: [phone]   Fax:  [phone]  Physical Therapy Treatment  Patient Details  Name: Nathaniel [name] MRN: [id number] Date of Birth: January 16, 1966 Referring Provider (PT): Rodell Perna, MD   Encounter Date: 09/20/2018  PT End of Session - 09/20/18 1020    Visit Number  6    Number of Visits  13    Date for PT Re-Evaluation  10/15/18    Authorization Type  [facility]    PT Start Time  1017    PT Stop Time  1058    PT Time Calculation (min)  41 min    Equipment Utilized During Treatment  Other (comment)   2 axillary crutches   Activity Tolerance  Patient tolerated treatment well    Behavior During Therapy  [facility] for tasks assessed/performed       Past Medical History:  Diagnosis Date  . Depression   . Diabetes mellitus    type II  . GERD (gastroesophageal reflux disease)   . Headache(784.0)   . Hypogonadism male   . Sleep apnea     Past Surgical History:  Procedure Laterality Date  . NASAL SINUS SURGERY  04/1991  . TONSILLECTOMY  1972    There were no vitals filed for this visit.  Subjective Assessment - 09/20/18 1019    Subjective  Pt. with no new complaints today.  Spoke with MD over phone who confirmed pt. is cleared to drive.    Pertinent History  HA, GERD, DM, depression    Diagnostic tests  08/20/18 R knee MRI: Marrow edema in the periphery of medial femoral condyle with a subtle area of subchondral linear low signal peripherally concerning for a nondisplaced impaction fracture. Small undersurface tear of the posterior horn-body junction of the medial meniscus.    Patient Stated Goals  "I just want to get back to my normal self"    Currently in Pain?  No/denies    Pain Score  0-No pain   2/10 pain at most   Pain Location  Knee    Pain Orientation  Right;Medial    Pain Descriptors / Indicators  --   "muscular soreness"   Pain Type  Acute pain     Multiple Pain Sites  No         [facility] PT Assessment - 09/20/18 0001      AROM   AROM Assessment Site  Knee    Right/Left Knee  Right;Left    Right Knee Extension  0   0 dg with SLR   Right Knee Flexion  134    Left Knee Extension  0    Left Knee Flexion  136      PROM   PROM Assessment Site  Knee    Right/Left Knee  Right;Left    Right Knee Extension  0    Right Knee Flexion  136    Left Knee Extension  0    Left Knee Flexion  137                   [facility] Adult PT Treatment/Exercise - 09/20/18 0001      Self-Care   Self-Care  Other Self-Care Comments    Other Self-Care Comments   reviewed comprehensive HEP removing initial HEP that is no longer appropriate and focusing pt. attention on most updated HEP and cheching for challange/tolerance       Knee/Hip  Exercises: Stretches   Hip Flexor Stretch  Right;30 seconds;2 reps    Hip Flexor Stretch Limitations  mod thomas position with strap   Cues again required for technique      Knee/Hip Exercises: Seated   Hamstring Curl  Right;15 reps    Hamstring Limitations  green TB       Knee/Hip Exercises: Supine   Straight Leg Raises  Right;15 reps;2 sets;Strengthening    Other Supine Knee/Hip Exercises  Straight leg bridge with LE extended straight out on peanut p-ball x 12 reps       Knee/Hip Exercises: Sidelying   Hip ABduction  Right;15 reps;Strengthening    Hip ADduction  Right;15 reps    Clams  x 12 reps red looped TB at knees       Knee/Hip Exercises: Prone   Hip Extension  Right;10 reps    Hip Extension Limitations  bent knee             PT Education - 09/20/18 1041    Education Details  HEP update:    removed SLR, supine quad set, heel slide, SAQ  exercises from home program as pt. now symmetrical nonpainful knee ROM R L without quad lag    Person(s) Educated  Patient    Methods  Explanation;Demonstration;Verbal cues;Handout    Comprehension  Verbalized understanding;Returned demonstration;Verbal  cues required       PT Short Term Goals - 09/14/18 1135      PT SHORT TERM GOAL #1   Title  Patient to be independent with initial HEP.    Time  3    Period  Weeks    Status  Achieved    Target Date  09/24/18        PT Long Term Goals - 09/20/18 1023      PT LONG TERM GOAL #1   Title  Patient to be independent with advanced HEP.    Time  6    Period  Weeks    Status  Partially Met      PT LONG TERM GOAL #2   Title  Patient to demonstrate symmetrical and nonpainful R knee AROM/PROM to opposite extremity.    Time  6    Period  Weeks    Status  Achieved      PT LONG TERM GOAL #3   Title  Patient to demonstrate B LE strength >=4+/5.    Time  6    Period  Weeks    Status  On-going      PT LONG TERM GOAL #4   Title  Patient to demonstrate and recall correct WBing precautions with ambulation and transfers.    Time  6    Period  Weeks    Status  On-going      PT LONG TERM GOAL #5   Title  Patient to perform R SLR without quad lag.    Time  6    Period  Weeks    Status  Achieved            Plan - 09/20/18 1054    Clinical Impression Statement  Pt. doing well today with no new complaints.  Reports MD gave him permission to drive with no restrictions.  Pt. able to achieve LTG #2, and #5 demonstrating non- painful, symmetrical R/L knee AROM, and now able to demo SLR without quad lag.  Also reviewed and consolidated current HEP removing all initial HEP activities (see pt. education section).  Progressing well toward  goals.    Personal Factors and Comorbidities  Behavior Pattern;Comorbidity 3+;Time since onset of injury/illness/exacerbation;Fitness;Profession    Comorbidities  HA, GERD, DM, depression    Rehab Potential  Good    PT Treatment/Interventions  ADLs/Self Care Home Management;Cryotherapy;Electrical Stimulation;Iontophoresis 83m/ml Dexamethasone;Moist Heat;Balance training;Therapeutic exercise;Therapeutic activities;Functional mobility training;Stair  training;Gait training;DME Instruction;Ultrasound;Neuromuscular re-education;Patient/family education;Orthotic Fit/Training;Manual techniques;Vasopneumatic Device;Taping;Splinting;Energy conservation;Dry needling;Passive range of motion    PT Next Visit Plan  LE strengthening therex; Gait/stair training with crutches and 4WW/2WW prn    Consulted and Agree with Plan of Care  Patient       Patient will benefit from skilled therapeutic intervention in order to improve the following deficits and impairments:  Hypomobility, Increased edema, Decreased knowledge of precautions, Decreased activity tolerance, Decreased strength, Pain, Difficulty walking, Decreased mobility, Decreased balance, Decreased range of motion, Improper body mechanics, Postural dysfunction, Impaired flexibility, Decreased safety awareness  Visit Diagnosis: 1. Acute pain of right knee   2. Stiffness of right knee, not elsewhere classified   3. Muscle weakness (generalized)   4. Difficulty in walking, not elsewhere classified        Problem List Patient Active Problem List   Diagnosis Date Noted  . Acute pain of right knee 08/07/2018  . GAD (generalized anxiety disorder) 07/03/2017  . Obstructive sleep apnea 06/09/2016  . Type 1 diabetes mellitus, uncontrolled ([facility] 10/02/2015  . Major depression in complete remission ([facility] 05/15/2015  . Cervical radiculitis 01/25/2015  . BPH (benign prostatic hyperplasia) 09/12/2014  . Neck pain 02/27/2013  . Chronic headaches 09/28/2011  . Seasonal and perennial allergic rhinitis 09/28/2011  . FATIGUE, CHRONIC 12/16/2008    Nathaniel [name] PTA 09/20/18 12:28 PM    [facility] [address] [place] [address]Phone: [phone]  Fax:  [phone] Name: JENNIO HOUPMRN: [id number] of Birth: 604/16/1967

## 2018-09-24 ENCOUNTER — Encounter: Payer: Self-pay | Admitting: Internal Medicine

## 2018-09-24 ENCOUNTER — Encounter: Payer: Self-pay | Admitting: Physical Therapy

## 2018-09-24 ENCOUNTER — Other Ambulatory Visit: Payer: Self-pay

## 2018-09-24 ENCOUNTER — Ambulatory Visit: Payer: 59 | Admitting: Physical Therapy

## 2018-09-24 DIAGNOSIS — R262 Difficulty in walking, not elsewhere classified: Secondary | ICD-10-CM

## 2018-09-24 DIAGNOSIS — M25661 Stiffness of right knee, not elsewhere classified: Secondary | ICD-10-CM | POA: Diagnosis not present

## 2018-09-24 DIAGNOSIS — M6281 Muscle weakness (generalized): Secondary | ICD-10-CM | POA: Diagnosis not present

## 2018-09-24 DIAGNOSIS — M25561 Pain in right knee: Secondary | ICD-10-CM | POA: Diagnosis not present

## 2018-09-24 MED FILL — LISINOPRIL 2.5 MG TABLET: 2.5 | 90 days supply | Qty: 90 | Fill #1

## 2018-09-24 NOTE — Therapy (Addendum)
[facility] [address]  [place], [place], [address] Phone: [phone]   Fax:  [phone]  Physical Therapy Treatment  Patient Details  Name: Nathaniel [name] MRN: [id number] Date of Birth: [birth year] Referring Provider (PT): Rodell Perna, MD   Encounter Date: 09/24/2018  PT End of Session - 09/24/18 1102    Visit Number  7    Number of Visits  13    Date for PT Re-Evaluation  10/15/18    Authorization Type  [facility]    PT Start Time  1015    PT Stop Time  1056    PT Time Calculation (min)  41 min    Equipment Utilized During Treatment  Other (comment)   2 axillary crutches   Activity Tolerance  Patient tolerated treatment well    Behavior During Therapy  [facility] for tasks assessed/performed       Past Medical History:  Diagnosis Date  . Depression   . Diabetes mellitus    type II  . GERD (gastroesophageal reflux disease)   . Headache(784.0)   . Hypogonadism male   . Sleep apnea     Past Surgical History:  Procedure Laterality Date  . NASAL SINUS SURGERY  04/1991  . TONSILLECTOMY  1972    There were no vitals filed for this visit.  Subjective Assessment - 09/24/18 1017    Subjective  Reports no pain since last appointment. Reports improvements in pain levels, ROM, and strength since starting PT. Reports 80% improvement.    Pertinent History  HA, GERD, DM, depression    Diagnostic tests  08/20/18 R knee MRI: Marrow edema in the periphery of medial femoral condyle with a subtle area of subchondral linear low signal peripherally concerning for a nondisplaced impaction fracture. Small undersurface tear of the posterior horn-body junction of the medial meniscus.    Patient Stated Goals  "I just want to get back to my normal self"    Currently in Pain?  No/denies         [facility] PT Assessment - 09/24/18 0001      AROM   Right Knee Extension  4    Right Knee Flexion  129      PROM   Right Knee Extension  1     Right Knee Flexion  136   discomfort     Strength   Right/Left Hip  Right;Left    Right Hip Flexion  5/5    Right Hip ABduction  4+/5    Right Hip ADduction  4+/5    Left Hip Flexion  5/5    Left Hip ABduction  4+/5    Left Hip ADduction  4+/5    Right Knee Flexion  4+/5    Right Knee Extension  4+/5    Left Knee Flexion  4+/5    Left Knee Extension  4+/5    Right Ankle Dorsiflexion  4+/5    Left Ankle Dorsiflexion  5/5                   [facility] Adult PT Treatment/Exercise - 09/24/18 0001      Knee/Hip Exercises: Stretches   Designer, fashion/clothing Limitations  prone strap      Knee/Hip Exercises: Standing   Heel Raises  Left;1 set;10 reps    Heel Raises Limitations  at counter top   heavy UE reliance   Hip Abduction  Right;Stengthening;1  set;10 reps;Knee straight    Abduction Limitations  at counter top    Hip Extension  Right;Stengthening;1 set;10 reps;Knee straight    Extension Limitations  at counter top      Knee/Hip Exercises: Supine   Quad Sets  Strengthening;Right;1 set;10 reps    Quad Sets Limitations  10x10" with towel roll under heel    Single Leg Bridge  Strengthening;Left;2 sets;10 reps   visible difficulty   Straight Leg Raises  Strengthening;Right;1 set;10 reps    Straight Leg Raises Limitations  cues for quad set    Straight Leg Raise with External Rotation  Strengthening;Right;1 set;10 reps    Straight Leg Raise with External Rotation Limitations  difficulty      Knee/Hip Exercises: Prone   Hip Extension  Strengthening;Right;1 set;10 reps    Hip Extension Limitations  prone donkey kicks   heavy compensations on L LE            PT Education - 09/24/18 1058    Education Details  update and consolidation of HEP; discussion on objective progress with PT    Person(s) Educated  Patient    Methods  Explanation;Demonstration;Verbal cues;Tactile cues;Handout    Comprehension  Verbalized  understanding;Returned demonstration       PT Short Term Goals - 09/24/18 1018      PT SHORT TERM GOAL #1   Title  Patient to be independent with initial HEP.    Time  3    Period  Weeks    Status  Achieved    Target Date  09/24/18        PT Long Term Goals - 09/24/18 1018      PT LONG TERM GOAL #1   Title  Patient to be independent with advanced HEP.    Time  6    Period  Weeks    Status  Achieved   met for current     PT LONG TERM GOAL #2   Title  Patient to demonstrate symmetrical and nonpainful R knee AROM/PROM to opposite extremity.    Time  6    Period  Weeks    Status  Partially Met   mildly limited in R knee extension     PT LONG TERM GOAL #3   Title  Patient to demonstrate B LE strength >=4+/5.    Time  6    Period  Weeks    Status  Achieved      PT LONG TERM GOAL #4   Title  Patient to demonstrate and recall correct WBing precautions with ambulation and transfers.    Time  6    Period  Weeks    Status  Achieved      PT LONG TERM GOAL #5   Title  Patient to perform R SLR without quad lag.    Time  6    Period  Weeks    Status  Achieved            Plan - 09/24/18 1159    Clinical Impression Statement  Patient arrived to session with report of 80% improvement since starting PT. Has noticed improvements in pain levels, ROM, and strength. Patient has met or partially met all goals at this time, now limited mostly by [facility] restrictions. Patient has shown improvements in R knee AROM and PROM. Still limited in full knee extension, however this has been addressed with ther-ex today. Strength measurements revealed improvements in R knee flexion, extension, and ankle dorsiflexion. Patient has demonstrated  improvement in knowledge and understanding of WBing precautions as well as improved carryover. Demonstrating SLR without quad leg, but with remaining lack of TKE. Worked on progressive glute strengthening ther-ex this session with R LE NWBing. Patient  intermittently requiring cues to correct form. Updated HEP for increased difficulty- patient reported understanding. No complaints at end of session. Placing patient on 30 day hold at this time d/t meeting goals. Plan to resume PT once cleared by MD for increased WBing.    Comorbidities  HA, GERD, DM, depression    PT Treatment/Interventions  ADLs/Self Care Home Management;Cryotherapy;Electrical Stimulation;Iontophoresis 45m/ml Dexamethasone;Moist Heat;Balance training;Therapeutic exercise;Therapeutic activities;Functional mobility training;Stair training;Gait training;DME Instruction;Ultrasound;Neuromuscular re-education;Patient/family education;Orthotic Fit/Training;Manual techniques;Vasopneumatic Device;Taping;Splinting;Energy conservation;Dry needling;Passive range of motion    PT Next Visit Plan  30 day hold at this time    Consulted and Agree with Plan of Care  Patient       Patient will benefit from skilled therapeutic intervention in order to improve the following deficits and impairments:  Hypomobility, Increased edema, Decreased knowledge of precautions, Decreased activity tolerance, Decreased strength, Pain, Difficulty walking, Decreased mobility, Decreased balance, Decreased range of motion, Improper body mechanics, Postural dysfunction, Impaired flexibility, Decreased safety awareness  Visit Diagnosis: 1. Acute pain of right knee   2. Stiffness of right knee, not elsewhere classified   3. Muscle weakness (generalized)   4. Difficulty in walking, not elsewhere classified        Problem List Patient Active Problem List   Diagnosis Date Noted  . Acute pain of right knee 08/07/2018  . GAD (generalized anxiety disorder) 07/03/2017  . Obstructive sleep apnea 06/09/2016  . Type 1 diabetes mellitus, uncontrolled ([facility] 10/02/2015  . Major depression in complete remission ([facility] 05/15/2015  . Cervical radiculitis 01/25/2015  . BPH (benign prostatic hyperplasia) 09/12/2014  . Neck pain  02/27/2013  . Chronic headaches 09/28/2011  . Seasonal and perennial allergic rhinitis 09/28/2011  . FATIGUE, CHRONIC 12/16/2008    YJanene Harvey PT, DPT 09/24/18 12:05 PM    [facility] [address] [place] [address]Phone: [phone]  Fax:  [phone] Name: Nathaniel MARTINOMRN: [id number] of Birth: 609/09/1965  PHYSICAL THERAPY DISCHARGE SUMMARY  Visits from Start of Care: 7  Current functional level related to goals / functional outcomes: See above clinical impression; patient did not return after 30 day hold   Remaining deficits: Decreased knee extension ROM   Education / Equipment: HEP  Plan: Patient agrees to discharge.  Patient goals were partially met. Patient is being discharged due to meeting the stated rehab goals.  ?????     YJanene Harvey PT, DPT 11/14/18 2:07 PM

## 2018-09-26 ENCOUNTER — Encounter: Payer: Self-pay | Admitting: Orthopaedic Surgery

## 2018-09-26 ENCOUNTER — Other Ambulatory Visit: Payer: Self-pay

## 2018-09-26 ENCOUNTER — Ambulatory Visit (INDEPENDENT_AMBULATORY_CARE_PROVIDER_SITE_OTHER): Payer: 59 | Admitting: Orthopaedic Surgery

## 2018-09-26 VITALS — Ht 72.0 in | Wt 214.0 lb

## 2018-09-26 DIAGNOSIS — E109 Type 1 diabetes mellitus without complications: Secondary | ICD-10-CM | POA: Diagnosis not present

## 2018-09-26 DIAGNOSIS — M25561 Pain in right knee: Secondary | ICD-10-CM | POA: Diagnosis not present

## 2018-09-26 DIAGNOSIS — Z6829 Body mass index (BMI) 29.0-29.9, adult: Secondary | ICD-10-CM | POA: Diagnosis not present

## 2018-09-26 DIAGNOSIS — Z7189 Other specified counseling: Secondary | ICD-10-CM | POA: Diagnosis not present

## 2018-09-26 DIAGNOSIS — Z9641 Presence of insulin pump (external) (internal): Secondary | ICD-10-CM | POA: Diagnosis not present

## 2018-09-26 DIAGNOSIS — Z794 Long term (current) use of insulin: Secondary | ICD-10-CM | POA: Diagnosis not present

## 2018-09-26 LAB — HEMOGLOBIN A1C: Hemoglobin A1C: 9.3

## 2018-09-26 NOTE — Progress Notes (Signed)
Office Visit Note   Patient: Nathaniel [name]           Date of Birth: 09/09/1965           MRN: [id number] Visit Date: 09/26/2018              Requested by: Gregor Hams, MD [address] [place],  [place] [address] PCP: Gregor Hams, MD   Assessment & Plan: Visit Diagnoses:  1. Acute pain of right knee     Plan: Medial femoral condyle subchondral impaction fracture related to increased ramping up with running.  He can progress from 2 crutches to 1 crutch and then to a cane as long as his symptoms do not flare with increased pain he can continue to progress toward full weightbearing.  Long discussion about extremely slow ramp up slope with he plans to resume running once again.  I plan to recheck him hopeful for final visit in 1 month.  Follow-Up Instructions: Return in about 1 month (around 10/27/2018).   Orders:  No orders of the defined types were placed in this encounter.  No orders of the defined types were placed in this encounter.     Procedures: No procedures performed   Clinical Data: No additional findings.   Subjective: Chief Complaint  Patient presents with  . Right Knee - Follow-up    HPI 53 year old male returns for follow-up with onset of right knee pain in May when he started doing some running.  He was doing some walking or running grades increased running.  MRI scan showed subchondral impaction fracture medial femoral condyleWith MRI performed on 08/20/2018.  He has been using both crutches nonweightbearing or touchdown weightbearing.  Patient states his pain is significantly improved he is not noted any swelling in his knee in the last few weeks.  Review of Systems Reviewed updated unchanged 14 point systems updated other than as mentioned HPI.  Objective: Vital Signs: Ht 6' (1.829 m)   Wt 214 lb (97.1 kg)   BMI 29.02 kg/m   Physical Exam Constitutional:      Appearance: He is well-developed.  HENT:     Head: Normocephalic and  atraumatic.  Eyes:     Pupils: Pupils are equal, round, and reactive to light.  Neck:     Thyroid: No thyromegaly.     Trachea: No tracheal deviation.  Cardiovascular:     Rate and Rhythm: Normal rate.  Pulmonary:     Effort: Pulmonary effort is normal.     Breath sounds: No wheezing.  Abdominal:     General: Bowel sounds are normal.     Palpations: Abdomen is soft.  Skin:    General: Skin is warm and dry.     Capillary Refill: Capillary refill takes less than 2 seconds.  Neurological:     Mental Status: He is alert and oriented to person, place, and time.  Psychiatric:        Behavior: Behavior normal.        Thought Content: Thought content normal.        Judgment: Judgment normal.     Ortho Exam patient has mild medial joint line tenderness he is amatory with slight knee limp lacks 10 degrees full extension with swing phase of his gait and heel plant.  Collateral ligaments are stable.  No medial joint line tenderness.  Specialty Comments:  No specialty comments available.  Imaging: No results found.   PMFS History: Patient Active Problem  List   Diagnosis Date Noted  . Acute pain of right knee 08/07/2018  . GAD (generalized anxiety disorder) 07/03/2017  . Obstructive sleep apnea 06/09/2016  . Type 1 diabetes mellitus, uncontrolled (HCC) 10/02/2015  . Major depression in complete remission (HCC) 05/15/2015  . Cervical radiculitis 01/25/2015  . BPH (benign prostatic hyperplasia) 09/12/2014  . Neck pain 02/27/2013  . Chronic headaches 09/28/2011  . Seasonal and perennial allergic rhinitis 09/28/2011  . FATIGUE, CHRONIC 12/16/2008   Past Medical History:  Diagnosis Date  . Depression   . Diabetes mellitus    type II  . GERD (gastroesophageal reflux disease)   . Headache(784.0)   . Hypogonadism male   . Sleep apnea     Family History  Adopted: Yes  Problem Relation Age of Onset  . Cancer Father [id number]       Prostate cancer  . Healthy Mother   . Diabetes  Mellitus I Son     Past Surgical History:  Procedure Laterality Date  . NASAL SINUS SURGERY  04/1991  . TONSILLECTOMY  1972   Social History   Occupational History    Employer: [employer]    Comment: [employer]  Tobacco Use  . Smoking status: Never Smoker  . Smokeless tobacco: Never Used  Substance and Sexual Activity  . Alcohol use: No  . Drug use: No  . Sexual activity: Not on file

## 2018-10-02 MED FILL — HumaLOG 100 UNIT/ML SOLN: 100 | 43 days supply | Qty: 30 | Fill #1

## 2018-10-04 ENCOUNTER — Encounter: Payer: 59 | Admitting: Physical Therapy

## 2018-10-07 ENCOUNTER — Encounter: Payer: Self-pay | Admitting: Family Medicine

## 2018-10-09 MED FILL — FREESTYLE LIBRE 14 DAY SENS: 28 days supply | Qty: 2 | Fill #0

## 2018-10-10 ENCOUNTER — Encounter: Payer: Self-pay | Admitting: Orthopaedic Surgery

## 2018-10-11 ENCOUNTER — Encounter: Payer: 59 | Admitting: Physical Therapy

## 2018-10-18 ENCOUNTER — Encounter: Payer: 59 | Admitting: Physical Therapy

## 2018-10-20 ENCOUNTER — Encounter: Payer: Self-pay | Admitting: Family Medicine

## 2018-10-22 ENCOUNTER — Encounter: Payer: Self-pay | Admitting: Family Medicine

## 2018-10-22 ENCOUNTER — Ambulatory Visit (INDEPENDENT_AMBULATORY_CARE_PROVIDER_SITE_OTHER): Payer: 59 | Admitting: Family Medicine

## 2018-10-22 VITALS — Wt 215.0 lb

## 2018-10-22 DIAGNOSIS — G5602 Carpal tunnel syndrome, left upper limb: Secondary | ICD-10-CM

## 2018-10-22 DIAGNOSIS — G4733 Obstructive sleep apnea (adult) (pediatric): Secondary | ICD-10-CM | POA: Diagnosis not present

## 2018-10-22 DIAGNOSIS — Z789 Other specified health status: Secondary | ICD-10-CM

## 2018-10-22 DIAGNOSIS — E1065 Type 1 diabetes mellitus with hyperglycemia: Secondary | ICD-10-CM

## 2018-10-22 NOTE — Progress Notes (Signed)
No answer- left message. Pt is ready for web ex. He is logged in

## 2018-10-22 NOTE — Progress Notes (Signed)
Virtual Visit  I connected with      Nathaniel [name]  by a telemedicine application and verified that I am speaking with the correct person using two identifiers.   I discussed the limitations of evaluation and management by telemedicine and the availability of in person appointments. The patient expressed understanding and agreed to proceed.  History of Present Illness: Nathaniel [name] is a 53 y.o. male who would like to discuss follow-up diabetes sleep apnea and discuss hand numbness.   Diabetes: Doing reasonably well managed by endocrinology at [facility]Dr Garnet Koyanagi  A1c was a bit elevated at last time at around 8.  He has been able to exercise less following his knee surgery in the last few months.  He has a history of sleep apnea which is managed with CPAP.  He feels well rested and notes his AHI is around 2.  He notes that he requested a 90-day compliance report to be sent to me which should be available soon.    Numbness in left hand 2, 3 and 4th digit.  Symptoms present for few weeks thought to be due to crutch use.  He notes it worsened when he was using his crutches.  He stopped using crutches a few weeks ago and notes the numbness is persistent.   Does not wake up from night. Worse in the morning.  Sometimes the numbness goes into the thumb as well.  He denies any symptoms of the dorsal aspect of his hand or his fifth digit.  He denies any history of carpal tunnel syndrome.  He is not tried any treatment yet.   Observations/Objective: Wt 215 lb (97.5 kg)   BMI 29.16 kg/m  Wt Readings from Last 5 Encounters:  10/22/18 215 lb (97.5 kg)  09/26/18 214 lb (97.1 kg)  08/29/18 214 lb (97.1 kg)  08/20/18 216 lb (98 kg)  08/07/18 215 lb (97.5 kg)   Exam: Normal Speech.    Lab and Radiology Results No results found for this or any previous visit (from the past 72 hour(s)). No results found.   Assessment and Plan: 52 y.o. male with  Diabetes: Doing  reasonably well not ideally controlled.  Will request records from endocrinology.  Patient will be due for basic routine lab work.  Will check labs in the near future.  Sleep apnea: Doing well.  Await CPAP compliance report.  Hand numbness: Very likely carpal tunnel syndrome due to a crutch usage.  Discussed options.  Plan for wrist splint at night and recheck in future if not improving.  Would consider ultrasound and possibly injection although would like to avoid steroid injection with diabetes.  PDMP not reviewed this encounter. Orders Placed This Encounter  Procedures  . CBC  . COMPLETE METABOLIC PANEL WITH GFR  . LDL cholesterol, direct   No orders of the defined types were placed in this encounter.   Follow Up Instructions:    I discussed the assessment and treatment plan with the patient. The patient was provided an opportunity to ask questions and all were answered. The patient agreed with the plan and demonstrated an understanding of the instructions.   The patient was advised to call back or seek an in-person evaluation if the symptoms worsen or if the condition fails to improve as anticipated.  Time: 15 minutes of intraservice time, with >22 minutes of total time during today's visit.      Historical information moved to improve visibility of documentation.  Past  Medical History:  Diagnosis Date  . Depression   . Diabetes mellitus    type II  . GERD (gastroesophageal reflux disease)   . Headache(784.0)   . Hypogonadism male   . Sleep apnea    Past Surgical History:  Procedure Laterality Date  . NASAL SINUS SURGERY  04/1991  . TONSILLECTOMY  1972   Social History   Tobacco Use  . Smoking status: Never Smoker  . Smokeless tobacco: Never Used  Substance Use Topics  . Alcohol use: No   family history includes Cancer (age of onset: 44) in his father; Diabetes Mellitus I in his son; Healthy in his mother. He was adopted.  Medications: Current Outpatient  Medications  Medication Sig Dispense Refill  . Alcohol Swabs (ALCOHOL PREPS) PADS Use alcohol pads to clean site to be used to check blood sugar. 100 each 11  . AMBULATORY NON FORMULARY MEDICATION Change CPAP setting to 13.5 (14 is ok if 13.5 is not available). Please provide new machine as old machine was not fixable and Mr Hartman has improved AHI with new machine. 1 each 0  . AMBULATORY NON FORMULARY MEDICATION Walker. Use as needed. M25.561 1 each 0  . aspirin EC 81 MG tablet Take 2 tablets (162 mg total) by mouth daily. 90 tablet 3  . Blood Glucose Monitoring Suppl (CONTOUR NEXT MONITOR) w/Device KIT 1 Device by Does not apply route daily. Use to test blood sugar daily 1 kit 1  . Blood Glucose Monitoring Suppl (FREESTYLE LITE) DEVI Use to check sugar 4 times daily. 1 each 0  . Continuous Blood Gluc Sensor (FREESTYLE LIBRE 14 DAY SENSOR) MISC 1 each by Does not apply route every 14 (fourteen) days. Change every 2 weeks 6 each 3  . GLUCAGON EMERGENCY 1 MG injection INJECT 1 MG INTO THE MUSCLE ONCE AS NEEDED. 1 kit 3  . glucose blood (CONTOUR NEXT TEST) test strip Use as instructed to check blood sugar 5 times daily. 450 each 12  . insulin lispro (HUMALOG) 100 UNIT/ML injection INJECT up to 70 units INTO THE SKIN DAILY-in the insulin pump 30 mL 11  . Lancets (FREESTYLE) lancets Use as instructed to check sugar 4 times daily. 400 each 5  . lisinopril (ZESTRIL) 2.5 MG tablet     . metFORMIN (GLUCOPHAGE-XR) 500 MG 24 hr tablet TAKE 4 TABLETS BY MOUTH DAILY in the morning 360 tablet 3  . ondansetron (ZOFRAN ODT) 4 MG disintegrating tablet Take one tab by mouth Q6hr prn nausea.  Dissolve under tongue. 12 tablet 0  . rosuvastatin (CRESTOR) 5 MG tablet TAKE 1 TABLET (5 MG TOTAL) BY MOUTH DAILY. 90 tablet 3  . Vilazodone HCl (VIIBRYD) 20 MG TABS Take 1 tablet (20 mg total) by mouth daily. 90 tablet 1   No current facility-administered medications for this visit.    Allergies  Allergen Reactions  .  Cymbalta [Duloxetine Hcl] Other (See Comments)    Suicidal thoughts  . Influenza Vaccines Other (See Comments)    Unknown reaction  . Penicillins Other (See Comments)    Unknown - childhood allergy per mother  . Pioglitazone Other (See Comments)      headache  . Promethazine Hcl Nausea And Vomiting       . Reglan [Metoclopramide] Other (See Comments)    Jittery, increased anxiety

## 2018-10-22 NOTE — Patient Instructions (Addendum)
Thank you for coming in today.  Use a night splint or wrist splint for carpal tunnel syndrome.  If not improving return to clinic in person and we will do an ultrasound to evaluate for possible carpal tunnel syndrome.  Get labs in near future.  I will request records from your endocrinologist.   Carpal Tunnel Syndrome  Carpal tunnel syndrome is a condition that causes pain in your hand and arm. The carpal tunnel is a narrow area that is on the palm side of your wrist. Repeated wrist motion or certain diseases may cause swelling in the tunnel. This swelling can pinch the main nerve in the wrist (median nerve). What are the causes? This condition may be caused by:  Repeated wrist motions.  Wrist injuries.  Arthritis.  A sac of fluid (cyst) or abnormal growth (tumor) in the carpal tunnel.  Fluid buildup during pregnancy. Sometimes the cause is not known. What increases the risk? The following factors may make you more likely to develop this condition:  Having a job in which you move your wrist in the same way many times. This includes jobs like being a Software engineer or a Scientist, water quality.  Being a woman.  Having other health conditions, such as: ? Diabetes. ? Obesity. ? A thyroid gland that is not active enough (hypothyroidism). ? Kidney failure. What are the signs or symptoms? Symptoms of this condition include:  A tingling feeling in your fingers.  Tingling or a loss of feeling (numbness) in your hand.  Pain in your entire arm. This pain may get worse when you bend your wrist and elbow for a long time.  Pain in your wrist that goes up your arm to your shoulder.  Pain that goes down into your palm or fingers.  A weak feeling in your hands. You may find it hard to grab and hold items. You may feel worse at night. How is this diagnosed? This condition is diagnosed with a medical history and physical exam. You may also have tests, such as:  Electromyogram (EMG). This test checks  the signals that the nerves send to the muscles.  Nerve conduction study. This test checks how well signals pass through your nerves.  Imaging tests, such as X-rays, ultrasound, and MRI. These tests check for what might be the cause of your condition. How is this treated? This condition may be treated with:  Lifestyle changes. You will be asked to stop or change the activity that caused your problem.  Doing exercise and activities that make bones and muscles stronger (physical therapy).  Learning how to use your hand again (occupational therapy).  Medicines for pain and swelling (inflammation). You may have injections in your wrist.  A wrist splint.  Surgery. Follow these instructions at home: If you have a splint:  Wear the splint as told by your doctor. Remove it only as told by your doctor.  Loosen the splint if your fingers: ? Tingle. ? Lose feeling (become numb). ? Turn cold and blue.  Keep the splint clean.  If the splint is not waterproof: ? Do not let it get wet. ? Cover it with a watertight covering when you take a bath or a shower. Managing pain, stiffness, and swelling   If told, put ice on the painful area: ? If you have a removable splint, remove it as told by your doctor. ? Put ice in a plastic bag. ? Place a towel between your skin and the bag. ? Leave the ice on for  20 minutes, 2-3 times per day. General instructions  Take over-the-counter and prescription medicines only as told by your doctor.  Rest your wrist from any activity that may cause pain. If needed, talk with your boss at work about changes that can help your wrist heal.  Do any exercises as told by your doctor, physical therapist, or occupational therapist.  Keep all follow-up visits as told by your doctor. This is important. Contact a doctor if:  You have new symptoms.  Medicine does not help your pain.  Your symptoms get worse. Get help right away if:  You have very bad  numbness or tingling in your wrist or hand. Summary  Carpal tunnel syndrome is a condition that causes pain in your hand and arm.  It is often caused by repeated wrist motions.  Lifestyle changes and medicines are used to treat this problem. Surgery may help in very bad cases.  Follow your doctor's instructions about wearing a splint, resting your wrist, keeping follow-up visits, and calling for help. This information is not intended to replace advice given to you by your health care provider. Make sure you discuss any questions you have with your health care provider. Document Released: 02/24/2011 Document Revised: 07/14/2017 Document Reviewed: 07/14/2017 Elsevier Patient Education  2020 [employer]

## 2018-10-23 LAB — CBC
HCT: 41.9 % (ref 38.5–50.0)
Hemoglobin: 13.9 g/dL (ref 13.2–17.1)
MCH: 28.5 pg (ref 27.0–33.0)
MCHC: 33.2 g/dL (ref 32.0–36.0)
MCV: 85.9 fL (ref 80.0–100.0)
MPV: 11.2 fL (ref 7.5–12.5)
Platelets: 214 10*3/uL (ref 140–400)
RBC: 4.88 10*6/uL (ref 4.20–5.80)
RDW: 12.2 % (ref 11.0–15.0)
WBC: 6.1 10*3/uL (ref 3.8–10.8)

## 2018-10-23 LAB — COMPLETE METABOLIC PANEL WITH GFR
AG Ratio: 1.5 (calc) (ref 1.0–2.5)
ALT: 27 U/L (ref 9–46)
AST: 24 U/L (ref 10–35)
Albumin: 4.3 g/dL (ref 3.6–5.1)
Alkaline phosphatase (APISO): 66 U/L (ref 35–144)
BUN: 14 mg/dL (ref 7–25)
CO2: 28 mmol/L (ref 20–32)
Calcium: 9.5 mg/dL (ref 8.6–10.3)
Chloride: 100 mmol/L (ref 98–110)
Creat: 1.18 mg/dL (ref 0.70–1.33)
GFR, Est African American: 81 mL/min/{1.73_m2} (ref >=60)
GFR, Est Non African American: 70 mL/min/{1.73_m2} (ref >=60)
Globulin: 2.9 g/dL (calc) (ref 1.9–3.7)
Glucose, Bld: 163 mg/dL — ABNORMAL HIGH (ref 65–139)
Potassium: 3.9 mmol/L (ref 3.5–5.3)
Sodium: 136 mmol/L (ref 135–146)
Total Bilirubin: 0.9 mg/dL (ref 0.2–1.2)
Total Protein: 7.2 g/dL (ref 6.1–8.1)

## 2018-10-23 LAB — LDL CHOLESTEROL, DIRECT: Direct LDL: 78 mg/dL (ref <100)

## 2018-10-24 ENCOUNTER — Ambulatory Visit: Payer: 59 | Admitting: Orthopaedic Surgery

## 2018-10-29 ENCOUNTER — Telehealth: Payer: Self-pay | Admitting: Family Medicine

## 2018-10-29 NOTE — Telephone Encounter (Signed)
Abstracted A1c of 9.3 from early July from endocrinology

## 2018-10-30 NOTE — Telephone Encounter (Signed)
Received documentation from [facility] endocrinology.

## 2018-11-01 DIAGNOSIS — Z794 Long term (current) use of insulin: Secondary | ICD-10-CM | POA: Diagnosis not present

## 2018-11-01 DIAGNOSIS — E109 Type 1 diabetes mellitus without complications: Secondary | ICD-10-CM | POA: Diagnosis not present

## 2018-11-01 DIAGNOSIS — Z9641 Presence of insulin pump (external) (internal): Secondary | ICD-10-CM | POA: Diagnosis not present

## 2018-11-01 DIAGNOSIS — Z7189 Other specified counseling: Secondary | ICD-10-CM | POA: Diagnosis not present

## 2018-11-01 DIAGNOSIS — Z6829 Body mass index (BMI) 29.0-29.9, adult: Secondary | ICD-10-CM | POA: Diagnosis not present

## 2018-11-01 DIAGNOSIS — E785 Hyperlipidemia, unspecified: Secondary | ICD-10-CM | POA: Diagnosis not present

## 2018-11-01 MED FILL — HumaLOG 100 UNIT/ML SOLN: 100 | 30 days supply | Qty: 30 | Fill #0

## 2018-11-08 ENCOUNTER — Ambulatory Visit (INDEPENDENT_AMBULATORY_CARE_PROVIDER_SITE_OTHER)
Admission: RE | Admit: 2018-11-08 | Discharge: 2018-11-08 | Disposition: A | Discharge status: Home / Self Care | Payer: 59 | Source: Ambulatory Visit

## 2018-11-08 ENCOUNTER — Ambulatory Visit: Payer: 59 | Admitting: Podiatry

## 2018-11-08 ENCOUNTER — Encounter: Payer: Self-pay | Admitting: Podiatry

## 2018-11-08 ENCOUNTER — Other Ambulatory Visit: Payer: Self-pay

## 2018-11-08 ENCOUNTER — Telehealth: Payer: Self-pay | Admitting: Family Medicine

## 2018-11-08 VITALS — BP 105/66 | HR 63 | Temp 97.3°F | Resp 16

## 2018-11-08 DIAGNOSIS — M62830 Muscle spasm of back: Secondary | ICD-10-CM | POA: Diagnosis not present

## 2018-11-08 DIAGNOSIS — M545 Low back pain, unspecified: Secondary | ICD-10-CM

## 2018-11-08 DIAGNOSIS — B351 Tinea unguium: Secondary | ICD-10-CM | POA: Diagnosis not present

## 2018-11-08 MED ORDER — MELOXICAM 7.5 MG PO TABS: 7.5000 mg | ORAL_TABLET | Freq: Every day | ORAL | 0 refills | Status: DC

## 2018-11-08 MED ORDER — TERBINAFINE HCL 250 MG PO TABS: 250.0000 mg | ORAL_TABLET | Freq: Every day | ORAL | 0 refills | Status: DC

## 2018-11-08 MED ORDER — CYCLOBENZAPRINE HCL 10 MG PO TABS: 10.0000 mg | ORAL_TABLET | Freq: Every day | ORAL | 0 refills | Status: DC

## 2018-11-08 MED FILL — MELOXICAM 7.5 MG TABLET: 7.5 | 20 days supply | Qty: 20 | Fill #0

## 2018-11-08 MED FILL — CYCLOBENZAPRINE HCL 10 MG T: 10 | 12 days supply | Qty: 12 | Fill #0

## 2018-11-08 MED FILL — METFORMIN HCL ER 500 MG TB2: 500 | 90 days supply | Qty: 360 | Fill #1

## 2018-11-08 MED FILL — TERBINAFINE HCL 250 MG TAB: 250 | 90 days supply | Qty: 90 | Fill #0

## 2018-11-08 MED FILL — FREESTYLE LIBRE 14 DAY SENS: 28 days supply | Qty: 2 | Fill #1

## 2018-11-08 NOTE — Telephone Encounter (Signed)
Received office notes from endocrinology Dr. Garnet Koyanagi at [facility].  Notes sent to scan.

## 2018-11-08 NOTE — Progress Notes (Signed)
Subjective:   Patient ID: Nathaniel [name], male   DOB: 53 y.o.   MRN: [id number]   HPI Patient presents stating that he has long-term nail disease that is been going on for years and he had tried some over-the-counter treatment but is never done anything to try to improve it.  Does have long-term diabetes with an A1c which runs between 8 and 9 and patient does not smoke and likes to be active   Review of Systems  All other systems reviewed and are negative.       Objective:  Physical Exam Vitals signs and nursing note reviewed.  Constitutional:      Appearance: He is well-developed.  Pulmonary:     Effort: Pulmonary effort is normal.  Musculoskeletal: Normal range of motion.  Skin:    General: Skin is warm.  Neurological:     Mental Status: He is alert.     Neurovascular status was found to be intact muscle strength was adequate range of motion adequate.  Patient does not appear to have significant signs of neurological disease currently and does have some redness in his digits and has nail disease with yellow discoloration that has started in the right big toenail but spread to adjacent nails with approximate 7 nails of involvement.  Patient has good digital perfusion well oriented x3     Assessment:  Combination of mycotic nail infection bilateral with systemic issues associated with it possible trauma and long-term diabetes which does not currently appear to be creating any manifestations foot wise     Plan:  H&P condition reviewed and I discussed different treatment options and he would like to be aggressive in trying to get this better.  I reviewed his lab work indicating normal liver function studies and I have discussed Lamisil treatment in combination with laser and topical and I explained the treatment that we would do.  Patient wants to pursue this and I went over risk of oral Lamisil treatment and at this point he is placed on 90 days of Lamisil 1 pill/day 250 mg daily  and he is scheduled for laser therapy to commence in the next several weeks.  Patient also will start topical is encouraged to call us with any questions and understands it may take 6 to 9 months for improvement.  Patient also at this time had pictures taken of both feet

## 2018-11-08 NOTE — Progress Notes (Signed)
   Subjective:    Patient ID: Nathaniel [name], male    DOB: [birth year], 54 y.o.   MRN: [id number]  HPI    Review of Systems  All other systems reviewed and are negative.      Objective:   Physical Exam        Assessment & Plan:

## 2018-11-08 NOTE — ED Provider Notes (Signed)
[facility] Virtual Visit via Video Note:  Nathaniel [name]  initiated request for Telemedicine visit with [facility] team. I connected with Nathaniel [name]  on 11/08/2018 at 3:32 PM  for a synchronized telemedicine visit using a video enabled HIPPA compliant telemedicine application. I verified that I am speaking with Nathaniel [name]  using two identifiers. Nathaniel Box, PA-C  was physically located in a [facility] Urgent care site and Nathaniel [name] was located at a different location.   The limitations of evaluation and management by telemedicine as well as the availability of in-person appointments were discussed. Patient was informed that he  may incur a bill ( including co-pay) for this virtual visit encounter. Nathaniel [name]  expressed understanding and gave verbal consent to proceed with virtual visit.   [id number] 11/08/18 Arrival Time: 1523  CC: RT low back pain  SUBJECTIVE: History from: patient. Nathaniel [name] is a 53 y.o. male hx significant for DM type 1, well controlled, complains of right low back pain that began 1 week ago.  Denies a precipitating event or specific injury.  Localizes the pain to the RT low back.  Describes the pain as intermittent, worsening and throbbing in character.  Has tried OTC advil, warm compresses and pain patch without relief.  Denies aggravating factors.  Reports similar symptoms in the past related to "throwing my (his) back out" 15 years ago.  Denies fever, chills, fatigue, chest pain, SOB, nausea, vomiting, weakness, numbness and tingling, saddle paresthesias, loss of bowel or bladder function, dysuria, and hematuria.      ROS: As per HPI.  All other pertinent ROS negative.     Past Medical History:  Diagnosis Date  . Depression   . Diabetes mellitus    type II  . GERD (gastroesophageal reflux disease)   . Headache(784.0)   . Hypogonadism male   . Sleep apnea    Past Surgical History:  Procedure Laterality Date   . NASAL SINUS SURGERY  04/1991  . TONSILLECTOMY  1972   Allergies  Allergen Reactions  . Cymbalta [Duloxetine Hcl] Other (See Comments)    Suicidal thoughts  . Influenza Vaccines Other (See Comments)    Unknown reaction  . Penicillins Other (See Comments)    Unknown - childhood allergy per mother  . Pioglitazone Other (See Comments)      headache  . Promethazine Hcl Nausea And Vomiting       . Reglan [Metoclopramide] Other (See Comments)    Jittery, increased anxiety   No current facility-administered medications on file prior to encounter.    Current Outpatient Medications on File Prior to Encounter  Medication Sig Dispense Refill  . Alcohol Swabs (ALCOHOL PREPS) PADS Use alcohol pads to clean site to be used to check blood sugar. 100 each 11  . AMBULATORY NON FORMULARY MEDICATION Change CPAP setting to 13.5 (14 is ok if 13.5 is not available). Please provide new machine as old machine was not fixable and Mr Poythress has improved AHI with new machine. 1 each 0  . AMBULATORY NON FORMULARY MEDICATION [place]. Use as needed. M25.561 1 each 0  . aspirin EC 81 MG tablet Take 2 tablets (162 mg total) by mouth daily. 90 tablet 3  . Blood Glucose Monitoring Suppl (CONTOUR NEXT MONITOR) w/Device KIT 1 Device by Does not apply route daily. Use to test blood sugar daily 1 kit 1  . Blood Glucose Monitoring Suppl (FREESTYLE LITE) DEVI Use to check  sugar 4 times daily. 1 each 0  . Continuous Blood Gluc Sensor (FREESTYLE LIBRE 14 DAY SENSOR) MISC 1 each by Does not apply route every 14 (fourteen) days. Change every 2 weeks 6 each 3  . GLUCAGON EMERGENCY 1 MG injection INJECT 1 MG INTO THE MUSCLE ONCE AS NEEDED. 1 kit 3  . glucose blood (CONTOUR NEXT TEST) test strip Use as instructed to check blood sugar 5 times daily. 450 each 12  . insulin lispro (HUMALOG) 100 UNIT/ML injection INJECT up to 70 units INTO THE SKIN DAILY-in the insulin pump 30 mL 11  . Lancets (FREESTYLE) lancets Use as instructed to  check sugar 4 times daily. 400 each 5  . lisinopril (ZESTRIL) 2.5 MG tablet     . metFORMIN (GLUCOPHAGE-XR) 500 MG 24 hr tablet TAKE 4 TABLETS BY MOUTH DAILY in the morning 360 tablet 3  . ondansetron (ZOFRAN ODT) 4 MG disintegrating tablet Take one tab by mouth Q6hr prn nausea.  Dissolve under tongue. 12 tablet 0  . rosuvastatin (CRESTOR) 5 MG tablet TAKE 1 TABLET (5 MG TOTAL) BY MOUTH DAILY. 90 tablet 3  . terbinafine (LAMISIL) 250 MG tablet Take 1 tablet (250 mg total) by mouth daily. 90 tablet 0  . Vilazodone HCl (VIIBRYD) 20 MG TABS Take 1 tablet (20 mg total) by mouth daily. 90 tablet 1    OBJECTIVE:  There were no vitals filed for this visit.  General appearance: alert; no distress Eyes: EOMI grossly; wears corrective lenses HENT: normocephalic; atraumatic Neck: supple with FROM Lungs: normal respiratory effort; speaking in full sentences without difficulty Extremities: moves extremities without difficulty Skin: No obvious rashes Neurologic: No facial asymmetries Psychological: alert and cooperative; flat mood and affect   ASSESSMENT & PLAN:  1. Acute right-sided low back pain without sciatica   2. Spasm of muscle of lower back     Meds ordered this encounter  Medications  . meloxicam (MOBIC) 7.5 MG tablet    Sig: Take 1 tablet (7.5 mg total) by mouth daily.    Dispense:  20 tablet    Refill:  0    Order Specific Question:   Supervising Provider    Answer:   Raylene Everts [[id number]]  . cyclobenzaprine (FLEXERIL) 10 MG tablet    Sig: Take 1 tablet (10 mg total) by mouth at bedtime.    Dispense:  12 tablet    Refill:  0    Order Specific Question:   Supervising Provider    Answer:   Raylene Everts [[id number]]    Continue conservative management of rest, ice, heat, and gentle stretches/ massage Take mobic as needed for pain relief (may cause abdominal discomfort, ulcers, and GI bleeds avoid taking with other NSAIDs or at the same time as SSRIs) Take  cyclobenzaprine at nighttime for symptomatic relief. Avoid driving or operating heavy machinery while using medication. Follow up in person or with PCP if symptoms persist Follow up in person or go to the ER if you have any new or worsening symptoms (fever, chills, chest pain, abdominal pain, changes in bowel or bladder habits, pain radiating into lower legs, etc...)   I discussed the assessment and treatment plan with the patient. The patient was provided an opportunity to ask questions and all were answered. The patient agreed with the plan and demonstrated an understanding of the instructions.   The patient was advised to call back or seek an in-person evaluation if the symptoms worsen or if the condition fails to  improve as anticipated.  I provided 10 minutes of non-face-to-face time during this encounter.  [place], PA-C  11/08/2018 3:32 PM     Nathaniel Box, PA-C 11/08/18 1537

## 2018-11-08 NOTE — Discharge Instructions (Addendum)
Continue conservative management of rest, ice, heat, and gentle stretches/ massage Take mobic as needed for pain relief (may cause abdominal discomfort, ulcers, and GI bleeds avoid taking with other NSAIDs or at the same time as SSRIs) Take cyclobenzaprine at nighttime for symptomatic relief. Avoid driving or operating heavy machinery while using medication. Follow up in person or with PCP if symptoms persist Follow up in person or go to the ER if you have any new or worsening symptoms (fever, chills, chest pain, abdominal pain, changes in bowel or bladder habits, pain radiating into lower legs, etc...)

## 2018-11-12 ENCOUNTER — Encounter: Payer: Self-pay | Admitting: Family Medicine

## 2018-11-13 ENCOUNTER — Encounter: Payer: Self-pay | Admitting: Orthopaedic Surgery

## 2018-11-13 ENCOUNTER — Ambulatory Visit (INDEPENDENT_AMBULATORY_CARE_PROVIDER_SITE_OTHER): Payer: 59 | Admitting: Orthopaedic Surgery

## 2018-11-13 VITALS — BP 118/80 | HR 60 | Ht 72.0 in | Wt 217.0 lb

## 2018-11-13 DIAGNOSIS — M25561 Pain in right knee: Secondary | ICD-10-CM

## 2018-11-13 MED ORDER — MELOXICAM 7.5 MG PO TABS: 7.5000 mg | ORAL_TABLET | Freq: Every day | ORAL | 1 refills | Status: DC

## 2018-11-13 NOTE — Progress Notes (Signed)
Office Visit Note   Patient: Nathaniel [name]           Date of Birth: 05/26/1965           MRN: [id number] Visit Date: 11/13/2018              Requested by: Rodolph Bongorey, Evan S, MD [address] [place] [id number] PCP: Rodolph Bongorey, Evan S, MD   Assessment & Plan: Visit Diagnoses:  1. Right knee pain, unspecified chronicity       With impaction injury distal femoral condyle.   Plan: We discussed use of exercise bike or other activities that do not involve higher impact loading of the knee joint surface.  Current gym options are restricted by COVID.  We discussed extremely slow ramp up activities with increased walking distances on a very slow progressive scale to avoid re-aggravation.  He can call if he like to have a repeat MRI for reassessment.  Follow-Up Instructions: Return if symptoms worsen or fail to improve.   Orders:  No orders of the defined types were placed in this encounter.  Meds ordered this encounter  Medications  . meloxicam (MOBIC) 7.5 MG tablet    Sig: Take 1 tablet (7.5 mg total) by mouth daily.    Dispense:  30 tablet    Refill:  1      Procedures: No procedures performed   Clinical Data: No additional findings.   Subjective: Chief Complaint  Patient presents with  . Right Knee - Pain, Follow-up    HPI 53 year old male returns with ongoing problems with his right knee.  Patient originally had hit his knee against the garage and started having right medial knee pain subsequent to that.  MRI scan 08/20/2018 showed marrow edema peripheral femoral condyle consistent with subchondral lineal impaction fracture nondisplaced.  He also had small undersurface tear of the posterior horn at the junction medial meniscus without displacement.  He was treated with crutches for more than a month with limited weightbearing.  He has not noticed progressive improvement in his knee over the last few weeks now that he is off crutches and still at times has  increased knee pain.  Review of Systems positive for diabetes last A1c 9.3 09/26/2018,Possible history of depression.   Objective: Vital Signs: BP 118/80   Pulse 60   Ht 6' (1.829 m)   Wt 217 lb (98.4 kg)   BMI 29.43 kg/m   Physical Exam Constitutional:      Appearance: Normal appearance.  HENT:     Head: Normocephalic.  Eyes:     Extraocular Movements: Extraocular movements intact.     Pupils: Pupils are equal, round, and reactive to light.  Pulmonary:     Effort: Pulmonary effort is normal.  Skin:    General: Skin is warm.     Coloration: Skin is not jaundiced.  Neurological:     Mental Status: He is alert.  Psychiatric:        Mood and Affect: Mood normal.        Thought Content: Thought content normal.        Judgment: Judgment normal.     Ortho Exam patient has medial joint line tenderness on the right knee.  He reaches full extension no effusion is noted.  Negative logroll to the hips.  No cellulitis over the knee.  No significant crepitus with knee extension he comes to full extension.  Specialty Comments:  No specialty comments available.  Imaging:  No results found.   PMFS History: Patient Active Problem List   Diagnosis Date Noted  . Pain in right knee 08/07/2018  . GAD (generalized anxiety disorder) 07/03/2017  . Obstructive sleep apnea 06/09/2016  . Type 1 diabetes mellitus, uncontrolled ([place]) 10/02/2015  . 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 Cervical radiculitis 01/25/2015  . BPH (benign prostatic hyperplasia) 09/12/2014  . Neck pain 02/27/2013  . Chronic headaches 09/28/2011  . Seasonal and perennial allergic rhinitis 09/28/2011  . FATIGUE, CHRONIC 12/16/2008   Past Medical History:  Diagnosis Date  . Depression   . Diabetes mellitus    type II  . GERD (gastroesophageal reflux disease)   . Headache(784.0)   . Hypogonadism male   . Sleep apnea     Family History  Adopted: Yes  Problem Relation Age of Onset  .  Cancer Father 85       Prostate cancer  . Healthy Mother   . Diabetes Mellitus I Son     Past Surgical History:  Procedure Laterality Date  . NASAL SINUS SURGERY  04/1991  . TONSILLECTOMY  1972   Social History   Occupational History    Employer: [employer]    Comment: [employer]  Tobacco Use  . Smoking status: Never Smoker  . Smokeless tobacco: Never Used  Substance and Sexual Activity  . Alcohol use: No  . Drug use: No  . Sexual activity: Not on file

## 2018-11-14 ENCOUNTER — Encounter: Payer: Self-pay | Admitting: Family Medicine

## 2018-11-14 DIAGNOSIS — H2513 Age-related nuclear cataract, bilateral: Secondary | ICD-10-CM | POA: Diagnosis not present

## 2018-11-14 DIAGNOSIS — I10 Essential (primary) hypertension: Secondary | ICD-10-CM | POA: Diagnosis not present

## 2018-11-14 DIAGNOSIS — E119 Type 2 diabetes mellitus without complications: Secondary | ICD-10-CM | POA: Diagnosis not present

## 2018-11-18 ENCOUNTER — Encounter: Payer: Self-pay | Admitting: Family Medicine

## 2018-11-20 ENCOUNTER — Telehealth: Payer: Self-pay | Admitting: Family Medicine

## 2018-11-20 NOTE — Telephone Encounter (Signed)
Received CPAP compliance report.  Date of service was 10/20/2018-11/18/2018. Patient use CPAP 30/30 days.  His average total hours per day was 7 hours and 58 minutes.  Set pressure was 14 cm of water.  AHI 0.1 obstructive, 3.7 central.  2% Cheyne-Stokes respiration. Report will be sent to scan.

## 2018-11-21 ENCOUNTER — Other Ambulatory Visit: Payer: Self-pay

## 2018-11-21 ENCOUNTER — Encounter: Payer: Self-pay | Admitting: Podiatry

## 2018-11-21 ENCOUNTER — Ambulatory Visit: Payer: 59

## 2018-11-21 DIAGNOSIS — B351 Tinea unguium: Secondary | ICD-10-CM

## 2018-11-21 NOTE — Progress Notes (Signed)
No laser done, patient could not afford

## 2018-11-22 DIAGNOSIS — Z7189 Other specified counseling: Secondary | ICD-10-CM | POA: Diagnosis not present

## 2018-11-22 DIAGNOSIS — Z9641 Presence of insulin pump (external) (internal): Secondary | ICD-10-CM | POA: Diagnosis not present

## 2018-11-22 DIAGNOSIS — Z6829 Body mass index (BMI) 29.0-29.9, adult: Secondary | ICD-10-CM | POA: Diagnosis not present

## 2018-11-22 DIAGNOSIS — E785 Hyperlipidemia, unspecified: Secondary | ICD-10-CM | POA: Diagnosis not present

## 2018-11-22 DIAGNOSIS — E109 Type 1 diabetes mellitus without complications: Secondary | ICD-10-CM | POA: Diagnosis not present

## 2018-11-22 DIAGNOSIS — Z794 Long term (current) use of insulin: Secondary | ICD-10-CM | POA: Diagnosis not present

## 2018-11-22 MED FILL — GLUCAGON 1 MG EMERGENCY KIT: 1 | 2 days supply | Qty: 2 | Fill #0

## 2018-11-27 ENCOUNTER — Telehealth: Payer: Self-pay | Admitting: Family Medicine

## 2018-11-27 NOTE — Telephone Encounter (Signed)
Received notes from [facility] endocrinology.  Patient was having evening lows and his insulin pump settings were changed a bit.  Metformin was continued.

## 2018-11-30 MED FILL — FREESTYLE LIBRE 14 DAY SENS: 28 days supply | Qty: 2 | Fill #2

## 2018-12-02 ENCOUNTER — Encounter: Payer: Self-pay | Admitting: Family Medicine

## 2018-12-03 ENCOUNTER — Emergency Department
Admission: EM | Admit: 2018-12-03 | Discharge: 2018-12-03 | Disposition: A | Discharge status: Home / Self Care | Payer: Self-pay | Source: Home / Self Care

## 2018-12-03 ENCOUNTER — Other Ambulatory Visit: Payer: Self-pay

## 2018-12-03 DIAGNOSIS — S80812A Abrasion, left lower leg, initial encounter: Secondary | ICD-10-CM

## 2018-12-03 DIAGNOSIS — W548XXA Other contact with dog, initial encounter: Secondary | ICD-10-CM

## 2018-12-03 MED ORDER — CEPHALEXIN 500 MG PO CAPS: 500.0000 mg | ORAL_CAPSULE | Freq: Two times a day (BID) | ORAL | 0 refills | Status: DC

## 2018-12-03 MED FILL — CEPHALEXIN 500 MG CAPSULE: 500 | 7 days supply | Qty: 14 | Fill #0

## 2018-12-03 MED FILL — ROSUVASTATIN CALCIUM 5 MG T: 5 | 90 days supply | Qty: 90 | Fill #1

## 2018-12-03 NOTE — ED Triage Notes (Signed)
Friday was at a client's house, and a medium size dog jumped on him and scratched his left leg from the knee almost to the bottom of the calf muscle.  Pt is a diabetic and is concerned about possible infection.

## 2018-12-03 NOTE — ED Provider Notes (Signed)
N[facility]iel [name]    CSN: [id number] Arrival date & time: 12/03/18  1051      History   Chief Complaint Chief Complaint  Patient presents with  . Leg Injury    HPI Nathaniel [name] is a 53 y.o. male.   HPI Nathaniel [name] is a 53 y.o. male presenting to UC with c/o abrasion to his Left leg/medial calf that occurred 3 days ago. Pt was at a client's house when the client's medium size dog jumped onto to the patient, causing a scratch from his knee to mid calf. Pt has been using OTC antibiotic ointment but is concerned the area is still a little painful at times with certain leg movements. Pt has a hx of diabetes and is concerned for infection.  Denies fever or chills. No swelling or drainage of pus from the wound. Denies being bit by the dog.   Past Medical History:  Diagnosis Date  . Depression   . Diabetes mellitus    type II  . GERD (gastroesophageal reflux disease)   . Headache(784.0)   . Hypogonadism male   . Sleep apnea     Patient Active Problem List   Diagnosis Date Noted  . Pain in right knee 08/07/2018  . GAD (generalized anxiety disorder) 07/03/2017  . Obstructive sleep apnea 06/09/2016  . Type 1 diabetes mellitus, uncontrolled ([place]) 10/02/2015  . 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 Cervical radiculitis 01/25/2015  . BPH (benign prostatic hyperplasia) 09/12/2014  . Neck pain 02/27/2013  . Chronic headaches 09/28/2011  . Seasonal and perennial allergic rhinitis 09/28/2011  . FATIGUE, CHRONIC 12/16/2008    Past Surgical History:  Procedure Laterality Date  . NASAL SINUS SURGERY  04/1991  . TONSILLECTOMY  1972       Home Medications    Prior to Admission medications   Medication Sig Start Date End Date Taking? Authorizing Provider  Alcohol Swabs (ALCOHOL PREPS) PADS Use alcohol pads to clean site to be used to check blood sugar. 05/02/14   Philemon Kingdom, MD  AMBULATORY NON FORMULARY MEDICATION Change CPAP setting to  13.5 (14 is ok if 13.5 is not available). Please provide new machine as old machine was not fixable and Nathaniel [name] has improved AHI with new machine. 07/19/18   Gregor Hams, MD  AMBULATORY NON FORMULARY MEDICATION Gilford Rile. Use as needed. M25.561 09/04/18   Gregor Hams, MD  aspirin EC 81 MG tablet Take 2 tablets (162 mg total) by mouth daily. 07/13/17   Revankar, Reita Cliche, MD  Blood Glucose Monitoring Suppl (CONTOUR NEXT MONITOR) w/Device KIT 1 Device by Does not apply route daily. Use to test blood sugar daily 04/03/18   Philemon Kingdom, MD  Blood Glucose Monitoring Suppl (FREESTYLE LITE) DEVI Use to check sugar 4 times daily. 04/27/16   Philemon Kingdom, MD  cephALEXin (KEFLEX) 500 MG capsule Take 1 capsule (500 mg total) by mouth 2 (two) times daily. 12/03/18   Noe Gens, PA-C  Continuous Blood Gluc Sensor (FREESTYLE LIBRE 14 DAY SENSOR) MISC 1 each by Does not apply route every 14 (fourteen) days. Change every 2 weeks 10/12/17   Philemon Kingdom, MD  cyclobenzaprine (FLEXERIL) 10 MG tablet Take 1 tablet (10 mg total) by mouth at bedtime. 11/08/18   Wurst, [place], PA-C  GLUCAGON EMERGENCY 1 MG injection INJECT 1 MG INTO THE MUSCLE ONCE AS NEEDED. 06/23/17   Philemon Kingdom, MD  glucose blood (CONTOUR NEXT TEST) test strip Use as instructed  to check blood sugar 5 times daily. 09/12/17   Philemon Kingdom, MD  insulin lispro (HUMALOG) 100 UNIT/ML injection INJECT up to 70 units INTO THE SKIN DAILY-in the insulin pump 08/10/18   Philemon Kingdom, MD  Lancets (FREESTYLE) lancets Use as instructed to check sugar 4 times daily. 04/27/16   Philemon Kingdom, MD  lisinopril (ZESTRIL) 2.5 MG tablet  09/24/18   [provider]  meloxicam (MOBIC) 7.5 MG tablet Take 1 tablet (7.5 mg total) by mouth daily. 11/08/18   Wurst, [place], PA-C  meloxicam (MOBIC) 7.5 MG tablet Take 1 tablet (7.5 mg total) by mouth daily. 11/13/18   Marybelle Killings, MD  metFORMIN (GLUCOPHAGE-XR) 500 MG 24 hr tablet TAKE 4 TABLETS BY  MOUTH DAILY in the morning 01/04/18   Philemon Kingdom, MD  ondansetron (ZOFRAN ODT) 4 MG disintegrating tablet Take one tab by mouth Q6hr prn nausea.  Dissolve under tongue. 03/06/17   Kandra Nicolas, MD  rosuvastatin (CRESTOR) 5 MG tablet TAKE 1 TABLET (5 MG TOTAL) BY MOUTH DAILY. 08/02/18   Gregor Hams, MD  terbinafine (LAMISIL) 250 MG tablet Take 1 tablet (250 mg total) by mouth daily. 11/08/18   Wallene Huh, DPM  Vilazodone HCl (VIIBRYD) 20 MG TABS Take 1 tablet (20 mg total) by mouth daily. 07/02/18   Gregor Hams, MD    Family History Family History  Adopted: Yes  Problem Relation Age of Onset  . Cancer Father 33       Prostate cancer  . Healthy Mother   . Diabetes Mellitus I Son     Social History Social History   Tobacco Use  . Smoking status: Never Smoker  . Smokeless tobacco: Never Used  Substance Use Topics  . Alcohol use: No  . Drug use: No     Allergies   Cymbalta [duloxetine hcl], Influenza vaccines, Penicillins, Pioglitazone, Promethazine hcl, and Reglan [metoclopramide]   Review of Systems Review of Systems  Constitutional: Negative for chills and fever.  Musculoskeletal: Negative for arthralgias, gait problem, joint swelling and myalgias.  Skin: Positive for wound. Negative for color change.     Physical Exam Triage Vital Signs ED Triage Vitals  Enc Vitals Group     BP 12/03/18 1112 106/72     Pulse Rate 12/03/18 1112 86     Resp 12/03/18 1112 20     Temp 12/03/18 1112 98 F (36.7 C)     Temp Source 12/03/18 1112 Oral     SpO2 12/03/18 1112 98 %     Weight 12/03/18 1113 217 lb (98.4 kg)     Height 12/03/18 1113 6' (1.829 m)     Head Circumference --      Peak Flow --      Pain Score 12/03/18 1113 0     Pain Loc --      Pain Edu? --      Excl. in [place]? --    No data found.  Updated Vital Signs BP 106/72 (BP Location: Right Arm)   Pulse 86   Temp 98 F (36.7 C) (Oral)   Resp 20   Ht 6' (1.829 m)   Wt 217 lb (98.4 kg)   SpO2  98%   BMI 29.43 kg/m   Visual Acuity Right Eye Distance:   Left Eye Distance:   Bilateral Distance:    Right Eye Near:   Left Eye Near:    Bilateral Near:     Physical Exam Vitals signs and nursing  note reviewed.  Constitutional:      Appearance: Normal appearance. He is well-developed.  HENT:     Head: Normocephalic and atraumatic.  Neck:     Musculoskeletal: Normal range of motion.  Cardiovascular:     Rate and Rhythm: Normal rate.  Pulmonary:     Effort: Pulmonary effort is normal.  Musculoskeletal: Normal range of motion.        General: No swelling or tenderness.     Comments: Left leg: no edema, full ROM  Skin:    General: Skin is warm and dry.       Neurological:     General: No focal deficit present.     Mental Status: He is alert and oriented to person, place, and time.  Psychiatric:        Behavior: Behavior normal.      UC Treatments / Results  Labs (all labs ordered are listed, but only abnormal results are displayed) Labs Reviewed - No data to display  EKG   Radiology No results found.  Procedures Procedures (including critical care time)  Medications Ordered in UC Medications - No data to display  Initial Impression / Assessment and Plan / UC Course  I have reviewed the triage vital signs and the nursing notes.  Pertinent labs & imaging results that were available during my care of the patient were reviewed by me and considered in my medical decision making (see chart for details).     Wound appears to be healing well, however, due to patients concern for possible infection due to hx of Type 1 DM, will start pt on empiric tx of Keflex AVS provided.  Final Clinical Impressions(s) / UC Diagnoses   Final diagnoses:  Dog scratch  Abrasion of left leg, initial encounter     Discharge Instructions      Keep wound clean with warm water and soap. You do not need to use alcohol or peroxide at this time.  You may continue to use  over the counter antibiotic ointment and start taking the oral antibiotic to help prevent infection.  Please follow up with your family doctor for recheck of symptoms if not improving later this week, sooner if symptoms worsening- swelling, fever, drainage of pus.    ED Prescriptions    Medication Sig Dispense Auth. Provider   cephALEXin (KEFLEX) 500 MG capsule Take 1 capsule (500 mg total) by mouth 2 (two) times daily. 14 capsule Noe Gens, PA-C     Controlled Substance Prescriptions [place] Controlled Substance Registry consulted? Not Applicable   Tyrell Antonio 12/03/18 1217

## 2018-12-03 NOTE — Discharge Instructions (Signed)
°  Keep wound clean with warm water and soap. You do not need to use alcohol or peroxide at this time.  You may continue to use over the counter antibiotic ointment and start taking the oral antibiotic to help prevent infection.  Please follow up with your family doctor for recheck of symptoms if not improving later this week, sooner if symptoms worsening- swelling, fever, drainage of pus.

## 2018-12-04 MED FILL — GLUCAGON 1 MG EMERGENCY KIT: 1 | 2 days supply | Qty: 2 | Fill #0

## 2018-12-13 DIAGNOSIS — E785 Hyperlipidemia, unspecified: Secondary | ICD-10-CM | POA: Diagnosis not present

## 2018-12-13 DIAGNOSIS — Z7189 Other specified counseling: Secondary | ICD-10-CM | POA: Diagnosis not present

## 2018-12-13 DIAGNOSIS — Z6829 Body mass index (BMI) 29.0-29.9, adult: Secondary | ICD-10-CM | POA: Diagnosis not present

## 2018-12-13 DIAGNOSIS — Z9641 Presence of insulin pump (external) (internal): Secondary | ICD-10-CM | POA: Diagnosis not present

## 2018-12-13 DIAGNOSIS — E1065 Type 1 diabetes mellitus with hyperglycemia: Secondary | ICD-10-CM | POA: Diagnosis not present

## 2018-12-13 DIAGNOSIS — E109 Type 1 diabetes mellitus without complications: Secondary | ICD-10-CM | POA: Diagnosis not present

## 2018-12-13 DIAGNOSIS — Z794 Long term (current) use of insulin: Secondary | ICD-10-CM | POA: Diagnosis not present

## 2018-12-18 ENCOUNTER — Encounter: Payer: Self-pay | Admitting: Family Medicine

## 2018-12-18 DIAGNOSIS — F325 Major depressive disorder, single episode, in full remission: Secondary | ICD-10-CM

## 2018-12-18 DIAGNOSIS — F411 Generalized anxiety disorder: Secondary | ICD-10-CM

## 2018-12-19 DIAGNOSIS — G4733 Obstructive sleep apnea (adult) (pediatric): Secondary | ICD-10-CM | POA: Diagnosis not present

## 2018-12-27 MED FILL — HumaLOG 100 UNIT/ML SOLN: 100 | 30 days supply | Qty: 30 | Fill #1

## 2019-01-01 DIAGNOSIS — E109 Type 1 diabetes mellitus without complications: Secondary | ICD-10-CM | POA: Diagnosis not present

## 2019-01-09 MED FILL — FREESTYLE LIBRE 14 DAY SENS: 28 days supply | Qty: 2 | Fill #3

## 2019-01-10 MED FILL — metFORMIN HCL 1000 MG TABS: 1000 | 30 days supply | Qty: 60 | Fill #0

## 2019-01-16 MED FILL — LISINOPRIL 2.5 MG TABLET: 2.5 | 90 days supply | Qty: 90 | Fill #2

## 2019-01-18 ENCOUNTER — Telehealth: Payer: 59 | Admitting: Physician Assistant

## 2019-01-18 DIAGNOSIS — R39198 Other difficulties with micturition: Secondary | ICD-10-CM

## 2019-01-18 NOTE — Progress Notes (Signed)
Hi Jeff, Thank you for the details you included in the comment boxes. Those details are very helpful in providing the best care.  There are a few things which can cause these symptoms. In order to treat to most appropriately I would feel more comfortable if you were seen by a medical provider in person. Lab work and a physical exam may very well be needed.     Based on what you shared with me, I feel your condition warrants further evaluation and I recommend that you be seen for a face to face visit.  Please contact your primary care physician practice to be seen. Many offices offer virtual options to be seen via video if you are not comfortable going in person to a medical facility at this time.  If you do not have a PCP, [facility] offers a free physician referral service available at [phone]. Our trained staff has the experience, knowledge and resources to put you in touch with a physician who is right for you.   You also have the option of a video visit through [email or web address]  If you are having a true medical emergency please call 911.  NOTE: If you entered your credit card information for this eVisit, you will not be charged. You may see a "hold" on your card for the $35 but that hold will drop off and you will not have a charge processed.  Your e-visit answers were reviewed by a board certified advanced clinical practitioner to complete your personal care plan.  Thank you for using e-Visits.

## 2019-01-21 ENCOUNTER — Ambulatory Visit: Payer: 59 | Admitting: Podiatry

## 2019-01-22 ENCOUNTER — Telehealth: Payer: 59 | Admitting: Nurse Practitioner

## 2019-01-22 DIAGNOSIS — R21 Rash and other nonspecific skin eruption: Secondary | ICD-10-CM

## 2019-01-22 NOTE — Progress Notes (Signed)
Based on what you shared with me it looks like you have areas on scrotom,that should be evaluated in a face to face office visit.  I can not make a definitive diagnosis based on your questionnaire. This does not however sound like lock itch. You will need a face to facr visit for proper treatment.    NOTE: If you entered your credit card information for this eVisit, you will not be charged. You may see a "hold" on your card for the $35 but that hold will drop off and you will not have a charge processed.  If you are having a true medical emergency please call 911.     For an urgent face to face visit, [place] has four urgent care centers for your convenience:   . [facility]    [phone]                  Get Driving Directions  [address], [place] [address] . 10 am to 8 pm Monday-Friday . 12 pm to 8 pm Saturday-Sunday   . [facility] at [place]                  Get Driving Directions  [address], [place], [place] [address] . 8 am to 8 pm Monday-Friday . 9 am to 6 pm Saturday . 11 am to 6 pm Sunday   . [facility] at [place]                  Get Driving Directions   [address] [place], [place] [address] . 8 am to 8 pm Monday-Friday . 8 am to 4 pm Saturday-Sunday    . [facility] at [place]                    Get Driving Directions  [phone]  [address], [place] [address]  . Monday-Friday, 12 PM to 6 PM    Your e-visit answers were reviewed by a board certified advanced clinical practitioner to complete your personal care plan.  Thank you for using e-Visits.

## 2019-01-25 ENCOUNTER — Encounter: Payer: Self-pay | Admitting: Sports Medicine

## 2019-01-25 ENCOUNTER — Ambulatory Visit (INDEPENDENT_AMBULATORY_CARE_PROVIDER_SITE_OTHER): Payer: 59 | Admitting: Sports Medicine

## 2019-01-25 ENCOUNTER — Other Ambulatory Visit: Payer: Self-pay

## 2019-01-25 DIAGNOSIS — R21 Rash and other nonspecific skin eruption: Secondary | ICD-10-CM

## 2019-01-25 MED ORDER — CLOTRIMAZOLE-BETAMETHASONE 1-0.05 % EX CREA: 1.0000 "application " | TOPICAL_CREAM | Freq: Two times a day (BID) | CUTANEOUS | 0 refills | Status: DC

## 2019-01-25 MED FILL — HumaLOG 100 UNIT/ML SOLN: 100 | 30 days supply | Qty: 30 | Fill #2

## 2019-01-25 MED FILL — CLOTRIMAZOLE-BETAMETHASONE: 1-0.05 | 14 days supply | Qty: 45 | Fill #0

## 2019-01-25 NOTE — Progress Notes (Signed)
Subjective:    CC: Scrotal rash  HPI: This is a pleasant 53 year old male, for the past several weeks has had a rash on his scrotum, left worse than right, somewhat pruritic with a little burning sensation, no penile discharge, no burning with urination.  He tried a couple of days of Lotrimin without much improvement.  Symptoms are moderate, persistent, localized without radiation.  I reviewed the past medical history, family history, social history, surgical history, and allergies today and no changes were needed.  Please see the problem list section below in epic for further details.  Past Medical History: Past Medical History:  Diagnosis Date  . Depression   . Diabetes mellitus    type II  . GERD (gastroesophageal reflux disease)   . Headache(784.0)   . Hypogonadism male   . Sleep apnea    Past Surgical History: Past Surgical History:  Procedure Laterality Date  . NASAL SINUS SURGERY  04/1991  . TONSILLECTOMY  1972   Social History: Social History   Socioeconomic History  . Marital status: Married    Spouse name: [name]  . Number of children: 2  . Years of education: Not on file  . Highest education level: Not on file  Occupational History    Employer: [employer]    Comment: Mental Health Case Management  Social Needs  . Financial resource strain: Not on file  . Food insecurity    Worry: Not on file    Inability: Not on file  . Transportation needs    Medical: Not on file    Non-medical: Not on file  Tobacco Use  . Smoking status: Never Smoker  . Smokeless tobacco: Never Used  Substance and Sexual Activity  . Alcohol use: No  . Drug use: No  . Sexual activity: Not on file  Lifestyle  . Physical activity    Days per week: Not on file    Minutes per session: Not on file  . Stress: Not on file  Relationships  . Social Herbalist on phone: Not on file    Gets together: Not on file    Attends religious service: Not on file    Active member  of club or organization: Not on file    Attends meetings of clubs or organizations: Not on file    Relationship status: Not on file  Other Topics Concern  . Not on file  Social History Narrative   He lives with wife and two children.   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   Highest level of education: Restaurant manager, fast food degree   Family History: Family History  Adopted: Yes  Problem Relation Age of Onset  . Cancer Father 10       Prostate cancer  . Healthy Mother   . Diabetes Mellitus I Son    Allergies: Allergies  Allergen Reactions  . Cymbalta [Duloxetine Hcl] Other (See Comments)    Suicidal thoughts  . Influenza Vaccines Other (See Comments)    Unknown reaction  . Penicillins Other (See Comments)    Unknown - childhood allergy per mother  . Pioglitazone Other (See Comments)      headache  . Promethazine Hcl Nausea And Vomiting       . Reglan [Metoclopramide] Other (See Comments)    Jittery, increased anxiety   Medications: See med rec.  Review of Systems: No fevers, chills, night sweats, weight loss, chest pain, or shortness of breath.   Objective:  General: Well Developed, well nourished, and in no acute distress.  Neuro: Alert and oriented x3, extra-ocular muscles intact, sensation grossly intact.  HEENT: Normocephalic, atraumatic, pupils equal round reactive to light, neck supple, no masses, no lymphadenopathy, thyroid nonpalpable.  Skin: Warm and dry, no rashes. Cardiac: Regular rate and rhythm, no murmurs rubs or gallops, no lower extremity edema.  Respiratory: Clear to auscultation bilaterally. Not using accessory muscles, speaking in full sentences. Scrotum: Circular hypopigmented areas along the scrotum, no bordering scaliness.  Impression and Recommendations:    Scrotal rash There is a small here, at the top of the differential is tinea cruris. Adding topical Lotrisone to be used twice a day for at least 2 to 4 weeks. He  did try Lotrimin for only a couple of days which was not surprisingly ineffective. 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 If not then at the 1 month follow-up we will do a biopsy.    ___________________________________________ Ihor Austin. Benjamin Stain, M.D., ABFM., CAQSM. Primary Care and Sports Medicine [facility]  Adjunct Professor of Family Medicine  [employer]

## 2019-01-25 NOTE — Assessment & Plan Note (Signed)
There is a small here, at the top of the differential is tinea cruris. Adding topical Lotrisone to be used twice a day for at least 2 to 4 weeks. He did try Lotrimin for only a couple of days which was not surprisingly ineffective. 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 If not then at the 1 month follow-up we will do a biopsy.

## 2019-01-28 ENCOUNTER — Encounter: Payer: Self-pay | Admitting: Sports Medicine

## 2019-01-29 ENCOUNTER — Ambulatory Visit (INDEPENDENT_AMBULATORY_CARE_PROVIDER_SITE_OTHER): Payer: 59 | Admitting: Sports Medicine

## 2019-01-29 DIAGNOSIS — F329 Major depressive disorder, single episode, unspecified: Secondary | ICD-10-CM

## 2019-01-29 DIAGNOSIS — F419 Anxiety disorder, unspecified: Secondary | ICD-10-CM

## 2019-01-29 DIAGNOSIS — F32A Depression, unspecified: Secondary | ICD-10-CM

## 2019-01-29 MED ORDER — FLUOXETINE HCL 20 MG PO CAPS: 20.0000 mg | ORAL_CAPSULE | Freq: Every day | ORAL | 3 refills | Status: DC

## 2019-01-29 MED FILL — FLUoxetine HCL 20 MG CAPS: 20 | 30 days supply | Qty: 30 | Fill #0

## 2019-01-29 NOTE — Assessment & Plan Note (Signed)
Currently experiencing lots of flux in his professional life, switching jobs, and having increasing anxiety, no current depressive symptoms. He has had suicidal ideation in the past. It sounds like he has been on multiple antidepressants in the past, and has had bad responses to Lexapro, Viibryd, Zoloft. He is agreeable to try Prozac, he declines any emergency anxiolytics, he denies any suicidal or homicidal ideation.

## 2019-01-29 NOTE — Progress Notes (Signed)
Virtual Visit via WebEx/MyChart   I connected with  Nathaniel [name]  on 01/29/19 via WebEx/MyChart/Doximity Video and verified that I am speaking with the correct person using two identifiers.   I discussed the limitations, risks, security and privacy concerns of performing an evaluation and management service by WebEx/MyChart/Doximity Video, including the higher likelihood of inaccurate diagnosis and treatment, and the availability of in person appointments.  We also discussed the likely need of an additional face to face encounter for complete and high quality delivery of care.  I also discussed with the patient that there may be a patient responsible charge related to this service. The patient expressed understanding and wishes to proceed.  Provider location is either at home or medical facility. Patient location is at their home, different from provider location. People involved in care of the patient during this telehealth encounter were myself, my nurse/medical assistant, and my front office/scheduling team member.  Subjective:    CC: Anxiety  HPI: See below for further details.  I reviewed the past medical history, family history, social history, surgical history, and allergies today and no changes were needed.  Please see the problem list section below in epic for further details.  Past Medical History: Past Medical History:  Diagnosis Date  . Depression   . Diabetes mellitus    type II  . GERD (gastroesophageal reflux disease)   . Headache(784.0)   . Hypogonadism male   . Sleep apnea    Past Surgical History: Past Surgical History:  Procedure Laterality Date  . NASAL SINUS SURGERY  04/1991  . TONSILLECTOMY  1972   Social History: Social History   Socioeconomic History  . Marital status: Married    Spouse name: Asencion Islam  . Number of children: 2  . Years of education: Not on file  . Highest education level: Not on file  Occupational History    Employer: [employer]    Comment: Mental Health Case Management  Social Needs  . Financial resource strain: Not on file  . Food insecurity    Worry: Not on file    Inability: Not on file  . Transportation needs    Medical: Not on file    Non-medical: Not on file  Tobacco Use  . Smoking status: Never Smoker  . Smokeless tobacco: Never Used  Substance and Sexual Activity  . Alcohol use: No  . Drug use: No  . Sexual activity: Not on file  Lifestyle  . Physical activity    Days per week: Not on file    Minutes per session: Not on file  . Stress: Not on file  Relationships  . Social Musician on phone: Not on file    Gets together: Not on file    Attends religious service: Not on file    Active member of club or organization: Not on file    Attends meetings of clubs or organizations: Not on file    Relationship status: Not on file  Other Topics Concern  . Not on file  Social History Narrative   He lives with wife and two children.   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   Highest level of education: Child psychotherapist degree   Family History: Family History  Adopted: Yes  Problem Relation Age of Onset  . Cancer Father 59       Prostate cancer  . Healthy Mother   . Diabetes Mellitus I Son  Allergies: Allergies  Allergen Reactions  . Cymbalta [Duloxetine Hcl] Other (See Comments)    Suicidal thoughts  . Influenza Vaccines Other (See Comments)    Unknown reaction  . Penicillins Other (See Comments)    Unknown - childhood allergy per mother  . Pioglitazone Other (See Comments)      headache  . Promethazine Hcl Nausea And Vomiting       . Reglan [Metoclopramide] Other (See Comments)    Jittery, increased anxiety   Medications: See med rec.  Review of Systems: No fevers, chills, night sweats, weight loss, chest pain, or shortness of breath.   Objective:    General: Speaking full sentences, no audible heavy breathing.  Sounds alert  and appropriately interactive.  Appears well.  Face symmetric.  Extraocular movements intact.  Pupils equal and round.  No nasal flaring or accessory muscle use visualized.  No other physical exam performed due to the non-physical nature of this visit.  Impression and Recommendations:    Anxiety and depression Currently experiencing lots of flux in his professional life, switching jobs, and having increasing anxiety, no current depressive symptoms. He has had suicidal ideation in the past. It sounds like he has been on multiple antidepressants in the past, and has had bad responses to Lexapro, Viibryd, Zoloft. He is agreeable to try Prozac, he declines any emergency anxiolytics, he denies any suicidal or homicidal ideation.   I discussed the above assessment and treatment plan with the patient. The patient was provided an opportunity to ask questions and all were answered. The patient agreed with the plan and demonstrated an understanding of the instructions.   The patient was advised to call back or seek an in-person evaluation if the symptoms worsen or if the condition fails to improve as anticipated.   I provided 25 minutes of non-face-to-face time during this encounter, 15 minutes of additional time was needed to gather information, review chart, records, communicate/coordinate with staff remotely, troubleshooting the multiple errors that we get every time when trying to do video calls through the electronic medical record, WebEx, and Doximity, restart the encounter multiple times due to instability of the software, as well as complete documentation.   ___________________________________________ Gwen Her. Dianah Field, M.D., ABFM., CAQSM. Primary Care and Sports Medicine [facility]  Adjunct Professor of [employer] [facility] [employer]

## 2019-01-31 DIAGNOSIS — Z7189 Other specified counseling: Secondary | ICD-10-CM | POA: Diagnosis not present

## 2019-01-31 DIAGNOSIS — Z6829 Body mass index (BMI) 29.0-29.9, adult: Secondary | ICD-10-CM | POA: Diagnosis not present

## 2019-01-31 DIAGNOSIS — E109 Type 1 diabetes mellitus without complications: Secondary | ICD-10-CM | POA: Diagnosis not present

## 2019-01-31 DIAGNOSIS — Z9641 Presence of insulin pump (external) (internal): Secondary | ICD-10-CM | POA: Diagnosis not present

## 2019-01-31 DIAGNOSIS — E785 Hyperlipidemia, unspecified: Secondary | ICD-10-CM | POA: Diagnosis not present

## 2019-01-31 DIAGNOSIS — Z794 Long term (current) use of insulin: Secondary | ICD-10-CM | POA: Diagnosis not present

## 2019-02-04 MED FILL — KETONE CARE TEST STRIPS: 50 days supply | Qty: 50 | Fill #0

## 2019-02-04 MED FILL — ONDANSETRON HCL 4 MG TABLET: 4 | 30 days supply | Qty: 30 | Fill #0

## 2019-02-04 MED FILL — FLUARIX QUADRIVALENT 0.5 ML: 0.5 | 1 days supply | Qty: 1 | Fill #0

## 2019-02-06 ENCOUNTER — Encounter: Payer: Self-pay | Admitting: Sports Medicine

## 2019-02-08 MED FILL — FREESTYLE LIBRE 14 DAY SENS: 28 days supply | Qty: 2 | Fill #4

## 2019-02-09 ENCOUNTER — Encounter: Payer: Self-pay | Admitting: Sports Medicine

## 2019-02-11 ENCOUNTER — Encounter: Payer: Self-pay | Admitting: Sports Medicine

## 2019-02-11 DIAGNOSIS — F32A Depression, unspecified: Secondary | ICD-10-CM

## 2019-02-11 DIAGNOSIS — F329 Major depressive disorder, single episode, unspecified: Secondary | ICD-10-CM

## 2019-02-11 MED ORDER — ALPRAZOLAM 0.5 MG PO TABS: 0.5000 mg | ORAL_TABLET | Freq: Every day | ORAL | 0 refills | Status: DC | PRN

## 2019-02-11 MED FILL — ALPRAZolam 0.5 MG TABS: 0.5 | 10 days supply | Qty: 10 | Fill #0

## 2019-02-19 ENCOUNTER — Encounter: Payer: Self-pay | Admitting: Sports Medicine

## 2019-02-22 ENCOUNTER — Ambulatory Visit: Payer: 59 | Admitting: Sports Medicine

## 2019-02-25 DIAGNOSIS — E1065 Type 1 diabetes mellitus with hyperglycemia: Secondary | ICD-10-CM | POA: Diagnosis not present

## 2019-02-25 MED FILL — FLUoxetine HCL 20 MG CAPS: 20 | 30 days supply | Qty: 30 | Fill #1

## 2019-03-12 ENCOUNTER — Ambulatory Visit: Payer: 59 | Admitting: Orthopaedic Surgery

## 2019-03-12 MED FILL — FIASP 100 UNIT/ML SOLN: 100 | 28 days supply | Qty: 20 | Fill #0

## 2019-03-13 DIAGNOSIS — E1065 Type 1 diabetes mellitus with hyperglycemia: Secondary | ICD-10-CM | POA: Diagnosis not present

## 2019-03-13 DIAGNOSIS — G4733 Obstructive sleep apnea (adult) (pediatric): Secondary | ICD-10-CM | POA: Diagnosis not present

## 2019-03-19 ENCOUNTER — Other Ambulatory Visit: Payer: Self-pay | Admitting: Internal Medicine

## 2019-03-19 MED FILL — CONTOUR NEXT STRIPS: 90 days supply | Qty: 450 | Fill #0

## 2019-03-19 MED FILL — ROSUVASTATIN CALCIUM 5 MG T: 5 | 90 days supply | Qty: 90 | Fill #2

## 2019-03-25 ENCOUNTER — Telehealth: Payer: 59 | Admitting: Physician Assistant

## 2019-03-25 DIAGNOSIS — R112 Nausea with vomiting, unspecified: Secondary | ICD-10-CM

## 2019-03-25 NOTE — Progress Notes (Signed)
Based on what you shared with me, I feel your condition warrants further evaluation and I recommend that you be seen for a face to face office visit.  I am concerned there could be a more serious condition that is causing your nausea and headache.  I would feel more comfortable if you were seen face to face by a medical provider for an evaluation.     NOTE: If you entered your credit card information for this eVisit, you will not be charged. You may see a "hold" on your card for the $35 but that hold will drop off and you will not have a charge processed.   If you are having a true medical emergency please call 911.      For an urgent face to face visit, [place] has five urgent care centers for your convenience:      NEW:  [facility] at [facility] Directions [phone] [address] [place], [place] [address] . 10 am - 6pm Monday - Friday    [facility]) Get Driving Directions [phone] [address] [place], [place] [address] . 10 am to 8 pm Monday-Friday . 12 pm to 8 pm [facility] Care at [facility] Get Driving Directions [phone] [address] [place], [place] [address] . 8 am to 8 pm Monday-Friday . 9 am to 6 pm Saturday . 11 am to 6 pm Sunday     [facility] at [facility] Get Driving Directions  [phone] [address] [place], [place] [address] . 8 am to 8 pm Monday-Friday . 8 am to 4 pm [facility] Urgent Care at [facility] Directions [phone] [address] [place], [place] [address] . 12 pm to 6 pm Monday-Friday      Your e-visit answers were reviewed by a board certified advanced clinical practitioner to complete your personal care plan.  Thank you for using e-Visits.

## 2019-03-26 ENCOUNTER — Other Ambulatory Visit: Payer: Self-pay

## 2019-03-26 ENCOUNTER — Ambulatory Visit (INDEPENDENT_AMBULATORY_CARE_PROVIDER_SITE_OTHER)
Admission: RE | Admit: 2019-03-26 | Discharge: 2019-03-26 | Disposition: A | Discharge status: Home / Self Care | Payer: 59 | Source: Ambulatory Visit

## 2019-03-26 DIAGNOSIS — R399 Unspecified symptoms and signs involving the genitourinary system: Secondary | ICD-10-CM | POA: Diagnosis not present

## 2019-03-26 MED ORDER — TAMSULOSIN HCL 0.4 MG PO CAPS: 0.4000 mg | ORAL_CAPSULE | Freq: Every day | ORAL | 0 refills | Status: DC

## 2019-03-26 NOTE — ED Provider Notes (Signed)
Virtual Visit via Video Note:  Nathaniel [name]  initiated request for Telemedicine visit with [facility] Urgent Care team. I connected with Nathaniel [name]  on 03/26/2019 at 6:31 PM  for a synchronized telemedicine visit using a video enabled HIPPA compliant telemedicine application. I verified that I am speaking with Nathaniel [name]  using two identifiers.  Jodell Cipro, PA-C  was physically located in a [facility] Urgent care site and Nathaniel [name] was located at a different location.   The limitations of evaluation and management by telemedicine as well as the availability of in-person appointments were discussed. Patient was informed that he  may incur a bill ( including co-pay) for this virtual visit encounter. Nathaniel [name]  expressed understanding and gave verbal consent to proceed with virtual visit.     History of Present Illness:Nathaniel [name]  is a 54 y.o. male presents with 2 month history of urinary frequency, dysuria, irritation. Denies trouble making a stream. History of prostate problems with dizziness when taking flomax. Nausea yesterday that has resolved. Denies abdominal pain, diarrhea. Denies fever, chills, body aches. Denies painful bowel movement. Denies pain with sitting. Denies testicular swelling, pain. Last a1c 8.4, states medicines were switched and CBG improving. Has not noticed association of symptoms with CBG level.   Past Medical History:  Diagnosis Date  . Depression   . Diabetes mellitus    type II  . GERD (gastroesophageal reflux disease)   . Headache(784.0)   . Hypogonadism male   . Sleep apnea     Allergies  Allergen Reactions  . Cymbalta [Duloxetine Hcl] Other (See Comments)    Suicidal thoughts  . Influenza Vaccines Other (See Comments)    Unknown reaction  . Penicillins Other (See Comments)    Unknown - childhood allergy per mother  . Pioglitazone Other (See Comments)      headache  . Promethazine Hcl Nausea And Vomiting       . Reglan  [Metoclopramide] Other (See Comments)    Jittery, increased anxiety     Observations/Objective: General: Well appearing, nontoxic, no acute distress. Sitting comfortably. Head: Normocephalic, atraumatic Eye: No conjunctival injection, eyelid swelling. EOMI ENT: Mucus membranes moist, no lip cracking. No obvious nasal drainage. Pulm: Speaking in full sentences without difficulty. Normal effort. No respiratory distress, accessory muscle use. Neuro: Normal mental status. Alert and oriented x 3.  Assessment and Plan: Discussed case with Dr Lanny Cramp. Patient states unable to get in person evaluation at this time due to new work schedule. PCP left practice and new PCP starting 04/2019. Given 2 month history without worsening, fever, lower suspicion for infectious cause at this time. However, without prostate exam, unable to adequately switch medications for LUTS. Discussed trying flomax at night time to decrease side effects of medicine. Patient would like to try this. 2 week prescription provided. Patient to follow up with PCP/urology if symptoms not improving. Return precautions given. Patient expresses understanding and agrees to plan.  Follow Up Instructions:    I discussed the assessment and treatment plan with the patient. The patient was provided an opportunity to ask questions and all were answered. The patient agreed with the plan and demonstrated an understanding of the instructions.   The patient was advised to call back or seek an in-person evaluation if the symptoms worsen or if the condition fails to improve as anticipated.  I provided 30 minutes of non-face-to-face time during this encounter.    Ok Edwards, PA-C  03/26/2019 6:31 PM         Belinda Fisher, PA-C 03/26/19 1902

## 2019-03-26 NOTE — Discharge Instructions (Signed)
Start flomax as directed. Switch medicine to night time to see if it helps with side effect. If symptoms not improving, will need in person testing of urine. If unable to tolerate medication, will need to see urology/PCP for further evaluation and management needed. If having fever, abdominal pain, unable to urinate, go to the emergency department for further evaluation needed.

## 2019-03-29 MED FILL — metFORMIN HCL 1000 MG TABS: 1000 | 30 days supply | Qty: 60 | Fill #0

## 2019-04-02 DIAGNOSIS — R8271 Bacteriuria: Secondary | ICD-10-CM | POA: Diagnosis not present

## 2019-04-02 DIAGNOSIS — N451 Epididymitis: Secondary | ICD-10-CM | POA: Diagnosis not present

## 2019-04-02 DIAGNOSIS — N401 Enlarged prostate with lower urinary tract symptoms: Secondary | ICD-10-CM | POA: Diagnosis not present

## 2019-04-02 DIAGNOSIS — R350 Frequency of micturition: Secondary | ICD-10-CM | POA: Diagnosis not present

## 2019-04-02 MED FILL — CLOTRIMAZOLE-BETAMETHASONE: 1-0.05 | 15 days supply | Qty: 30 | Fill #0

## 2019-04-02 MED FILL — MELOXICAM 15 MG TABLET: 15 | 14 days supply | Qty: 14 | Fill #0

## 2019-04-05 DIAGNOSIS — E109 Type 1 diabetes mellitus without complications: Secondary | ICD-10-CM | POA: Diagnosis not present

## 2019-04-05 LAB — LIPID PANEL
Cholesterol: 123 (ref 0–200)
HDL: 59 (ref 35–70)
LDL Cholesterol: 51
Triglycerides: 57 (ref 40–160)

## 2019-04-05 LAB — HEMOGLOBIN A1C: Hemoglobin A1C: 8.2

## 2019-04-06 LAB — HEPATIC FUNCTION PANEL
ALT: 50 — AB (ref 10–40)
AST: 28 (ref 14–40)
Alkaline Phosphatase: 69 (ref 25–125)
Bilirubin, Total: 0.6

## 2019-04-06 LAB — BASIC METABOLIC PANEL
BUN: 9 (ref 4–21)
CO2: 98 — AB (ref 13–22)
Chloride: 98 — AB (ref 99–108)
Creatinine: 1 (ref <=1.3)
Glucose: 193
Potassium: 4.3 (ref 3.4–5.3)
Sodium: 136 — AB (ref 137–147)

## 2019-04-06 LAB — COMPREHENSIVE METABOLIC PANEL
Albumin: 4.1 (ref 3.5–5.0)
Calcium: 9.1 (ref 8.7–10.7)
Globulin: 2.5

## 2019-04-08 MED FILL — FIASP 100 UNIT/ML SOLN: 100 | 28 days supply | Qty: 20 | Fill #1

## 2019-04-09 DIAGNOSIS — Z6829 Body mass index (BMI) 29.0-29.9, adult: Secondary | ICD-10-CM | POA: Diagnosis not present

## 2019-04-09 DIAGNOSIS — Z7189 Other specified counseling: Secondary | ICD-10-CM | POA: Diagnosis not present

## 2019-04-09 DIAGNOSIS — E785 Hyperlipidemia, unspecified: Secondary | ICD-10-CM | POA: Diagnosis not present

## 2019-04-09 DIAGNOSIS — Z9641 Presence of insulin pump (external) (internal): Secondary | ICD-10-CM | POA: Diagnosis not present

## 2019-04-09 DIAGNOSIS — E109 Type 1 diabetes mellitus without complications: Secondary | ICD-10-CM | POA: Diagnosis not present

## 2019-04-09 DIAGNOSIS — Z794 Long term (current) use of insulin: Secondary | ICD-10-CM | POA: Diagnosis not present

## 2019-04-09 MED FILL — FLUoxetine HCL 20 MG CAPS: 20 | 30 days supply | Qty: 30 | Fill #2

## 2019-04-17 DIAGNOSIS — E1065 Type 1 diabetes mellitus with hyperglycemia: Secondary | ICD-10-CM | POA: Diagnosis not present

## 2019-04-18 MED FILL — MELOXICAM 15 MG TABLET: 15 | 14 days supply | Qty: 14 | Fill #1

## 2019-04-30 ENCOUNTER — Encounter: Payer: Self-pay | Admitting: Family Medicine

## 2019-04-30 ENCOUNTER — Ambulatory Visit (INDEPENDENT_AMBULATORY_CARE_PROVIDER_SITE_OTHER): Payer: 59 | Admitting: Family Medicine

## 2019-04-30 VITALS — BP 123/77 | HR 78 | Wt 218.0 lb

## 2019-04-30 DIAGNOSIS — F329 Major depressive disorder, single episode, unspecified: Secondary | ICD-10-CM

## 2019-04-30 DIAGNOSIS — Z0000 Encounter for general adult medical examination without abnormal findings: Secondary | ICD-10-CM

## 2019-04-30 DIAGNOSIS — E1065 Type 1 diabetes mellitus with hyperglycemia: Secondary | ICD-10-CM

## 2019-04-30 DIAGNOSIS — N401 Enlarged prostate with lower urinary tract symptoms: Secondary | ICD-10-CM

## 2019-04-30 DIAGNOSIS — R350 Frequency of micturition: Secondary | ICD-10-CM | POA: Diagnosis not present

## 2019-04-30 DIAGNOSIS — F419 Anxiety disorder, unspecified: Secondary | ICD-10-CM

## 2019-04-30 DIAGNOSIS — F32A Depression, unspecified: Secondary | ICD-10-CM

## 2019-04-30 NOTE — Assessment & Plan Note (Signed)
Well adult No orders of the defined types were placed in this encounter. Screening: UTD Immunization: UTD Anticipatory guidance/Risk factor reduction:  Recommend increasing daily activity and incorporating exercise at least 3x/week.  Continue carb counting, increased intake of fruits and vegetables. Additional recommendations per AVS.

## 2019-04-30 NOTE — Progress Notes (Signed)
Nathaniel [name] - 54 y.o. male MRN [id number]  Date of birth: 11-06-1965  Subjective Chief Complaint  Patient presents with  . Annual Exam    HPI Nathaniel [name] is a 54 y.o. male with history of T1DM, BPH, OSA, HLD and migraines here today for annual exam.  He is followed by endocrinology (Dr. Garnet Koyanagi at [facility]) for management of his diabetes.  His most recent a1c was 8.2%.  He reports that he is counting carbs and trying to follow a healthy diet. He uses a medtronic pump with CGM.  His activity level has been low.   He was seen 03/2019 at urgent care for complaint of LUT's and subsequently referred to urology.  He had normal DRE and PSA.    He has no new concerns today.   He is a non-smoker and denies EtOH use.    He is UTD on vaccines  He had eye exam in 12/2018, reports that this was normal.   Depression screen [facility] 2/9 04/30/2019 04/23/2018 12/29/2017  Decreased Interest 1 1 0  Down, Depressed, Hopeless 1 1 1   PHQ - 2 Score 2 2 1   Altered sleeping 1 0 -  Tired, decreased energy 2 1 -  Change in appetite 0 0 -  Feeling bad or failure about yourself  0 0 -  Trouble concentrating 0 0 -  Moving slowly or fidgety/restless 0 0 -  Suicidal thoughts 0 0 -  PHQ-9 Score 5 3 -  Difficult doing work/chores - Not difficult at all -  Some recent data might be hidden   GAD 7 : Generalized Anxiety Score 04/30/2019 04/23/2018 08/15/2017 07/03/2017  Nervous, Anxious, on Edge 1 1 1 2   Control/stop worrying 0 0 1 1  Worry too much - different things 1 0 1 2  Trouble relaxing 0 0 0 1  Restless 0 0 0 0  Easily annoyed or irritable 1 0 0 2  Afraid - awful might happen 0 0 1 2  Total GAD 7 Score 3 1 4 10   Anxiety Difficulty - Not difficult at all - Somewhat difficult      Review of Systems  Constitutional: Negative for chills, fever, malaise/fatigue and weight loss.  HENT: Negative for congestion, ear pain and sore throat.   Eyes: Negative for blurred vision, double vision and pain.  Respiratory:  Negative for cough and shortness of breath.   Cardiovascular: Negative for chest pain and palpitations.  Gastrointestinal: Negative for abdominal pain, blood in stool, constipation, heartburn and nausea.  Genitourinary: Negative for dysuria and urgency.  Musculoskeletal: Negative for joint pain and myalgias.  Neurological: Negative for dizziness and headaches.  Endo/Heme/Allergies: Does not bruise/bleed easily.  Psychiatric/Behavioral: Negative for depression. The patient is not nervous/anxious and does not have insomnia.       Allergies  Allergen Reactions  . Cymbalta [Duloxetine Hcl] Other (See Comments)    Suicidal thoughts  . Influenza Vaccines Other (See Comments)    Unknown reaction  . Penicillins Other (See Comments)    Unknown - childhood allergy per mother  . Pioglitazone Other (See Comments)      headache  . Promethazine Hcl Nausea And Vomiting       . Reglan [Metoclopramide] Other (See Comments)    Jittery, increased anxiety    Past Medical History:  Diagnosis Date  . Depression   . Diabetes mellitus    type II  . GERD (gastroesophageal reflux disease)   . Headache(784.0)   . Hypogonadism male   .  Sleep apnea     Past Surgical History:  Procedure Laterality Date  . NASAL SINUS SURGERY  04/1991  . TONSILLECTOMY  1972    Social History   Socioeconomic History  . Marital status: Married    Spouse name: Asencion Islam  . Number of children: 2  . Years of education: Not on file  . Highest education level: Not on file  Occupational History    Employer: [employer]    Comment: Mental Health Case Management  Tobacco Use  . Smoking status: Never Smoker  . Smokeless tobacco: Never Used  Substance and Sexual Activity  . Alcohol use: No  . Drug use: No  . Sexual activity: Not on file  Other Topics Concern  . Not on file  Social History Narrative   He lives with wife and two children.   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   Highest level of education: Child psychotherapist degree   Social Determinants of Health   Financial Resource Strain:   . Difficulty of Paying Living Expenses: Not on file  Food Insecurity:   . Worried About Programme researcher, broadcasting/film/video in the Last Year: Not on file  . Ran Out of Food in the Last Year: Not on file  Transportation Needs:   . Lack of Transportation (Medical): Not on file  . Lack of Transportation (Non-Medical): Not on file  Physical Activity:   . Days of Exercise per Week: Not on file  . Minutes of Exercise per Session: Not on file  Stress:   . Feeling of Stress : Not on file  Social Connections:   . Frequency of Communication with Friends and Family: Not on file  . Frequency of Social Gatherings with Friends and Family: Not on file  . Attends Religious Services: Not on file  . Active Member of Clubs or Organizations: Not on file  . Attends Banker Meetings: Not on file  . Marital Status: Not on file    Family History  Adopted: Yes  Problem Relation Age of Onset  . Cancer Father 9       Prostate cancer  . Healthy Mother   . Diabetes Mellitus I Son     Health Maintenance  Topic Date Due  . HIV Screening  09/02/1980  . FOOT EXAM  06/02/2018  . OPHTHALMOLOGY EXAM  07/19/2018  . HEMOGLOBIN A1C  03/29/2019  . Fecal DNA (Cologuard)  02/29/2020  . TETANUS/TDAP  03/01/2021  . INFLUENZA VACCINE  Completed  . PNEUMOCOCCAL POLYSACCHARIDE VACCINE AGE 55-64 HIGH RISK  Completed    ----------------------------------------------------------------------------------------------------------------------------------------------------------------------------------------------------------------- Physical Exam BP 123/77   Pulse 78   Wt 218 lb (98.9 kg)   SpO2 98%   BMI 29.57 kg/m   Physical Exam Constitutional:      General: He is not in acute distress. HENT:     Head: Normocephalic and atraumatic.     Right Ear: External ear normal.     Left Ear: External ear  normal.     Mouth/Throat:     Mouth: Mucous membranes are moist.  Eyes:     General: No scleral icterus. Neck:     Thyroid: No thyromegaly.  Cardiovascular:     Rate and Rhythm: Normal rate and regular rhythm.     Heart sounds: Normal heart sounds.  Pulmonary:     Effort: Pulmonary effort is normal.     Breath sounds: Normal breath sounds.  Abdominal:     General: Bowel sounds  are normal. There is no distension.     Palpations: Abdomen is soft.     Tenderness: There is no abdominal tenderness. There is no guarding.  Musculoskeletal:     Cervical back: Normal range of motion and neck supple.  Lymphadenopathy:     Cervical: No cervical adenopathy.  Skin:    General: Skin is warm and dry.     Findings: No rash.  Neurological:     General: No focal deficit present.     Mental Status: He is alert and oriented to person, place, and time.     Cranial Nerves: No cranial nerve deficit.     Motor: No abnormal muscle tone.  Psychiatric:        Mood and Affect: Mood normal.        Behavior: Behavior normal.    Diabetic Foot Exam - Simple   Simple Foot Form Diabetic Foot exam was performed with the following findings: Yes 04/30/2019  9:00 AM  Visual Inspection No deformities, no ulcerations, no other skin breakdown bilaterally: Yes Sensation Testing Intact to touch and monofilament testing bilaterally: Yes Pulse Check Posterior Tibialis and Dorsalis pulse intact bilaterally: Yes Comments      ------------------------------------------------------------------------------------------------------------------------------------------------------------------------------------------------------------------- Assessment and Plan  BPH (benign prostatic hyperplasia) Seen at [facility] for [facility]'s in 03/2019.  F/u with urology, stable at this time.   Type 1 diabetes mellitus, uncontrolled (HCC) Managed by Endo, stable at this time.   Well adult exam Well adult No orders of the defined types were  placed in this encounter. Screening: UTD Immunization: UTD Anticipatory guidance/Risk factor reduction:  Recommend increasing daily activity and incorporating exercise at least 3x/week.  Continue carb counting, increased intake of fruits and vegetables. Additional recommendations per AVS.    This visit occurred during the SARS-CoV-2 public health emergency.  Safety protocols were in place, including screening questions prior to the visit, additional usage of staff PPE, and extensive cleaning of exam room while observing appropriate contact time as indicated for disinfecting solutions.

## 2019-04-30 NOTE — Assessment & Plan Note (Signed)
Seen at [facility] for LUT's in 03/2019.  F/u with urology, stable at this time.

## 2019-04-30 NOTE — Assessment & Plan Note (Signed)
Managed by Endo, stable at this time.

## 2019-04-30 NOTE — Patient Instructions (Signed)
Preventive Care 64-54 Years Old, Male Preventive care refers to lifestyle choices and visits with your health care provider that can promote health and wellness. This includes:  A yearly physical exam. This is also called an annual well check.  Regular dental and eye exams.  Immunizations.  Screening for certain conditions.  Healthy lifestyle choices, such as eating a healthy diet, getting regular exercise, not using drugs or products that contain nicotine and tobacco, and limiting alcohol use. What can I expect for my preventive care visit? Physical exam Your health care provider will check:  Height and weight. These may be used to calculate body mass index (BMI), which is a measurement that tells if you are at a healthy weight.  Heart rate and blood pressure.  Your skin for abnormal spots. Counseling Your health care provider may ask you questions about:  Alcohol, tobacco, and drug use.  Emotional well-being.  Home and relationship well-being.  Sexual activity.  Eating habits.  Work and work Statistician. What immunizations do I need?  Influenza (flu) vaccine  This is recommended every year. Tetanus, diphtheria, and pertussis (Tdap) vaccine  You may need a Td booster every 10 years. Varicella (chickenpox) vaccine  You may need this vaccine if you have not already been vaccinated. Zoster (shingles) vaccine  You may need this after age 79. Measles, mumps, and rubella (MMR) vaccine  You may need at least one dose of MMR if you were born in 1957 or later. You may also need a second dose. Pneumococcal conjugate (PCV13) vaccine  You may need this if you have certain conditions and were not previously vaccinated. Pneumococcal polysaccharide (PPSV23) vaccine  You may need one or two doses if you smoke cigarettes or if you have certain conditions. Meningococcal conjugate (MenACWY) vaccine  You may need this if you have certain conditions. Hepatitis A  vaccine  You may need this if you have certain conditions or if you travel or work in places where you may be exposed to hepatitis A. Hepatitis B vaccine  You may need this if you have certain conditions or if you travel or work in places where you may be exposed to hepatitis B. Haemophilus influenzae type b (Hib) vaccine  You may need this if you have certain risk factors. Human papillomavirus (HPV) vaccine  If recommended by your health care provider, you may need three doses over 6 months. You may receive vaccines as individual doses or as more than one vaccine together in one shot (combination vaccines). Talk with your health care provider about the risks and benefits of combination vaccines. What tests do I need? Blood tests  Lipid and cholesterol levels. These may be checked every 5 years, or more frequently if you are over 72 years old.  Hepatitis C test.  Hepatitis B test. Screening  Lung cancer screening. You may have this screening every year starting at age 54 if you have a 30-pack-year history of smoking and currently smoke or have quit within the past 15 years.  Prostate cancer screening. Recommendations will vary depending on your family history and other risks.  Colorectal cancer screening. All adults should have this screening starting at age 35 and continuing until age 71. Your health care provider may recommend screening at age 45 if you are at increased risk. You will have tests every 1-10 years, depending on your results and the type of screening test.  Diabetes screening. This is done by checking your blood sugar (glucose) after you have not eaten  for a while (fasting). You may have this done every 1-3 years.  Sexually transmitted disease (STD) testing. Follow these instructions at home: Eating and drinking  Eat a diet that includes fresh fruits and vegetables, whole grains, lean protein, and low-fat dairy products.  Take vitamin and mineral supplements as  recommended by your health care provider.  Do not drink alcohol if your health care provider tells you not to drink.  If you drink alcohol: ? Limit how much you have to 0-2 drinks a day. ? Be aware of how much alcohol is in your drink. In the [place] one drink equals one 12 oz bottle of beer (355 mL), one 5 oz glass of wine (148 mL), or one 1 oz glass of hard liquor (44 mL). Lifestyle  Take daily care of your teeth and gums.  Stay active. Exercise for at least 30 minutes on 5 or more days each week.  Do not use any products that contain nicotine or tobacco, such as cigarettes, e-cigarettes, and chewing tobacco. If you need help quitting, ask your health care provider.  If you are sexually active, practice safe sex. Use a condom or other form of protection to prevent STIs (sexually transmitted infections).  Talk with your health care provider about taking a low-dose aspirin every day starting at age 91. What's next?  Go to your health care provider once a year for a well check visit.  Ask your health care provider how often you should have your eyes and teeth checked.  Stay up to date on all vaccines. This information is not intended to replace advice given to you by your health care provider. Make sure you discuss any questions you have with your health care provider. Document Revised: 03/01/2018 Document Reviewed: 03/01/2018 Elsevier Patient Education  2020 [employer].

## 2019-05-01 ENCOUNTER — Encounter: Payer: Self-pay | Admitting: Family Medicine

## 2019-05-03 ENCOUNTER — Other Ambulatory Visit: Payer: Self-pay | Admitting: Family Medicine

## 2019-05-03 MED FILL — LISINOPRIL 2.5 MG TABLET: 2.5 | 90 days supply | Qty: 90 | Fill #0

## 2019-05-07 MED FILL — FIASP 100 UNIT/ML SOLN: 100 | 28 days supply | Qty: 20 | Fill #2

## 2019-05-11 DIAGNOSIS — E1065 Type 1 diabetes mellitus with hyperglycemia: Secondary | ICD-10-CM | POA: Diagnosis not present

## 2019-05-13 MED FILL — metFORMIN HCL 1000 MG TABS: 1000 | 90 days supply | Qty: 180 | Fill #0

## 2019-05-13 MED FILL — FLUoxetine HCL 20 MG CAPS: 20 | 30 days supply | Qty: 30 | Fill #3

## 2019-05-15 ENCOUNTER — Emergency Department
Admission: EM | Admit: 2019-05-15 | Discharge: 2019-05-15 | Disposition: A | Discharge status: Home / Self Care | Payer: 59 | Source: Home / Self Care

## 2019-05-15 ENCOUNTER — Other Ambulatory Visit: Payer: Self-pay

## 2019-05-15 ENCOUNTER — Encounter: Payer: Self-pay | Admitting: Family Medicine

## 2019-05-15 ENCOUNTER — Encounter: Payer: Self-pay | Admitting: *Deleted

## 2019-05-15 DIAGNOSIS — R42 Dizziness and giddiness: Secondary | ICD-10-CM | POA: Diagnosis not present

## 2019-05-15 MED ORDER — MECLIZINE HCL 25 MG PO TABS: 25.0000 mg | ORAL_TABLET | Freq: Three times a day (TID) | ORAL | 0 refills | Status: DC | PRN

## 2019-05-15 MED FILL — MECLIZINE 25 MG TABLET: 25 | 10 days supply | Qty: 30 | Fill #0

## 2019-05-15 NOTE — Discharge Instructions (Signed)
Take medications as prescribed  Drink plenty of fluids  Follow-up with your primary care doctor if your symptoms doesn't improve  Go to the Emergency Department immediately if you begin to experience any of the symptoms we discussed today

## 2019-05-15 NOTE — ED Provider Notes (Signed)
[name]    CSN: [id number] Arrival date & time: 05/15/19  1045      History   Chief Complaint Chief Complaint  Patient presents with   Dizziness    HPI Nathaniel [name] is a 54 y.o. male.   Subjective:  Nathaniel [name] is a 54 y.o. male who is here for evaluation of dizziness. The dizziness has been present for a few hours. Nathaniel describes the symptoms as disequalibirum. Symptoms are exacerbated by rapid head movements. Nathaniel also complains of nausea and unsteadiness with ambulation. Nathaniel denies any otalgia, tinnitus, blurred vision, chest pain, shortness of breath, vomiting, headache or limb/facial parastheisas. No prior history of vertigo. Nathaniel has IDDM and treated with a pump. Patient noted to be checking his blood glucose upon entering the examination room. Results were 91. Non fasting as patient has eaten breakfast this morning.   The following portions of the patient's history were reviewed and updated as appropriate: allergies, current medications, past family history, past medical history, past social history, past surgical history and problem list.       Past Medical History:  Diagnosis Date   Depression    Diabetes mellitus    type II   GERD (gastroesophageal reflux disease)    Headache(784.0)    Hypogonadism male    Sleep apnea     Patient Active Problem List   Diagnosis Date Noted   Well adult exam 04/30/2019   Scrotal rash 01/25/2019   Pain in right knee 08/07/2018   Anxiety and depression 07/03/2017   Obstructive sleep apnea 06/09/2016   Type 1 diabetes mellitus, uncontrolled ([facility]) 10/02/2015   Cervical radiculitis 01/25/2015   BPH (benign prostatic hyperplasia) 09/12/2014   Neck pain 02/27/2013   Chronic headaches 09/28/2011   Seasonal and perennial allergic rhinitis 09/28/2011   FATIGUE, CHRONIC 12/16/2008    Past Surgical History:  Procedure Laterality Date   NASAL SINUS SURGERY  04/1991   TONSILLECTOMY  1972        Home Medications    Prior to Admission medications   Medication Sig Start Date End Date Taking? Authorizing Provider  Alcohol Swabs (ALCOHOL PREPS) PADS Use alcohol pads to clean site to be used to check blood sugar. 05/02/14   Philemon Kingdom, MD  AMBULATORY NON FORMULARY MEDICATION Change CPAP setting to 13.5 (14 is ok if 13.5 is not available). Please provide new machine as old machine [name] not fixable and Mr Murin has improved AHI with new machine. 07/19/18   Gregor Hams, MD  aspirin EC 81 MG tablet Take 2 tablets (162 mg total) by mouth daily. 07/13/17   Revankar, Reita Cliche, MD  Blood Glucose Monitoring Suppl (CONTOUR NEXT MONITOR) w/Device KIT 1 Device by Does not apply route daily. Use to test blood sugar daily 04/03/18   Philemon Kingdom, MD  clotrimazole-betamethasone (LOTRISONE) cream Apply 1 application topically 2 (two) times daily. 01/25/19   Silverio Decamp, MD  FLUoxetine (PROZAC) 20 MG capsule Take 1 capsule (20 mg total) by mouth daily. 01/29/19   Silverio Decamp, MD  GLUCAGON EMERGENCY 1 MG injection INJECT 1 MG INTO THE MUSCLE ONCE AS NEEDED. 06/23/17   Philemon Kingdom, MD  glucose blood (CONTOUR NEXT TEST) test strip Use as instructed to check blood sugar 5 times daily. 09/12/17   Philemon Kingdom, MD  insulin lispro (HUMALOG) 100 UNIT/ML injection INJECT up to 70 units INTO THE SKIN DAILY-in the insulin pump 08/10/18   Philemon Kingdom, MD  Lancets (FREESTYLE)  lancets Use as instructed to check sugar 4 times daily. 04/27/16   Philemon Kingdom, MD  lisinopril (ZESTRIL) 2.5 MG tablet TAKE 1 TABLET (2.5 MG TOTAL) BY MOUTH DAILY. 05/03/19   Luetta Nutting, DO  meclizine (ANTIVERT) 25 MG tablet Take 1 tablet (25 mg total) by mouth 3 (three) times daily as needed for dizziness. 05/15/19   Enrique Sack, FNP  meloxicam (MOBIC) 7.5 MG tablet Take 1 tablet (7.5 mg total) by mouth daily. 11/13/18   Marybelle Killings, MD  metFORMIN (GLUCOPHAGE-XR) 500 MG 24 hr tablet  TAKE 4 TABLETS BY MOUTH DAILY in the morning 01/04/18   Philemon Kingdom, MD  ondansetron (ZOFRAN ODT) 4 MG disintegrating tablet Take one tab by mouth Q6hr prn nausea.  Dissolve under tongue. 03/06/17   Kandra Nicolas, MD  ALPRAZolam Duanne Moron) 0.5 MG tablet Take 1 tablet (0.5 mg total) by mouth daily as needed for anxiety. 02/11/19 03/26/19  Silverio Decamp, MD    Family History Family History  Adopted: Yes  Problem Relation Age of Onset   Cancer Father 52       Prostate cancer   Healthy Mother    Diabetes Mellitus I Son     Social History Social History   Tobacco Use   Smoking status: Never Smoker   Smokeless tobacco: Never Used  Substance Use Topics   Alcohol use: No   Drug use: No     Allergies   Cymbalta [duloxetine hcl], Influenza vaccines, Penicillins, Pioglitazone, Promethazine hcl, and Reglan [metoclopramide]   Review of Systems Review of Systems  Constitutional: Negative for fever.  HENT: Negative for ear pain and tinnitus.   Eyes: Negative for visual disturbance.  Respiratory: Negative for cough and shortness of breath.   Cardiovascular: Negative for chest pain and palpitations.  Gastrointestinal: Positive for nausea. Negative for vomiting.  Genitourinary: Negative.   Musculoskeletal: Negative.   Skin: Negative.   Neurological: Positive for dizziness. Negative for tremors, syncope, speech difficulty, numbness and headaches.  All other systems reviewed and are negative.    Physical Exam Triage Vital Signs ED Triage Vitals  Enc Vitals Group     BP --      Pulse --      Resp --      Temp 05/15/19 1056 97.9 F (36.6 C)     Temp Source 05/15/19 1056 Oral     SpO2 05/15/19 1056 97 %     Weight 05/15/19 1054 217 lb (98.4 kg)     Height 05/15/19 1054 6' (1.829 m)     Head Circumference --      Peak Flow --      Pain Score 05/15/19 1054 0     Pain Loc --      Pain Edu? --      Excl. in [place]? --    Orthostatic VS for the past 24 hrs:   BP- Lying Pulse- Lying BP- Sitting Pulse- Sitting BP- Standing at 0 minutes Pulse- Standing at 0 minutes  05/15/19 1058 115/76 68 122/79 74 114/70 78    Updated Vital Signs Temp 97.9 F (36.6 C) (Oral)    Ht 6' (1.829 m)    Wt 217 lb (98.4 kg)    SpO2 97%    BMI 29.43 kg/m   Visual Acuity Right Eye Distance:   Left Eye Distance:   Bilateral Distance:    Right Eye Near:   Left Eye Near:    Bilateral Near:     Physical Exam Vitals  and nursing note reviewed.  Constitutional:      General: Nathaniel is not in acute distress.    Appearance: Normal appearance. Nathaniel is not ill-appearing or toxic-appearing.  HENT:     Head: Normocephalic.  Eyes:     General: Vision grossly intact.     Extraocular Movements:     Right eye: Nystagmus present.     Left eye: Nystagmus present.  Cardiovascular:     Rate and Rhythm: Normal rate and regular rhythm.  Pulmonary:     Effort: Pulmonary effort is normal.     Breath sounds: Normal breath sounds.  Musculoskeletal:        General: Normal range of motion.     Cervical back: Full passive range of motion without pain, normal range of motion and neck supple.  Skin:    General: Skin is warm and dry.  Neurological:     General: No focal deficit present.     Mental Status: Nathaniel is alert.     Cranial Nerves: Cranial nerves are intact.     Sensory: Sensation is intact.     Motor: Motor function is intact.     Coordination: Coordination is intact.     Gait: Gait is intact.     Comments: + dix-hallpike      UC Treatments / Results  Labs (all labs ordered are listed, but only abnormal results are displayed) Labs Reviewed - No data to display  EKG   Radiology No results found.  Procedures Procedures (including critical care time)  Medications Ordered in UC Medications - No data to display  Initial Impression / Assessment and Plan / UC Course  I have reviewed the triage vital signs and the nursing notes.  Pertinent labs & imaging results  that were available during my care of the patient were reviewed by me and considered in my medical decision making (see chart for details).    54 yo male with history of IDDM presenting with acute dizziness. Patient AAOx3. Afebrile. Orthostatics unremarkable. Physical exam consistent with acute vertigo. The nature of vertigo syndromes were discussed along with their usual course and treatment. Educational materials given and questions answered. Meclizine per medication orders.  Today's evaluation has revealed no signs of a dangerous process. Discussed diagnosis with patient and/or guardian. Patient and/or guardian aware of their diagnosis, possible red flag symptoms to watch out for and need for close follow up. Patient and/or guardian understands verbal and written discharge instructions. Patient and/or guardian comfortable with plan and disposition.  Patient and/or guardian has a clear mental status at this time, good insight into illness (after discussion and teaching) and has clear judgment to make decisions regarding their care  This care [name] provided during an unprecedented National Emergency due to the Novel Coronavirus (COVID-19) pandemic. COVID-19 infections and transmission risks place heavy strains on healthcare resources.  As this pandemic evolves, our facility, providers, and staff strive to respond fluidly, to remain operational, and to provide care relative to available resources and information. Outcomes are unpredictable and treatments are without well-defined guidelines. Further, the impact of COVID-19 on all aspects of urgent care, including the impact to patients seeking care for reasons other than COVID-19, is unavoidable during this national emergency. At this time of the global pandemic, management of patients has significantly changed, even for non-COVID positive patients given high local and regional COVID volumes at this time requiring high healthcare system and resource utilization.  The standard of care for management of both COVID suspected  and non-COVID suspected patients continues to change rapidly at the local, regional, national, and global levels. This patient [name] worked up and treated to the best available but ever changing evidence and resources available at this current time.   Documentation [name] completed with the aid of voice recognition software. Transcription may contain typographical errors.   Final Clinical Impressions(s) / UC Diagnoses   Final diagnoses:  Vertigo     Discharge Instructions      Take medications as prescribed   Drink plenty of fluids   Follow-up with your primary care doctor if your symptoms doesn't improve   Go to the Emergency Department immediately if you begin to experience any of the symptoms we discussed today    ED Prescriptions    Medication Sig Dispense Auth. Provider   meclizine (ANTIVERT) 25 MG tablet Take 1 tablet (25 mg total) by mouth 3 (three) times daily as needed for dizziness. 30 tablet Enrique Sack, FNP     PDMP not reviewed this encounter.   Enrique Sack, [place] 05/15/19 1131

## 2019-05-15 NOTE — ED Triage Notes (Signed)
Pt c/o dizziness and mild nausea x this morning.

## 2019-05-17 ENCOUNTER — Encounter: Payer: Self-pay | Admitting: Family Medicine

## 2019-06-05 DIAGNOSIS — G4733 Obstructive sleep apnea (adult) (pediatric): Secondary | ICD-10-CM | POA: Diagnosis not present

## 2019-06-05 MED FILL — FIASP 100 UNIT/ML SOLN: 100 | 28 days supply | Qty: 20 | Fill #3

## 2019-06-10 ENCOUNTER — Encounter: Payer: Self-pay | Admitting: Family Medicine

## 2019-06-11 ENCOUNTER — Other Ambulatory Visit: Payer: Self-pay | Admitting: Family Medicine

## 2019-06-11 DIAGNOSIS — Z5181 Encounter for therapeutic drug level monitoring: Secondary | ICD-10-CM

## 2019-06-11 DIAGNOSIS — R5383 Other fatigue: Secondary | ICD-10-CM

## 2019-06-12 DIAGNOSIS — E1065 Type 1 diabetes mellitus with hyperglycemia: Secondary | ICD-10-CM | POA: Diagnosis not present

## 2019-06-13 DIAGNOSIS — R5383 Other fatigue: Secondary | ICD-10-CM | POA: Diagnosis not present

## 2019-06-13 DIAGNOSIS — Z5181 Encounter for therapeutic drug level monitoring: Secondary | ICD-10-CM | POA: Diagnosis not present

## 2019-06-14 ENCOUNTER — Encounter: Payer: Self-pay | Admitting: Family Medicine

## 2019-06-14 LAB — CBC
HCT: 38 % — ABNORMAL LOW (ref 38.5–50.0)
Hemoglobin: 12.5 g/dL — ABNORMAL LOW (ref 13.2–17.1)
MCH: 28.8 pg (ref 27.0–33.0)
MCHC: 32.9 g/dL (ref 32.0–36.0)
MCV: 87.6 fL (ref 80.0–100.0)
MPV: 10.8 fL (ref 7.5–12.5)
Platelets: 221 10*3/uL (ref 140–400)
RBC: 4.34 10*6/uL (ref 4.20–5.80)
RDW: 11.7 % (ref 11.0–15.0)
WBC: 5.5 10*3/uL (ref 3.8–10.8)

## 2019-06-14 LAB — TSH: TSH: 1.95 mIU/L (ref 0.40–4.50)

## 2019-06-14 LAB — VITAMIN B12: Vitamin B-12: 407 pg/mL (ref 200–1100)

## 2019-06-14 LAB — TESTOSTERONE: Testosterone: 593 ng/dL (ref 250–827)

## 2019-06-18 ENCOUNTER — Other Ambulatory Visit: Payer: Self-pay | Admitting: Sports Medicine

## 2019-06-18 DIAGNOSIS — F32A Depression, unspecified: Secondary | ICD-10-CM

## 2019-06-18 DIAGNOSIS — F419 Anxiety disorder, unspecified: Secondary | ICD-10-CM

## 2019-06-18 DIAGNOSIS — F329 Major depressive disorder, single episode, unspecified: Secondary | ICD-10-CM

## 2019-06-18 MED FILL — CONTOUR NEXT STRIPS: 90 days supply | Qty: 450 | Fill #1

## 2019-06-18 NOTE — Telephone Encounter (Signed)
To PCP

## 2019-06-19 ENCOUNTER — Encounter: Payer: Self-pay | Admitting: Family Medicine

## 2019-06-19 MED FILL — FLUoxetine HCL 20 MG CAPS: 20 | 30 days supply | Qty: 30 | Fill #0

## 2019-06-19 NOTE — Telephone Encounter (Signed)
I think he needs to come in for a visit to evaluate this.

## 2019-06-20 ENCOUNTER — Telehealth: Payer: 59 | Admitting: Family Medicine

## 2019-06-20 ENCOUNTER — Encounter: Payer: Self-pay | Admitting: Nurse Practitioner

## 2019-06-20 ENCOUNTER — Telehealth (INDEPENDENT_AMBULATORY_CARE_PROVIDER_SITE_OTHER): Payer: 59 | Admitting: Nurse Practitioner

## 2019-06-20 DIAGNOSIS — F419 Anxiety disorder, unspecified: Secondary | ICD-10-CM | POA: Diagnosis not present

## 2019-06-20 DIAGNOSIS — F329 Major depressive disorder, single episode, unspecified: Secondary | ICD-10-CM | POA: Diagnosis not present

## 2019-06-20 DIAGNOSIS — R5382 Chronic fatigue, unspecified: Secondary | ICD-10-CM | POA: Diagnosis not present

## 2019-06-20 DIAGNOSIS — F32A Depression, unspecified: Secondary | ICD-10-CM

## 2019-06-20 MED ORDER — FLUOXETINE HCL 20 MG PO CAPS: 40.0000 mg | ORAL_CAPSULE | Freq: Every day | ORAL | 3 refills | Status: DC

## 2019-06-20 NOTE — Progress Notes (Signed)
Virtual Visit via MyChart Note  I connected with  Nathaniel [name] on 06/20/19 at  3:30 PM EDT by the video enabled telemedicine application, MyChart, and verified that I am speaking with the correct person using two identifiers.   I introduced myself as a Designer, jewellery with the practice. We discussed the limitations of evaluation and management by telemedicine and the availability of in person appointments. The patient expressed understanding and agreed to proceed.  The patient is: at home I am: in the office  Subjective:    CC: fatigue  HPI: Nathaniel [name] is a 54 y.o. y/o male presenting via [facility] today for ongoing issues with fatigue and shortness of breath with exertion. His fatigue has been ongoing for quite some time, but he always thought it was related to his diabetes or sleep apnea. In the past several months he has been able to get his OSA and DM1 under control, but he feels his fatigue is worsening. He is using an insulin pump to control his diabetes and a CPAP every night for his OSA. His daily reports for his OSA are excellent, but he he never wakes feeling refreshed in the morning.  In the past few weeks he has found himself without stamina and short of breath when walking one flight of stairs to his home office, which is highly unusual for him. He has also been napping during the day, which is also unusual.     He has frequent wakening during the night and often finds it difficult to fall back asleep due to things on his mind. 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 He has weird dreams at night and increased wakening for urination. He recently had his prostate evaluated, but this was fine.   He has had a change in the pace of his job in the last few months from very high stress with many clients to a few clients and much lower stress. 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 He is also now  working from home. He now works one full time job and one part time job, but feels that the stress and pressures of work are still much less than they have been in the past.   He had lab work done last week and it did show slight iron deficiency anemia, for which he has started taking supplemental iron.   He started taking Prozac in November for his mood, but he has not noticed a difference to this point. He has also started counseling. His counselor has suggested to him that his fatigue could be stemming from years of fast paced and his stress work that have caught up with him. He has also started journaling to help with his mood.   He has been on mood medications in the past including Wellbutrin, Zoloft, Celexa, Lexapro, and Cymbalta. Cymbalta caused him to have SI. He was on Lexapro for years. He is unable to say if one medication was more effective than another.   Past medical history, Surgical history, Family history not pertinant except as noted below, Social history, Allergies, and medications have been entered into the medical record, reviewed, and corrections made.   Review of Systems:  He denies fever, chills, sore throat, cough, chest tightness, dizziness, congestion, cold symptoms, confusion, pedal/lower extremity edema, shortness of breath at rest, nausea, vomiting, blood in stools, blood in urine, muscle aches/pain, joint aches/pain, increased  sputum production.   Objective:    General: Speaking clearly in complete sentences without any shortness of breath.  Alert and oriented x3.  Normal judgment. No apparent acute distress. He does present with a flat affect.   Impression and Recommendations:    1. Chronic fatigue Symptoms and presentation consistent with ongoing chronic fatigue of unknown etiology.  Recent laboratory results do show slight anemia for which the patient is currently taking iron supplementation.  All other labs were within normal limits and cannot explain the  patient's current symptoms. At this time is difficult to determine if the patient's sleeping habits could be causing an increase in his symptoms.  His CPAP reports are normal however the patient clearly is not getting a full night sleep and is not waking refreshed.   The patient does express anxiety symptoms that cause an interference with his sleep this coupled by the recent change in his job and pace he is used to working at does correlate with a mood imbalance that could be contributing to the patient's increased fatigue and symptoms.  The patient is currently taking fluoxetine 20 mg since November which has not been increased.  Will increase fluoxetine dose today and follow-up with the patient in approximately 3 to 4 weeks to see if there is any improvement in his symptoms from that.  At that time we may consider also adding a medication such as Wellbutrin or he may benefit from a medication such as Abilify or Seroquel to help with his sleep. Plan to discuss options with patient and make changes based on patient preference and joint decision making. - FLUoxetine (PROZAC) 20 MG capsule; Take 2 capsules (40 mg total) by mouth daily.  Dispense: 60 capsule; Refill: 3  2. Anxiety and depression Symptoms and presentation correlate with an exacerbation of anxiety and depressive symptoms.  Will trial an increase in fluoxetine to 40 mg/day. Patient to follow-up in 3 to 4 weeks with his primary care provider (Dr. Ashley Royalty). - FLUoxetine (PROZAC) 20 MG capsule; Take 2 capsules (40 mg total) by mouth daily.  Dispense: 60 capsule; Refill: 3   I discussed the assessment and treatment plan with the patient. The patient was provided an opportunity to ask questions and all were answered. The patient agreed with the plan and demonstrated an understanding of the instructions.   The patient was advised to call back or seek an in-person evaluation if the symptoms worsen or if the condition fails to improve as  anticipated.  I provided 22 minutes of non-face-to-face interaction with this MYCHART visit.   Tollie Eth, NP

## 2019-07-04 DIAGNOSIS — E785 Hyperlipidemia, unspecified: Secondary | ICD-10-CM | POA: Diagnosis not present

## 2019-07-04 DIAGNOSIS — E109 Type 1 diabetes mellitus without complications: Secondary | ICD-10-CM | POA: Diagnosis not present

## 2019-07-09 MED FILL — FIASP 100 UNIT/ML SOLN: 100 | 85 days supply | Qty: 60 | Fill #0

## 2019-07-11 ENCOUNTER — Emergency Department (HOSPITAL_BASED_OUTPATIENT_CLINIC_OR_DEPARTMENT_OTHER)
Admission: EM | Admit: 2019-07-11 | Discharge: 2019-07-12 | Disposition: A | Discharge status: Home / Self Care | Payer: 59 | Attending: Emergency Medicine | Admitting: Emergency Medicine

## 2019-07-11 ENCOUNTER — Other Ambulatory Visit: Payer: Self-pay

## 2019-07-11 ENCOUNTER — Encounter (HOSPITAL_BASED_OUTPATIENT_CLINIC_OR_DEPARTMENT_OTHER): Payer: Self-pay | Admitting: *Deleted

## 2019-07-11 DIAGNOSIS — T783XXA Angioneurotic edema, initial encounter: Secondary | ICD-10-CM | POA: Insufficient documentation

## 2019-07-11 DIAGNOSIS — E119 Type 2 diabetes mellitus without complications: Secondary | ICD-10-CM | POA: Diagnosis not present

## 2019-07-11 DIAGNOSIS — R600 Localized edema: Secondary | ICD-10-CM | POA: Diagnosis present

## 2019-07-11 MED ORDER — METHYLPREDNISOLONE SODIUM SUCC 125 MG IJ SOLR
INTRAMUSCULAR | Status: AC
  Filled 2019-07-11: qty 2

## 2019-07-11 MED ORDER — FAMOTIDINE IN NACL 20-0.9 MG/50ML-% IV SOLN
INTRAVENOUS | Status: AC
  Filled 2019-07-11: qty 50

## 2019-07-11 MED ORDER — DIPHENHYDRAMINE HCL 50 MG/ML IJ SOLN
25.0000 mg | Freq: Once | INTRAMUSCULAR | Status: AC
  Administered 2019-07-11: 12.5 mg via INTRAVENOUS

## 2019-07-11 MED ORDER — FAMOTIDINE IN NACL 20-0.9 MG/50ML-% IV SOLN
20.0000 mg | Freq: Once | INTRAVENOUS | Status: AC
  Administered 2019-07-11: 20 mg via INTRAVENOUS

## 2019-07-11 MED ORDER — METHYLPREDNISOLONE SODIUM SUCC 125 MG IJ SOLR
125.0000 mg | Freq: Once | INTRAMUSCULAR | Status: AC
  Administered 2019-07-11: 23:00:00 125 mg via INTRAVENOUS

## 2019-07-11 MED ORDER — DIPHENHYDRAMINE HCL 50 MG/ML IJ SOLN
INTRAMUSCULAR | Status: AC
  Filled 2019-07-11: qty 1

## 2019-07-11 NOTE — ED Triage Notes (Signed)
Pt c/o right lower lip swelling , Benadryl 25mg  PTA x 1 hr ago

## 2019-07-11 NOTE — ED Provider Notes (Signed)
Emergency Department Provider Note   I have reviewed the triage vital signs and the nursing notes.   HISTORY  Chief Complaint Facial Swelling   HPI Nathaniel [name] is a 54 y.o. male with multiple medical problems documented below who presents to the emergency department today with angioedema.  Patient states he was in his usual state of health about an hour prior to arrival when he noticed some mild swelling of his right upper and lower lip progressively worsening.  He took Benadryl did not seem to improve so presents here for further evaluation.  Has no history of the same.  He has no difficulty breathing, swallowing, lightheadedness, rash, syncope or other associated symptoms.  No fevers.  No recent illnesses.  No recent new medications.  Has been on lisinopril for many years.   No other associated or modifying symptoms.    Past Medical History:  Diagnosis Date  . Depression   . Diabetes mellitus    type II  . GERD (gastroesophageal reflux disease)   . Headache(784.0)   . Hypogonadism male   . Sleep apnea     Patient Active Problem List   Diagnosis Date Noted  . Scrotal rash 01/25/2019  . Pain in right knee 08/07/2018  . Anxiety and depression 07/03/2017  . Obstructive sleep apnea 06/09/2016  . Type 1 diabetes mellitus, uncontrolled ([facility]) 10/02/2015  . Cervical radiculitis 01/25/2015  . BPH (benign prostatic hyperplasia) 09/12/2014  . Neck pain 02/27/2013  . Chronic headaches 09/28/2011  . Seasonal and perennial allergic rhinitis 09/28/2011  . Fatigue 12/16/2008    Past Surgical History:  Procedure Laterality Date  . NASAL SINUS SURGERY  04/1991  . TONSILLECTOMY  1972    Current Outpatient Rx  . Order #: [id number] Class: Normal  . Order #: [id number] Class: Normal  . Order #: [id number] Class: Print  . Order #: [id number] Class: Normal  . Order #: [id number] Class: Historical Med  . Order #: [id number] Class: Normal  . Order #: [id number] Class: Normal  . Order #:  [id number] Class: Normal  . Order #: [id number] Class: Normal  . Order #: [id number] Class: Normal  . Order #: [id number] Class: Historical Med  . Order #: [id number] Class: Normal    Allergies Cymbalta [duloxetine hcl], Influenza vaccines, Penicillins, Pioglitazone, Promethazine hcl, and Reglan [metoclopramide]  Family History  Adopted: Yes  Problem Relation Age of Onset  . Cancer Father 74       Prostate cancer  . Healthy Mother   . Diabetes Mellitus I Son     Social History Social History   Tobacco Use  . Smoking status: Never Smoker  . Smokeless tobacco: Never Used  Substance Use Topics  . Alcohol use: No  . Drug use: No    Review of Systems  All other systems negative except as documented in the HPI. All pertinent positives and negatives as reviewed in the HPI. ____________________________________________   PHYSICAL EXAM:  VITAL SIGNS: ED Triage Vitals  Enc Vitals Group     BP 07/11/19 2226 132/80     Pulse Rate 07/11/19 2226 66     Resp 07/11/19 2226 18     Temp --      Temp src --      SpO2 07/11/19 2226 100 %     Weight 07/11/19 2225 220 lb (99.8 kg)     Height 07/11/19 2225 6' (1.829 m)    Constitutional: Alert and oriented. Well appearing and in no acute distress. Eyes: Conjunctivae are normal. PERRL. EOMI. Head: Atraumatic. Nose: No congestion/rhinnorhea.  Mouth/Throat: Mucous membranes are moist.  Oropharynx non-erythematous. Mild angioedema of right upper and lower lip. No tongue swelling. No sublingual edema. No stridor. No fullness or woodiness of neck on palpation.  Neck: No stridor.  No meningeal signs.   Cardiovascular: Normal rate, regular rhythm. Good peripheral circulation. Grossly normal heart sounds.   Respiratory: Normal respiratory effort.  No retractions. Lungs CTAB. Gastrointestinal: Soft and nontender. No distention.  Musculoskeletal: No lower extremity tenderness nor edema. No gross deformities of extremities. Neurologic:  Normal speech  and language. No gross focal neurologic deficits are appreciated.  Skin:  Skin is warm, dry and intact. No rash noted.  ____________________________________________   LABS (all labs ordered are listed, but only abnormal results are displayed)  Labs Reviewed - No data to display ____________________________________________  EKG   EKG Interpretation  Date/Time:    Ventricular Rate:    PR Interval:    QRS Duration:   QT Interval:    QTC Calculation:   R Axis:     Text Interpretation:         ____________________________________________  RADIOLOGY  No results found.  ____________________________________________   PROCEDURES  Procedure(s) performed:   Procedures   ____________________________________________   INITIAL IMPRESSION / ASSESSMENT AND PLAN / ED COURSE  Angioedema likely secondary to lisinopril.  Will observe for couple hours to ensure not worsening.  Rechecked around 2350 and improving cheek/lip swelling. Will continue to monitor.   Reevaluated around 0050 and still improving. No airway compromise. Expect continued improvement. Will monitor at home, wife is an Therapist, sports. rturn for worsening symptoms. Discuss with pcp regarding replacement for lisinopril.   Pertinent labs & imaging results that were available during my care of the patient were reviewed by me and considered in my medical decision making (see chart for details).  A medical screening exam was performed and I feel the patient has had an appropriate workup for their chief complaint at this time and likelihood of emergent condition existing is low. They have been counseled on decision, discharge, follow up and which symptoms necessitate immediate return to the emergency department. They or their family verbally stated understanding and agreement with plan and discharged in stable condition.   ____________________________________________  FINAL CLINICAL IMPRESSION(S) / ED DIAGNOSES  Final  diagnoses:  Angioedema, initial encounter     MEDICATIONS GIVEN DURING THIS VISIT:  Medications  methylPREDNISolone sodium succinate (SOLU-MEDROL) 125 mg/2 mL injection (has no administration in time range)  diphenhydrAMINE (BENADRYL) 50 MG/ML injection (has no administration in time range)  famotidine (PEPCID) 20-0.9 MG/50ML-% IVPB (has no administration in time range)  famotidine (PEPCID) IVPB 20 mg premix (0 mg Intravenous Stopped 07/11/19 2243)  methylPREDNISolone sodium succinate (SOLU-MEDROL) 125 mg/2 mL injection 125 mg (125 mg Intravenous Given 07/11/19 2237)  diphenhydrAMINE (BENADRYL) injection 25 mg (12.5 mg Intravenous Given 07/11/19 2237)     NEW OUTPATIENT MEDICATIONS STARTED DURING THIS VISIT:  Discharge Medication List as of 07/12/2019  1:04 AM      Note:  This note was prepared with assistance of Dragon voice recognition software. Occasional wrong-word or sound-a-like substitutions may have occurred due to the inherent limitations of voice recognition software.   Jazier Mcglamery, Corene Cornea, MD 07/12/19 [phone]

## 2019-07-12 NOTE — Discharge Instructions (Addendum)
Hold your Lisinopril and discuss options with your primary physician

## 2019-07-14 ENCOUNTER — Encounter: Payer: Self-pay | Admitting: Family Medicine

## 2019-07-15 ENCOUNTER — Encounter: Payer: Self-pay | Admitting: Family Medicine

## 2019-07-16 ENCOUNTER — Other Ambulatory Visit: Payer: Self-pay | Admitting: Family Medicine

## 2019-07-16 DIAGNOSIS — F321 Major depressive disorder, single episode, moderate: Secondary | ICD-10-CM

## 2019-07-16 DIAGNOSIS — T783XXA Angioneurotic edema, initial encounter: Secondary | ICD-10-CM | POA: Diagnosis not present

## 2019-07-22 DIAGNOSIS — Z9641 Presence of insulin pump (external) (internal): Secondary | ICD-10-CM | POA: Diagnosis not present

## 2019-07-22 DIAGNOSIS — Z794 Long term (current) use of insulin: Secondary | ICD-10-CM | POA: Diagnosis not present

## 2019-07-22 DIAGNOSIS — Z7189 Other specified counseling: Secondary | ICD-10-CM | POA: Diagnosis not present

## 2019-07-22 DIAGNOSIS — E785 Hyperlipidemia, unspecified: Secondary | ICD-10-CM | POA: Diagnosis not present

## 2019-07-22 DIAGNOSIS — E109 Type 1 diabetes mellitus without complications: Secondary | ICD-10-CM | POA: Diagnosis not present

## 2019-07-22 DIAGNOSIS — Z6829 Body mass index (BMI) 29.0-29.9, adult: Secondary | ICD-10-CM | POA: Diagnosis not present

## 2019-07-23 ENCOUNTER — Other Ambulatory Visit (HOSPITAL_BASED_OUTPATIENT_CLINIC_OR_DEPARTMENT_OTHER): Payer: Self-pay | Admitting: Internal Medicine

## 2019-07-23 MED FILL — FREESTYLE LANCETS: 83 days supply | Qty: 500 | Fill #0

## 2019-07-23 MED FILL — FREESTYLE LITE TEST STRIP: 83 days supply | Qty: 500 | Fill #0

## 2019-07-23 MED FILL — FREESTYLE LITE METER: 30 days supply | Qty: 1 | Fill #0

## 2019-07-23 MED FILL — FREESTYLE LIBRE 14 DAY SENS: 84 days supply | Qty: 6 | Fill #0

## 2019-07-30 ENCOUNTER — Ambulatory Visit (INDEPENDENT_AMBULATORY_CARE_PROVIDER_SITE_OTHER): Payer: 59 | Admitting: Psychologist

## 2019-07-30 DIAGNOSIS — F330 Major depressive disorder, recurrent, mild: Secondary | ICD-10-CM

## 2019-08-07 ENCOUNTER — Emergency Department
Admission: EM | Admit: 2019-08-07 | Discharge: 2019-08-07 | Disposition: A | Discharge status: Home / Self Care | Payer: 59 | Source: Home / Self Care | Attending: Internal Medicine | Admitting: Internal Medicine

## 2019-08-07 ENCOUNTER — Encounter: Payer: Self-pay | Admitting: *Deleted

## 2019-08-07 ENCOUNTER — Other Ambulatory Visit: Payer: Self-pay

## 2019-08-07 DIAGNOSIS — K2900 Acute gastritis without bleeding: Secondary | ICD-10-CM | POA: Diagnosis not present

## 2019-08-07 MED ORDER — ONDANSETRON 4 MG PO TBDP: 4.0000 mg | ORAL_TABLET | Freq: Three times a day (TID) | ORAL | 0 refills | Status: DC | PRN

## 2019-08-07 MED ORDER — PANTOPRAZOLE SODIUM 20 MG PO TBEC: 20.0000 mg | DELAYED_RELEASE_TABLET | Freq: Two times a day (BID) | ORAL | 0 refills | Status: DC

## 2019-08-07 MED FILL — PANTOPRAZOLE SOD DR 20 MG T: 20 | 30 days supply | Qty: 60 | Fill #0

## 2019-08-07 MED FILL — ONDANSETRON ODT 4MG TBDP: 4 | 7 days supply | Qty: 20 | Fill #0

## 2019-08-07 NOTE — ED Triage Notes (Addendum)
Pt c/o mid epigastric pain, nausea and bloating x 2 wks. He vomited 1 x yesterday. He also notes burping and belching. Denies fever. Reports a complete change in diet 2 wks ago to a high protein, low carb diet. He reports hat has helped his BS. Denies any changes to BM. Took Zofran and Pepcid at home just prior to arrival.

## 2019-08-07 NOTE — ED Provider Notes (Signed)
[name]    CSN: [id number] Arrival date & time: 08/07/19  1042      History   Chief Complaint Chief Complaint  Patient presents with  . Nausea    HPI Nathaniel [name] is a 54 y.o. male with a history of diabetes mellitus type 2 comes to urgent care with complaints of midepigastric pain and one episode of nonbilious/nonbloody vomiting over the past couple of weeks.  Symptoms started insidiously and has been getting progressively worse.  It appears 12 intensified after he changed his diet to a low carb high-protein diet.  Patient blood sugars have been stable.  He denies any history of gastroparesis.  Bowel movements are normal.  No melanotic stool.  Patient complains of some abdominal bloating.  He has lost 5 pounds over the past couple of weeks.  This is intentional weight loss.  No chest pain or chest pressure.Marland Kitchen   HPI  Past Medical History:  Diagnosis Date  . Depression   . Diabetes mellitus    type II  . GERD (gastroesophageal reflux disease)   . Headache(784.0)   . Hypogonadism male   . Sleep apnea     Patient Active Problem List   Diagnosis Date Noted  . Scrotal rash 01/25/2019  . Pain in right knee 08/07/2018  . Anxiety and depression 07/03/2017  . Obstructive sleep apnea 06/09/2016  . Type 1 diabetes mellitus, uncontrolled ([place]) 10/02/2015  . Cervical radiculitis 01/25/2015  . BPH (benign prostatic hyperplasia) 09/12/2014  . Neck pain 02/27/2013  . Chronic headaches 09/28/2011  . Seasonal and perennial allergic rhinitis 09/28/2011  . Fatigue 12/16/2008    Past Surgical History:  Procedure Laterality Date  . NASAL SINUS SURGERY  04/1991  . TONSILLECTOMY  1972       Home Medications    Prior to Admission medications   Medication Sig Start Date End Date Taking? Authorizing Provider  FLUoxetine (PROZAC) 20 MG capsule Take 20 mg by mouth daily.   Yes [provider]  Alcohol Swabs (ALCOHOL PREPS) PADS Use alcohol pads to clean site  to be used to check blood sugar. 05/02/14   Philemon Kingdom, MD  AMBULATORY NON FORMULARY MEDICATION Change CPAP setting to 13.5 (14 is ok if 13.5 is not available). Please provide new machine as old machine was not fixable and Mr Middlesworth has improved AHI with new machine. 07/19/18   Gregor Hams, MD  Blood Glucose Monitoring Suppl (CONTOUR NEXT MONITOR) w/Device KIT 1 Device by Does not apply route daily. Use to test blood sugar daily 04/03/18   Philemon Kingdom, MD  Ferrous Sulfate (IRON HIGH-POTENCY) 142 (45 Fe) MG TBCR Take 1 tablet by mouth daily.    [provider]  GLUCAGON EMERGENCY 1 MG injection INJECT 1 MG INTO THE MUSCLE ONCE AS NEEDED. 06/23/17   Philemon Kingdom, MD  glucose blood (CONTOUR NEXT TEST) test strip Use as instructed to check blood sugar 5 times daily. 09/12/17   Philemon Kingdom, MD  insulin lispro (HUMALOG) 100 UNIT/ML injection INJECT up to 70 units INTO THE SKIN DAILY-in the insulin pump 08/10/18   Philemon Kingdom, MD  Lancets (FREESTYLE) lancets Use as instructed to check sugar 4 times daily. 04/27/16   Philemon Kingdom, MD  lisinopril (ZESTRIL) 2.5 MG tablet TAKE 1 TABLET (2.5 MG TOTAL) BY MOUTH DAILY. 05/03/19   Luetta Nutting, DO  metFORMIN (GLUCOPHAGE-XR) 500 MG 24 hr tablet TAKE 4 TABLETS BY MOUTH DAILY in the morning 01/04/18   Philemon Kingdom, MD  MULTIPLE VITAMIN PO Take 1 tablet by mouth daily.    [provider]  ondansetron (ZOFRAN ODT) 4 MG disintegrating tablet Take 1 tablet (4 mg total) by mouth every 8 (eight) hours as needed for nausea or vomiting. 08/07/19   Brylee Mcgreal, Myrene Galas, MD  pantoprazole (PROTONIX) 20 MG tablet Take 1 tablet (20 mg total) by mouth 2 (two) times daily. 08/07/19 09/06/19  Chase Picket, MD  ALPRAZolam Duanne Moron) 0.5 MG tablet Take 1 tablet (0.5 mg total) by mouth daily as needed for anxiety. 02/11/19 03/26/19  Silverio Decamp, MD    Family History Family History  Adopted: Yes  Problem Relation Age of Onset  .  Cancer Father 58       Prostate cancer  . Healthy Mother   . Diabetes Mellitus I Son     Social History Social History   Tobacco Use  . Smoking status: Never Smoker  . Smokeless tobacco: Never Used  Substance Use Topics  . Alcohol use: No  . Drug use: No     Allergies   Cymbalta [duloxetine hcl], Influenza vaccines, Penicillins, Ace inhibitors, Pioglitazone, Promethazine hcl, and Reglan [metoclopramide]   Review of Systems Review of Systems  Constitutional: Negative for activity change, fatigue and fever.  HENT: Negative.   Respiratory: Negative.   Gastrointestinal: Positive for abdominal pain, nausea and vomiting. Negative for constipation and diarrhea.  Genitourinary: Negative for dysuria, frequency and urgency.  Musculoskeletal: Negative.   Skin: Negative.   Neurological: Negative.   Psychiatric/Behavioral: Negative for confusion and decreased concentration.     Physical Exam Triage Vital Signs ED Triage Vitals  Enc Vitals Group     BP 08/07/19 1131 116/76     Pulse Rate 08/07/19 1131 68     Resp 08/07/19 1131 16     Temp 08/07/19 1131 98.8 F (37.1 C)     Temp Source 08/07/19 1131 Oral     SpO2 08/07/19 1131 99 %     Weight 08/07/19 1129 216 lb (98 kg)     Height 08/07/19 1129 6' (1.829 m)     Head Circumference --      Peak Flow --      Pain Score 08/07/19 1128 1     Pain Loc --      Pain Edu? --      Excl. in [place]? --    No data found.  Updated Vital Signs BP 116/76 (BP Location: Right Arm)   Pulse 68   Temp 98.8 F (37.1 C) (Oral)   Resp 16   Ht 6' (1.829 m)   Wt 98 kg   SpO2 99%   BMI 29.29 kg/m   Visual Acuity Right Eye Distance:   Left Eye Distance:   Bilateral Distance:    Right Eye Near:   Left Eye Near:    Bilateral Near:     Physical Exam Vitals and nursing note reviewed.  Constitutional:      General: He is not in acute distress.    Appearance: He is not ill-appearing or diaphoretic.  Cardiovascular:     Rate and  Rhythm: Normal rate and regular rhythm.     Pulses: Normal pulses.     Heart sounds: Normal heart sounds. No murmur.  Pulmonary:     Effort: Pulmonary effort is normal.     Breath sounds: Normal breath sounds. No stridor. No wheezing or rhonchi.  Abdominal:     General: Bowel sounds are normal.     Palpations:  Abdomen is soft.     Tenderness: There is abdominal tenderness. There is no guarding or rebound.  Skin:    Capillary Refill: Capillary refill takes less than 2 seconds.  Neurological:     General: No focal deficit present.     Mental Status: He is alert.      UC Treatments / Results  Labs (all labs ordered are listed, but only abnormal results are displayed) Labs Reviewed - No data to display  EKG   Radiology No results found.  Procedures Procedures (including critical care time)  Medications Ordered in UC Medications - No data to display  Initial Impression / Assessment and Plan / UC Course  I have reviewed the triage vital signs and the nursing notes.  Pertinent labs & imaging results that were available during my care of the patient were reviewed by me and considered in my medical decision making (see chart for details).     1.  Acute gastritis: Protonix 20 mg twice daily for 1 month Zofran as needed for nausea If patient symptoms persist after 1 month he will benefit from gastroenterology evaluation/endoscopy. Return precautions given Patient was commended on lifestyle changes. Final Clinical Impressions(s) / UC Diagnoses   Final diagnoses:  Other acute gastritis without hemorrhage   Discharge Instructions   None    ED Prescriptions    Medication Sig Dispense Auth. Provider   pantoprazole (PROTONIX) 20 MG tablet Take 1 tablet (20 mg total) by mouth 2 (two) times daily. 60 tablet Lafawn Lenoir, Myrene Galas, MD   ondansetron (ZOFRAN ODT) 4 MG disintegrating tablet Take 1 tablet (4 mg total) by mouth every 8 (eight) hours as needed for nausea or vomiting. 20  tablet Allin Frix, Myrene Galas, MD     PDMP not reviewed this encounter.   Chase Picket, MD 08/07/19 1240

## 2019-08-12 ENCOUNTER — Ambulatory Visit (INDEPENDENT_AMBULATORY_CARE_PROVIDER_SITE_OTHER): Payer: 59 | Admitting: Psychologist

## 2019-08-12 DIAGNOSIS — F330 Major depressive disorder, recurrent, mild: Secondary | ICD-10-CM | POA: Diagnosis not present

## 2019-08-12 MED FILL — FLUoxetine HCL 20 MG CAPS: 20 | 30 days supply | Qty: 30 | Fill #1

## 2019-08-14 MED FILL — METFORMIN HCL 1000 MG TABS: 1000 | 90 days supply | Qty: 180 | Fill #0

## 2019-09-02 ENCOUNTER — Ambulatory Visit: Payer: 59 | Admitting: Psychologist

## 2019-09-12 MED FILL — FLUoxetine HCL 20 MG CAPS: 20 | 30 days supply | Qty: 30 | Fill #2

## 2019-10-04 DIAGNOSIS — G4733 Obstructive sleep apnea (adult) (pediatric): Secondary | ICD-10-CM | POA: Diagnosis not present

## 2019-10-15 DIAGNOSIS — E109 Type 1 diabetes mellitus without complications: Secondary | ICD-10-CM | POA: Diagnosis not present

## 2019-10-18 MED FILL — FLUoxetine HCL 20 MG CAPS: 20 | 30 days supply | Qty: 30 | Fill #3

## 2019-10-22 DIAGNOSIS — Z9641 Presence of insulin pump (external) (internal): Secondary | ICD-10-CM | POA: Diagnosis not present

## 2019-10-22 DIAGNOSIS — E109 Type 1 diabetes mellitus without complications: Secondary | ICD-10-CM | POA: Diagnosis not present

## 2019-10-22 DIAGNOSIS — Z6829 Body mass index (BMI) 29.0-29.9, adult: Secondary | ICD-10-CM | POA: Diagnosis not present

## 2019-10-22 DIAGNOSIS — Z794 Long term (current) use of insulin: Secondary | ICD-10-CM | POA: Diagnosis not present

## 2019-10-22 DIAGNOSIS — E785 Hyperlipidemia, unspecified: Secondary | ICD-10-CM | POA: Diagnosis not present

## 2019-10-23 MED FILL — HumaLOG 100 UNIT/ML SOLN: 100 | 90 days supply | Qty: 70 | Fill #0

## 2019-10-28 ENCOUNTER — Ambulatory Visit: Payer: 59 | Admitting: Family Medicine

## 2019-10-28 MED FILL — FREESTYLE LIBRE 14 DAY SENS: 84 days supply | Qty: 6 | Fill #1

## 2019-11-04 ENCOUNTER — Ambulatory Visit: Payer: 59 | Admitting: Family Medicine

## 2019-11-13 DIAGNOSIS — E1065 Type 1 diabetes mellitus with hyperglycemia: Secondary | ICD-10-CM | POA: Diagnosis not present

## 2019-11-19 ENCOUNTER — Other Ambulatory Visit: Payer: Self-pay | Admitting: Family Medicine

## 2019-11-19 ENCOUNTER — Other Ambulatory Visit (HOSPITAL_BASED_OUTPATIENT_CLINIC_OR_DEPARTMENT_OTHER): Payer: Self-pay | Admitting: Internal Medicine

## 2019-11-19 MED FILL — METFORMIN HCL 1000 MG TABS: 1000 | 90 days supply | Qty: 180 | Fill #0

## 2019-11-19 MED FILL — FLUoxetine HCL 20 MG CAPS: 20 | 30 days supply | Qty: 30 | Fill #0

## 2019-11-21 ENCOUNTER — Other Ambulatory Visit: Payer: Self-pay

## 2019-11-21 ENCOUNTER — Encounter: Payer: Self-pay | Admitting: Family Medicine

## 2019-11-21 ENCOUNTER — Other Ambulatory Visit: Payer: Self-pay | Admitting: Family Medicine

## 2019-11-21 ENCOUNTER — Telehealth (INDEPENDENT_AMBULATORY_CARE_PROVIDER_SITE_OTHER): Payer: 59 | Admitting: Family Medicine

## 2019-11-21 DIAGNOSIS — K219 Gastro-esophageal reflux disease without esophagitis: Secondary | ICD-10-CM | POA: Insufficient documentation

## 2019-11-21 MED ORDER — PANTOPRAZOLE SODIUM 40 MG PO TBEC: 40.0000 mg | DELAYED_RELEASE_TABLET | Freq: Every day | ORAL | 3 refills | Status: DC

## 2019-11-21 MED FILL — PANTOPRAZOLE SOD DR 40 MG T: 40 | 90 days supply | Qty: 90 | Fill #0

## 2019-11-21 NOTE — Progress Notes (Signed)
Sore throat, tickling sensation x 2 weeks. No fever.

## 2019-11-21 NOTE — Assessment & Plan Note (Addendum)
Scratchy throat and cough that improved with protonix previously. Will plan to restart with recommendations to follow up with me if not improving over the next few weeks.

## 2019-11-21 NOTE — Progress Notes (Signed)
RUDELL ORTMAN - 54 y.o. male MRN [id number]  Date of birth: December 30, 1965   This visit type was conducted due to national recommendations for restrictions regarding the COVID-19 Pandemic (e.g. social distancing).  This format is felt to be most appropriate for this patient at this time.  All issues noted in this document were discussed and addressed.  No physical exam was performed (except for noted visual exam findings with Video Visits).  I discussed the limitations of evaluation and management by telemedicine and the availability of in person appointments. The patient expressed understanding and agreed to proceed.  I connected with@ on 11/21/19 at  3:40 PM EDT by a video enabled telemedicine application and verified that I am speaking with the correct person using two identifiers.  Present at visit: Luetta Nutting, DO Jodie Echevaria   Patient Location: [place] [address]   Provider location:   [facility]  Chief Complaint  Patient presents with  . Sore Throat    HPI  WILMAN TUCKER is a 54 y.o. male who presents via audio/video conferencing for a telehealth visit today.  He has complaint of scratchy throat sensation and feeling like he needs to clear his throat.  He has had increased reflux symptoms since running out of protonix which he thinks is contributing.  He did have some similar symptoms which improved after starting protonix.  He denies shortness of breath, chest pain, post nasal drainage or wheezing.    ROS:  A comprehensive ROS was completed and negative except as noted per HPI  Past Medical History:  Diagnosis Date  . Depression   . Diabetes mellitus    type II  . GERD (gastroesophageal reflux disease)   . Headache(784.0)   . Hypogonadism male   . Sleep apnea     Past Surgical History:  Procedure Laterality Date  . NASAL SINUS SURGERY  04/1991  . TONSILLECTOMY  1972    Family History  Adopted: Yes  Problem Relation Age of Onset  . Cancer  Father 43       Prostate cancer  . Healthy Mother   . Diabetes Mellitus I Son     Social History   Socioeconomic History  . Marital status: Married    Spouse name: [name]  . Number of children: 2  . Years of education: Not on file  . Highest education level: Not on file  Occupational History    Employer: [employer]    Comment: Mental Health Case Management  Tobacco Use  . Smoking status: Never Smoker  . Smokeless tobacco: Never Used  Vaping Use  . Vaping Use: Never used  Substance and Sexual Activity  . Alcohol use: No  . Drug use: No  . Sexual activity: Not on file  Other Topics Concern  . Not on file  Social History Narrative   He lives with wife and two children.   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 Difficulty of Paying Living Expenses: Not on file  Food Insecurity:   . Worried About Charity fundraiser in the Last Year: Not on file  . Ran Out of Food in the Last Year: Not on file  Transportation Needs:   . Lack of Transportation (Medical): Not on file  . Lack of Transportation (Non-Medical): Not on file  Physical Activity:   .  Days of Exercise per Week: Not on file  . Minutes of Exercise per Session: Not on file  Stress:   . Feeling of Stress : Not on file  Social Connections:   . Frequency of Communication with Friends and Family: Not on file  . Frequency of Social Gatherings with Friends and Family: Not on file  . Attends Religious Services: Not on file  . Active Member of Clubs or Organizations: Not on file  . Attends Archivist Meetings: Not on file  . Marital Status: Not on file  Intimate Partner Violence:   . Fear of Current or Ex-Partner: Not on file  . Emotionally Abused: Not on file  . Physically Abused: Not on file  . Sexually Abused: Not on file     Current Outpatient  Medications:  .  Alcohol Swabs (ALCOHOL PREPS) PADS, Use alcohol pads to clean site to be used to check blood sugar., Disp: 100 each, Rfl: 11 .  AMBULATORY NON FORMULARY MEDICATION, Change CPAP setting to 13.5 (14 is ok if 13.5 is not available). Please provide new machine as old machine was not fixable and Mr Soley has improved AHI with new machine., Disp: 1 each, Rfl: 0 .  Blood Glucose Monitoring Suppl (CONTOUR NEXT MONITOR) w/Device KIT, 1 Device by Does not apply route daily. Use to test blood sugar daily, Disp: 1 kit, Rfl: 1 .  Ferrous Sulfate (IRON HIGH-POTENCY) 142 (45 Fe) MG TBCR, Take 1 tablet by mouth daily., Disp: , Rfl:  .  FLUoxetine (PROZAC) 20 MG capsule, TAKE 1 CAPSULE (20 MG TOTAL) BY MOUTH DAILY., Disp: 30 capsule, Rfl: 0 .  GLUCAGON EMERGENCY 1 MG injection, INJECT 1 MG INTO THE MUSCLE ONCE AS NEEDED., Disp: 1 kit, Rfl: 3 .  glucose blood (CONTOUR NEXT TEST) test strip, Use as instructed to check blood sugar 5 times daily., Disp: 450 each, Rfl: 12 .  insulin lispro (HUMALOG) 100 UNIT/ML injection, INJECT up to 70 units INTO THE SKIN DAILY-in the insulin pump, Disp: 30 mL, Rfl: 11 .  Lancets (FREESTYLE) lancets, Use as instructed to check sugar 4 times daily., Disp: 400 each, Rfl: 5 .  lisinopril (ZESTRIL) 2.5 MG tablet, TAKE 1 TABLET (2.5 MG TOTAL) BY MOUTH DAILY., Disp: 90 tablet, Rfl: 3 .  metFORMIN (GLUCOPHAGE-XR) 500 MG 24 hr tablet, TAKE 4 TABLETS BY MOUTH DAILY in the morning, Disp: 360 tablet, Rfl: 3 .  MULTIPLE VITAMIN PO, Take 1 tablet by mouth daily., Disp: , Rfl:  .  ondansetron (ZOFRAN ODT) 4 MG disintegrating tablet, Take 1 tablet (4 mg total) by mouth every 8 (eight) hours as needed for nausea or vomiting., Disp: 20 tablet, Rfl: 0 .  pantoprazole (PROTONIX) 40 MG tablet, Take 1 tablet (40 mg total) by mouth daily., Disp: 30 tablet, Rfl: 3  EXAM:  VITALS per patient if applicable: Wt 213 lb (96.6 kg)   BMI 28.89 kg/m   GENERAL: alert, oriented, appears well and  in no acute distress  HEENT: atraumatic, conjunttiva clear, no obvious abnormalities on inspection of external nose and ears  NECK: normal movements of the head and neck  LUNGS: on inspection no signs of respiratory distress, breathing rate appears normal, no obvious gross SOB, gasping or wheezing  CV: no obvious cyanosis  MS: moves all visible extremities without noticeable abnormality  PSYCH/NEURO: pleasant and cooperative, no obvious depression or anxiety, speech and thought processing grossly intact  ASSESSMENT AND PLAN:  Discussed the following assessment and plan:  GERD (gastroesophageal  reflux disease) Scratchy throat and cough that improved with protonix previously. Will plan to restart with recommendations to follow up with me if not improving over the next few weeks.    Meds ordered this encounter  Medications  . pantoprazole (PROTONIX) 40 MG tablet    Sig: Take 1 tablet (40 mg total) by mouth daily.    Dispense:  30 tablet    Refill:  3       I discussed the assessment and treatment plan with the patient. The patient was provided an opportunity to ask questions and all were answered. The patient agreed with the plan and demonstrated an understanding of the instructions.   The patient was advised to call back or seek an in-person evaluation if the symptoms worsen or if the condition fails to improve as anticipated.    Luetta Nutting, DO

## 2019-11-28 ENCOUNTER — Encounter: Payer: Self-pay | Admitting: Family Medicine

## 2019-11-29 NOTE — Telephone Encounter (Signed)
Yes, would recommend appt to discuss.    Thanks!

## 2019-12-02 ENCOUNTER — Ambulatory Visit: Payer: 59 | Admitting: Family Medicine

## 2019-12-24 ENCOUNTER — Other Ambulatory Visit: Payer: Self-pay | Admitting: Family Medicine

## 2019-12-24 MED FILL — FLUoxetine HCL 20 MG CAPS: 20 | 30 days supply | Qty: 30 | Fill #0

## 2019-12-26 ENCOUNTER — Telehealth (INDEPENDENT_AMBULATORY_CARE_PROVIDER_SITE_OTHER): Payer: 59 | Admitting: Family Medicine

## 2019-12-26 ENCOUNTER — Other Ambulatory Visit: Payer: Self-pay | Admitting: Family Medicine

## 2019-12-26 ENCOUNTER — Encounter: Payer: Self-pay | Admitting: Family Medicine

## 2019-12-26 DIAGNOSIS — G8929 Other chronic pain: Secondary | ICD-10-CM

## 2019-12-26 DIAGNOSIS — R519 Headache, unspecified: Secondary | ICD-10-CM | POA: Diagnosis not present

## 2019-12-26 DIAGNOSIS — R197 Diarrhea, unspecified: Secondary | ICD-10-CM

## 2019-12-26 MED ORDER — NURTEC 75 MG PO TBDP: 1.0000 | ORAL_TABLET | Freq: Every day | ORAL | 3 refills | Status: DC

## 2019-12-26 MED FILL — NURTEC 75 MG TBDP: 75 | 30 days supply | Qty: 16 | Fill #0

## 2019-12-26 NOTE — Progress Notes (Signed)
1. Attempted to contact patient at 825am. LVM for callback for pre-visit  2. Attempted to contact patient at 830am on both home and cell phone. LVM.

## 2019-12-26 NOTE — Assessment & Plan Note (Signed)
Intermittent nature does not sound infectious.  ?IBS or diet related.  Recommend keeping diary of symptoms, foods eaten and mood/anxiety level.

## 2019-12-26 NOTE — Progress Notes (Signed)
Nathaniel [name] - 54 y.o. male MRN [id number]  Date of birth: 1966-01-01   This visit type was conducted due to national recommendations for restrictions regarding the COVID-19 Pandemic (e.g. social distancing).  This format is felt to be most appropriate for this patient at this time.  All issues noted in this document were discussed and addressed.  No physical exam was performed (except for noted visual exam findings with Video Visits).  I discussed the limitations of evaluation and management by telemedicine and the availability of in person appointments. The patient expressed understanding and agreed to proceed.  I connected with@ on 12/26/19 at  8:30 AM EDT by a video enabled telemedicine application and verified that I am speaking with the correct person using two identifiers.  Present at visit: Nathaniel Nutting, DO Nathaniel [name]   Patient Location: Home [address] [name] [id number]   Provider location:   [facility]  Chief Complaint  Patient presents with  . Diarrhea  . Headache    HPI  Nathaniel [name] is a 54 y.o. male who presents via audio/video conferencing for a telehealth visit today.  He has complaint of recurrent headaches.  He states he has headaches several times per week.  Symptoms started a few weeks ago. Headaches located in front and sides of head.  He has history of migraines and was seen by neurology previously.  He was tried on several medications and had injections without much improvement.  Headaches eventually resolved but have recently returned.  He does have some mild nausea.  Denies light sensitivity.    He has also had some intermittent diarrhea over the past few weeks.  Stools are loose but not watery.  Has not had abdominal pain.  No blood in his stool.  Has tried adding probiotic with some improvement.    ROS:  A comprehensive ROS was completed and negative except as noted per HPI     Past Medical History:  Diagnosis Date  . Depression   .  Diabetes mellitus    type II  . GERD (gastroesophageal reflux disease)   . Headache(784.0)   . Hypogonadism male   . Sleep apnea     Past Surgical History:  Procedure Laterality Date  . NASAL SINUS SURGERY  04/1991  . TONSILLECTOMY  1972    Family History  Adopted: Yes  Problem Relation Age of Onset  . Cancer Father 72       Prostate cancer  . Healthy Mother   . Diabetes Mellitus I Son     Social History   Socioeconomic History  . Marital status: Married    Spouse name: Nathaniel [name]  . Number of children: 2  . Years of education: Not on file  . Highest education level: Not on file  Occupational History    Employer: [employer]    Comment: Mental Health Case Management  Tobacco Use  . Smoking status: Never Smoker  . Smokeless tobacco: Never Used  Vaping Use  . Vaping Use: Never used  Substance and Sexual Activity  . Alcohol use: No  . Drug use: No  . Sexual activity: Not on file  Other Topics Concern  . Not on file  Social History Narrative   He lives with wife and two children.   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 Difficulty of  Paying Living Expenses: Not on file  Food Insecurity:   . Worried About Charity fundraiser in the Last Year: Not on file  . Ran Out of Food in the Last Year: Not on file  Transportation Needs:   . Lack of Transportation (Medical): Not on file  . Lack of Transportation (Non-Medical): Not on file  Physical Activity:   . Days of Exercise per Week: Not on file  . Minutes of Exercise per Session: Not on file  Stress:   . Feeling of Stress : Not on file  Social Connections:   . Frequency of Communication with Friends and Family: Not on file  . Frequency of Social Gatherings with Friends and Family: Not on file  . Attends Religious Services: Not on file  . Active Member of Clubs or  Organizations: Not on file  . Attends Archivist Meetings: Not on file  . Marital Status: Not on file  Intimate Partner Violence:   . Fear of Current or Ex-Partner: Not on file  . Emotionally Abused: Not on file  . Physically Abused: Not on file  . Sexually Abused: Not on file     Current Outpatient Medications:  .  Alcohol Swabs (ALCOHOL PREPS) PADS, Use alcohol pads to clean site to be used to check blood sugar., Disp: 100 each, Rfl: 11 .  AMBULATORY NON FORMULARY MEDICATION, Change CPAP setting to 13.5 (14 is ok if 13.5 is not available). Please provide new machine as old machine was not fixable and Nathaniel [name] has improved AHI with new machine., Disp: 1 each, Rfl: 0 .  Blood Glucose Monitoring Suppl (CONTOUR NEXT MONITOR) w/Device KIT, 1 Device by Does not apply route daily. Use to test blood sugar daily, Disp: 1 kit, Rfl: 1 .  Continuous Blood Gluc Sensor (FREESTYLE LIBRE 14 DAY SENSOR) MISC, Apply topically every 14 (fourteen) days., Disp: , Rfl:  .  Ferrous Sulfate (IRON HIGH-POTENCY) 142 (45 Fe) MG TBCR, Take 1 tablet by mouth daily., Disp: , Rfl:  .  FIASP 100 UNIT/ML SOLN, SMARTSIG:70 Unit(s) SUB-Q Daily, Disp: , Rfl:  .  FLUoxetine (PROZAC) 20 MG capsule, TAKE 1 CAPSULE BY MOUTH ONCE DAILY **NEEDS APPOINTMENT FOR FURTHER REFILLS**, Disp: 30 capsule, Rfl: 0 .  GLUCAGON EMERGENCY 1 MG injection, INJECT 1 MG INTO THE MUSCLE ONCE AS NEEDED., Disp: 1 kit, Rfl: 3 .  glucose blood (CONTOUR NEXT TEST) test strip, Use as instructed to check blood sugar 5 times daily., Disp: 450 each, Rfl: 12 .  glucose blood test strip, USE TO CHECK BLOOD SUGAR 5 TO 6 TIMES A DAY, Disp: , Rfl:  .  insulin lispro (HUMALOG) 100 UNIT/ML injection, INJECT up to 70 units INTO THE SKIN DAILY-in the insulin pump, Disp: 30 mL, Rfl: 11 .  Lancets (FREESTYLE) lancets, Use as instructed to check sugar 4 times daily., Disp: 400 each, Rfl: 5 .  metFORMIN (GLUCOPHAGE) 1000 MG tablet, Take 2,000 mg by mouth daily.,  Disp: , Rfl:  .  metFORMIN (GLUCOPHAGE-XR) 500 MG 24 hr tablet, TAKE 4 TABLETS BY MOUTH DAILY in the morning, Disp: 360 tablet, Rfl: 3 .  MULTIPLE VITAMIN PO, Take 1 tablet by mouth daily., Disp: , Rfl:  .  ondansetron (ZOFRAN ODT) 4 MG disintegrating tablet, Take 1 tablet (4 mg total) by mouth every 8 (eight) hours as needed for nausea or vomiting., Disp: 20 tablet, Rfl: 0 .  pantoprazole (PROTONIX) 40 MG tablet, Take 1 tablet (40 mg total) by mouth daily., Disp:  30 tablet, Rfl: 3 .  Rimegepant Sulfate (NURTEC) 75 MG TBDP, Take 1 tablet by mouth daily., Disp: 30 tablet, Rfl: 3  EXAM:  VITALS per patient if applicable: There were no vitals taken for this visit.  GENERAL: alert, oriented, appears well and in no acute distress  HEENT: atraumatic, conjunttiva clear, no obvious abnormalities on inspection of external nose and ears  NECK: normal movements of the head and neck  LUNGS: on inspection no signs of respiratory distress, breathing rate appears normal, no obvious gross SOB, gasping or wheezing  CV: no obvious cyanosis  MS: moves all visible extremities without noticeable abnormality  PSYCH/NEURO: pleasant and cooperative, no obvious depression or anxiety, speech and thought processing grossly intact  ASSESSMENT AND PLAN:  Discussed the following assessment and plan:  Chronic headaches Previous notes reviewed and had improvement with topamax however he does not recall taking anything that worked well for him.  Will provide trial of nurtec daily for migraine prevention If not improving with this will place referral to neurology.    Diarrhea Intermittent nature does not sound infectious.  ?IBS or diet related.  Recommend keeping diary of symptoms, foods eaten and mood/anxiety level.       I discussed the assessment and treatment plan with the patient. The patient was provided an opportunity to ask questions and all were answered. The patient agreed with the plan and  demonstrated an understanding of the instructions.   The patient was advised to call back or seek an in-person evaluation if the symptoms worsen or if the condition fails to improve as anticipated.    Nathaniel Nutting, DO

## 2019-12-26 NOTE — Assessment & Plan Note (Signed)
Previous notes reviewed and had improvement with topamax however he does not recall taking anything that worked well for him.  Will provide trial of nurtec daily for migraine prevention If not improving with this will place referral to neurology.

## 2019-12-30 ENCOUNTER — Other Ambulatory Visit: Payer: Self-pay | Admitting: Family Medicine

## 2019-12-30 ENCOUNTER — Encounter: Payer: Self-pay | Admitting: Family Medicine

## 2019-12-30 DIAGNOSIS — R519 Headache, unspecified: Secondary | ICD-10-CM

## 2019-12-30 DIAGNOSIS — G8929 Other chronic pain: Secondary | ICD-10-CM

## 2019-12-31 ENCOUNTER — Telehealth: Payer: Self-pay

## 2019-12-31 NOTE — Telephone Encounter (Signed)
PA not done. Patient reported medication did not work for him.  [name]" to Nathaniel Coombe, DO    12/30/19 1:27 PM The migraine medication you prescribed for me Nurtec doesn't work for me.  The days I have taken it I have had headaches all day long.  I think my headaches are more like tension headaches than migraines.  You may need to go ahead with the referral you mentioned. [name]

## 2020-01-02 DIAGNOSIS — G4733 Obstructive sleep apnea (adult) (pediatric): Secondary | ICD-10-CM | POA: Diagnosis not present

## 2020-01-07 NOTE — Telephone Encounter (Signed)
I will forward to Dr. Ashley Royalty to address when he returns.

## 2020-01-07 NOTE — Telephone Encounter (Signed)
Sounds like his CPAP is working well and is effective.  It looks like he is tried multiple medications in the past.  Per Dr. Ashley Royalty notes he did try Topamax at one point and had some mild improvement.  We could consider Trokendi if he would like while waiting to get in with neurology.  He can also discuss getting on a waiting list with neurology to see if they could get him in if they have a cancellation as well.

## 2020-01-08 ENCOUNTER — Other Ambulatory Visit: Payer: Self-pay | Admitting: Family Medicine

## 2020-01-08 MED ORDER — TROKENDI XR 25 MG PO CP24: ORAL_CAPSULE | ORAL | 1 refills | Status: DC

## 2020-01-08 MED ORDER — FLUOXETINE HCL 40 MG PO CAPS: ORAL_CAPSULE | ORAL | 1 refills | Status: DC

## 2020-01-08 MED FILL — TROKENDI XR 25 MG CAPSULE: 25 | 34 days supply | Qty: 60 | Fill #0

## 2020-01-08 MED FILL — FLUoxetine HCL 40 MG CAPS: 40 | 90 days supply | Qty: 90 | Fill #0

## 2020-01-09 ENCOUNTER — Encounter: Payer: Self-pay | Admitting: Neurology

## 2020-01-09 ENCOUNTER — Other Ambulatory Visit: Payer: Self-pay

## 2020-01-09 ENCOUNTER — Ambulatory Visit: Payer: 59 | Admitting: Neurology

## 2020-01-09 VITALS — BP 119/70 | HR 72 | Ht 72.0 in | Wt 219.0 lb

## 2020-01-09 DIAGNOSIS — Z789 Other specified health status: Secondary | ICD-10-CM | POA: Diagnosis not present

## 2020-01-09 DIAGNOSIS — G4440 Drug-induced headache, not elsewhere classified, not intractable: Secondary | ICD-10-CM | POA: Diagnosis not present

## 2020-01-09 DIAGNOSIS — R519 Headache, unspecified: Secondary | ICD-10-CM | POA: Diagnosis not present

## 2020-01-09 DIAGNOSIS — G44209 Tension-type headache, unspecified, not intractable: Secondary | ICD-10-CM

## 2020-01-09 NOTE — Patient Instructions (Addendum)
Your headache does not have a classic migrainous component in my opinion. Your headaches on a daily basis may be due to several contributing factors including tension type headache, medication induced headaches from taking daily nonsteroidal anti-inflammatory medication, caffeine overuse can also contribute to headaches.  Please have your primary care physician review your CPAP compliance data. Suboptimally treated obstructive sleep apnea can also contribute to recurrent headaches. As I understand, Dr. Denyse Amass used to monitor your CPAP compliance.  Please try to hydrate well with water, 6 to 8 cups/day are recommended typically. Please try to reduce your soda intake and try to limit yourself gradually to 1 or 2 servings per day. Please reduce your ibuprofen intake. I would recommend that you start taking the long-acting Topamax as prescribed by Dr. Ashley Royalty for now. I would like to go ahead and proceed with a brain MRI with and without contrast to rule out a structural cause of your symptoms. We will call you with your MRI results.  Please follow-up to see one of our nurse practitioners in 3 months. We will draw some blood work today to make sure your kidney and liver function are okay to proceed with the brain MRI with and without contrast.  Please also schedule your eye examination as you are due for your eye checkup.

## 2020-01-09 NOTE — Progress Notes (Signed)
Subjective:    Patient ID: Nathaniel [name] is a 54 y.o. male.  HPI     Star Age, MD, PhD [facility] [address].O. [place], [place] [address]  Dear Dr. Zigmund Daniel,   I saw your patient, Nathaniel [name], upon your kind request, in my neurologic Clinic today for initial consultation of his recurrent headaches.  The patient is unaccompanied today.  As you know, Nathaniel [name] is a 54 year old right-handed gentleman with an underlying medical history of reflux disease, diabetes, hypogonadism, depression, sleep apnea, cervical degenerative disc disease, and overweight state, who reports recurrent headaches for the past several months, probably 6 months. He has had in the past few months daily headaches. Headache started gradually are in the area above his ears, close to the temple or area, and bandlike or viselike, pressure-like, no throbbing or one-sided headaches, no significant photophobia. He has had some nausea. He has been taking over-the-counter ibuprofen, 4 to 6 pills daily. He has done so for the past several months. He recently received a prescription for long-acting topiramate but has not been able to pick it up yet as the pharmacy did not have it available. He had a prior history of intermittent headaches several years ago, some 7 or 8 years ago when he saw a neurologist at [facility]. He tried some medication but does not recall the names. He does not recall the name of the male neurologist he saw. He had a brain MRI at the time which as he recalls was normal. He has not had any accompanying neurological symptoms with his headaches such as one-sided weakness or numbness or tingling or droopy face or slurring of speech. He does not have a family history of headaches.   He does not drink a whole lot of water. He drinks diet Pepsi about 2 L/day. He does not drink any alcohol. He is a non-smoker. He is married and lives with his wife and his son. He  denies any recent stress, in fact, he feels that his stress level has been low.  He recently tried Nurtec for about 2 or 3 days but did not notice any improvement.  He uses his CPAP every night. He reports that he averages about 7 hours of sleep on it and that his average AHI is around 2/h. He used to follow with a sleep specialist but has not seen him and a few years, his previous primary care physician, Dr. Georgina Snell had monitored his CPAP compliance. I looked in the chart but could not find any compliance data scanned in or mentioned. He had an eye examination about a year ago. He is due for his eye appointment.  He has not had any blurry vision, no glare at night while driving, no loss of vision and reports that his prescription has been the same for the past 4 years.  He follows with endocrinology for his diabetes.   He had seen Dr. Posey Pronto at [facility] neurology several years ago for radiating neck pain and underwent injection treatment. He had a cervical spine MRI without contrast on 03/09/2013 and I reviewed the results: IMPRESSION:  1. The primary abnormality is at C5-6 where there is a broad-based  disc protrusion with apparent right foraminal and left paracentral  components. Detail is limited by motion on the axial images,  although there is evidence of mild cord flattening and right greater  than left foraminal stenosis.  2. Uncinate spurring and facet hypertrophy contribute to mild  foraminal narrowing at C3-4 and C4-5. No other focal disc protrusion  or cord deformity identified.    His Past Medical History Is Significant For: Past Medical History:  Diagnosis Date  . Depression   . Diabetes mellitus    type II  . GERD (gastroesophageal reflux disease)   . Headache(784.0)   . Hypogonadism male   . Sleep apnea     His Past Surgical History Is Significant For: Past Surgical History:  Procedure Laterality Date  . NASAL SINUS SURGERY  04/1991  . TONSILLECTOMY  1972    His  Family History Is Significant For: Family History  Adopted: Yes  Problem Relation Age of Onset  . Cancer Father 41       Prostate cancer  . Healthy Mother   . Diabetes Mellitus I Son     His Social History Is Significant For: Social History   Socioeconomic History  . Marital status: Married    Spouse name: [name]  . Number of children: 2  . Years of education: Not on file  . Highest education level: Not on file  Occupational History    Employer: [employer]    Comment: Mental Health Case Management  Tobacco Use  . Smoking status: Never Smoker  . Smokeless tobacco: Never Used  Vaping Use  . Vaping Use: Never used  Substance and Sexual Activity  . Alcohol use: No  . Drug use: No  . Sexual activity: Not on file  Other Topics Concern  . Not on file  Social History Narrative   He lives with wife and two children.   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   Highest level of education: master degree   Caffeine- diet soda 2 L a day   Social Determinants of Health   Financial Resource Strain:   . Difficulty of Paying Living Expenses: Not on file  Food Insecurity:   . Worried About Charity fundraiser in the Last Year: Not on file  . Ran Out of Food in the Last Year: Not on file  Transportation Needs:   . Lack of Transportation (Medical): Not on file  . Lack of Transportation (Non-Medical): Not on file  Physical Activity:   . Days of Exercise per Week: Not on file  . Minutes of Exercise per Session: Not on file  Stress:   . Feeling of Stress : Not on file  Social Connections:   . Frequency of Communication with Friends and Family: Not on file  . Frequency of Social Gatherings with Friends and Family: Not on file  . Attends Religious Services: Not on file  . Active Member of Clubs or Organizations: Not on file  . Attends Archivist Meetings: Not on file  . Marital Status: Not on file    His Allergies Are:   Allergies  Allergen Reactions  . Cymbalta [Duloxetine Hcl] Other (See Comments)    Suicidal thoughts  . Influenza Vaccines Other (See Comments)    Unknown reaction  . Penicillins Other (See Comments)    Unknown - childhood allergy per mother  . Ace Inhibitors Swelling    Angioedema with lisinopril  . Pioglitazone Other (See Comments)      headache  . Promethazine Hcl Nausea And Vomiting       . Reglan [Metoclopramide] Other (See Comments)    Jittery, increased anxiety  :   His Current Medications Are:  Outpatient Encounter Medications as of 01/09/2020  Medication Sig  .  Alcohol Swabs (ALCOHOL PREPS) PADS Use alcohol pads to clean site to be used to check blood sugar.  . AMBULATORY NON FORMULARY MEDICATION Change CPAP setting to 13.5 (14 is ok if 13.5 is not available). Please provide new machine as old machine was not fixable and Mr [name] has improved AHI with new machine.  . Blood Glucose Monitoring Suppl (CONTOUR NEXT MONITOR) w/Device KIT 1 Device by Does not apply route daily. Use to test blood sugar daily  . Continuous Blood Gluc Sensor (FREESTYLE LIBRE 14 DAY SENSOR) MISC Apply topically every 14 (fourteen) days.  Marland Kitchen FIASP 100 UNIT/ML SOLN SMARTSIG:70 Unit(s) SUB-Q Daily  . FLUoxetine (PROZAC) 40 MG capsule TAKE 1 CAPSULE BY MOUTH ONCE DAILY  . GLUCAGON EMERGENCY 1 MG injection INJECT 1 MG INTO THE MUSCLE ONCE AS NEEDED.  Marland Kitchen glucose blood (CONTOUR NEXT TEST) test strip Use as instructed to check blood sugar 5 times daily.  Marland Kitchen glucose blood test strip USE TO CHECK BLOOD SUGAR 5 TO 6 TIMES A DAY  . insulin lispro (HUMALOG) 100 UNIT/ML injection INJECT up to 70 units INTO THE SKIN DAILY-in the insulin pump  . Lancets (FREESTYLE) lancets Use as instructed to check sugar 4 times daily.  . metFORMIN (GLUCOPHAGE) 1000 MG tablet Take 2,000 mg by mouth daily.  . metFORMIN (GLUCOPHAGE-XR) 500 MG 24 hr tablet TAKE 4 TABLETS BY MOUTH DAILY in the morning  . MULTIPLE VITAMIN PO Take 1  tablet by mouth daily.  . ondansetron (ZOFRAN ODT) 4 MG disintegrating tablet Take 1 tablet (4 mg total) by mouth every 8 (eight) hours as needed for nausea or vomiting.  . pantoprazole (PROTONIX) 40 MG tablet Take 1 tablet (40 mg total) by mouth daily.  . Rimegepant Sulfate (NURTEC) 75 MG TBDP Take 1 tablet by mouth daily. (Patient not taking: Reported on 01/09/2020)  . Topiramate ER (TROKENDI XR) 25 MG CP24 Take 1 tablet daily x1 week then increase to 2 tabs daily. (Patient not taking: Reported on 01/09/2020)  . [DISCONTINUED] ALPRAZolam (XANAX) 0.5 MG tablet Take 1 tablet (0.5 mg total) by mouth daily as needed for anxiety.  . [DISCONTINUED] Ferrous Sulfate (IRON HIGH-POTENCY) 142 (45 Fe) MG TBCR Take 1 tablet by mouth daily.   No facility-administered encounter medications on file as of 01/09/2020.  :   Review of Systems:  Out of a complete 14 point review of systems, all are reviewed and negative with the exception of these symptoms as listed below:  Review of Systems  Neurological:       Rm 1 New patient here for headaches, increased in last 1-2 months. Tried Aleve, ibuprofen, sinus medicine, Nurtec. Headaches are daily for most of day, above and around my ears, can be intense.     Objective:  Neurological Exam  Physical Exam Physical Examination:   Vitals:   01/09/20 1122  BP: 119/70  Pulse: 72    General Examination: The patient is a very pleasant 54 y.o. male in no acute distress. He appears well-developed and well-nourished and well groomed. No photophobia.  HEENT: Normocephalic, atraumatic, pupils are equal, round and reactive to light and accommodation. Funduscopic exam is normal with sharp disc margins noted. Extraocular tracking is good without limitation to gaze excursion or nystagmus noted. Normal smooth pursuit is noted. Corrective eyeglasses in place. Hearing is grossly intact. Face is symmetric with normal facial animation and normal facial sensation. Speech is  clear with no dysarthria noted. There is no hypophonia. There is no lip, neck/head, jaw or  voice tremor. Neck is supple with full range of passive and active motion. There are no carotid bruits on auscultation. Oropharynx exam reveals: mild mouth dryness, adequate dental hygiene and moderate airway crowding. Tongue protrudes centrally and palate elevates symmetrically.   Chest: Clear to auscultation without wheezing, rhonchi or crackles noted.  Heart: S1+S2+0, regular and normal without murmurs, rubs or gallops noted.   Abdomen: Soft, non-tender and non-distended with normal bowel sounds appreciated on auscultation.  Extremities: There is no pitting edema in the distal lower extremities bilaterally.  Skin: Warm and dry without trophic changes noted.  Musculoskeletal: exam reveals no obvious joint deformities, tenderness or joint swelling or erythema.   Neurologically:  Mental status: The patient is awake, alert and oriented in all 4 spheres. His immediate and remote memory, attention, language skills and fund of knowledge are appropriate. There is no evidence of aphasia, agnosia, apraxia or anomia. Speech is clear with normal prosody and enunciation. Thought process is linear. Mood is normal and affect is normal.  Cranial nerves II - XII are as described above under HEENT exam. In addition: shoulder shrug is normal with equal shoulder height noted. Motor exam: Normal bulk, strength and tone is noted. There is no drift, tremor or rebound. Romberg is negative. Reflexes are 2+ throughout. Babinski: Toes are flexor bilaterally. Fine motor skills and coordination: intact with normal finger taps, normal hand movements, normal rapid alternating patting, normal foot taps and normal foot agility.  Cerebellar testing: No dysmetria or intention tremor on finger to nose testing. Heel to shin is unremarkable bilaterally. There is no truncal or gait ataxia.  Sensory exam: intact to light touch in the upper and  lower extremities.  Gait, station and balance: He stands easily. No veering to one side is noted. No leaning to one side is noted. Posture is age-appropriate and stance is narrow based. Gait shows normal stride length and normal pace. No problems turning are noted. Tandem walk is unremarkable.   Assessment and Plan:  In summary, Nathaniel [name] is a very pleasant 54 y.o.-year old male with an underlying medical history of reflux disease, diabetes, hypogonadism, depression, sleep apnea, cervical degenerative disc disease, and overweight state, who presents for evaluation of his recurrent headaches of several months duration. Description of the headache symptomatology is not classic for migraine headaches. More likely, he may have tension headaches with a combination of other contributors including medication overuse component, caffeine overuse component, to a lesser degree of migrainous component. He has a yearly eye examination but is due around this time. He is encouraged to make his eye appointment. He has obstructive sleep apnea but a recent compliance download was not available in the chart. He reports that his AHI is typically around 2/h. An actual compliance data review may be helpful. He is advised to not utilize over-the-counter nonsteroidal medication on a day-to-day basis, he takes about 4 to 6 pills every day. He also reports that his stomach has been affected by this and he has been on Protonix consistently. He is encouraged to increase his water intake to about 6 to 8 cups of water per day and reduce his caffeine intake in the form of soda to limit himself to about 1 or 2 servings per day. His neurological exam is normal thankfully. Nevertheless, given the headache onset and significant frequency of headaches I would recommend we proceed with a brain MRI with and without contrast. He did not have any recent kidney function labs on file and was  advised to proceed with blood work today. He is advised  to start a trial of long-acting topiramate as prescribed by you, he has not been able to fill the prescription yet at his pharmacy as they did not have it yet. He is advised to follow-up in our clinic to see one of our nurse practitioners in 3 months, sooner if needed. We will call him to schedule his brain MRI and also with the results in the interim. I answered all his questions today and he was in agreement with the plan. Unfortunately, before I could physically handed him his printed AVS and show him where to go for his blood work he indicated to one of our staff members that he has to leave to go to work and that he would get his blood work done another time and that he would get his AVS through [facility].   Thank you very much for allowing me to participate in the care of this nice patient. If I can be of any further assistance to you please do not hesitate to call me at [phone].  Sincerely,   Star Age, MD, PhD

## 2020-01-13 ENCOUNTER — Telehealth: Payer: Self-pay | Admitting: Neurology

## 2020-01-13 ENCOUNTER — Encounter: Payer: Self-pay | Admitting: Neurology

## 2020-01-13 NOTE — Telephone Encounter (Signed)
LVM for pt to call back about scheduling mri [facility] auth: NPR Ref # [id number]

## 2020-01-13 NOTE — Telephone Encounter (Signed)
Patient does not want to get the MRI done at this time via mychart message and he left a voicemail on my phone.

## 2020-01-14 ENCOUNTER — Encounter: Payer: Self-pay | Admitting: Family Medicine

## 2020-01-14 ENCOUNTER — Encounter: Payer: Self-pay | Admitting: Neurology

## 2020-01-14 ENCOUNTER — Telehealth: Payer: Self-pay | Admitting: Neurology

## 2020-01-14 NOTE — Telephone Encounter (Signed)
Would you call patient for clarification?  I am a little confused by the messages.  It looks to me that he wished to have no follow-up appointment here.  Follow-up appointment with Amy Lomax, was canceled.  I cannot prescribe another medication and him not follow-up with [place].  Please clarify if he would like to reestablish the follow-up appointment with the nurse practitioner.  Did he get a chance to read my AVS as he did not get a paper copy before he left?  We discussed several interim measures for reducing headaches over time.  If he would like to proceed with another medication trial, we can call in a low-dose prescription for amitriptyline, 25 mg strength half a pill each night with gradual increase to up to 2 pills each night with weekly increase.   Common side effects reported are: mouth dryness, drowsiness, confusion, dizziness.  You can put in a prescription after you talk to him but he does need a follow-up appointment.

## 2020-01-14 NOTE — Telephone Encounter (Signed)
I would like to get a better feel for what medications he has tried before.  He was not able to recall any medications that he tried in the past with another neurologist.  Please try to get more information, we will need release of information so we can get medical records from his prior neurologist as far as medications tried and failed.  He did not tell me he was on amitriptyline or nortriptyline in the past.  Lets work on lifestyle modification in the meantime and try to get records including his MRI results from his previous neurologist.  Please ask him to sign a release of information form when convenient for him. He was also working on getting me the information from his CPAP compliance download, as suboptimal treatment of his sleep apnea can also contribute to his headaches.  Has he been able to find his CPAP compliance download?

## 2020-01-14 NOTE — Telephone Encounter (Signed)
I called pt to discuss. No answer, left a message asking him to call me back. 

## 2020-01-14 NOTE — Telephone Encounter (Signed)
I called pt. I discussed Dr. Teofilo Pod recommendations with him. Pt is going to find out who his previous neurologist was and ask that office to fax [place] those records. He is also going to contact [facility] and ask them to fax his [place] his cpap compliance report. We will look out for these records.

## 2020-01-14 NOTE — Telephone Encounter (Signed)
Pt returned my call. I discussed Dr. Teofilo Pod recommendations with him. He is agreeable to a follow up with our office but still declines an MRI. He has been working on the lifestyle modifications as discussed in the AVS but his headaches persist. A follow up appt was made with Amy, NP on 04/20/20 at 7:30am. Pt verbalized understanding of new appt date and time.  Pt reports that he has tried amitriptyline and nortriptyline in the past and these were both ineffective. He is wondering if there is an alternative.

## 2020-01-15 MED FILL — FREESTYLE LIBRE 14 DAY SENS: 84 days supply | Qty: 6 | Fill #2

## 2020-01-20 ENCOUNTER — Encounter: Payer: Self-pay | Admitting: Neurology

## 2020-01-27 ENCOUNTER — Telehealth: Payer: Self-pay | Admitting: Neurology

## 2020-01-27 ENCOUNTER — Other Ambulatory Visit: Payer: Self-pay | Admitting: *Deleted

## 2020-01-27 ENCOUNTER — Encounter: Payer: Self-pay | Admitting: Neurology

## 2020-01-27 MED ORDER — GABAPENTIN 100 MG PO CAPS: ORAL_CAPSULE | ORAL | 2 refills | Status: DC

## 2020-01-27 NOTE — Patient Instructions (Signed)

## 2020-01-27 NOTE — Telephone Encounter (Signed)
Received records from Dr. Lewis Moccasin office. Have not received records from [facility] yet.

## 2020-01-27 NOTE — Telephone Encounter (Signed)
Noted  

## 2020-01-27 NOTE — Telephone Encounter (Signed)
I was able to review neurological records from when patient saw Dr. Alton Revere, including an office visit from 04/05/2012.  The patient was on low-dose Topamax at the time and reported some relief of his headaches.  The note also reports a brain MRI from 03/05/2012 without contrast, impression: This is a normal MRI of the brain without contrast.  The MRI was interpreted by Dr. Despina Arias.  He was on tramadol as needed at the time and Topamax was increased.  He was advised to see psychiatry for depression.  An earlier visit from 03/08/2012 was also reviewed.  He was on nortriptyline which increased his blood sugar.  He had tried desipramine prior to the nortriptyline.  Nortriptyline also caused him to feel sleepy.  His headaches were improved on nortriptyline.  Please call patient and advise him that I would like to proceed with a brain MRI with and without contrast as it has been several years.  His previous MRI from 2013 was without contrast.   For headache management I suggest a trial of low-dose gabapentin starting at 100 mg at bedtime with gradual increase.  I will place a prescription.

## 2020-01-27 NOTE — Telephone Encounter (Signed)
CPAP compliance was good. Will review neuro records.

## 2020-01-27 NOTE — Telephone Encounter (Signed)
Pt responded via mychart message and would like to proceed with MRI.

## 2020-01-27 NOTE — Telephone Encounter (Signed)
Responded to pt via mychart

## 2020-01-28 ENCOUNTER — Encounter: Payer: Self-pay | Admitting: Neurology

## 2020-01-28 ENCOUNTER — Telehealth: Payer: Self-pay | Admitting: Neurology

## 2020-01-28 NOTE — Telephone Encounter (Signed)
I see that patient requested to cancel MRI and FU appt.  Please advise patient, that at this point I recommend he consult with a headache specialist at an academic center. You may email him back too.  I would encourage him to speak with his PCP about a referral to a comprehensive headache center, such as [facility] or academic [facility] center.

## 2020-01-28 NOTE — Telephone Encounter (Signed)
Sent pt a mychart message with this information.

## 2020-01-28 NOTE — Addendum Note (Signed)
Addended by: Geronimo Running A on: 01/28/2020 08:26 AM   Modules accepted: Orders

## 2020-01-28 NOTE — Telephone Encounter (Signed)
I spoke with the patient he states he wants to wait and did not want to schedule it at this time. I informed him to give [place] a call when he is ready.

## 2020-01-28 NOTE — Telephone Encounter (Signed)
Pt sent a mychart message asking [place] to cancel his appt, MRI, and labs. This has been completed.

## 2020-02-17 ENCOUNTER — Other Ambulatory Visit: Payer: Self-pay

## 2020-02-17 ENCOUNTER — Encounter: Payer: Self-pay | Admitting: Emergency Medicine

## 2020-02-17 ENCOUNTER — Emergency Department
Admission: EM | Admit: 2020-02-17 | Discharge: 2020-02-17 | Disposition: A | Discharge status: Home / Self Care | Payer: 59 | Source: Home / Self Care

## 2020-02-17 ENCOUNTER — Emergency Department (INDEPENDENT_AMBULATORY_CARE_PROVIDER_SITE_OTHER): Payer: 59

## 2020-02-17 DIAGNOSIS — S99922A Unspecified injury of left foot, initial encounter: Secondary | ICD-10-CM

## 2020-02-17 DIAGNOSIS — S92155A Nondisplaced avulsion fracture (chip fracture) of left talus, initial encounter for closed fracture: Secondary | ICD-10-CM

## 2020-02-17 DIAGNOSIS — W109XXA Fall (on) (from) unspecified stairs and steps, initial encounter: Secondary | ICD-10-CM

## 2020-02-17 DIAGNOSIS — M7989 Other specified soft tissue disorders: Secondary | ICD-10-CM | POA: Diagnosis not present

## 2020-02-17 DIAGNOSIS — M79672 Pain in left foot: Secondary | ICD-10-CM

## 2020-02-17 DIAGNOSIS — M25572 Pain in left ankle and joints of left foot: Secondary | ICD-10-CM | POA: Diagnosis not present

## 2020-02-17 DIAGNOSIS — R2242 Localized swelling, mass and lump, left lower limb: Secondary | ICD-10-CM

## 2020-02-17 DIAGNOSIS — M7732 Calcaneal spur, left foot: Secondary | ICD-10-CM | POA: Diagnosis not present

## 2020-02-17 NOTE — ED Provider Notes (Signed)
[name]    CSN: [id number] Arrival date & time: 02/17/20  0813      History   Chief Complaint Chief Complaint  Patient presents with  . Fall    HPI Nathaniel [name] is a 54 y.o. male.   HPI  Nathaniel [name] is a 54 y.o. male presenting to UC with c/o Left ankle pain and swelling after rolling his ankle last night and falling down stairs. Denies other injuries during the fall. Pain is aching and sore, 10/10 with weightbearing. No prior fracture or surgery to same ankle.    Past Medical History:  Diagnosis Date  . Depression   . Diabetes mellitus    type II  . GERD (gastroesophageal reflux disease)   . Headache(784.0)   . Hypogonadism male   . Sleep apnea     Patient Active Problem List   Diagnosis Date Noted  . Diarrhea 12/26/2019  . GERD (gastroesophageal reflux disease) 11/21/2019  . Scrotal rash 01/25/2019  . Pain in right knee 08/07/2018  . Obstructive sleep apnea 06/09/2016  . Type 1 diabetes mellitus, uncontrolled ([place]) 10/02/2015  . Cervical radiculitis 01/25/2015  . Neck pain 02/27/2013  . Chronic headaches 09/28/2011  . Seasonal and perennial allergic rhinitis 09/28/2011  . Fatigue 12/16/2008    Past Surgical History:  Procedure Laterality Date  . NASAL SINUS SURGERY  04/1991  . TONSILLECTOMY  1972       Home Medications    Prior to Admission medications   Medication Sig Start Date End Date Taking? Authorizing Provider  Alcohol Swabs (ALCOHOL PREPS) PADS Use alcohol pads to clean site to be used to check blood sugar. 05/02/14   Philemon Kingdom, MD  AMBULATORY NON FORMULARY MEDICATION Change CPAP setting to 13.5 (14 is ok if 13.5 is not available). Please provide new machine as old machine was not fixable and Mr Stauffer has improved AHI with new machine. 07/19/18   Gregor Hams, MD  Blood Glucose Monitoring Suppl (CONTOUR NEXT MONITOR) w/Device KIT 1 Device by Does not apply route daily. Use to test blood sugar daily 04/03/18    Philemon Kingdom, MD  Continuous Blood Gluc Sensor (FREESTYLE LIBRE 14 DAY SENSOR) MISC Apply topically every 14 (fourteen) days. 10/28/19   [provider]  FIASP 100 UNIT/ML SOLN SMARTSIG:70 Unit(s) SUB-Q Daily 07/09/19   [provider]  FLUoxetine (PROZAC) 40 MG capsule TAKE 1 CAPSULE BY MOUTH ONCE DAILY 01/08/20   Luetta Nutting, DO  GLUCAGON EMERGENCY 1 MG injection INJECT 1 MG INTO THE MUSCLE ONCE AS NEEDED. 06/23/17   Philemon Kingdom, MD  glucose blood (CONTOUR NEXT TEST) test strip Use as instructed to check blood sugar 5 times daily. 09/12/17   Philemon Kingdom, MD  glucose blood test strip USE TO CHECK BLOOD SUGAR 5 TO 6 TIMES A DAY 07/23/19   [provider]  insulin lispro (HUMALOG) 100 UNIT/ML injection INJECT up to 70 units INTO THE SKIN DAILY-in the insulin pump 08/10/18   Philemon Kingdom, MD  Lancets (FREESTYLE) lancets Use as instructed to check sugar 4 times daily. 04/27/16   Philemon Kingdom, MD  metFORMIN (GLUCOPHAGE) 1000 MG tablet Take 2,000 mg by mouth daily. 11/19/19   [provider]  metFORMIN (GLUCOPHAGE-XR) 500 MG 24 hr tablet TAKE 4 TABLETS BY MOUTH DAILY in the morning 01/04/18   Philemon Kingdom, MD  MULTIPLE VITAMIN PO Take 1 tablet by mouth daily.    [provider]  ondansetron (ZOFRAN ODT) 4 MG disintegrating  tablet Take 1 tablet (4 mg total) by mouth every 8 (eight) hours as needed for nausea or vomiting. 08/07/19   Lamptey, Myrene Galas, MD  pantoprazole (PROTONIX) 40 MG tablet Take 1 tablet (40 mg total) by mouth daily. 11/21/19   Luetta Nutting, DO  ALPRAZolam Duanne Moron) 0.5 MG tablet Take 1 tablet (0.5 mg total) by mouth daily as needed for anxiety. 02/11/19 03/26/19  Silverio Decamp, MD    Family History Family History  Adopted: Yes  Problem Relation Age of Onset  . Cancer Father 27       Prostate cancer  . Healthy Mother   . Diabetes Mellitus I Son     Social History Social History   Tobacco Use  . Smoking  status: Never Smoker  . Smokeless tobacco: Never Used  Vaping Use  . Vaping Use: Never used  Substance Use Topics  . Alcohol use: No  . Drug use: No     Allergies   Cymbalta [duloxetine hcl], Influenza vaccines, Penicillins, Ace inhibitors, Pioglitazone, Promethazine hcl, and Reglan [metoclopramide]   Review of Systems Review of Systems  Musculoskeletal: Positive for arthralgias, gait problem and joint swelling.  Skin: Negative for color change and wound.     Physical Exam Triage Vital Signs ED Triage Vitals  Enc Vitals Group     BP 02/17/20 0826 (!) 149/79     Pulse Rate 02/17/20 0826 94     Resp --      Temp 02/17/20 0826 97.8 F (36.6 C)     Temp Source 02/17/20 0826 Oral     SpO2 02/17/20 0826 100 %     Weight 02/17/20 0827 229 lb (103.9 kg)     Height 02/17/20 0827 6' (1.829 m)     Head Circumference --      Peak Flow --      Pain Score 02/17/20 0826 10     Pain Loc --      Pain Edu? --      Excl. in [place]? --    No data found.  Updated Vital Signs BP (!) 149/79 (BP Location: Right Arm)   Pulse 94   Temp 97.8 F (36.6 C) (Oral)   Ht 6' (1.829 m)   Wt 229 lb (103.9 kg)   SpO2 100%   BMI 31.06 kg/m   Visual Acuity Right Eye Distance:   Left Eye Distance:   Bilateral Distance:    Right Eye Near:   Left Eye Near:    Bilateral Near:     Physical Exam Vitals and nursing note reviewed.  Constitutional:      Appearance: Normal appearance. He is well-developed.  HENT:     Head: Normocephalic and atraumatic.  Cardiovascular:     Rate and Rhythm: Normal rate and regular rhythm.     Pulses:          Dorsalis pedis pulses are 2+ on the left side.       Posterior tibial pulses are 2+ on the left side.  Pulmonary:     Effort: Pulmonary effort is normal.  Musculoskeletal:        General: Swelling and tenderness present. Normal range of motion.     Cervical back: Normal range of motion.     Comments: Left ankle: mild diffuse edema and tenderness. Full  ROM, increased pain with dorsiflexion. Mild tenderness of foot Calf is soft, non-tender.   Skin:    General: Skin is warm and dry.     Capillary  Refill: Capillary refill takes less than 2 seconds.     Findings: No bruising or erythema.  Neurological:     Mental Status: He is alert and oriented to person, place, and time.     Sensory: No sensory deficit.  Psychiatric:        Behavior: Behavior normal.      UC Treatments / Results  Labs (all labs ordered are listed, but only abnormal results are displayed) Labs Reviewed - No data to display  EKG   Radiology DG Ankle Complete Left  Result Date: 02/17/2020 CLINICAL DATA:  [place] down stairs yesterday injuring the left ankle. Pain and swelling. EXAM: LEFT ANKLE COMPLETE - 3+ VIEW COMPARISON:  None. FINDINGS: Tiny bone densities project between distal fibula and adjacent talus suggesting minor avulsion fractures. No other evidence of a fracture. Ankle joint is normally spaced and aligned. There is soft tissue swelling which predominates laterally. IMPRESSION: 1. Possible tiny avulsion fractures laterally, suspected to be from the talus at the insertion of the anterior talofibular ligament. 2. No other evidence of a fracture normally aligned ankle joint. 3. Predominantly lateral soft tissue swelling. Electronically Signed   By: Lajean Manes M.D.   On: 02/17/2020 09:12   DG Foot Complete Left  Result Date: 02/17/2020 CLINICAL DATA:  Left foot injury, initial encounter. Fell down stairs. EXAM: LEFT FOOT - COMPLETE 3+ VIEW COMPARISON:  Left ankle 02/17/2020 FINDINGS: Negative for fracture of dislocation in left foot. Small plantar calcaneal spur. Alignment of left foot is normal. No focal soft tissue abnormality in left foot. IMPRESSION: No acute bone abnormality to the left foot. Electronically Signed   By: Markus Daft M.D.   On: 02/17/2020 09:13    Procedures Procedures (including critical care time)  Medications Ordered in  UC Medications - No data to display  Initial Impression / Assessment and Plan / UC Course  I have reviewed the triage vital signs and the nursing notes.  Pertinent labs & imaging results that were available during my care of the patient were reviewed by me and considered in my medical decision making (see chart for details).     Discussed imaging with pt Pt placed in short boot, has his own crutches F/u with Sports Medicine in 1-2 weeks for ongoing care of fracture AVS given  Final Clinical Impressions(s) / UC Diagnoses   Final diagnoses:  Foot injury, left, initial encounter  Closed nondisplaced avulsion fracture of left talus, initial encounter     Discharge Instructions      You may wear the boot and use your crutches to help keep weight off your Left ankle and foot as you are healing.  Call to schedule a follow up appointment with Sports Medicine in 1-2 weeks for recheck of symptoms.     ED Prescriptions    None     PDMP not reviewed this encounter.   Noe Gens, [place] 02/17/20 2046

## 2020-02-17 NOTE — Discharge Instructions (Signed)
  You may wear the boot and use your crutches to help keep weight off your Left ankle and foot as you are healing.  Call to schedule a follow up appointment with Sports Medicine in 1-2 weeks for recheck of symptoms.

## 2020-02-17 NOTE — ED Triage Notes (Signed)
Left ankle injury, fell down stairs yesterday. Vaccinated

## 2020-02-18 ENCOUNTER — Telehealth: Payer: Self-pay | Admitting: Emergency Medicine

## 2020-02-18 NOTE — Telephone Encounter (Signed)
Message left for [name] to follow up w/ sports medicine doctor for further pain control. Sports medicine doctor information listed on AVS. Spoke with provider here today prior to calling patient.

## 2020-02-21 ENCOUNTER — Telehealth: Payer: Self-pay

## 2020-02-21 NOTE — Telephone Encounter (Signed)
-----   Message from Carlus Pavlov, MD sent at 02/19/2020 12:26 PM EST ----- Regarding: RE: Re- establish I can see him after the Holidays - need 40 min for him. Ty, C ----- Message ----- From: Lauralee Evener Sent: 02/17/2020   4:35 PM EST To: Carlus Pavlov, MD Subject: Re- establish                                  Mr. Topper called requesting to re-established care with Dr. Elvera Lennox. Per notation in the chart pt chose a different endocrinologist a year ago. Please advise.

## 2020-02-21 NOTE — Telephone Encounter (Signed)
Called pt Nathaniel [name] to schedule appointment with Dr. Elvera Lennox in January 2022 for 40 minutes.

## 2020-02-24 ENCOUNTER — Encounter: Payer: Self-pay | Admitting: Family Medicine

## 2020-02-24 ENCOUNTER — Ambulatory Visit (INDEPENDENT_AMBULATORY_CARE_PROVIDER_SITE_OTHER): Payer: 59 | Admitting: Family Medicine

## 2020-02-24 ENCOUNTER — Other Ambulatory Visit: Payer: Self-pay

## 2020-02-24 VITALS — BP 122/82 | Ht 72.0 in | Wt 220.0 lb

## 2020-02-24 DIAGNOSIS — S90129A Contusion of unspecified lesser toe(s) without damage to nail, initial encounter: Secondary | ICD-10-CM

## 2020-02-24 DIAGNOSIS — S82892A Other fracture of left lower leg, initial encounter for closed fracture: Secondary | ICD-10-CM

## 2020-02-24 HISTORY — DX: Other fracture of left lower leg, initial encounter for closed fracture: S82.892A

## 2020-02-24 NOTE — Progress Notes (Signed)
    SUBJECTIVE:   CHIEF COMPLAINT / HPI:   He was seen at the urgent care on 11/29 after twisting his ankle and falling down some stairs.  Endorses lateral ankle pain with swelling.  He states he did not try to put weight on it and used some crutches from his house to go to the urgent care.  From there he was placed in a hard boot.  He has not tried putting weight on it since that time. Pt states he has been taking ibuprofen and tylenol but these do not help with the pain.  He iced it for 'about 5 hours' the day he injured but has not iced it since.  He elevates it when resting.    A few days after his ankle injury he stubbed his right 4th toe against his crutches and this immediately caused pain.  He has been able to bear weight on it since that time.  It is still painful.     PERTINENT  PMH / PSH: DM1  OBJECTIVE:   BP 122/82   Ht 6' (1.829 m)   Wt 220 lb (99.8 kg)   BMI 29.84 kg/m   Gen: alert, oriented.   MSK:  Left foot: swelling of the ankle anterolaterally.  TTP anterior to the lateral malleolus.  Slightly tender in the posterior lateral mallelous and distal to the ankle.  Range of motion of the left ankle slightly decreased due to pain.  Normal range of motion in the toes.  Bruising w/o tenderness over lateral calcaneus.  Deferred ant drawer and talar tilt Right foot:  Bruising of the 4th toe just proximal to the nail bed.  Full range of motion.  TTP at the site of bruising.  No displacement or malalignment of the toe.    ASSESSMENT/PLAN:   Closed avulsion fracture of left ankle Pt on crutches and in boot.  Radiographs reviewed and noted small avulsion fracture of ATFL off talus.  Continue boot, transition off crutches.  Plan to hopefully transition to ASO from boot at f/u in 2 weeks.  Advised pt to use naproxen 500mg  BID and tylenol 1000mg  TID.  Educated pt on ROM exercises.  Apply ice for 4-5 times a day.   Right 4th toe injury No malrotation or angulation.  Offered  radiographs but would not change management - deferred for now.  Tylenol, naproxen.  , MD [facility]

## 2020-02-24 NOTE — Assessment & Plan Note (Signed)
Non displaced.  Normal range of motion.  Mild bruising with moderate tenderness.  Taking nsaids and tylenol for pain of left ankle.  Unlikely to be fractured and pt declined further imaging.  F/u in two weeks.

## 2020-02-24 NOTE — Patient Instructions (Addendum)
It was nice to meet you today,  When you sprained your ankle you had what is called an avulsion fracture which is when the ligament separates from the bone it takes a tiny piece of bone with it.  This will heal in time.  While you are waiting for it to heal, I would like you to do the following:  -We have given you some exercises to do for range of motion. -I would like you to ice the area to reduce swelling for 15 minutes at a time for 4-5 times. -Try to transition off the crutches while staying in the hard boot as tolerated. -For pain relief try taking naproxen/Aleve.  You can take up to 500 mg twice a day until you see [place] again.  For your Tylenol, you can take up to 1000 mg 3 times a day. -Please follow-up again in 2 weeks with [place].    For your toe, either is nothing you need to do, but if the pain increases to the point where you cannot put weight on it please let us know.  We will reevaluate again when we see you next time.  Have a great day,  Frederic Jericho, MD

## 2020-02-24 NOTE — Assessment & Plan Note (Addendum)
Pt on crutches and in hard boot.  still having tenderness and pain in the ankle. Physical exam significant for some mild swelling and bruising.   Will not repeat imaging at this time.  Will have pt follow up in 2 weeks.  Advised pt to use naproxen 500mg  BID and tylenol 1000mg  TID.  Educated pt on stretching/ROM exercises.  Apply ice for 4-5 times a day.   Advised him to bear weight as tolerated.  Will discuss switching from hard boot at next visit.

## 2020-02-27 ENCOUNTER — Encounter: Payer: Self-pay | Admitting: Family Medicine

## 2020-02-27 ENCOUNTER — Ambulatory Visit: Payer: 59 | Admitting: Sports Medicine

## 2020-02-27 ENCOUNTER — Other Ambulatory Visit: Payer: Self-pay | Admitting: Family Medicine

## 2020-02-27 DIAGNOSIS — G4733 Obstructive sleep apnea (adult) (pediatric): Secondary | ICD-10-CM

## 2020-03-02 ENCOUNTER — Ambulatory Visit: Payer: 59 | Admitting: Psychologist

## 2020-03-02 MED FILL — METFORMIN HCL 1000 MG TABS: 1000 | 90 days supply | Qty: 180 | Fill #1

## 2020-03-06 ENCOUNTER — Other Ambulatory Visit: Payer: Self-pay

## 2020-03-06 ENCOUNTER — Ambulatory Visit: Payer: 59 | Admitting: Internal Medicine

## 2020-03-06 ENCOUNTER — Ambulatory Visit: Payer: 59 | Admitting: Neurology

## 2020-03-06 ENCOUNTER — Other Ambulatory Visit: Payer: Self-pay | Admitting: Internal Medicine

## 2020-03-06 ENCOUNTER — Encounter: Payer: Self-pay | Admitting: Internal Medicine

## 2020-03-06 VITALS — BP 120/82 | HR 69 | Ht 72.0 in | Wt 220.0 lb

## 2020-03-06 DIAGNOSIS — Z23 Encounter for immunization: Secondary | ICD-10-CM

## 2020-03-06 DIAGNOSIS — E1065 Type 1 diabetes mellitus with hyperglycemia: Secondary | ICD-10-CM

## 2020-03-06 LAB — POCT GLYCOSYLATED HEMOGLOBIN (HGB A1C): Hemoglobin A1C: 8.8 % — AB (ref 4.0–5.6)

## 2020-03-06 MED ORDER — GLUCAGON 3 MG/DOSE NA POWD: 3.0000 mg | Freq: Once | NASAL | 11 refills | Status: DC | PRN

## 2020-03-06 MED ORDER — LYUMJEV 100 UNIT/ML IJ SOLN: INTRAMUSCULAR | 3 refills | Status: DC

## 2020-03-06 MED FILL — BAQSIMI TWO PACK 3 MG/DOSE: 3 | 2 days supply | Qty: 2 | Fill #0

## 2020-03-06 MED FILL — LYUMJEV 100 UNIT/ML SOLN: 100 | 30 days supply | Qty: 30 | Fill #0

## 2020-03-06 NOTE — Patient Instructions (Addendum)
Please continue: - Metformin 2000 mg in a.m.  Please change the pump settings: - basal rates: 12 am: 1.0 >> 1.2 units/h 6 am: 1.3 - ICR:   12 am: 1:6  4 pm: 1:4.5 10 pm: 1:7 - target: 90-120 - ISF:   12 am: 24  9 pm: 24 - Insulin on Board: 4h  Please enter 50% of the protein amount as carbs if you have a low carb meal.  Try to change from Humalog to Lyumjev - start boluses at the start of the meal.  Please return in 3 months.

## 2020-03-06 NOTE — Progress Notes (Signed)
Patient ID: Nathaniel [name], male   DOB: January 21, 1966, 54 y.o.   MRN: [id number]  This visit occurred during the SARS-CoV-2 public health emergency.  Safety protocols were in place, including screening questions prior to the visit, additional usage of staff PPE, and extensive cleaning of exam room while observing appropriate contact time as indicated for disinfecting solutions.   HPI: BENNO BRENSINGER is a 54 y.o.-year-old man, presenting for f/u for DM1, dx as DM2 in 2001, and as DM1 in 07/2017, insulin-dependent since 2010, uncontrolled, without long-term complications. Last visit 07/2018 (virtual). He saw another provider (Dr. Garnet Koyanagi) since last visit, but decided to return to see me.  He had an ankle fracture 02/17/2020.  He has a boot on.  He is working from home now.  This allows him more flexibility with the diabetes control.  Reviewed HbA1c levels: 10/2019: HbA1c 7.6%  Lab Results  Component Value Date   HGBA1C 8.2 04/05/2019   HGBA1C 9.3 09/26/2018   HGBA1C 8.9 (A) 04/25/2018  11/08/2013: 9.5% 04/11/2013: 9.8%  Previously on: - Metformin 2000 mg daily in am. - Lantus 12 >> 14 units in am and 10 >> 14 units at bedtime >> Tresiba  - Humalog: 7-9 units depending on the size of the meal Inject Humalog 10-15 minutes before a meal. He was on Januvia which we stopped summer 2019. Stopped Tanzeum b/c N/V >> was in UC x2. Trulicity denied by insurance until he tried Victoza >> he did not want to try this.   Insulin pump:  -Medtronic 630G 07/2017 -At last visit, he was interested to switch to tandem T slim pump, but he was advised that he could only do so after 01/2019 -now Medtronic 770G 03/2019  CGM: -Freestyle libre  Insulin: -Humalog  Supplies: -Medtronic  He is now using his arm for infusion sets due to scar tissue on the abdomen. He tried to use his thighs, but this did not work well for him.  Pump settings: - basal rates: 12 am: 1.0 units/h 6 pm: 1.3 - ICR:   12  am: 1:6  4 pm: 1:4.5 10 pm: 1:7 - target: 110-110 >> 90-120 - ISF:   12 am: 30 >> 24  9 pm: 45 >> 24 - Insulin on Board: 4h  TDD from basal insulin: 45% (22.4 units) >> 45% (30 units) >> 50% TDD from bolus insulin: 55% (27.26 units) >> 55% (35 units) >> 50% TDD: 60-100 units - changes infusion site: Every 3 days - Meter: Bayer Contour   She is also on: - Metformin ER 2000 mg in a.m.  Pt.checks his sugars more than 4 times a day with his CGM.  We could not download this today as the website was down.    Previously:  Lowest: 41 at night >> .Marland KitchenMarland Kitchen75 (after exercise) >> 50s during the night. Highest: 360 >> 400 >> 320.  In the past, his sugars are dropping after exercise and he was disconnecting the pump during exercise.  Pt's meals are: - Breakfast: cereal + 1% milk, with protein bar >> sometimes scrambled eggs or cereals - Lunch: sandwich - Dinner: varies - Snacks: 1-2 a day  -No CKD, BUN/creatinine: Lab Results  Component Value Date   BUN 9 04/06/2019   CREATININE 1.0 04/06/2019   Normal GFR: Lab Results  Component Value Date   GFRNONAA 70 10/22/2018   GFRNONAA >60 06/30/2017   GFRNONAA 76 11/18/2016   GFRNONAA 87 01/15/2016   GFRNONAA 83 10/13/2015   GFRNONAA 87 08/14/2015  GFRNONAA >90 05/20/2013   No MAU: Lab Results  Component Value Date   MICRALBCREAT 2.3 01/04/2018   MICRALBCREAT 1.3 11/18/2016   MICRALBCREAT 4.1 06/06/2014   MICRALBCREAT 5.3 12/19/2012   MICRALBCREAT 2.8 02/23/2011   MICRALBCREAT 10.2 05/28/2010   MICRALBCREAT 3.4 12/18/2008  On lisinopril.  -No HL; last set of lipids: Lab Results  Component Value Date   CHOL 123 04/05/2019   HDL 59 04/05/2019   LDLCALC 51 04/05/2019   LDLDIRECT 78 10/22/2018   TRIG 57 04/05/2019   CHOLHDL 2 01/04/2018   - last eye exam was 2020: No DR reportedly - He denies numbness and tingling in his feet.    He has OSA and uses a CPAP machine.  Reviewed latest TSH: Lab Results  Component Value  Date   TSH 1.95 06/13/2019   ROS: Constitutional: no weight gain/no weight loss, + fatigue, no subjective hyperthermia, no subjective hypothermia Eyes: no blurry vision, no xerophthalmia ENT: no sore throat, no nodules palpated in neck, no dysphagia, no odynophagia, no hoarseness Cardiovascular: no CP/no SOB/no palpitations/no leg swelling Respiratory: no cough/no SOB/no wheezing Gastrointestinal: no N/no V/no D/no C, + acid reflux Musculoskeletal: no muscle aches/no joint aches Skin: no rashes, + hair loss Neurological: no tremors/no numbness/no tingling/no dizziness + depression  I reviewed pt's medications, allergies, PMH, social hx, family hx, and changes were documented in the history of present illness. Otherwise, unchanged from my initial visit note.  Past Medical History:  Diagnosis Date  . Depression   . Diabetes mellitus    type II  . GERD (gastroesophageal reflux disease)   . Headache(784.0)   . Hypogonadism male   . Sleep apnea    Past Surgical History:  Procedure Laterality Date  . NASAL SINUS SURGERY  04/1991  . TONSILLECTOMY  1972   Social History   Socioeconomic History  . Marital status: Married    Spouse name: [name]  . Number of children: 2  . Years of education: Not on file  . Highest education level: Not on file  Occupational History    Employer: [employer]    Comment: Mental Health Case Management  Tobacco Use  . Smoking status: Never Smoker  . Smokeless tobacco: Never Used  Vaping Use  . Vaping Use: Never used  Substance and Sexual Activity  . Alcohol use: No  . Drug use: No  . Sexual activity: Not on file  Other Topics Concern  . Not on file  Social History Narrative   He lives with wife and two children.   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   Highest level of education: master degree   Caffeine- diet soda 2 L a day   Social Determinants of Health   Financial Resource Strain: Not on  file  Food Insecurity: Not on file  Transportation Needs: Not on file  Physical Activity: Not on file  Stress: Not on file  Social Connections: Not on file  Intimate Partner Violence: Not on file   Current Outpatient Medications on File Prior to Visit  Medication Sig Dispense Refill  . Alcohol Swabs (ALCOHOL PREPS) PADS Use alcohol pads to clean site to be used to check blood sugar. 100 each 11  . AMBULATORY NON FORMULARY MEDICATION Change CPAP setting to 13.5 (14 is ok if 13.5 is not available). Please provide new machine as old machine was not fixable and Mr Rubens has improved AHI with new machine. 1 each 0  . Blood Glucose  Monitoring Suppl (CONTOUR NEXT MONITOR) w/Device KIT 1 Device by Does not apply route daily. Use to test blood sugar daily 1 kit 1  . Continuous Blood Gluc Sensor (FREESTYLE LIBRE 14 DAY SENSOR) MISC Apply topically every 14 (fourteen) days.    Marland Kitchen FIASP 100 UNIT/ML SOLN SMARTSIG:70 Unit(s) SUB-Q Daily    . FLUoxetine (PROZAC) 40 MG capsule TAKE 1 CAPSULE BY MOUTH ONCE DAILY 90 capsule 1  . GLUCAGON EMERGENCY 1 MG injection INJECT 1 MG INTO THE MUSCLE ONCE AS NEEDED. 1 kit 3  . glucose blood (CONTOUR NEXT TEST) test strip Use as instructed to check blood sugar 5 times daily. 450 each 12  . glucose blood test strip USE TO CHECK BLOOD SUGAR 5 TO 6 TIMES A DAY    . insulin lispro (HUMALOG) 100 UNIT/ML injection INJECT up to 70 units INTO THE SKIN DAILY-in the insulin pump 30 mL 11  . Lancets (FREESTYLE) lancets Use as instructed to check sugar 4 times daily. 400 each 5  . metFORMIN (GLUCOPHAGE) 1000 MG tablet Take 2,000 mg by mouth daily.    . metFORMIN (GLUCOPHAGE-XR) 500 MG 24 hr tablet TAKE 4 TABLETS BY MOUTH DAILY in the morning 360 tablet 3  . MULTIPLE VITAMIN PO Take 1 tablet by mouth daily.    . ondansetron (ZOFRAN ODT) 4 MG disintegrating tablet Take 1 tablet (4 mg total) by mouth every 8 (eight) hours as needed for nausea or vomiting. 20 tablet 0  . pantoprazole  (PROTONIX) 40 MG tablet Take 1 tablet (40 mg total) by mouth daily. 30 tablet 3  . [DISCONTINUED] ALPRAZolam (XANAX) 0.5 MG tablet Take 1 tablet (0.5 mg total) by mouth daily as needed for anxiety. 10 tablet 0   No current facility-administered medications on file prior to visit.   Allergies  Allergen Reactions  . Cymbalta [Duloxetine Hcl] Other (See Comments)    Suicidal thoughts  . Influenza Vaccines Other (See Comments)    Unknown reaction  . Penicillins Other (See Comments)    Unknown - childhood allergy per mother  . Ace Inhibitors Swelling    Angioedema with lisinopril  . Pioglitazone Other (See Comments)      headache  . Promethazine Hcl Nausea And Vomiting       . Reglan [Metoclopramide] Other (See Comments)    Jittery, increased anxiety   Family History  Adopted: Yes  Problem Relation Age of Onset  . Cancer Father 21       Prostate cancer  . Healthy Mother   . Diabetes Mellitus I Son     PE: BP 120/82   Pulse 69   Ht 6' (1.829 m)   Wt 220 lb (99.8 kg)   SpO2 97%   BMI 29.84 kg/m  Body mass index is 29.84 kg/m.  Wt Readings from Last 3 Encounters:  03/06/20 220 lb (99.8 kg)  02/24/20 220 lb (99.8 kg)  02/17/20 229 lb (103.9 kg)   Constitutional: overweight, in NAD Eyes: PERRLA, EOMI, no exophthalmos ENT: moist mucous membranes, no thyromegaly, no cervical lymphadenopathy Cardiovascular: RRR, No MRG Respiratory: CTA B Gastrointestinal: abdomen soft, NT, ND, BS+ Musculoskeletal: no deformities, strength intact in all 4, L foot in boot Skin: moist, warm, no rashes Neurological: no tremor with outstretched hands, DTR normal in all 4  ASSESSMENT: 1. DM1, uncontrolled, without complications - C-peptide of 3.67 (0.8-3.9), from 06/16/2005 ([facility]). No associated glucose reported. - son with DM1 (also on an insulin pump)  Component     Latest Ref  Rng & Units 08/17/2017  Glutamic Acid Decarb Ab     <5 IU/mL 25 (H)  Glucose, Plasma     65 - 99 mg/dL  102 (H)  C-Peptide     0.80 - 3.85 ng/mL 0.14 (L)  ZNT8 Antibodies     U/mL <15  Islet Cell Ab     Neg:<1:1 Negative   PLAN:   1. Patient with longstanding, uncontrolled, type 1 diabetes, on insulin pump (Medtronic 670 G, changed in 07/2017) and also Metformin. I last saw the patient 1.5 years ago. He went to see another endocrinology since then but decided to return to see me. -At last visit, we discussed about improving his diet by changing to a lower fat diet to improve his insulin resistance. At that time, we also strengthen his insulin to carb ratios with each meal and strengthen his insulin sensitivity factors. Latest HbA1c level was reviewed and this was better, at 8.2% in 03/2019.  However, he had another HbA1c obtained 4 months ago and this is better, at 7.6% reportedly. -At today's visit, he tells me that 4 months ago his sugars are better as he was keeping his diet under control.  Since then, he relaxed his diet and starting to eat more sweets and also snack between meals.  Sugars started to increase.  She is now trying to follow low-carb diet and he introduces small amounts of carbs into the pump with meals. -Reviewing his pump downloads, it appears that his sugars are off target especially after meals, even between meals.  There are no low blood sugars.  Highest blood sugar was 392. -He is now working from home, and has more flexibility in controlling his blood sugars, injecting insulin with every meal and not eating fast foods.  At last visit, he was driving to clients houses and kept his blood sugars higher than target on purpose to avoid hypoglycemia during his appointments. -At today's visit, we discussed about the importance of returning to the previous diet and cleaning his environment without high glycemic index and highly processed foods.  We discussed about starting doing this after the first of the year.  For now, reviewing his pump settings, he has a low basal rate throughout  the day, at 1.0, while in the past, this was 1.2 units/h.  We will increase these basal rates again.   -Also, since he is having a low carb diet, I suggested that for high-protein, low-carb meals, to enter 50% of the protein amount as carbs to avoid postprandial hyperglycemia.   -For now, we will continue Metformin ER, which she tolerates well.   -I also suggested to change from Humalog to Lyumjev, which has the advantage of being injected at the start of the meal, rather than 15 minutes before.  She believes the Lyumjev may be covered.  His son uses this and he discovered and he has the same insurance. -He has an appointment with PCP coming up next month.  He will have annual labs then. - I advised him to: Patient Instructions  Please continue: - Metformin 2000 mg in a.m.  Please change the pump settings: - basal rates: 12 am: 1.0 >> 1.2 units/h 6 am: 1.3 - ICR:   12 am: 1:6  4 pm: 1:4.5 10 pm: 1:7 - target: 90-120 - ISF:   12 am: 24  9 pm: 24 - Insulin on Board: 4h  Please enter 50% of the protein amount as carbs if you have a low carb meal.  Try to change from Humalog to Lyumjev - start boluses at the start of the meal.  Please return in 3 months.   - we checked his HbA1c: 8.8% (slightly higher) - advised to check sugars at different times of the day - 4x a day, rotating check times - advised for yearly eye exams >> he is UTD - return to clinic in 3 months   Total time spent for the visit: 40 min, in reviewing his pump downloads, discussing his hypo- and hyper-glycemic episodes, reviewing previous labs and pump settings and developing a plan to avoid hypo- and hyper-glycemia.    Philemon Kingdom, MD PhD [facility]

## 2020-03-10 ENCOUNTER — Ambulatory Visit: Payer: 59 | Admitting: Sports Medicine

## 2020-03-27 ENCOUNTER — Other Ambulatory Visit: Payer: Self-pay | Admitting: Family Medicine

## 2020-03-27 MED FILL — PANTOPRAZOLE SOD DR 40 MG T: 40 | 30 days supply | Qty: 30 | Fill #1

## 2020-04-02 ENCOUNTER — Encounter: Payer: Self-pay | Admitting: Internal Medicine

## 2020-04-15 ENCOUNTER — Ambulatory Visit: Payer: 59 | Admitting: Family Medicine

## 2020-04-20 ENCOUNTER — Ambulatory Visit: Payer: Self-pay | Admitting: Family Medicine

## 2020-04-20 MED FILL — FREESTYLE LIBRE 14 DAY SENS: 84 days supply | Qty: 6 | Fill #3

## 2020-04-24 IMAGING — MR MRI OF THE RIGHT KNEE WITHOUT CONTRAST
7 series · 40 of 40 positions shown · non-contrast
Comparison: None.

CLINICAL DATA: Right medial knee pain and weakness for 7 weeks

EXAM:
MRI OF THE RIGHT KNEE WITHOUT CONTRAST
TECHNIQUE: Multiplanar, multisequence MR imaging of the knee was performed. No
intravenous contrast was administered.

[Series 3: T2 fat-sat · axial · 4.0mm · 0.53mm/px · z∈[-84,+86]mm · 7 of 35 slices shown (1 of 3)]
[im 1/35]
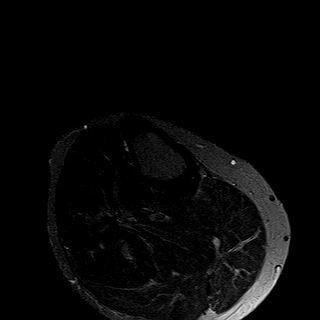
[im 6/35]
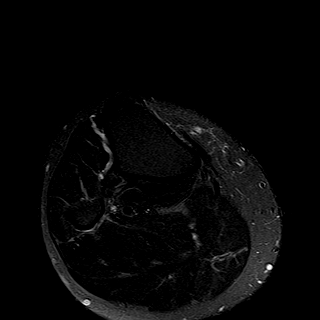
[im 12/35]
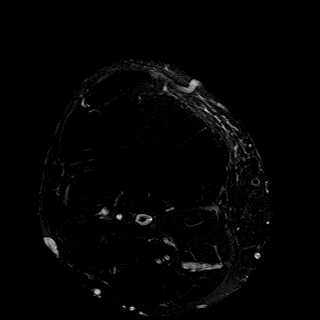
[im 18/35]
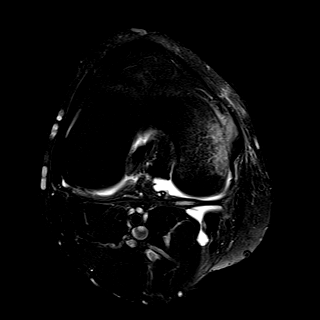
[im 23/35]
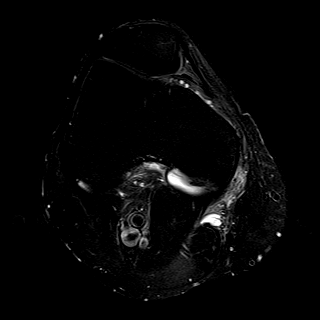
[im 29/35]
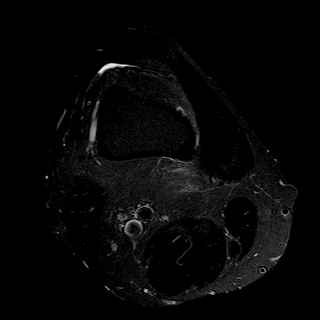
[im 35/35]
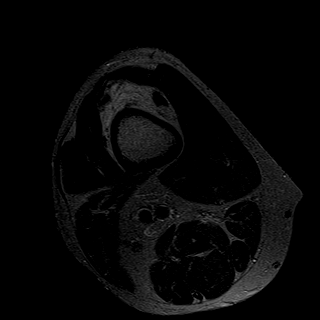

[Series 4: T1 · coronal · 4.0mm · 0.62mm/px · 6 of 33 slices shown]
[im 1/33]
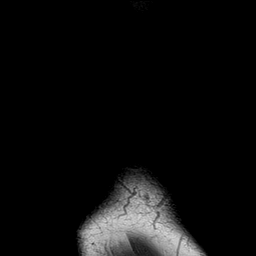
[im 7/33]
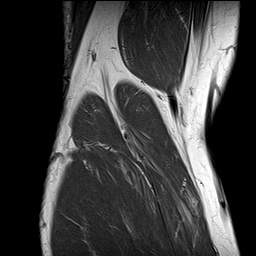
[im 13/33]
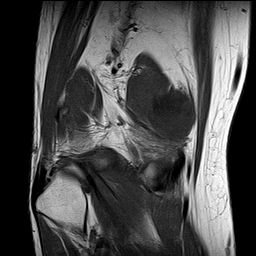
[im 20/33]
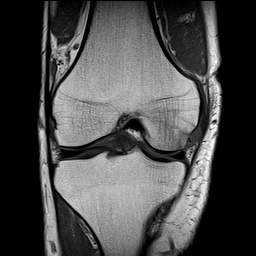
[im 26/33]
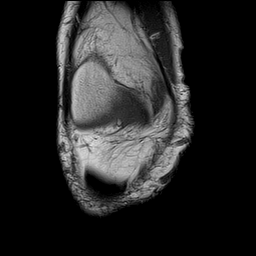
[im 33/33]
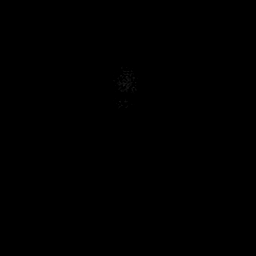

[Series 5: T2 fat-sat · coronal · 4.0mm · 0.62mm/px · 6 of 33 slices shown (2 of 3)]
[im 1/33]
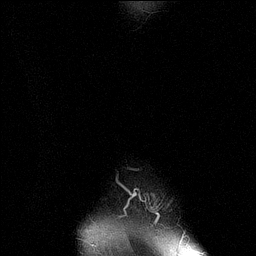
[im 7/33]
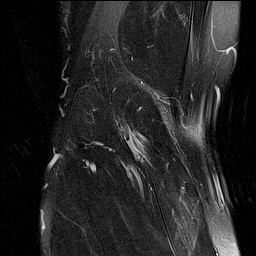
[im 13/33]
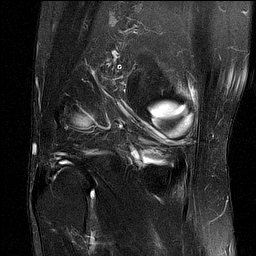
[im 20/33]
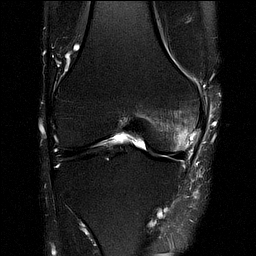
[im 26/33]
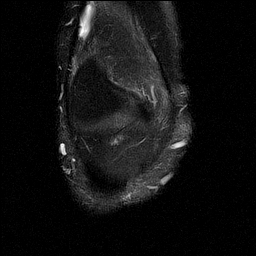
[im 33/33]
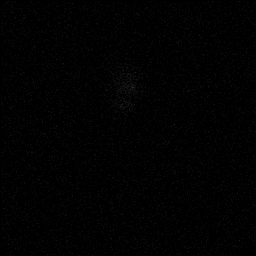

[Series 6: PD fat-sat · coronal · 4.0mm · 0.62mm/px · 6 of 33 slices shown (1 of 3)]
[im 1/33]
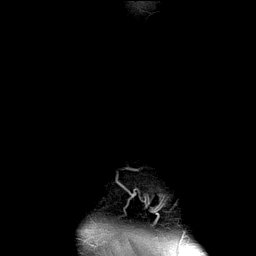
[im 7/33]
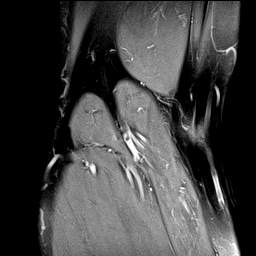
[im 13/33]
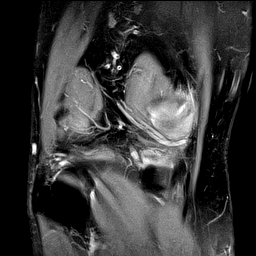
[im 20/33]
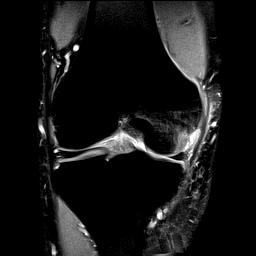
[im 26/33]
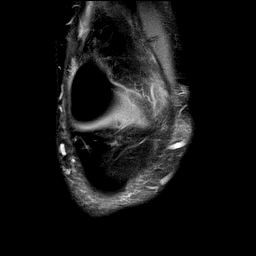
[im 33/33]
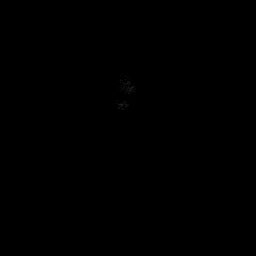

[Series 7: PD fat-sat · sagittal · 3.0mm · 0.62mm/px · 6 of 34 slices shown (2 of 3)]
[im 1/34]
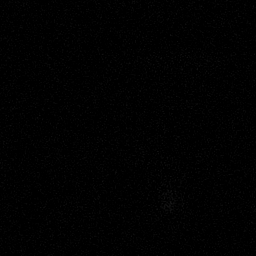
[im 7/34]
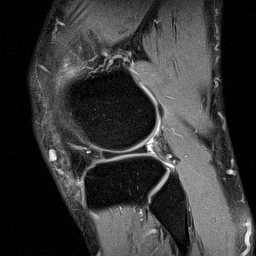
[im 14/34]
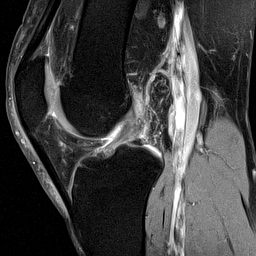
[im 20/34]
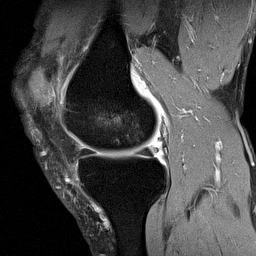
[im 27/34]
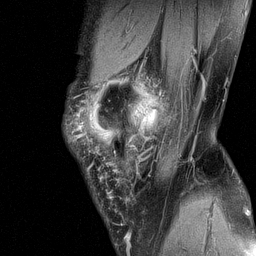
[im 34/34]
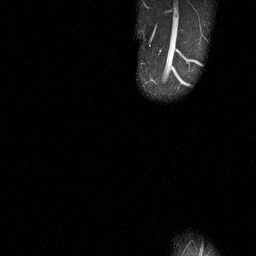

[Series 8: T2 fat-sat · sagittal · 3.0mm · 0.62mm/px · 6 of 35 slices shown (3 of 3)]
[im 1/35]
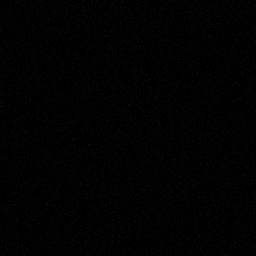
[im 7/35]
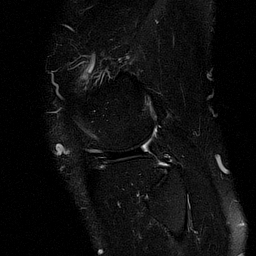
[im 14/35]
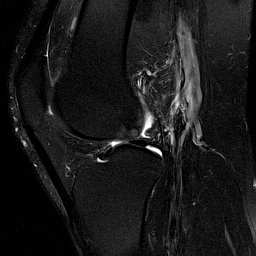
[im 21/35]
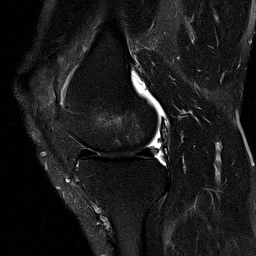
[im 28/35]
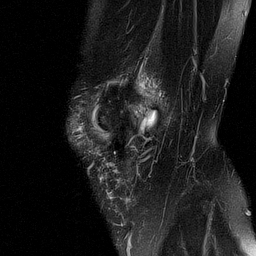
[im 35/35]
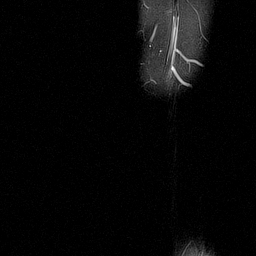

[Series 9: PD fat-sat · coronal · 2.0mm · 0.62mm/px · 3 of 19 slices shown (3 of 3)]
[im 1/19]
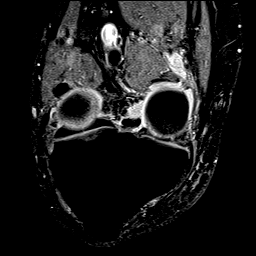
[im 10/19]
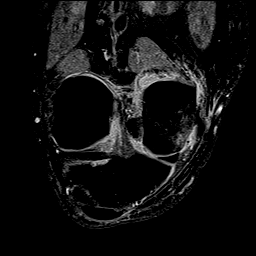
[im 19/19]
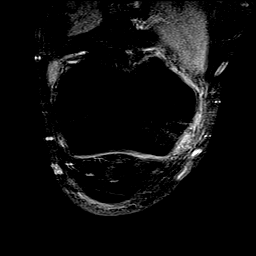

[40 of 40 positions shown; findings below may reference images not displayed]

FINDINGS: MENISCI

Medial meniscus: Small undersurface tear of the posterior horn-body
junction of the medial meniscus.

Lateral meniscus:  Intact.

LIGAMENTS

Cruciates:  Intact ACL and PCL.

Collaterals: Medial collateral ligament is intact. Lateral
collateral ligament complex is intact.

CARTILAGE

Patellofemoral:  No chondral defect.

Medial: Mild partial-thickness cartilage loss of the medial
femorotibial compartment.

Lateral:  No chondral defect.

Joint:  No joint effusion. Normal Hoffa's fat. No plical thickening.

Popliteal Fossa:  Small Baker's cyst.  Intact popliteus tendon.

Extensor Mechanism: Intact quadriceps tendon. Intact patellar
tendon. Intact medial patellar retinaculum. Intact lateral patellar
retinaculum. Intact MPFL.

Bones: Marrow edema in the periphery of medial femoral condyle with
a subtle area of subchondral linear low signal peripherally
concerning for a nondisplaced impaction fracture.

Other: No fluid collection or hematoma.
IMPRESSION: 1. Marrow edema in the periphery of medial femoral condyle with a
subtle area of subchondral linear low signal peripherally concerning
for a nondisplaced impaction fracture.
2. Small undersurface tear of the posterior horn-body junction of
the medial meniscus.

## 2020-04-27 MED FILL — FLUoxetine HCL 40 MG CAPS: 40 | 90 days supply | Qty: 90 | Fill #1

## 2020-04-28 MED FILL — LYUMJEV 100 UNIT/ML SOLN: 100 | 30 days supply | Qty: 30 | Fill #1

## 2020-04-30 ENCOUNTER — Telehealth: Payer: Self-pay | Admitting: Neurology

## 2020-04-30 ENCOUNTER — Encounter: Payer: Self-pay | Admitting: Family Medicine

## 2020-04-30 ENCOUNTER — Ambulatory Visit (INDEPENDENT_AMBULATORY_CARE_PROVIDER_SITE_OTHER): Payer: 59 | Admitting: Family Medicine

## 2020-04-30 ENCOUNTER — Other Ambulatory Visit: Payer: Self-pay

## 2020-04-30 VITALS — BP 122/80 | HR 73 | Wt 222.0 lb

## 2020-04-30 DIAGNOSIS — Z1329 Encounter for screening for other suspected endocrine disorder: Secondary | ICD-10-CM | POA: Diagnosis not present

## 2020-04-30 DIAGNOSIS — Z1211 Encounter for screening for malignant neoplasm of colon: Secondary | ICD-10-CM | POA: Diagnosis not present

## 2020-04-30 DIAGNOSIS — Z13220 Encounter for screening for lipoid disorders: Secondary | ICD-10-CM

## 2020-04-30 DIAGNOSIS — E1065 Type 1 diabetes mellitus with hyperglycemia: Secondary | ICD-10-CM | POA: Diagnosis not present

## 2020-04-30 DIAGNOSIS — Z0000 Encounter for general adult medical examination without abnormal findings: Secondary | ICD-10-CM | POA: Diagnosis not present

## 2020-04-30 NOTE — Patient Instructions (Signed)

## 2020-04-30 NOTE — Progress Notes (Signed)
Nathaniel [name] - 55 y.o. male MRN [id number]  Date of birth: Aug 13, 1965  Subjective No chief complaint on file.   HPI Nathaniel [name] is a 56 y.o. male here today for annual exam.  He has a history of T1DM, OSA, chronic headaches and depression.   He sees endocrinology for management of diabetes.  Has had difficulty sticking to low carb diet.  Recently re-started a gym membership, plans to start going regulary. Lab Results  Component Value Date   HGBA1C 8.8 (A) 03/06/2020   He is due for updated colon cancer screening.   He has had COVID booster and flu vaccine.   Depression screen [facility] 2/9 04/30/2020 04/30/2019 04/23/2018  Decreased Interest 0 1 1  Down, Depressed, Hopeless 1 1 1   PHQ - 2 Score 1 2 2   Altered sleeping 0 1 0  Tired, decreased energy 2 2 1   Change in appetite 0 0 0  Feeling bad or failure about yourself  0 0 0  Trouble concentrating 0 0 0  Moving slowly or fidgety/restless 0 0 0  Suicidal thoughts 0 0 0  PHQ-9 Score 3 5 3   Difficult doing work/chores Not difficult at all - Not difficult at all  Some recent data might be hidden   GAD 7 : Generalized Anxiety Score 04/30/2020 04/30/2019 04/23/2018 08/15/2017  Nervous, Anxious, on Edge 1 1 1 1   Control/stop worrying 0 0 0 1  Worry too much - different things 0 1 0 1  Trouble relaxing 0 0 0 0  Restless 0 0 0 0  Easily annoyed or irritable 1 1 0 0  Afraid - awful might happen 1 0 0 1  Total GAD 7 Score 3 3 1 4   Anxiety Difficulty Not difficult at all - Not difficult at all -      Review of Systems  Constitutional: Negative for chills, fever, malaise/fatigue and weight loss.  HENT: Negative for congestion, ear pain and sore throat.   Eyes: Negative for blurred vision, double vision and pain.  Respiratory: Negative for cough and shortness of breath.   Cardiovascular: Negative for chest pain and palpitations.  Gastrointestinal: Negative for abdominal pain, blood in stool, constipation, heartburn and nausea.   Genitourinary: Negative for dysuria and urgency.  Musculoskeletal: Negative for joint pain and myalgias.  Neurological: Negative for dizziness and headaches.  Endo/Heme/Allergies: Does not bruise/bleed easily.  Psychiatric/Behavioral: Negative for depression. The patient is not nervous/anxious and does not have insomnia.      Allergies  Allergen Reactions  . Cymbalta [Duloxetine Hcl] Other (See Comments)    Suicidal thoughts  . Influenza Vaccines Other (See Comments)    Unknown reaction  . Penicillins Other (See Comments)    Unknown - childhood allergy per mother  . Ace Inhibitors Swelling    Angioedema with lisinopril  . Pioglitazone Other (See Comments)      headache  . Promethazine Hcl Nausea And Vomiting       . Reglan [Metoclopramide] Other (See Comments)    Jittery, increased anxiety    Past Medical History:  Diagnosis Date  . Depression   . Diabetes mellitus    type II  . GERD (gastroesophageal reflux disease)   . Headache(784.0)   . Hypogonadism male   . Sleep apnea     Past Surgical History:  Procedure Laterality Date  . NASAL SINUS SURGERY  04/1991  . TONSILLECTOMY  1972    Social History   Socioeconomic History  . Marital status: Married  Spouse name: Marva  . Number of children: 2  . Years of education: Not on file  . Highest education level: Not on file  Occupational History    Employer: [employer]    Comment: Mental Health Case Management  Tobacco Use  . Smoking status: Never Smoker  . Smokeless tobacco: Never Used  Vaping Use  . Vaping Use: Never used  Substance and Sexual Activity  . Alcohol use: No  . Drug use: No  . Sexual activity: Not on file  Other Topics Concern  . Not on file  Social History Narrative   He lives with wife and two children.   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   Highest level of education: master degree   Caffeine- diet soda 2 L a day   Social Determinants  of Health   Financial Resource Strain: Not on file  Food Insecurity: Not on file  Transportation Needs: Not on file  Physical Activity: Not on file  Stress: Not on file  Social Connections: Not on file    Family History  Adopted: Yes  Problem Relation Age of Onset  . Cancer Father 54       Prostate cancer  . Healthy Mother   . Diabetes Mellitus I Son     Health Maintenance  Topic Date Due  . Hepatitis C Screening  Never done  . HIV Screening  Never done  . OPHTHALMOLOGY EXAM  07/19/2018  . COVID-19 Vaccine (3 - Booster for Moderna series) 11/24/2019  . Fecal DNA (Cologuard)  02/29/2020  . URINE MICROALBUMIN  04/04/2020  . FOOT EXAM  04/29/2020  . HEMOGLOBIN A1C  09/04/2020  . TETANUS/TDAP  03/01/2021  . INFLUENZA VACCINE  Completed  . PNEUMOCOCCAL POLYSACCHARIDE VACCINE AGE 81-64 HIGH RISK  Completed     ----------------------------------------------------------------------------------------------------------------------------------------------------------------------------------------------------------------- Physical Exam BP 122/80   Pulse 73   Wt 222 lb (100.7 kg)   SpO2 99%   BMI 30.11 kg/m   Physical Exam Constitutional:      General: He is not in acute distress.    Appearance: He is well-nourished.  HENT:     Head: Normocephalic and atraumatic.     Right Ear: Tympanic membrane and external ear normal.     Left Ear: Tympanic membrane and external ear normal.     Mouth/Throat:     Mouth: Oropharynx is clear and moist.  Eyes:     General: No scleral icterus. Neck:     Thyroid: No thyromegaly.  Cardiovascular:     Rate and Rhythm: Normal rate and regular rhythm.     Pulses: Intact distal pulses.     Heart sounds: Normal heart sounds.  Pulmonary:     Effort: Pulmonary effort is normal.     Breath sounds: Normal breath sounds.  Abdominal:     General: Bowel sounds are normal. There is no distension.     Palpations: Abdomen is soft.     Tenderness:  There is no abdominal tenderness. There is no guarding.  Musculoskeletal:        General: No edema.     Cervical back: Normal range of motion.  Lymphadenopathy:     Cervical: No cervical adenopathy.  Skin:    General: Skin is warm and dry.     Findings: No rash.  Neurological:     Mental Status: He is alert and oriented to person, place, and time.     Cranial Nerves: No cranial nerve deficit.  Motor: No abnormal muscle tone.  Psychiatric:        Mood and Affect: Mood and affect normal.        Behavior: Behavior normal.     ------------------------------------------------------------------------------------------------------------------------------------------------------------------------------------------------------------------- Assessment and Plan  Well adult exam Well adult Orders Placed This Encounter  Procedures  . CBC with Differential/Platelet  . COMPLETE METABOLIC PANEL WITH GFR  . Lipid Panel w/reflex Direct LDL  . TSH  . Cologuard  Screening: Cologuard, TSH Immunizations: UTD Anticipatory guidance/Risk factor reduction:  Recommendations per AVS.    No orders of the defined types were placed in this encounter.   No follow-ups on file.    This visit occurred during the SARS-CoV-2 public health emergency.  Safety protocols were in place, including screening questions prior to the visit, additional usage of staff PPE, and extensive cleaning of exam room while observing appropriate contact time as indicated for disinfecting solutions.

## 2020-04-30 NOTE — Telephone Encounter (Signed)
Cologuard order faxed to [phone] with confirmation received. They will contact the patient directly.   

## 2020-04-30 NOTE — Assessment & Plan Note (Signed)
Well adult Orders Placed This Encounter  Procedures  . CBC with Differential/Platelet  . COMPLETE METABOLIC PANEL WITH GFR  . Lipid Panel w/reflex Direct LDL  . TSH  . Cologuard  Screening: Cologuard, TSH Immunizations: UTD Anticipatory guidance/Risk factor reduction:  Recommendations per AVS.

## 2020-05-01 ENCOUNTER — Other Ambulatory Visit: Payer: Self-pay | Admitting: Family Medicine

## 2020-05-01 ENCOUNTER — Encounter: Payer: Self-pay | Admitting: Family Medicine

## 2020-05-01 LAB — COMPLETE METABOLIC PANEL WITH GFR
AG Ratio: 1.5 (calc) (ref 1.0–2.5)
ALT: 16 U/L (ref 9–46)
AST: 19 U/L (ref 10–35)
Albumin: 4.3 g/dL (ref 3.6–5.1)
Alkaline phosphatase (APISO): 62 U/L (ref 35–144)
BUN: 14 mg/dL (ref 7–25)
CO2: 29 mmol/L (ref 20–32)
Calcium: 9.9 mg/dL (ref 8.6–10.3)
Chloride: 99 mmol/L (ref 98–110)
Creat: 0.97 mg/dL (ref 0.70–1.33)
GFR, Est African American: 102 mL/min/{1.73_m2} (ref >=60)
GFR, Est Non African American: 88 mL/min/{1.73_m2} (ref >=60)
Globulin: 2.8 g/dL (calc) (ref 1.9–3.7)
Glucose, Bld: 153 mg/dL — ABNORMAL HIGH (ref 65–139)
Potassium: 4.4 mmol/L (ref 3.5–5.3)
Sodium: 136 mmol/L (ref 135–146)
Total Bilirubin: 0.9 mg/dL (ref 0.2–1.2)
Total Protein: 7.1 g/dL (ref 6.1–8.1)

## 2020-05-01 LAB — CBC WITH DIFFERENTIAL/PLATELET
Absolute Monocytes: 549 cells/uL (ref 200–950)
Basophils Absolute: 47 cells/uL (ref 0–200)
Basophils Relative: 0.8 %
Eosinophils Absolute: 130 cells/uL (ref 15–500)
Eosinophils Relative: 2.2 %
HCT: 42.8 % (ref 38.5–50.0)
Hemoglobin: 13.8 g/dL (ref 13.2–17.1)
Lymphs Abs: 1493 cells/uL (ref 850–3900)
MCH: 28.2 pg (ref 27.0–33.0)
MCHC: 32.2 g/dL (ref 32.0–36.0)
MCV: 87.3 fL (ref 80.0–100.0)
MPV: 10.8 fL (ref 7.5–12.5)
Monocytes Relative: 9.3 %
Neutro Abs: 3682 cells/uL (ref 1500–7800)
Neutrophils Relative %: 62.4 %
Platelets: 247 10*3/uL (ref 140–400)
RBC: 4.9 10*6/uL (ref 4.20–5.80)
RDW: 12 % (ref 11.0–15.0)
Total Lymphocyte: 25.3 %
WBC: 5.9 10*3/uL (ref 3.8–10.8)

## 2020-05-01 LAB — LIPID PANEL W/REFLEX DIRECT LDL
Cholesterol: 202 mg/dL — ABNORMAL HIGH (ref <200)
HDL: 59 mg/dL (ref >=40)
LDL Cholesterol (Calc): 120 mg/dL (calc) — ABNORMAL HIGH
Non-HDL Cholesterol (Calc): 143 mg/dL (calc) — ABNORMAL HIGH (ref <130)
Total CHOL/HDL Ratio: 3.4 (calc) (ref <5.0)
Triglycerides: 121 mg/dL (ref <150)

## 2020-05-01 LAB — TSH: TSH: 1.85 mIU/L (ref 0.40–4.50)

## 2020-05-01 MED ORDER — ROSUVASTATIN CALCIUM 20 MG PO TABS: 20.0000 mg | ORAL_TABLET | Freq: Every day | ORAL | 3 refills | Status: DC

## 2020-05-01 MED FILL — ROSUVASTATIN CALCIUM 20 MG: 20 | 90 days supply | Qty: 90 | Fill #0

## 2020-05-12 DIAGNOSIS — E1065 Type 1 diabetes mellitus with hyperglycemia: Secondary | ICD-10-CM | POA: Diagnosis not present

## 2020-05-13 DIAGNOSIS — Z1211 Encounter for screening for malignant neoplasm of colon: Secondary | ICD-10-CM | POA: Diagnosis not present

## 2020-05-14 ENCOUNTER — Encounter: Payer: Self-pay | Admitting: Internal Medicine

## 2020-05-18 MED FILL — PANTOPRAZOLE SOD DR 40 MG T: 40 | 90 days supply | Qty: 90 | Fill #0

## 2020-05-20 ENCOUNTER — Encounter: Payer: Self-pay | Admitting: Family Medicine

## 2020-05-20 LAB — COLOGUARD
COLOGUARD: NEGATIVE
COLOGUARD: NEGATIVE
Cologuard: NEGATIVE

## 2020-05-21 ENCOUNTER — Other Ambulatory Visit (HOSPITAL_COMMUNITY): Payer: Self-pay | Admitting: Pharmacist

## 2020-05-21 MED FILL — CARESTART COVID-19 HOME TES: 4 days supply | Qty: 4 | Fill #0

## 2020-06-04 ENCOUNTER — Encounter: Payer: Self-pay | Admitting: Internal Medicine

## 2020-06-05 ENCOUNTER — Other Ambulatory Visit: Payer: Self-pay | Admitting: Internal Medicine

## 2020-06-05 MED ORDER — LYUMJEV KWIKPEN 100 UNIT/ML ~~LOC~~ SOPN: PEN_INJECTOR | SUBCUTANEOUS | 3 refills | Status: DC

## 2020-06-05 MED ORDER — TRESIBA FLEXTOUCH 200 UNIT/ML ~~LOC~~ SOPN: PEN_INJECTOR | SUBCUTANEOUS | 3 refills | Status: DC

## 2020-06-05 MED ORDER — INSULIN PEN NEEDLE 32G X 4 MM MISC: 3 refills | Status: DC

## 2020-06-08 MED FILL — METFORMIN HCL 1000 MG TABS: 1000 | 90 days supply | Qty: 180 | Fill #2

## 2020-06-09 ENCOUNTER — Other Ambulatory Visit: Payer: Self-pay | Admitting: Internal Medicine

## 2020-06-09 MED ORDER — INSULIN LISPRO (1 UNIT DIAL) 100 UNIT/ML (KWIKPEN): PEN_INJECTOR | SUBCUTANEOUS | 3 refills | Status: DC

## 2020-06-09 MED ORDER — BASAGLAR KWIKPEN 100 UNIT/ML ~~LOC~~ SOPN
20.0000 [IU] | PEN_INJECTOR | Freq: Every day | SUBCUTANEOUS | 3 refills | Status: DC
Start: 2020-06-09 — End: 2020-06-09

## 2020-06-09 MED FILL — BASAGLAR 100 UNIT/ML KWIKPE: 100 | 86 days supply | Qty: 24 | Fill #0

## 2020-06-09 MED FILL — HUMALOG 100 UNITS/ML KWIKPE: 100 | 83 days supply | Qty: 30 | Fill #0

## 2020-06-19 ENCOUNTER — Ambulatory Visit: Payer: 59 | Admitting: Internal Medicine

## 2020-07-09 ENCOUNTER — Encounter: Payer: Self-pay | Admitting: Internal Medicine

## 2020-07-10 ENCOUNTER — Other Ambulatory Visit (HOSPITAL_BASED_OUTPATIENT_CLINIC_OR_DEPARTMENT_OTHER): Payer: Self-pay

## 2020-07-10 ENCOUNTER — Other Ambulatory Visit: Payer: Self-pay | Admitting: Internal Medicine

## 2020-07-10 MED ORDER — LYUMJEV 100 UNIT/ML IJ SOLN
INTRAMUSCULAR | 11 refills | Status: DC
  Filled 2020-07-10: qty 30, 38d supply, fill #0
  Filled 2020-10-09: qty 30, 38d supply, fill #1
  Filled 2020-12-15: qty 30, 38d supply, fill #2
  Filled 2021-02-17: qty 30, 38d supply, fill #3

## 2020-07-29 ENCOUNTER — Encounter: Payer: Self-pay | Admitting: Family Medicine

## 2020-07-29 ENCOUNTER — Other Ambulatory Visit (HOSPITAL_BASED_OUTPATIENT_CLINIC_OR_DEPARTMENT_OTHER): Payer: Self-pay

## 2020-07-29 MED ORDER — FLUOXETINE HCL 40 MG PO CAPS
ORAL_CAPSULE | Freq: Every day | ORAL | 1 refills | Status: DC
  Filled 2020-07-29: qty 90, 90d supply, fill #0

## 2020-07-29 NOTE — Telephone Encounter (Signed)
Pt stated that he was going to UC.

## 2020-07-30 ENCOUNTER — Other Ambulatory Visit (HOSPITAL_BASED_OUTPATIENT_CLINIC_OR_DEPARTMENT_OTHER): Payer: Self-pay

## 2020-07-30 MED FILL — Rosuvastatin Calcium Tab 20 MG: ORAL | 90 days supply | Qty: 90 | Fill #0 | Status: AC

## 2020-08-09 ENCOUNTER — Encounter: Payer: Self-pay | Admitting: Family Medicine

## 2020-08-10 NOTE — Telephone Encounter (Signed)
Appointment for May 24th.

## 2020-08-10 NOTE — Telephone Encounter (Signed)
Recommend appt to discuss treatment options.

## 2020-08-11 ENCOUNTER — Telehealth (INDEPENDENT_AMBULATORY_CARE_PROVIDER_SITE_OTHER): Payer: 59 | Admitting: Family Medicine

## 2020-08-11 ENCOUNTER — Encounter: Payer: Self-pay | Admitting: Family Medicine

## 2020-08-11 ENCOUNTER — Other Ambulatory Visit (HOSPITAL_BASED_OUTPATIENT_CLINIC_OR_DEPARTMENT_OTHER): Payer: Self-pay

## 2020-08-11 DIAGNOSIS — U071 COVID-19: Secondary | ICD-10-CM | POA: Insufficient documentation

## 2020-08-11 DIAGNOSIS — E785 Hyperlipidemia, unspecified: Secondary | ICD-10-CM | POA: Insufficient documentation

## 2020-08-11 HISTORY — DX: Hyperlipidemia, unspecified: E78.5

## 2020-08-11 HISTORY — DX: COVID-19: U07.1

## 2020-08-11 MED ORDER — NIRMATRELVIR/RITONAVIR (PAXLOVID)TABLET
ORAL_TABLET | ORAL | 0 refills | Status: DC
  Filled 2020-08-11: qty 30, 10d supply, fill #0

## 2020-08-11 MED ORDER — HYDROCODONE BIT-HOMATROP MBR 5-1.5 MG/5ML PO SOLN
5.0000 mL | Freq: Three times a day (TID) | ORAL | 0 refills | Status: DC | PRN
  Filled 2020-08-11: qty 120, 8d supply, fill #0

## 2020-08-11 NOTE — Progress Notes (Signed)
Symptoms started: Thursday  Current symptoms: body ache, headache, congestion, cough, sore throat  Medications: OTC

## 2020-08-11 NOTE — Progress Notes (Signed)
Nathaniel [name] - 55 y.o. male MRN [id number]  Date of birth: 1965/11/17   This visit type was conducted due to national recommendations for restrictions regarding the COVID-19 Pandemic (e.g. social distancing).  This format is felt to be most appropriate for this patient at this time.  All issues noted in this document were discussed and addressed.  No physical exam was performed (except for noted visual exam findings with Video Visits).  I discussed the limitations of evaluation and management by telemedicine and the availability of in person appointments. The patient expressed understanding and agreed to proceed.  I connected with@ on 08/11/20 at  8:10 AM EDT by a video enabled telemedicine application and verified that I am speaking with the correct person using two identifiers.  Present at visit: Luetta Nutting, DO Jodie Echevaria   Patient Location: [place] [id number]   Provider location:   [facility]  Chief Complaint  Patient presents with  . Covid Positive    HPI  Nathaniel [name] is a 55 y.o. male who presents via audio/video conferencing for a telehealth visit today.  He has complaint of COVID today.  Reports onset of symptoms 5 days ago with fever, congestion, fatigue, body aches and cough.  He denies shortness of breath, chest pain, nausea, vomiting, diarrhea.  He is using some left over tessalon but hasn't gotten much relief with this.  He is eating and drinking normally.     ROS:  A comprehensive ROS was completed and negative except as noted per HPI  Past Medical History:  Diagnosis Date  . Depression   . Diabetes mellitus    type II  . GERD (gastroesophageal reflux disease)   . Headache(784.0)   . Hypogonadism male   . Sleep apnea     Past Surgical History:  Procedure Laterality Date  . NASAL SINUS SURGERY  04/1991  . TONSILLECTOMY  1972    Family History  Adopted: Yes  Problem Relation Age of Onset  . Cancer Father 39       Prostate  cancer  . Healthy Mother   . Diabetes Mellitus I Son     Social History   Socioeconomic History  . Marital status: Married    Spouse name: [name]  . Number of children: 2  . Years of education: Not on file  . Highest education level: Not on file  Occupational History    Employer: [employer]    Comment: Mental Health Case Management  Tobacco Use  . Smoking status: Never Smoker  . Smokeless tobacco: Never Used  Vaping Use  . Vaping Use: Never used  Substance and Sexual Activity  . Alcohol use: No  . Drug use: No  . Sexual activity: Not on file  Other Topics Concern  . Not on file  Social History Narrative   He lives with wife and two children.   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   Highest level of education: master degree   Caffeine- diet soda 2 L a day   Social Determinants of Health   Financial Resource Strain: Not on file  Food Insecurity: Not on file  Transportation Needs: Not on file  Physical Activity: Not on file  Stress: Not on file  Social Connections: Not on file  Intimate Partner Violence: Not on file     Current Outpatient Medications:  .  Alcohol Swabs (ALCOHOL PREPS) PADS, Use alcohol pads to clean site to be  used to check blood sugar., Disp: 100 each, Rfl: 11 .  AMBULATORY NON FORMULARY MEDICATION, Change CPAP setting to 13.5 (14 is ok if 13.5 is not available). Please provide new machine as old machine was not fixable and Mr Raczka has improved AHI with new machine., Disp: 1 each, Rfl: 0 .  Blood Glucose Monitoring Suppl (CONTOUR NEXT MONITOR) w/Device KIT, 1 Device by Does not apply route daily. Use to test blood sugar daily, Disp: 1 kit, Rfl: 1 .  Continuous Blood Gluc Sensor (FREESTYLE LIBRE 14 DAY SENSOR) MISC, Apply topically every 14 (fourteen) days., Disp: , Rfl:  .  COVID-19 At Home Antigen Test KIT, USE AS DIRECTED, Disp: 4 kit, Rfl: 0 .  FLUoxetine (PROZAC) 40 MG capsule, TAKE 1 CAPSULE BY MOUTH  ONCE DAILY, Disp: 90 capsule, Rfl: 1 .  Glucagon 3 MG/DOSE POWD, PLACE 3 MG INTO THE NOSE ONCE AS NEEDED FOR UP TO 1 DOSE., Disp: 2 each, Rfl: 11 .  glucose blood (CONTOUR NEXT TEST) test strip, Use as instructed to check blood sugar 5 times daily., Disp: 450 each, Rfl: 12 .  glucose blood test strip, USE TO CHECK BLOOD SUGAR 5 TO 6 TIMES A DAY, Disp: , Rfl:  .  HYDROcodone bit-homatropine (HYCODAN) 5-1.5 MG/5ML syrup, Take 5 mLs by mouth every 8 (eight) hours as needed for cough., Disp: 120 mL, Rfl: 0 .  Insulin Lispro-aabc (LYUMJEV) 100 UNIT/ML SOLN, Use up to 80 units a day in the insulin pump., Disp: 30 mL, Rfl: 11 .  Insulin Pen Needle (PEN NEEDLES) 32G X 4 MM MISC, USE AS DIRECTED UP TO SIX TIMES DAILY, Disp: 300 each, Rfl: 3 .  Insulin Pen Needle 32G X 4 MM MISC, Use up to 6x a day, Disp: 300 each, Rfl: 3 .  Lancets (FREESTYLE) lancets, Use as instructed to check sugar 4 times daily., Disp: 400 each, Rfl: 5 .  metFORMIN (GLUCOPHAGE) 1000 MG tablet, TAKE 2 TABLETS BY MOUTH DAILY, Disp: 180 tablet, Rfl: 2 .  metFORMIN (GLUCOPHAGE-XR) 500 MG 24 hr tablet, TAKE 4 TABLETS BY MOUTH DAILY in the morning, Disp: 360 tablet, Rfl: 3 .  MULTIPLE VITAMIN PO, Take 1 tablet by mouth daily., Disp: , Rfl:  .  nirmatrelvir/ritonavir EUA (PAXLOVID) TABS, Patient GFR is 88.Take nirmatrelvir (150 mg) two tablets twice daily for 5 days and ritonavir (100 mg) one tablet twice daily for 5 days., Disp: 30 tablet, Rfl: 0 .  ondansetron (ZOFRAN ODT) 4 MG disintegrating tablet, Take 1 tablet (4 mg total) by mouth every 8 (eight) hours as needed for nausea or vomiting., Disp: 20 tablet, Rfl: 0 .  pantoprazole (PROTONIX) 40 MG tablet, TAKE 1 TABLET BY MOUTH ONCE DAILY, Disp: 90 tablet, Rfl: 3 .  rosuvastatin (CRESTOR) 20 MG tablet, TAKE 1 TABLET (20 MG TOTAL) BY MOUTH DAILY., Disp: 90 tablet, Rfl: 3  EXAM:  VITALS per patient if applicable: Ht 6' (8.828 m)   Wt 220 lb (99.8 kg)   BMI 29.84 kg/m   GENERAL: alert,  oriented, appears well and in no acute distress  HEENT: atraumatic, conjunttiva clear, no obvious abnormalities on inspection of external nose and ears  NECK: normal movements of the head and neck  LUNGS: on inspection no signs of respiratory distress, breathing rate appears normal, no obvious gross SOB, gasping or wheezing  CV: no obvious cyanosis  MS: moves all visible extremities without noticeable abnormality  PSYCH/NEURO: pleasant and cooperative, no obvious depression or anxiety, speech and thought processing grossly  intact  ASSESSMENT AND PLAN:  Discussed the following assessment and plan:  COVID-19 He is at increased risk for hospitalization due to co-morbidities.  Starting paxlovid.  Recommend staying well hydrated.  Red flags including difficulty breathing and dehydration discussed and that he should be seen in ED if these occur.  He expresses understanding.      I discussed the assessment and treatment plan with the patient. The patient was provided an opportunity to ask questions and all were answered. The patient agreed with the plan and demonstrated an understanding of the instructions.   The patient was advised to call back or seek an in-person evaluation if the symptoms worsen or if the condition fails to improve as anticipated.    Luetta Nutting, DO

## 2020-08-11 NOTE — Assessment & Plan Note (Signed)
He is at increased risk for hospitalization due to co-morbidities.  Starting paxlovid.  Recommend staying well hydrated.  Red flags including difficulty breathing and dehydration discussed and that he should be seen in ED if these occur.  He expresses understanding.

## 2020-08-12 ENCOUNTER — Other Ambulatory Visit (HOSPITAL_BASED_OUTPATIENT_CLINIC_OR_DEPARTMENT_OTHER): Payer: Self-pay

## 2020-08-13 ENCOUNTER — Encounter: Payer: Self-pay | Admitting: Family Medicine

## 2020-08-13 DIAGNOSIS — E1065 Type 1 diabetes mellitus with hyperglycemia: Secondary | ICD-10-CM | POA: Diagnosis not present

## 2020-08-14 DIAGNOSIS — G4733 Obstructive sleep apnea (adult) (pediatric): Secondary | ICD-10-CM | POA: Diagnosis not present

## 2020-08-21 ENCOUNTER — Other Ambulatory Visit (HOSPITAL_BASED_OUTPATIENT_CLINIC_OR_DEPARTMENT_OTHER): Payer: Self-pay

## 2020-08-21 ENCOUNTER — Other Ambulatory Visit: Payer: Self-pay | Admitting: Internal Medicine

## 2020-08-21 MED FILL — Continuous Glucose System Sensor: 28 days supply | Qty: 2 | Fill #0 | Status: AC

## 2020-08-24 ENCOUNTER — Other Ambulatory Visit (HOSPITAL_BASED_OUTPATIENT_CLINIC_OR_DEPARTMENT_OTHER): Payer: Self-pay

## 2020-09-14 ENCOUNTER — Encounter: Payer: Self-pay | Admitting: Internal Medicine

## 2020-09-16 ENCOUNTER — Other Ambulatory Visit: Payer: Self-pay | Admitting: Internal Medicine

## 2020-09-16 ENCOUNTER — Other Ambulatory Visit (HOSPITAL_BASED_OUTPATIENT_CLINIC_OR_DEPARTMENT_OTHER): Payer: Self-pay

## 2020-09-16 MED ORDER — METFORMIN HCL 1000 MG PO TABS
1000.0000 mg | ORAL_TABLET | Freq: Two times a day (BID) | ORAL | 3 refills | Status: DC
  Filled 2020-09-16: qty 180, 90d supply, fill #0

## 2020-09-17 ENCOUNTER — Other Ambulatory Visit: Payer: Self-pay

## 2020-09-17 ENCOUNTER — Other Ambulatory Visit (HOSPITAL_BASED_OUTPATIENT_CLINIC_OR_DEPARTMENT_OTHER): Payer: Self-pay

## 2020-09-17 DIAGNOSIS — E1065 Type 1 diabetes mellitus with hyperglycemia: Secondary | ICD-10-CM

## 2020-09-17 MED ORDER — METFORMIN HCL 1000 MG PO TABS
1000.0000 mg | ORAL_TABLET | Freq: Two times a day (BID) | ORAL | 0 refills | Status: DC
  Filled 2020-09-17 (×2): qty 60, 30d supply, fill #0

## 2020-09-18 ENCOUNTER — Ambulatory Visit: Payer: 59 | Admitting: Internal Medicine

## 2020-09-18 ENCOUNTER — Other Ambulatory Visit: Payer: Self-pay

## 2020-09-18 ENCOUNTER — Encounter: Payer: Self-pay | Admitting: Internal Medicine

## 2020-09-18 VITALS — BP 120/80 | HR 79 | Ht 72.0 in | Wt 217.2 lb

## 2020-09-18 DIAGNOSIS — E1065 Type 1 diabetes mellitus with hyperglycemia: Secondary | ICD-10-CM

## 2020-09-18 DIAGNOSIS — E663 Overweight: Secondary | ICD-10-CM

## 2020-09-18 LAB — POCT GLYCOSYLATED HEMOGLOBIN (HGB A1C): Hemoglobin A1C: 9.4 % — AB (ref 4.0–5.6)

## 2020-09-18 NOTE — Addendum Note (Signed)
Addended by: Eliseo Squires on: 09/18/2020 05:09 PM   Modules accepted: Orders

## 2020-09-18 NOTE — Patient Instructions (Addendum)
Please continue: - Metformin 2000 mg in a.m.  Please change the pump settings: - basal rates: 12 am: 1.2 units/h 6 am: 1.3 - ICR:   12 am: 1:6  4 pm: 1:4.5 10 pm: 1:7 - target: 90-120 - ISF:   12 am: 24  9 pm: 24 - Insulin on Board: 3h  Please enter 50% of the protein amount as carbs if you have a low carb meal.  Do an Insulin to carb ratio validation as we discussed.  Enter ALL the carbs in the pump.  Please return in 3-4 months.

## 2020-09-18 NOTE — Progress Notes (Signed)
Patient ID: Nathaniel [name], male   DOB: 1965/07/10, 55 y.o.   MRN: [id number]  This visit occurred during the SARS-CoV-2 public health emergency.  Safety protocols were in place, including screening questions prior to the visit, additional usage of staff PPE, and extensive cleaning of exam room while observing appropriate contact time as indicated for disinfecting solutions.   HPI: Nathaniel [name] is a 55 y.o.-year-old man, presenting for f/u for DM1, dx as DM2 in 2001, and as DM1 in 07/2017, insulin-dependent since 2010, uncontrolled, without long-term complications. Last visit 07/2018 (virtual). He saw another provider (Dr. Garnet Koyanagi) in 2020, but then decided to return to see me.  Interim history: He had an ankle fracture 02/17/2020.  This has healed. Before last visit he switched to working from home and this allowed he will more flexibility with the diabetes control.  He continues to work from home. Since his last visit he also switched back to insulin pens in 05/2020, but sugars worsened so he went back on the pump in 06/2020. She was able to switch to Lyumjev since last visit. He had COVID-19 in 07/2020. H recently restarted exercise - running + weights.  Reviewed HbA1c levels:  Lab Results  Component Value Date   HGBA1C 8.8 (A) 03/06/2020   HGBA1C 8.2 04/05/2019   HGBA1C 9.3 09/26/2018  10/2019: HbA1c 7.6%  11/08/2013: 9.5% 04/11/2013: 9.8%  Previously on: - Metformin 2000 mg daily in am. - Lantus 12 >> 14 units in am and 10 >> 14 units at bedtime >> Tresiba  - Humalog: 7-9 units depending on the size of the meal Inject Humalog 10-15 minutes before a meal. He was on Januvia which we stopped summer 2019. Stopped Tanzeum b/c N/V >> was in UC x2. Trulicity denied by insurance until he tried Victoza >> he did not want to try this.   Insulin pump:  -Medtronic 630G 07/2017 -At last visit, he was interested to switch to tandem T slim pump, but he was advised that he could only do  so after 01/2019 -now Medtronic 770G 03/2019  CGM: -Freestyle libre 2 now  Insulin: -Humalog  Supplies: -Medtronic  He is now using his arm for infusion sets due to scar tissue on the abdomen. He tried to use his thighs, but this did not work well for him.  Pump settings: - basal rates: 12 am: 1.0 >> 1.2 units/h 6 am: 1.3 - ICR:   12 am: 1:6  4 pm: 1:4.5 10 pm: 1:7 - target: 90-120 - ISF:   12 am: 24  9 pm: 24 - Insulin on Board: 4 >> 3h  TDD from basal insulin: 45% (22.4 units) >> 45% (30 units) >> 50% TDD from bolus insulin: 55% (27.26 units) >> 55% (35 units) >> 50% TDD: 60-100 units - changes infusion site: Every 3 days - Meter: Bayer Contour   She is also on: - Metformin ER 2000 mg in a.m.  Pt.checks his sugars more than 4 times a day with his CGM.    Previously:   Previously:  Lowest: 41 at night >> .Marland KitchenMarland Kitchen75 (after exercise) >> 50s during the night >> 70s Highest: 360 >> 400 >> 320 >> >350  In the past, his sugars are dropping after exercise and he was disconnecting the pump during exercise.  Pt's meals are: - Breakfast: cereal + 1% milk, with protein bar >> sometimes scrambled eggs or cereals - Lunch: sandwich - Dinner: varies - Snacks: 1-2 a day  -No CKD, BUN/creatinine: Lab Results  Component Value Date   BUN 14 04/30/2020   CREATININE 0.97 04/30/2020   Normal GFR: Lab Results  Component Value Date   GFRNONAA 88 04/30/2020   GFRNONAA 70 10/22/2018   GFRNONAA >60 06/30/2017   GFRNONAA 76 11/18/2016   GFRNONAA 87 01/15/2016   GFRNONAA 83 10/13/2015   GFRNONAA 87 08/14/2015   GFRNONAA >90 05/20/2013   No MAU: Lab Results  Component Value Date   MICRALBCREAT 2.3 01/04/2018   MICRALBCREAT 1.3 11/18/2016   MICRALBCREAT 4.1 06/06/2014   MICRALBCREAT 5.3 12/19/2012   MICRALBCREAT 2.8 02/23/2011   MICRALBCREAT 10.2 05/28/2010   MICRALBCREAT 3.4 12/18/2008  On lisinopril.  -No HL; last set of lipids: Lab Results  Component Value Date    CHOL 202 (H) 04/30/2020   HDL 59 04/30/2020   LDLCALC 120 (H) 04/30/2020   LDLDIRECT 78 10/22/2018   TRIG 121 04/30/2020   CHOLHDL 3.4 04/30/2020   - last eye exam was 2020: No DR reportedly - He denies numbness and tingling in his feet.    He has OSA and uses a CPAP machine.  Reviewed latest TSH: Lab Results  Component Value Date   TSH 1.85 04/30/2020   ROS: Constitutional: + weight gain, then weight loss, + fatigue, no subjective hyperthermia, no subjective hypothermia Eyes: no blurry vision, no xerophthalmia ENT: no sore throat, no nodules palpated in neck, no dysphagia, no odynophagia, no hoarseness Cardiovascular: no CP/no SOB/no palpitations/no leg swelling Respiratory: no cough/no SOB/no wheezing Gastrointestinal: no N/no V/no D/no C Musculoskeletal: no muscle aches/no joint aches Skin: no rashes Neurological: no tremors/no numbness/no tingling/no dizziness  I reviewed pt's medications, allergies, PMH, social hx, family hx, and changes were documented in the history of present illness. Otherwise, unchanged from my initial visit note.  Past Medical History:  Diagnosis Date   Depression    Diabetes mellitus    type II   GERD (gastroesophageal reflux disease)    Headache(784.0)    Hypogonadism male    Sleep apnea    Past Surgical History:  Procedure Laterality Date   NASAL SINUS SURGERY  04/1991   TONSILLECTOMY  1972   Social History   Socioeconomic History   Marital status: Married    Spouse name: Oncologist   Number of children: 2   Years of education: Not on file   Highest education level: Not on file  Occupational History    Employer: [employer]    Comment: Mental Health Case Management  Tobacco Use   Smoking status: Never   Smokeless tobacco: Never  Vaping Use   Vaping Use: Never used  Substance and Sexual Activity   Alcohol use: No   Drug use: No   Sexual activity: Not on file  Other Topics Concern   Not on file  Social History Narrative    He lives with wife and two children.   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   Highest level of education: master degree   Caffeine- diet soda 2 L a day   Social Determinants of Health   Financial Resource Strain: Not on file  Food Insecurity: Not on file  Transportation Needs: Not on file  Physical Activity: Not on file  Stress: Not on file  Social Connections: Not on file  Intimate Partner Violence: Not on file   Current Outpatient Medications on File Prior to Visit  Medication Sig Dispense Refill   Alcohol Swabs (ALCOHOL PREPS) PADS Use alcohol pads to clean site to be  used to check blood sugar. 100 each 11   AMBULATORY NON FORMULARY MEDICATION Change CPAP setting to 13.5 (14 is ok if 13.5 is not available). Please provide new machine as old machine was not fixable and Mr Cush has improved AHI with new machine. 1 each 0   Blood Glucose Monitoring Suppl (CONTOUR NEXT MONITOR) w/Device KIT 1 Device by Does not apply route daily. Use to test blood sugar daily 1 kit 1   Continuous Blood Gluc Sensor (FREESTYLE LIBRE 14 DAY SENSOR) MISC CHANGE EVERY 14 DAYS AS DIRECTED 2 each 0   COVID-19 At Home Antigen Test KIT USE AS DIRECTED 4 kit 0   FLUoxetine (PROZAC) 40 MG capsule TAKE 1 CAPSULE BY MOUTH ONCE DAILY 90 capsule 1   Glucagon 3 MG/DOSE POWD PLACE 3 MG INTO THE NOSE ONCE AS NEEDED FOR UP TO 1 DOSE. 2 each 11   glucose blood (CONTOUR NEXT TEST) test strip Use as instructed to check blood sugar 5 times daily. 450 each 12   glucose blood test strip USE TO CHECK BLOOD SUGAR 5 TO 6 TIMES A DAY     HYDROcodone bit-homatropine (HYCODAN) 5-1.5 MG/5ML syrup Take 5 mLs by mouth every 8 (eight) hours as needed for cough. 120 mL 0   Insulin Lispro-aabc (LYUMJEV) 100 UNIT/ML SOLN Use up to 80 units a day in the insulin pump. 30 mL 11   Insulin Pen Needle (PEN NEEDLES) 32G X 4 MM MISC USE AS DIRECTED UP TO SIX TIMES DAILY 300 each 3   Insulin Pen Needle 32G X  4 MM MISC Use up to 6x a day 300 each 3   Lancets (FREESTYLE) lancets Use as instructed to check sugar 4 times daily. 400 each 5   metFORMIN (GLUCOPHAGE) 1000 MG tablet Take 1 tablet (1,000 mg total) by mouth 2 (two) times daily with a meal. 60 tablet 0   MULTIPLE VITAMIN PO Take 1 tablet by mouth daily.     nirmatrelvir/ritonavir EUA (PAXLOVID) TABS Patient GFR is 88.Take nirmatrelvir (150 mg) two tablets twice daily for 5 days and ritonavir (100 mg) one tablet twice daily for 5 days. 30 tablet 0   ondansetron (ZOFRAN ODT) 4 MG disintegrating tablet Take 1 tablet (4 mg total) by mouth every 8 (eight) hours as needed for nausea or vomiting. 20 tablet 0   pantoprazole (PROTONIX) 40 MG tablet TAKE 1 TABLET BY MOUTH ONCE DAILY 90 tablet 3   rosuvastatin (CRESTOR) 20 MG tablet TAKE 1 TABLET (20 MG TOTAL) BY MOUTH DAILY. 90 tablet 3   [DISCONTINUED] ALPRAZolam (XANAX) 0.5 MG tablet Take 1 tablet (0.5 mg total) by mouth daily as needed for anxiety. 10 tablet 0   [DISCONTINUED] Topiramate ER (TROKENDI XR) 25 MG CP24 Take 1 tablet daily x1 week then increase to 2 tabs daily. (Patient not taking: Reported on 01/09/2020) 60 capsule 1   No current facility-administered medications on file prior to visit.   Allergies  Allergen Reactions   Cymbalta [Duloxetine Hcl] Other (See Comments)    Suicidal thoughts   Influenza Vaccines Other (See Comments)    Unknown reaction   Penicillins Other (See Comments)    Unknown - childhood allergy per mother   Ace Inhibitors Swelling    Angioedema with lisinopril   Pioglitazone Other (See Comments)      headache   Promethazine Hcl Nausea And Vomiting        Reglan [Metoclopramide] Other (See Comments)    Jittery, increased anxiety   Family  History  Adopted: Yes  Problem Relation Age of Onset   Cancer Father 66       Prostate cancer   Healthy Mother    Diabetes Mellitus I Son     PE: BP 120/80   Pulse 79   Ht 6' (1.829 m)   Wt 217 lb 3.2 oz (98.5 kg)    SpO2 96%   BMI 29.46 kg/m  Body mass index is 29.46 kg/m.  Wt Readings from Last 3 Encounters:  09/18/20 217 lb 3.2 oz (98.5 kg)  08/11/20 220 lb (99.8 kg)  04/30/20 222 lb (100.7 kg)   Constitutional: overweight, in NAD Eyes: PERRLA, EOMI, no exophthalmos ENT: moist mucous membranes, no thyromegaly, no cervical lymphadenopathy Cardiovascular: RRR, No MRG Respiratory: CTA B Gastrointestinal: abdomen soft, NT, ND, BS+ Musculoskeletal: no deformities, strength intact in all 4, L foot in boot Skin: moist, warm, no rashes Neurological: no tremor with outstretched hands, DTR normal in all 4  ASSESSMENT: 1. DM1, uncontrolled, without complications - C-peptide of 3.67 (0.8-3.9), from 06/16/2005 ([facility]). No associated glucose reported. - son with DM1 (also on an insulin pump)  Component     Latest Ref Rng & Units 08/17/2017  Glutamic Acid Decarb Ab     <5 IU/mL 25 (H)  Glucose, Plasma     65 - 99 mg/dL 102 (H)  C-Peptide     0.80 - 3.85 ng/mL 0.14 (L)  ZNT8 Antibodies     U/mL <15  Islet Cell Ab     Neg:<1:1 Negative   2.  Overweight  PLAN:   1. Patient with longstanding, uncontrolled, type 1 diabetes, on insulin pump (Medtronic 770 G) and also metformin. -At last visit, his sugars were above target especially after meals, but even between meals.  He was preparing to start back on the previous diet.  We increased his basal rates at night at that time and discussed about trying to enter 50% of proteins as carbs if he is starting on a low-carb diet, as she was planning.  I also suggested to change from Humalog to Lyumjev, which he did, but does not feel that this made a difference. CGM interpretation: -At today's visit, we reviewed his CGM downloads: It appears that 36% of values are in target range (goal >70%), while 64% are higher than 180 (goal <25%), and 0% are lower than 70 (goal <4%).  The calculated average blood sugar is 205.  The projected HbA1c for the next 3 months  (GMI) is 8.2%. -Reviewing the CGM trends, it appears that his sugars increase after every meal, in a stepwise fashion.  Sugars after dinner are not particularly high, however, he has a steep increase after the snack at night.  He is trying to improve his diet but we discussed that if he does not eliminate the snack, he needs to enter the carbs and bolus for them.  There are no low blood sugars.  Sugars are decreasing over night. -Also, reviewing his pump downloads, it appears that he is not bolusing enough for meals.  He is getting 63% of his basal insulin from basal rates and only 37% from boluses.  Also, per review of individual days, he is entering very few carbs during the day, sometimes only once a day and bolusing sometimes only once a day I explained that this is not conducive to good control, and he needs to enter carbs every time he eats and also enter all of the carbs that he eats.  Moreover, if  he has a low-carb meal, he still has to enter 50% of his proteins as carbs. -We discussed about that he is now on the libre 2 CGM, which has alarms, but he is not getting the alarms.  Upon review of his application on his phone, he was using the old application for the Renick 14 and we installed the new libre 2 application on his phone so he can benefit from alarms. -We also discussed about how to do an insulin to carb ratio validation.  I explained the protocol and the importance of doing so.  I advised him to do this with every meal, since this will determine whether his insulin to carb ratios are adequate. -He is exercising at night.  I advised him to start with cardio if his sugars are on the higher side and to start with weights if the sugars are on the lower side. - I advised him to: Patient Instructions  Please continue: - Metformin 2000 mg in a.m.  Please change the pump settings: - basal rates: 12 am: 1.2 units/h 6 am: 1.3 - ICR:   12 am: 1:6  4 pm: 1:4.5 10 pm: 1:7 - target: 90-120 -  ISF:   12 am: 24  9 pm: 24 - Insulin on Board: 3h  Please enter 50% of the protein amount as carbs if you have a low carb meal.  Do an Insulin to carb ratio validation as we discussed.  Enter ALL the carbs in the pump.  Please return in 3-4 months.   - we checked his HbA1c: 9.4% (higher) - advised to check sugars at different times of the day - 4x a day, rotating check times - advised for yearly eye exams >> he is not UTD - return to clinic in 3-4 months  2. Overweight -He gained some weight in the last few months but he started to exercise approximately 3 weeks ago and lost 3 pounds.  I encouraged him to continue. -We will also continue metformin, which is weight stabilizing long-term.   Philemon Kingdom, MD PhD [facility] Endocrinology

## 2020-09-24 ENCOUNTER — Other Ambulatory Visit (HOSPITAL_BASED_OUTPATIENT_CLINIC_OR_DEPARTMENT_OTHER): Payer: Self-pay

## 2020-09-24 ENCOUNTER — Encounter: Payer: Self-pay | Admitting: Internal Medicine

## 2020-09-24 ENCOUNTER — Other Ambulatory Visit: Payer: Self-pay | Admitting: Internal Medicine

## 2020-09-24 MED ORDER — FREESTYLE LIBRE 14 DAY SENSOR MISC
3 refills | Status: DC
  Filled 2020-09-24: qty 6, 84d supply, fill #0

## 2020-09-25 ENCOUNTER — Other Ambulatory Visit (HOSPITAL_BASED_OUTPATIENT_CLINIC_OR_DEPARTMENT_OTHER): Payer: Self-pay

## 2020-09-25 ENCOUNTER — Other Ambulatory Visit (HOSPITAL_COMMUNITY): Payer: Self-pay

## 2020-09-28 ENCOUNTER — Emergency Department: Admit: 2020-09-28 | Discharge status: Absent without leave | Payer: 59

## 2020-09-29 ENCOUNTER — Other Ambulatory Visit: Payer: Self-pay | Admitting: Orthopedic Surgery

## 2020-09-29 ENCOUNTER — Emergency Department
Admission: RE | Admit: 2020-09-29 | Discharge: 2020-09-29 | Disposition: A | Discharge status: Home / Self Care | Payer: 59 | Source: Ambulatory Visit

## 2020-09-29 ENCOUNTER — Other Ambulatory Visit: Payer: Self-pay

## 2020-09-29 ENCOUNTER — Ambulatory Visit: Payer: 59 | Admitting: Internal Medicine

## 2020-09-29 ENCOUNTER — Emergency Department (INDEPENDENT_AMBULATORY_CARE_PROVIDER_SITE_OTHER): Payer: 59

## 2020-09-29 VITALS — BP 123/80 | HR 77 | Temp 98.0°F | Resp 20 | Ht 72.0 in | Wt 217.0 lb

## 2020-09-29 DIAGNOSIS — S82832A Other fracture of upper and lower end of left fibula, initial encounter for closed fracture: Secondary | ICD-10-CM | POA: Diagnosis not present

## 2020-09-29 DIAGNOSIS — W19XXXA Unspecified fall, initial encounter: Secondary | ICD-10-CM

## 2020-09-29 DIAGNOSIS — M25572 Pain in left ankle and joints of left foot: Secondary | ICD-10-CM | POA: Diagnosis not present

## 2020-09-29 DIAGNOSIS — S63641A Sprain of metacarpophalangeal joint of right thumb, initial encounter: Secondary | ICD-10-CM

## 2020-09-29 DIAGNOSIS — M25472 Effusion, left ankle: Secondary | ICD-10-CM

## 2020-09-29 DIAGNOSIS — M7989 Other specified soft tissue disorders: Secondary | ICD-10-CM | POA: Diagnosis not present

## 2020-09-29 MED ORDER — HYDROCODONE-ACETAMINOPHEN 5-325 MG PO TABS: 1.0000 | ORAL_TABLET | Freq: Three times a day (TID) | ORAL | 0 refills | Status: AC

## 2020-09-29 MED ORDER — IBUPROFEN 600 MG PO TABS
600.0000 mg | ORAL_TABLET | Freq: Once | ORAL | Status: AC
  Administered 2020-09-29: 600 mg via ORAL

## 2020-09-29 NOTE — ED Provider Notes (Signed)
[name]    CSN: [id number] Arrival date & time: 09/29/20  1742      History   Chief Complaint Chief Complaint  Patient presents with   Ankle Pain   Fall    HPI Nathaniel [name] is a 55 y.o. male.   Patient presents today for evaluation of left ankle pain following a fall.  Reports that yesterday he was climbing a ladder and got to the fourth or fifth rung when he lost his balance causing him to fall and the ladder fell on him.  He landed with majority of his weight on his left ankle but also fell down the hill and hit the back of his head.  He denies any loss of consciousness.  Denies associated amnesia surrounding event, nausea, vomiting, vision changes, headache, dizziness.  He does not take any blood thinning medications.  He reports primary symptom today is left ankle pain.  Reports pain is rated 9 on a 0-10 pain scale, localized to lateral left ankle with radiation posteriorly, described as aching/throbbing, worse with attempted ambulation or palpation, no leaving factors identified.  He has tried Tylenol with codeine that he had from a previous prescription with minimal improvement of symptoms.  He is also tried over-the-counter analgesics that were ineffective.  He denies any numbness, paresthesias, weakness.  He is having difficulty bearing weight but he is able to do so in a cam boot.  He does have a history of previous fracture to left ankle but is no longer followed by podiatry.   Past Medical History:  Diagnosis Date   Depression    Diabetes mellitus    type II   GERD (gastroesophageal reflux disease)    Headache(784.0)    Hypogonadism male    Sleep apnea     Patient Active Problem List   Diagnosis Date Noted   Hyperlipidemia 08/11/2020   COVID-19 08/11/2020   Closed avulsion fracture of left ankle 02/24/2020   Bruised toe 02/24/2020   GERD (gastroesophageal reflux disease) 11/21/2019   Well adult exam 04/30/2019   Pain in right knee 08/07/2018    Obstructive sleep apnea 06/09/2016   Type 1 diabetes mellitus, uncontrolled ([place]) 10/02/2015   Cervical radiculitis 01/25/2015   Neck pain 02/27/2013   Chronic headaches 09/28/2011   Seasonal and perennial allergic rhinitis 09/28/2011   Fatigue 12/16/2008    Past Surgical History:  Procedure Laterality Date   NASAL SINUS SURGERY  04/1991   TONSILLECTOMY  1972       Home Medications    Prior to Admission medications   Medication Sig Start Date End Date Taking? Authorizing Provider  HYDROcodone-acetaminophen (NORCO/VICODIN) 5-325 MG tablet Take 1 tablet by mouth every 8 (eight) hours for 3 days. 09/29/20 10/02/20 Yes Shakur Lembo K, PA-C  naproxen sodium (ALEVE) 220 MG tablet Take 220 mg by mouth.   Yes [provider]  Alcohol Swabs (ALCOHOL PREPS) PADS Use alcohol pads to clean site to be used to check blood sugar. 05/02/14   Philemon Kingdom, MD  AMBULATORY NON FORMULARY MEDICATION Change CPAP setting to 13.5 (14 is ok if 13.5 is not available). Please provide new machine as old machine was not fixable and Nathaniel [name] has improved AHI with new machine. 07/19/18   Gregor Hams, MD  Blood Glucose Monitoring Suppl (CONTOUR NEXT MONITOR) w/Device KIT 1 Device by Does not apply route daily. Use to test blood sugar daily 04/03/18   Philemon Kingdom, MD  Continuous Blood Gluc Sensor (FREESTYLE [place]  14 DAY SENSOR) MISC CHANGE EVERY 14 DAYS AS DIRECTED 09/24/20   Philemon Kingdom, MD  COVID-19 At Home Antigen Test KIT USE AS DIRECTED 05/21/20 05/21/21  Clementeen Graham, RPH  FLUoxetine (PROZAC) 40 MG capsule TAKE 1 CAPSULE BY MOUTH ONCE DAILY 07/29/20 07/29/21  Luetta Nutting, DO  Glucagon 3 MG/DOSE POWD PLACE 3 MG INTO THE NOSE ONCE AS NEEDED FOR UP TO 1 DOSE. 03/06/20 03/06/21  Philemon Kingdom, MD  glucose blood (CONTOUR NEXT TEST) test strip Use as instructed to check blood sugar 5 times daily. 09/12/17   Philemon Kingdom, MD  glucose blood test strip USE TO CHECK BLOOD SUGAR 5 TO 6 TIMES  A DAY 07/23/19   [provider]  Insulin Lispro-aabc (LYUMJEV) 100 UNIT/ML SOLN Use up to 80 units a day in the insulin pump. 07/10/20   Philemon Kingdom, MD  Insulin Pen Needle (PEN NEEDLES) 32G X 4 MM MISC USE AS DIRECTED UP TO SIX TIMES DAILY 06/05/20 06/05/21  Philemon Kingdom, MD  Insulin Pen Needle 32G X 4 MM MISC Use up to 6x a day 06/05/20   Philemon Kingdom, MD  Lancets (FREESTYLE) lancets Use as instructed to check sugar 4 times daily. 04/27/16   Philemon Kingdom, MD  metFORMIN (GLUCOPHAGE) 1000 MG tablet Take 1 tablet (1,000 mg total) by mouth 2 (two) times daily with a meal. 09/17/20 09/17/21  Philemon Kingdom, MD  MULTIPLE VITAMIN PO Take 1 tablet by mouth daily.    [provider]  ondansetron (ZOFRAN ODT) 4 MG disintegrating tablet Take 1 tablet (4 mg total) by mouth every 8 (eight) hours as needed for nausea or vomiting. 08/07/19   Lamptey, Myrene Galas, MD  rosuvastatin (CRESTOR) 20 MG tablet TAKE 1 TABLET (20 MG TOTAL) BY MOUTH DAILY. 05/01/20 05/01/21  Luetta Nutting, DO  ALPRAZolam Duanne Moron) 0.5 MG tablet Take 1 tablet (0.5 mg total) by mouth daily as needed for anxiety. 02/11/19 03/26/19  Silverio Decamp, MD  Topiramate ER (TROKENDI XR) 25 MG CP24 Take 1 tablet daily x1 week then increase to 2 tabs daily. Patient not taking: Reported on 01/09/2020 01/08/20 02/17/20  Luetta Nutting, DO    Family History Family History  Adopted: Yes  Problem Relation Age of Onset   Cancer Father 7       Prostate cancer   Diabetes Mellitus I Son     Social History Social History   Tobacco Use   Smoking status: Never   Smokeless tobacco: Never  Vaping Use   Vaping Use: Never used  Substance Use Topics   Alcohol use: No   Drug use: No     Allergies   Cymbalta [duloxetine hcl], Influenza vaccines, Penicillins, Ace inhibitors, Pioglitazone, Promethazine hcl, and Reglan [metoclopramide]   Review of Systems Review of Systems  Constitutional:  Positive for activity  change. Negative for appetite change, fatigue and fever.  Eyes:  Negative for photophobia and visual disturbance.  Respiratory:  Negative for cough and shortness of breath.   Cardiovascular:  Negative for chest pain.  Gastrointestinal:  Negative for abdominal pain, diarrhea, nausea and vomiting.  Musculoskeletal:  Positive for arthralgias, gait problem and joint swelling. Negative for myalgias.  Neurological:  Negative for dizziness, light-headedness and headaches.    Physical Exam Triage Vital Signs ED Triage Vitals  Enc Vitals Group     BP 09/29/20 1804 123/80     Pulse Rate 09/29/20 1804 77     Resp 09/29/20 1804 20     Temp 09/29/20 1804  98 F (36.7 C)     Temp Source 09/29/20 1804 Oral     SpO2 09/29/20 1804 99 %     Weight 09/29/20 1758 217 lb (98.4 kg)     Height 09/29/20 1758 6' (1.829 m)     Head Circumference --      Peak Flow --      Pain Score 09/29/20 1758 9     Pain Loc --      Pain Edu? --      Excl. in [place]? --    No data found.  Updated Vital Signs BP 123/80 (BP Location: Right Arm)   Pulse 77   Temp 98 F (36.7 C) (Oral)   Resp 20   Ht 6' (1.829 m)   Wt 217 lb (98.4 kg)   SpO2 99%   BMI 29.43 kg/m   Visual Acuity Right Eye Distance:   Left Eye Distance:   Bilateral Distance:    Right Eye Near:   Left Eye Near:    Bilateral Near:     Physical Exam Vitals reviewed.  Constitutional:      General: He is awake.     Appearance: Normal appearance. He is normal weight. He is not ill-appearing.     Comments: Very pleasant male appears stated age sitting comfortably in exam room in no acute distress  HENT:     Head: Normocephalic and atraumatic. No raccoon eyes, Battle's sign or contusion.     Right Ear: Tympanic membrane, ear canal and external ear normal. No hemotympanum. Tympanic membrane is not erythematous or bulging.     Left Ear: Tympanic membrane, ear canal and external ear normal. No hemotympanum. Tympanic membrane is not erythematous or  bulging.     Nose: Nose normal.     Mouth/Throat:     Tongue: Tongue does not deviate from midline.     Pharynx: Uvula midline. No oropharyngeal exudate or posterior oropharyngeal erythema.  Eyes:     Extraocular Movements: Extraocular movements intact.     Conjunctiva/sclera: Conjunctivae normal.     Pupils: Pupils are equal, round, and reactive to light.  Cardiovascular:     Rate and Rhythm: Normal rate and regular rhythm.     Pulses:          Dorsalis pedis pulses are 2+ on the left side.       Posterior tibial pulses are 2+ on the left side.     Heart sounds: Normal heart sounds, S1 normal and S2 normal. No murmur heard.    Comments: Capillary refill within 2 seconds left toes Pulmonary:     Effort: Pulmonary effort is normal. No accessory muscle usage or respiratory distress.     Breath sounds: Normal breath sounds. No stridor. No wheezing, rhonchi or rales.     Comments: Clear to auscultation bilaterally Musculoskeletal:     Cervical back: Normal range of motion and neck supple.     Left ankle: Swelling present. Tenderness present over the lateral malleolus. Decreased range of motion.     Comments: Strength 5/5 bilateral upper and lower extremities  Left ankle: Tender to palpation over lateral malleolus.  Foot neurovascularly intact.  Normal active range of motion of toes.  Decreased range of motion with inversion and eversion of ankle.  Feet:     Left foot:     Protective Sensation: 10 sites tested.  10 sites sensed.     Toenail Condition: Left toenails are abnormally thick.  Lymphadenopathy:  Head:     Right side of head: No submental, submandibular or tonsillar adenopathy.     Left side of head: No submental, submandibular or tonsillar adenopathy.     Cervical: No cervical adenopathy.  Neurological:     Mental Status: He is alert.  Psychiatric:        Behavior: Behavior is cooperative.     UC Treatments / Results  Labs (all labs ordered are listed, but only  abnormal results are displayed) Labs Reviewed - No data to display  EKG   Radiology DG Ankle Complete Left  Result Date: 09/29/2020 CLINICAL DATA:  Fall with swelling at the lateral malleolus. EXAM: LEFT ANKLE COMPLETE - 3+ VIEW COMPARISON:  February 17, 2020. FINDINGS: Minimally displaced avulsion type fracture of the distal fibula. Soft tissue swelling overlying the lateral malleolus. Medial clear space appears intact. IMPRESSION: Minimally displaced avulsion type fracture of the distal fibula. Electronically Signed   By: Dahlia Bailiff MD   On: 09/29/2020 19:29    Procedures Procedures (including critical care time)  Medications Ordered in UC Medications - No data to display  Initial Impression / Assessment and Plan / UC Course  I have reviewed the triage vital signs and the nursing notes.  Pertinent labs & imaging results that were available during my care of the patient were reviewed by me and considered in my medical decision making (see chart for details).      No indication for head CT based on [place] head CT rules.  X-ray obtained given lateral malleolus tenderness showed minimally displaced avulsion fracture of distal fibula.  Patient is already in a cam boot and has crutches at home.  He was encouraged to continue wearing cam boot and use crutches until evaluated by foot and ankle specialist.  He was given contact information for local podiatrist and encouraged to follow-up with them for ongoing management first thing tomorrow.  He was given 10 tablets of hydrocodone with instruction not to drive or drink alcohol this medication as drowsiness is a common side effect.  He can use Tylenol and ibuprofen for additional pain relief.  Discussed alarm symptoms that warrant emergent evaluation.  Strict return precautions given to which patient expressed understanding.  Final Clinical Impressions(s) / UC Diagnoses   Final diagnoses:  Fall, initial encounter  Acute left ankle pain   Closed avulsion fracture of distal end of left fibula, initial encounter     Discharge Instructions      You have a fracture of the end of your fibula on the outside of your ankle.  Please follow-up with foot and ankle specialist as we discussed.  Continue using your cam boot and I would also recommend using crutches if you are continuing to have pain until you are seen by specialist.  Alternate Tylenol and ibuprofen during the day and use hydrocodone at night.  Do not drive or drink alcohol with hydrocodone as it can make you sleepy.  If you have any sudden severe symptoms such as increased pain, numbness, tingling, inability to bear weight you need to go to the emergency room.     ED Prescriptions     Medication Sig Dispense Auth. Provider   HYDROcodone-acetaminophen (NORCO/VICODIN) 5-325 MG tablet Take 1 tablet by mouth every 8 (eight) hours for 3 days. 10 tablet Hovanes Hymas K, PA-C      I have reviewed the PDMP during this encounter.   Terrilee Croak, PA-C 09/29/20 1945

## 2020-09-29 NOTE — Discharge Instructions (Addendum)
You have a fracture of the end of your fibula on the outside of your ankle.  Please follow-up with foot and ankle specialist as we discussed.  Continue using your cam boot and I would also recommend using crutches if you are continuing to have pain until you are seen by specialist.  Alternate Tylenol and ibuprofen during the day and use hydrocodone at night.  Do not drive or drink alcohol with hydrocodone as it can make you sleepy.  If you have any sudden severe symptoms such as increased pain, numbness, tingling, inability to bear weight you need to go to the emergency room.

## 2020-09-29 NOTE — ED Triage Notes (Signed)
Pt presents to Urgent Care with c/o L ankle pain following injury yesterday. He reports falling from a ladder and twisting his L ankle, then falling and hitting the back of his head on the ground. No LOC, some dizziness. Pt denies HA and nausea.

## 2020-10-01 ENCOUNTER — Encounter: Payer: Self-pay | Admitting: Family Medicine

## 2020-10-06 ENCOUNTER — Other Ambulatory Visit: Payer: Self-pay

## 2020-10-06 ENCOUNTER — Ambulatory Visit: Payer: 59 | Admitting: Sports Medicine

## 2020-10-06 DIAGNOSIS — S82892A Other fracture of left lower leg, initial encounter for closed fracture: Secondary | ICD-10-CM

## 2020-10-06 NOTE — Patient Instructions (Signed)
It was a pleasure to see you today!  For your ankle: - Continue wearing the boot for at least 3 more weeks (4 weeks total) - Wear the ace wrap underneath the booth and in the evenings (not overnight) until the swelling comes down - Continue elevating your leg to the level of the heart, using ibuprofen/tylenol/ice for pain - Follow up in 4 weeks  Be Well,  Dr. Leary Roca

## 2020-10-06 NOTE — Progress Notes (Signed)
    SUBJECTIVE:   CHIEF COMPLAINT / HPI: f/u ankle injury  55 yo man presents for follow up from urgent care. 8 days ago patient was on a ladder on a hill when he fell off.  He does not remember the exact mechanism of injury because it happened so fast, but he immediately had ankle pain and swelling presented to an urgent care.  He brought a boot from home which she had for previous ankle injury, he has been wearing this consistently for the last 8 days.  X-ray reviewed by me demonstrates minimally displaced avulsion fracture of left lateral malleolus.  Patient has overall improved since the first few days.  He has been elevating the leg when he can, icing intermittently, taking Tylenol and ibuprofen for pain.  He is able to bear weight on the foot in the boot.  This that he has been doing more the last few days and has had more pain at night.  PERTINENT  PMH / PSH: Noncontributory  OBJECTIVE:   BP 116/69   Ht 6' (1.829 m)   Wt 217 lb (98.4 kg)   BMI 29.43 kg/m   Nursing note and vitals reviewed GEN: Age-appropriate, WM, resting comfortably in chair, NAD, obese, alert and at baseline Ankle/Foot, L: + visible swelling around lateral malleolus, no ecchymosis or obvious bony deformity. Range of motion is full in all directions. Mild tenderness of lateral malleolus; neurovascularly intact. Psych: Pleasant and appropriate  ASSESSMENT/PLAN:   Closed avulsion fracture of left ankle 55 yo man presents for evaluation of avulsion fracture of lateral malleolus, still with edema and tenderness. Remain in boot for 3 more weeks. Continue conservative tx with compression, elevation, ibuprofen/tylenol, and ice as often as possible. Can continue to bear weight as tolerated. Follow up in 4 weeks.     Shirlean Mylar, MD [facility]   Patient seen and evaluated with the resident.  I agree with the above plan of care.  X-rays show a nondisplaced avulsion fracture from the tip of  the left distal fibula.  Patient will remain in his cam walker for total of 4 weeks.  He will return to the office in 3 weeks for reevaluation.  Hopefully at that time we can progress him to a med spec brace or an Aircast and begin rehabilitation exercises.  I do not anticipate the need for follow-up x-rays unless he does not clinically improve.  Call with questions or concerns in the interim.

## 2020-10-07 NOTE — Assessment & Plan Note (Signed)
55 yo man presents for evaluation of avulsion fracture of lateral malleolus, still with edema and tenderness. Remain in boot for 3 more weeks. Continue conservative tx with compression, elevation, ibuprofen/tylenol, and ice as often as possible. Can continue to bear weight as tolerated. Follow up in 4 weeks.

## 2020-10-09 ENCOUNTER — Other Ambulatory Visit (HOSPITAL_BASED_OUTPATIENT_CLINIC_OR_DEPARTMENT_OTHER): Payer: Self-pay

## 2020-10-12 ENCOUNTER — Ambulatory Visit: Payer: 59 | Admitting: Family Medicine

## 2020-10-19 ENCOUNTER — Other Ambulatory Visit: Payer: Self-pay | Admitting: Internal Medicine

## 2020-10-19 ENCOUNTER — Other Ambulatory Visit (HOSPITAL_BASED_OUTPATIENT_CLINIC_OR_DEPARTMENT_OTHER): Payer: Self-pay

## 2020-10-19 DIAGNOSIS — E1065 Type 1 diabetes mellitus with hyperglycemia: Secondary | ICD-10-CM

## 2020-10-19 MED ORDER — METFORMIN HCL 1000 MG PO TABS
1000.0000 mg | ORAL_TABLET | Freq: Two times a day (BID) | ORAL | 0 refills | Status: DC
  Filled 2020-10-19: qty 60, 30d supply, fill #0

## 2020-10-29 ENCOUNTER — Other Ambulatory Visit: Payer: Self-pay

## 2020-10-29 DIAGNOSIS — S82892A Other fracture of left lower leg, initial encounter for closed fracture: Secondary | ICD-10-CM

## 2020-10-30 ENCOUNTER — Ambulatory Visit (INDEPENDENT_AMBULATORY_CARE_PROVIDER_SITE_OTHER): Payer: 59

## 2020-10-30 ENCOUNTER — Other Ambulatory Visit: Payer: Self-pay

## 2020-10-30 DIAGNOSIS — S82892D Other fracture of left lower leg, subsequent encounter for closed fracture with routine healing: Secondary | ICD-10-CM | POA: Diagnosis not present

## 2020-10-30 DIAGNOSIS — S82892A Other fracture of left lower leg, initial encounter for closed fracture: Secondary | ICD-10-CM

## 2020-10-30 DIAGNOSIS — S92152A Displaced avulsion fracture (chip fracture) of left talus, initial encounter for closed fracture: Secondary | ICD-10-CM | POA: Diagnosis not present

## 2020-11-02 ENCOUNTER — Encounter: Payer: Self-pay | Admitting: Internal Medicine

## 2020-11-03 ENCOUNTER — Ambulatory Visit: Payer: 59 | Admitting: Sports Medicine

## 2020-11-04 ENCOUNTER — Other Ambulatory Visit: Payer: Self-pay | Admitting: Internal Medicine

## 2020-11-04 ENCOUNTER — Other Ambulatory Visit (HOSPITAL_BASED_OUTPATIENT_CLINIC_OR_DEPARTMENT_OTHER): Payer: Self-pay

## 2020-11-04 MED ORDER — METFORMIN HCL ER 500 MG PO TB24
2000.0000 mg | ORAL_TABLET | Freq: Every day | ORAL | 3 refills | Status: DC
  Filled 2020-11-04: qty 360, 90d supply, fill #0
  Filled 2021-05-12: qty 360, 90d supply, fill #1

## 2020-11-05 ENCOUNTER — Other Ambulatory Visit: Payer: Self-pay

## 2020-11-05 ENCOUNTER — Ambulatory Visit: Payer: Managed Care, Other (non HMO) | Admitting: Sports Medicine

## 2020-11-05 ENCOUNTER — Encounter: Payer: Self-pay | Admitting: Family Medicine

## 2020-11-05 VITALS — Ht 72.0 in | Wt 217.0 lb

## 2020-11-05 DIAGNOSIS — S82892A Other fracture of left lower leg, initial encounter for closed fracture: Secondary | ICD-10-CM

## 2020-11-05 NOTE — Progress Notes (Signed)
   Subjective:    Patient ID: Nathaniel [name], male    DOB: 1965/09/30, 55 y.o.   MRN: [id number]  HPI  Nathaniel [name] presents today for follow-up on a nondisplaced left distal fibular avulsion fracture.  It has been 4 weeks since his last office visit.  He tried to wean from his cam walker into a med spec brace but when his pain returned he resumed wearing his cam walker.  He still has a mild amount of pain but it is improving.  He has not noticed any swelling.    Review of Systems As above    Objective:   Physical Exam  Well-developed, well-nourished.  No acute distress  Left ankle: Limited range of motion secondary to immobilization.  No effusion.  No soft tissue swelling.  There is no tenderness to palpation over the fracture site.  Ankle remains stable to talar tilt and anterior drawer testing.  Good pulses.  Ambulating without a significant limp.  X-rays of the left ankle including AP, lateral, and mortise views show a healing nondisplaced avulsion fracture of the distal fibula.      Assessment & Plan:   4 weeks status post healing nondisplaced avulsion fracture of the left distal fibula  Nathaniel [name] can wean from his cam walker into either a med spec brace or an Aircast.  He will start range of motion exercises and strengthening exercises at home.  He may slowly increase activity as tolerated.  Follow-up again in 4 weeks for reevaluation.  Hopefully we can plan on discharging him at that time.

## 2020-11-09 ENCOUNTER — Other Ambulatory Visit (HOSPITAL_BASED_OUTPATIENT_CLINIC_OR_DEPARTMENT_OTHER): Payer: Self-pay

## 2020-11-09 MED ORDER — FLUOXETINE HCL 40 MG PO CAPS
ORAL_CAPSULE | Freq: Every day | ORAL | 0 refills | Status: DC
  Filled 2020-11-09: qty 30, 30d supply, fill #0

## 2020-11-09 NOTE — Telephone Encounter (Signed)
Left voicemail message for patient letting him know that a 30 day refill has been sent but he needs a follow up appt scheduled with Dr Ashley Royalty for any further refills and to call us back to get this scheduled. AM

## 2020-11-11 ENCOUNTER — Encounter: Payer: Self-pay | Admitting: Internal Medicine

## 2020-11-13 ENCOUNTER — Other Ambulatory Visit (HOSPITAL_BASED_OUTPATIENT_CLINIC_OR_DEPARTMENT_OTHER): Payer: Self-pay

## 2020-11-13 MED FILL — Rosuvastatin Calcium Tab 20 MG: ORAL | 90 days supply | Qty: 90 | Fill #1 | Status: AC

## 2020-11-17 ENCOUNTER — Ambulatory Visit (INDEPENDENT_AMBULATORY_CARE_PROVIDER_SITE_OTHER): Payer: Managed Care, Other (non HMO) | Admitting: Sports Medicine

## 2020-11-17 ENCOUNTER — Ambulatory Visit (INDEPENDENT_AMBULATORY_CARE_PROVIDER_SITE_OTHER): Payer: Managed Care, Other (non HMO)

## 2020-11-17 ENCOUNTER — Other Ambulatory Visit: Payer: Self-pay

## 2020-11-17 DIAGNOSIS — M7522 Bicipital tendinitis, left shoulder: Secondary | ICD-10-CM

## 2020-11-17 HISTORY — DX: Bicipital tendinitis, left shoulder: M75.22

## 2020-11-17 MED ORDER — MELOXICAM 15 MG PO TABS: ORAL_TABLET | ORAL | 3 refills | Status: DC

## 2020-11-17 NOTE — Progress Notes (Signed)
    Procedures performed today:    None.  Independent interpretation of notes and tests performed by another provider:   None.  Brief History, Exam, Impression, and Recommendations:    Biceps tendinitis, left, distal This is a very pleasant 55 year old male, for the past 6 weeks has had pain in his anterior left elbow, worse with most motions. On exam there is no discrete areas of tenderness to palpation but he does have reproduction of pain with resisted supination. This is consistent with distal biceps tendinitis. We will start conservatively with meloxicam, biceps conditioning, x-rays, return to see me in 4 to 6 weeks, injection if no better.    ___________________________________________ Ihor Austin. Benjamin Stain, M.D., ABFM., CAQSM. Primary Care and Sports Medicine [facility]  Adjunct Instructor of Family Medicine  [employer]

## 2020-11-17 NOTE — Assessment & Plan Note (Signed)
This is a very pleasant 55 year old male, for the past 6 weeks has had pain in his anterior left elbow, worse with most motions. On exam there is no discrete areas of tenderness to palpation but he does have reproduction of pain with resisted supination. This is consistent with distal biceps tendinitis. We will start conservatively with meloxicam, biceps conditioning, x-rays, return to see me in 4 to 6 weeks, injection if no better.

## 2020-11-18 ENCOUNTER — Telehealth: Payer: Self-pay | Admitting: Nutrition

## 2020-11-18 NOTE — Telephone Encounter (Signed)
I offered to see this patient and to give him some different infusion sets, and to show him some other infusion sites.  Patient did not want to come in, saying he works 2 jobs and said he has used all available sites.  When I asked if he used his upper or side buttocks areas, he said no.  I talked him through finding appropriate sites on his upper and side buttocks areas, and told him I would mail him so different infusion sets.  Says he likes the one he has, and feels these are not the problem.  He was given my number to call after trying the new sites to let me know if these cause less bleeding or pain after the first day.

## 2020-11-18 NOTE — Telephone Encounter (Signed)
Thank you, Linda.  

## 2020-11-20 ENCOUNTER — Encounter: Payer: Self-pay | Admitting: Family Medicine

## 2020-11-20 DIAGNOSIS — J3089 Other allergic rhinitis: Secondary | ICD-10-CM

## 2020-11-20 DIAGNOSIS — J302 Other seasonal allergic rhinitis: Secondary | ICD-10-CM

## 2020-11-28 ENCOUNTER — Other Ambulatory Visit: Payer: Self-pay | Admitting: Endocrinology

## 2020-11-28 MED ORDER — LANTUS SOLOSTAR 100 UNIT/ML ~~LOC~~ SOPN: 25.0000 [IU] | PEN_INJECTOR | Freq: Every day | SUBCUTANEOUS | 0 refills | Status: DC

## 2020-11-30 ENCOUNTER — Telehealth: Payer: Self-pay | Admitting: Internal Medicine

## 2020-11-30 NOTE — Telephone Encounter (Signed)
Unable to contact pt by phone or leave a message. Pt sent Mychart message with new instructions.

## 2020-11-30 NOTE — Telephone Encounter (Signed)
Pt explained he took 26 units last night of generic Lantus this morning BS were in the 400 pt took 12 units of Humalog for breakfast and lunch blood sugar is currently 406. Please advise.

## 2020-11-30 NOTE — Telephone Encounter (Signed)
Pt states that he was told that his pump was waterproof by the people made it. Pt's pump is not working now . Pt states that he has been having to use the insulin shots and sugar is really high. Sugars have been 217-440 in the last 24 hours and wanted to let provider know

## 2020-12-03 ENCOUNTER — Encounter: Payer: Self-pay | Admitting: Family Medicine

## 2020-12-03 ENCOUNTER — Ambulatory Visit: Payer: Managed Care, Other (non HMO) | Admitting: Sports Medicine

## 2020-12-03 DIAGNOSIS — E1065 Type 1 diabetes mellitus with hyperglycemia: Secondary | ICD-10-CM

## 2020-12-09 ENCOUNTER — Encounter: Payer: Self-pay | Admitting: Family Medicine

## 2020-12-09 ENCOUNTER — Telehealth: Payer: Managed Care, Other (non HMO) | Admitting: Nurse Practitioner

## 2020-12-09 DIAGNOSIS — M545 Low back pain, unspecified: Secondary | ICD-10-CM

## 2020-12-09 MED ORDER — NAPROXEN 500 MG PO TABS: 500.0000 mg | ORAL_TABLET | Freq: Two times a day (BID) | ORAL | 1 refills | Status: DC

## 2020-12-09 MED ORDER — TIZANIDINE HCL 4 MG PO TABS: 4.0000 mg | ORAL_TABLET | Freq: Four times a day (QID) | ORAL | 0 refills | Status: DC | PRN

## 2020-12-09 NOTE — Progress Notes (Signed)
Virtual Visit Consent   Nathaniel [name], you are scheduled for a virtual visit with Nathaniel [name], [place], a [facility] provider, today.     Just as with appointments in the office, your consent must be obtained to participate.  Your consent will be active for this visit and any virtual visit you may have with one of our providers in the next 365 days.     If you have a MyChart account, a copy of this consent can be sent to you electronically.  All virtual visits are billed to your insurance company just like a traditional visit in the office.    As this is a virtual visit, video technology does not allow for your provider to perform a traditional examination.  This may limit your provider's ability to fully assess your condition.  If your provider identifies any concerns that need to be evaluated in person or the need to arrange testing (such as labs, EKG, etc.), we will make arrangements to do so.     Although advances in technology are sophisticated, we cannot ensure that it will always work on either your end or our end.  If the connection with a video visit is poor, the visit may have to be switched to a telephone visit.  With either a video or telephone visit, we are not always able to ensure that we have a secure connection.     I need to obtain your verbal consent now.   Are you willing to proceed with your visit today? YES   Nathaniel [name] has provided verbal consent on 12/09/2020 for a virtual visit (video or telephone).   Nathaniel Hassell Done, FNP   Date: 12/09/2020 1:27 PM   Virtual Visit via Video Note   I, Nathaniel [name], connected with Nathaniel [name] ([id number], 1965-12-25) on 12/09/20 at  1:30 PM EDT by a video-enabled telemedicine application and verified that I am speaking with the correct person using two identifiers.  Location: Patient: Virtual Visit Location Patient: Home Provider: Virtual Visit Location Provider: Mobile   I discussed the limitations  of evaluation and management by telemedicine and the availability of in person appointments. The patient expressed understanding and agreed to proceed.    History of Present Illness: Nathaniel [name] is a 55 y.o. who identifies as a male who was assigned male at birth, and is being seen today for back pain.  HPI: Patient calls in c/o back pain that started 2 months ago. He fell off a ladder in July and not sure if that has cauased issue or not. Pain is on lower right side. Rates pain 7/10. Pain is constant and doe snot radiate. Nothing including motrin and mobic helps the pain. Activity increases pain.    Review of Systems  Gastrointestinal:  Negative for constipation, diarrhea, nausea and vomiting.  Genitourinary:  Negative for dysuria, flank pain, frequency, hematuria and urgency.  Musculoskeletal:  Positive for back pain.  Psychiatric/Behavioral: Negative.     Problems:  Patient Active Problem List   Diagnosis Date Noted   Biceps tendinitis, left, distal 11/17/2020   Hyperlipidemia 08/11/2020   COVID-19 08/11/2020   Closed avulsion fracture of left ankle 02/24/2020   Bruised toe 02/24/2020   GERD (gastroesophageal reflux disease) 11/21/2019   Well adult exam 04/30/2019   Pain in right knee 08/07/2018   Obstructive sleep apnea 06/09/2016   Type 1 diabetes mellitus, uncontrolled ([place]) 10/02/2015   Cervical radiculitis 01/25/2015   Neck pain 02/27/2013   Chronic  headaches 09/28/2011   Seasonal and perennial allergic rhinitis 09/28/2011   Fatigue 12/16/2008    Allergies:  Allergies  Allergen Reactions   Cymbalta [Duloxetine Hcl] Other (See Comments)    Suicidal thoughts   Influenza Vaccines Other (See Comments)    Unknown reaction   Penicillins Other (See Comments)    Unknown - childhood allergy per mother   Ace Inhibitors Swelling    Angioedema with lisinopril   Pioglitazone Other (See Comments)      headache   Promethazine Hcl Nausea And Vomiting        Reglan  [Metoclopramide] Other (See Comments)    Jittery, increased anxiety   Medications:  Current Outpatient Medications:    metFORMIN (GLUCOPHAGE-XR) 500 MG 24 hr tablet, Take 4 tablets (2,000 mg total) by mouth daily with breakfast., Disp: 360 tablet, Rfl: 3   Alcohol Swabs (ALCOHOL PREPS) PADS, Use alcohol pads to clean site to be used to check blood sugar., Disp: 100 each, Rfl: 11   AMBULATORY NON FORMULARY MEDICATION, Change CPAP setting to 13.5 (14 is ok if 13.5 is not available). Please provide new machine as old machine was not fixable and Mr Cinquemani has improved AHI with new machine., Disp: 1 each, Rfl: 0   Blood Glucose Monitoring Suppl (CONTOUR NEXT MONITOR) w/Device KIT, 1 Device by Does not apply route daily. Use to test blood sugar daily, Disp: 1 kit, Rfl: 1   Continuous Blood Gluc Sensor (FREESTYLE LIBRE 14 DAY SENSOR) MISC, CHANGE EVERY 14 DAYS AS DIRECTED, Disp: 6 each, Rfl: 3   COVID-19 At Home Antigen Test KIT, USE AS DIRECTED, Disp: 4 kit, Rfl: 0   Glucagon 3 MG/DOSE POWD, PLACE 3 MG INTO THE NOSE ONCE AS NEEDED FOR UP TO 1 DOSE., Disp: 2 each, Rfl: 11   glucose blood (CONTOUR NEXT TEST) test strip, Use as instructed to check blood sugar 5 times daily., Disp: 450 each, Rfl: 12   glucose blood test strip, USE TO CHECK BLOOD SUGAR 5 TO 6 TIMES A DAY, Disp: , Rfl:    insulin glargine (LANTUS SOLOSTAR) 100 UNIT/ML Solostar Pen, Inject 25 Units into the skin daily., Disp: 15 mL, Rfl: 0   Insulin Lispro-aabc (LYUMJEV) 100 UNIT/ML SOLN, Use up to 80 units a day in the insulin pump., Disp: 30 mL, Rfl: 11   Insulin Pen Needle (PEN NEEDLES) 32G X 4 MM MISC, USE AS DIRECTED UP TO SIX TIMES DAILY, Disp: 300 each, Rfl: 3   Insulin Pen Needle 32G X 4 MM MISC, Use up to 6x a day, Disp: 300 each, Rfl: 3   Lancets (FREESTYLE) lancets, Use as instructed to check sugar 4 times daily., Disp: 400 each, Rfl: 5   meloxicam (MOBIC) 15 MG tablet, One tab PO qAM with a meal for 2 weeks, then daily prn pain.,  Disp: 30 tablet, Rfl: 3   MULTIPLE VITAMIN PO, Take 1 tablet by mouth daily., Disp: , Rfl:    ondansetron (ZOFRAN ODT) 4 MG disintegrating tablet, Take 1 tablet (4 mg total) by mouth every 8 (eight) hours as needed for nausea or vomiting., Disp: 20 tablet, Rfl: 0   rosuvastatin (CRESTOR) 20 MG tablet, TAKE 1 TABLET (20 MG TOTAL) BY MOUTH DAILY., Disp: 90 tablet, Rfl: 3  Observations/Objective: Patient is well-developed, well-nourished in no acute distress.  Resting comfortably  at home.  Head is normocephalic, atraumatic.  No labored breathing.  Speech is clear and coherent with logical content.  Patient is alert and oriented at baseline.  FROM of lumbar spine without pain (-) SLR bil  Assessment and Plan:  Nathaniel [name] in today with chief complaint of Back Pain   1. Acute right-sided low back pain without sciatica Moist heat Rest  No bending or stooping Meds ordered this encounter  Medications   naproxen (NAPROSYN) 500 MG tablet    Sig: Take 1 tablet (500 mg total) by mouth 2 (two) times daily with a meal.    Dispense:  60 tablet    Refill:  1    Order Specific Question:   Supervising Provider    Answer:   Sabra Heck, BRIAN [[id number]]   tiZANidine (ZANAFLEX) 4 MG tablet    Sig: Take 1 tablet (4 mg total) by mouth every 6 (six) hours as needed for muscle spasms.    Dispense:  30 tablet    Refill:  0    Order Specific Question:   Supervising Provider    Answer:   Sabra Heck, BRIAN [[id number]]   Cannot give steroids due to HGBA1c 9.6% at last check   Follow Up Instructions: I discussed the assessment and treatment plan with the patient. The patient was provided an opportunity to ask questions and all were answered. The patient agreed with the plan and demonstrated an understanding of the instructions.  A copy of instructions were sent to the patient via MyChart.  The patient was advised to call back or seek an in-person evaluation if the symptoms worsen or if the condition fails to  improve as anticipated.  Time:  I spent 12 minutes with the patient via telehealth technology discussing the above problems/concerns.    Nathaniel Hassell Done, FNP

## 2020-12-14 ENCOUNTER — Ambulatory Visit: Payer: Managed Care, Other (non HMO) | Admitting: Family Medicine

## 2020-12-14 ENCOUNTER — Other Ambulatory Visit: Payer: Self-pay

## 2020-12-14 ENCOUNTER — Other Ambulatory Visit (HOSPITAL_BASED_OUTPATIENT_CLINIC_OR_DEPARTMENT_OTHER): Payer: Self-pay

## 2020-12-14 ENCOUNTER — Encounter: Payer: Self-pay | Admitting: Family Medicine

## 2020-12-14 ENCOUNTER — Ambulatory Visit (INDEPENDENT_AMBULATORY_CARE_PROVIDER_SITE_OTHER): Payer: Managed Care, Other (non HMO)

## 2020-12-14 VITALS — BP 114/64 | HR 79 | Temp 98.5°F | Wt 227.1 lb

## 2020-12-14 DIAGNOSIS — M545 Low back pain, unspecified: Secondary | ICD-10-CM | POA: Diagnosis not present

## 2020-12-14 DIAGNOSIS — E1065 Type 1 diabetes mellitus with hyperglycemia: Secondary | ICD-10-CM | POA: Diagnosis not present

## 2020-12-14 DIAGNOSIS — J302 Other seasonal allergic rhinitis: Secondary | ICD-10-CM

## 2020-12-14 DIAGNOSIS — J3089 Other allergic rhinitis: Secondary | ICD-10-CM

## 2020-12-14 MED ORDER — PREDNISONE 20 MG PO TABS
20.0000 mg | ORAL_TABLET | Freq: Every day | ORAL | 0 refills | Status: AC
  Filled 2020-12-14: qty 5, 5d supply, fill #0

## 2020-12-14 MED ORDER — LEVOCETIRIZINE DIHYDROCHLORIDE 5 MG PO TABS
5.0000 mg | ORAL_TABLET | Freq: Every evening | ORAL | 3 refills | Status: DC
  Filled 2020-12-14: qty 30, 30d supply, fill #0

## 2020-12-14 MED ORDER — MONTELUKAST SODIUM 10 MG PO TABS
10.0000 mg | ORAL_TABLET | Freq: Every day | ORAL | 3 refills | Status: DC
Start: 2020-12-14 — End: 2021-01-05
  Filled 2020-12-14: qty 30, 30d supply, fill #0

## 2020-12-14 MED ORDER — AZELASTINE HCL 0.1 % NA SOLN
2.0000 | Freq: Two times a day (BID) | NASAL | 1 refills | Status: DC
  Filled 2020-12-14: qty 30, 25d supply, fill #0
  Filled 2021-09-03: qty 30, 25d supply, fill #1

## 2020-12-14 NOTE — Patient Instructions (Addendum)
For back pain: Low dose prednisone for 5 days. Monitor your blood sugar closely and adjust your sliding scale insulin as needed.  After finishing prednisone, restart the Naproxen (do not take at the same time as prednisone) Continue muscle relaxer as needed at bedtime Do home exercises on handout provided Physical Therapy will be calling you to schedule Continue heating pad 20 minutes 3-4 times per day (with layer in between to prevent burning skin) Follow-up with sports medicine in 4-6 weeks if not improving  For throat clearing/drainage: Switch to Xyzal and Singulair for allergy medicine Start Astelin nose spray, can take with Flonase if desired If not improving, can try Protonix trial again for possible reflux cause.  Follow-up if not improving - can consider Allergist or ENT referral

## 2020-12-14 NOTE — Progress Notes (Signed)
Acute Office Visit  Subjective:    Patient ID: Nathaniel [name], male    DOB: 1965/06/08, 55 y.o.   MRN: [id number]  Chief Complaint  Patient presents with   Back Pain   Laryngopharyngeal reflux    History of seasonal allergies.    Back Pain This is a new problem. The current episode started more than 1 month ago (few months, significantly worse about 2 weeks ago)). The problem occurs intermittently. The pain is present in the lumbar spine. The quality of the pain is described as aching. The pain does not radiate. The pain is at a severity of 3/10 (up to 8/10 when active). The pain is moderate. The symptoms are aggravated by sitting and standing. Pertinent negatives include no abdominal pain, bladder incontinence, chest pain, dysuria, fever, headaches, leg pain, numbness, paresthesias, pelvic pain, perianal numbness, tingling or weakness. He has tried NSAIDs and muscle relaxant for the symptoms. The treatment provided no relief.  Patient reports he feel off a ladder in July and fractured his L ankle - thinks he may have been over compensating with gait which may have contributed to back pain. Heating pad has helped temporarily ease his pain. Reports he has "pulled back out" about 15 years ago, but hasn't had any significant trouble since then. He had a virtual visit last week and was given naproxen and zanaflex for his symptoms.    Throat Clearing Constantly having to clear his throat. Has tried reflux meds for 3-4 months which did not help at all despite history of reflux. Also has history of environmental allergies year round - off and on with Allegra. Has had sinus surgery, shots. No recent infections, fevers, sinus pain, headache, n/v/d, lymphadenopathy, cough.   Past Medical History:  Diagnosis Date   Depression    Diabetes mellitus    type II   GERD (gastroesophageal reflux disease)    Headache(784.0)    Hypogonadism male    Sleep apnea     Past Surgical History:  Procedure  Laterality Date   NASAL SINUS SURGERY  04/1991   TONSILLECTOMY  1972    Family History  Adopted: Yes  Problem Relation Age of Onset   Cancer Father 25       Prostate cancer   Diabetes Mellitus I Son     Social History   Socioeconomic History   Marital status: Married    Spouse name: Oncologist   Number of children: 2   Years of education: Not on file   Highest education level: Not on file  Occupational History    Employer: [employer]    Comment: [employer]  Tobacco Use   Smoking status: Never   Smokeless tobacco: Never  Vaping Use   Vaping Use: Never used  Substance and Sexual Activity   Alcohol use: No   Drug use: No   Sexual activity: Not on file  Other Topics Concern   Not on file  Social History Narrative   He lives with wife and two children.   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   Highest level of education: master degree   Caffeine- diet soda 2 L a day   Social Determinants of Health   Financial Resource Strain: Not on file  Food Insecurity: Not on file  Transportation Needs: Not on file  Physical Activity: Not on file  Stress: Not on file  Social Connections: Not on file  Intimate Partner Violence: Not on file  Outpatient Medications Prior to Visit  Medication Sig Dispense Refill   Alcohol Swabs (ALCOHOL PREPS) PADS Use alcohol pads to clean site to be used to check blood sugar. 100 each 11   AMBULATORY NON FORMULARY MEDICATION Change CPAP setting to 13.5 (14 is ok if 13.5 is not available). Please provide new machine as old machine was not fixable and Mr Halls has improved AHI with new machine. 1 each 0   Blood Glucose Monitoring Suppl (CONTOUR NEXT MONITOR) w/Device KIT 1 Device by Does not apply route daily. Use to test blood sugar daily 1 kit 1   Continuous Blood Gluc Sensor (FREESTYLE LIBRE 14 DAY SENSOR) MISC CHANGE EVERY 14 DAYS AS DIRECTED 6 each 3   COVID-19 At Home Antigen Test  KIT USE AS DIRECTED 4 kit 0   Glucagon 3 MG/DOSE POWD PLACE 3 MG INTO THE NOSE ONCE AS NEEDED FOR UP TO 1 DOSE. 2 each 11   glucose blood (CONTOUR NEXT TEST) test strip Use as instructed to check blood sugar 5 times daily. 450 each 12   glucose blood test strip USE TO CHECK BLOOD SUGAR 5 TO 6 TIMES A DAY     insulin glargine (LANTUS SOLOSTAR) 100 UNIT/ML Solostar Pen Inject 25 Units into the skin daily. 15 mL 0   Insulin Lispro-aabc (LYUMJEV) 100 UNIT/ML SOLN Use up to 80 units a day in the insulin pump. 30 mL 11   Insulin Pen Needle (PEN NEEDLES) 32G X 4 MM MISC USE AS DIRECTED UP TO SIX TIMES DAILY 300 each 3   Insulin Pen Needle 32G X 4 MM MISC Use up to 6x a day 300 each 3   Lancets (FREESTYLE) lancets Use as instructed to check sugar 4 times daily. 400 each 5   meloxicam (MOBIC) 15 MG tablet One tab PO qAM with a meal for 2 weeks, then daily prn pain. 30 tablet 3   metFORMIN (GLUCOPHAGE-XR) 500 MG 24 hr tablet Take 4 tablets (2,000 mg total) by mouth daily with breakfast. 360 tablet 3   MULTIPLE VITAMIN PO Take 1 tablet by mouth daily.     naproxen (NAPROSYN) 500 MG tablet Take 1 tablet (500 mg total) by mouth 2 (two) times daily with a meal. 60 tablet 1   ondansetron (ZOFRAN ODT) 4 MG disintegrating tablet Take 1 tablet (4 mg total) by mouth every 8 (eight) hours as needed for nausea or vomiting. 20 tablet 0   rosuvastatin (CRESTOR) 20 MG tablet TAKE 1 TABLET (20 MG TOTAL) BY MOUTH DAILY. 90 tablet 3   tiZANidine (ZANAFLEX) 4 MG tablet Take 1 tablet (4 mg total) by mouth every 6 (six) hours as needed for muscle spasms. 30 tablet 0   No facility-administered medications prior to visit.    Allergies  Allergen Reactions   Cymbalta [Duloxetine Hcl] Other (See Comments)    Suicidal thoughts   Influenza Vaccines Other (See Comments)    Unknown reaction   Penicillins Other (See Comments)    Unknown - childhood allergy per mother   Ace Inhibitors Swelling    Angioedema with lisinopril    Pioglitazone Other (See Comments)      headache   Promethazine Hcl Nausea And Vomiting        Reglan [Metoclopramide] Other (See Comments)    Jittery, increased anxiety    Review of Symptoms All review of systems negative except what is listed in the HPI       Objective:    Physical Exam Vitals reviewed.  Constitutional:      Appearance: Normal appearance.  HENT:     Head: Normocephalic and atraumatic.     Nose: Nose normal.     Mouth/Throat:     Mouth: Mucous membranes are moist.     Pharynx: Oropharynx is clear.  Cardiovascular:     Rate and Rhythm: Normal rate and regular rhythm.     Pulses: Normal pulses.     Heart sounds: Normal heart sounds.  Pulmonary:     Effort: Pulmonary effort is normal.     Breath sounds: Normal breath sounds.  Musculoskeletal:        General: No swelling or tenderness. Normal range of motion.  Skin:    Findings: No rash.  Neurological:     Mental Status: He is alert and oriented to person, place, and time.  Psychiatric:        Mood and Affect: Mood normal.        Behavior: Behavior normal.        Thought Content: Thought content normal.        Judgment: Judgment normal.    BP 114/64 (BP Location: Left Arm, Patient Position: Sitting, Cuff Size: Large)   Pulse 79   Temp 98.5 F (36.9 C) (Oral)   Wt 227 lb 1.9 oz (103 kg)   BMI 30.80 kg/m  Wt Readings from Last 3 Encounters:  12/14/20 227 lb 1.9 oz (103 kg)  11/05/20 217 lb (98.4 kg)  10/06/20 217 lb (98.4 kg)    Health Maintenance Due  Topic Date Due   HIV Screening  Never done   Hepatitis C Screening  Never done   OPHTHALMOLOGY EXAM  07/19/2018   URINE MICROALBUMIN  04/04/2020   FOOT EXAM  04/29/2020    There are no preventive care reminders to display for this patient.   Lab Results  Component Value Date   TSH 1.85 04/30/2020   Lab Results  Component Value Date   WBC 5.9 04/30/2020   HGB 13.8 04/30/2020   HCT 42.8 04/30/2020   MCV 87.3 04/30/2020   PLT 247  04/30/2020   Lab Results  Component Value Date   NA 136 04/30/2020   K 4.4 04/30/2020   CO2 29 04/30/2020   GLUCOSE 153 (H) 04/30/2020   BUN 14 04/30/2020   CREATININE 0.97 04/30/2020   BILITOT 0.9 04/30/2020   ALKPHOS 69 04/06/2019   AST 19 04/30/2020   ALT 16 04/30/2020   PROT 7.1 04/30/2020   ALBUMIN 4.1 04/06/2019   CALCIUM 9.9 04/30/2020   ANIONGAP 13 06/30/2017   GFR 86.49 06/06/2014   Lab Results  Component Value Date   CHOL 202 (H) 04/30/2020   Lab Results  Component Value Date   HDL 59 04/30/2020   Lab Results  Component Value Date   LDLCALC 120 (H) 04/30/2020   Lab Results  Component Value Date   TRIG 121 04/30/2020   Lab Results  Component Value Date   CHOLHDL 3.4 04/30/2020   Lab Results  Component Value Date   HGBA1C 9.4 (A) 09/18/2020       Assessment & Plan:   1. Acute right-sided low back pain without sciatica Xray today Low dose prednisone for 5 days. Monitor your blood sugar closely and adjust your sliding scale insulin as needed.  After finishing prednisone, restart the Naproxen (do not take at the same time as prednisone) Continue muscle relaxer as needed at bedtime Do home exercises on handout provided Physical Therapy will be calling you  to schedule Continue heating pad 20 minutes 3-4 times per day (with layer in between to prevent burning skin) Follow-up with sports medicine in 4-6 weeks if not improving  - predniSONE (DELTASONE) 20 MG tablet; Take 1 tablet (20 mg total) by mouth daily with breakfast for 5 days.  Dispense: 5 tablet; Refill: 0 - DG Lumbar Spine Complete; Future - Ambulatory referral to Physical Therapy  2. Seasonal and perennial allergic rhinitis Switch to Xyzal and Singulair for allergy medicine Start Astelin nose spray, can take with Flonase if desired If not improving, can try Protonix trial again for possible reflux cause.  Follow-up if not improving - can consider Allergist or ENT referral  - montelukast  (SINGULAIR) 10 MG tablet; Take 1 tablet (10 mg total) by mouth at bedtime.  Dispense: 30 tablet; Refill: 3 - azelastine (ASTELIN) 0.1 % nasal spray; Place 2 sprays into both nostrils 2 (two) times daily. Use in each nostril as directed  Dispense: 30 mL; Refill: 1 - levocetirizine (XYZAL) 5 MG tablet; Take 1 tablet (5 mg total) by mouth every evening.  Dispense: 30 tablet; Refill: 3  3. Uncontrolled type 1 diabetes mellitus with hyperglycemia [facility]) Patient requesting repeat CMP. Waiting to get in with a new endocrinologist. - Comprehensive metabolic panel   Follow-up with sports medicine in 4-6 weeks  Lovena Le B. Olevia Bowens, DNP, FNP-C

## 2020-12-15 ENCOUNTER — Ambulatory Visit: Payer: Managed Care, Other (non HMO) | Admitting: Sports Medicine

## 2020-12-15 LAB — COMPREHENSIVE METABOLIC PANEL
AG Ratio: 1.6 (calc) (ref 1.0–2.5)
ALT: 28 U/L (ref 9–46)
AST: 27 U/L (ref 10–35)
Albumin: 4.5 g/dL (ref 3.6–5.1)
Alkaline phosphatase (APISO): 58 U/L (ref 35–144)
BUN: 14 mg/dL (ref 7–25)
CO2: 29 mmol/L (ref 20–32)
Calcium: 10 mg/dL (ref 8.6–10.3)
Chloride: 99 mmol/L (ref 98–110)
Creat: 1.12 mg/dL (ref 0.70–1.30)
Globulin: 2.8 g/dL (calc) (ref 1.9–3.7)
Glucose, Bld: 211 mg/dL — ABNORMAL HIGH (ref 65–99)
Potassium: 4.7 mmol/L (ref 3.5–5.3)
Sodium: 138 mmol/L (ref 135–146)
Total Bilirubin: 1 mg/dL (ref 0.2–1.2)
Total Protein: 7.3 g/dL (ref 6.1–8.1)

## 2020-12-16 ENCOUNTER — Other Ambulatory Visit (HOSPITAL_BASED_OUTPATIENT_CLINIC_OR_DEPARTMENT_OTHER): Payer: Self-pay

## 2020-12-16 ENCOUNTER — Encounter: Payer: Self-pay | Admitting: Family Medicine

## 2020-12-22 ENCOUNTER — Ambulatory Visit: Payer: 59 | Admitting: Internal Medicine

## 2020-12-28 ENCOUNTER — Encounter: Payer: Self-pay | Admitting: Internal Medicine

## 2020-12-30 ENCOUNTER — Ambulatory Visit: Payer: 59 | Admitting: Internal Medicine

## 2021-01-04 ENCOUNTER — Encounter: Payer: Self-pay | Admitting: Internal Medicine

## 2021-01-04 ENCOUNTER — Ambulatory Visit: Payer: Managed Care, Other (non HMO) | Admitting: Physical Therapy

## 2021-01-05 ENCOUNTER — Other Ambulatory Visit (HOSPITAL_BASED_OUTPATIENT_CLINIC_OR_DEPARTMENT_OTHER): Payer: Self-pay

## 2021-01-05 ENCOUNTER — Other Ambulatory Visit: Payer: Self-pay

## 2021-01-05 ENCOUNTER — Encounter: Payer: Self-pay | Admitting: Internal Medicine

## 2021-01-05 ENCOUNTER — Ambulatory Visit: Payer: Managed Care, Other (non HMO) | Admitting: Internal Medicine

## 2021-01-05 VITALS — BP 122/80 | HR 73 | Ht 72.0 in | Wt 229.2 lb

## 2021-01-05 DIAGNOSIS — Z23 Encounter for immunization: Secondary | ICD-10-CM

## 2021-01-05 DIAGNOSIS — E663 Overweight: Secondary | ICD-10-CM | POA: Diagnosis not present

## 2021-01-05 DIAGNOSIS — E1065 Type 1 diabetes mellitus with hyperglycemia: Secondary | ICD-10-CM

## 2021-01-05 LAB — POCT GLYCOSYLATED HEMOGLOBIN (HGB A1C): Hemoglobin A1C: 10 % — AB (ref 4.0–5.6)

## 2021-01-05 LAB — MICROALBUMIN / CREATININE URINE RATIO
Creatinine,U: 42.7 mg/dL
Microalb Creat Ratio: 1.6 mg/g (ref 0.0–30.0)
Microalb, Ur: <0.7 mg/dL (ref 0.0–1.9)

## 2021-01-05 MED ORDER — FREESTYLE LIBRE 3 SENSOR MISC
1.0000 | 3 refills | Status: DC
  Filled 2021-01-05: qty 6, 84d supply, fill #0
  Filled 2021-03-22: qty 6, 84d supply, fill #1
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
## 2021-01-05 NOTE — Patient Instructions (Addendum)
Please continue: - Metformin ER 2000 mg in a.m.  Please change the pump settings: - basal rates: 12 am: 1.5 >>> 1.6 units/h 6 am: 1.8 - ICR:   12 am: 1:6 >> 1:5  4 pm: 1:5 10 pm: 1:7 >> 1:6 - target: 90-140 >> 90-120 - ISF:   12 am: 22 >> 20  9 pm: 24 >> 20 - Insulin on Board: 3h  Please enter 50% of the protein amount as carbs if you have a low carb meal.  Do an Insulin to carb ratio validation as we discussed.  Enter ALL the carbs in the pump.  Try to do a dual wave bolus for fattier meals: 75%-25% over 2 hours.  Please return in 3-4 months.

## 2021-01-06 ENCOUNTER — Ambulatory Visit: Payer: Managed Care, Other (non HMO)

## 2021-01-06 ENCOUNTER — Other Ambulatory Visit (HOSPITAL_BASED_OUTPATIENT_CLINIC_OR_DEPARTMENT_OTHER): Payer: Self-pay

## 2021-01-12 ENCOUNTER — Encounter: Payer: Managed Care, Other (non HMO) | Admitting: Physical Therapy

## 2021-01-14 ENCOUNTER — Other Ambulatory Visit (HOSPITAL_COMMUNITY): Payer: Self-pay

## 2021-01-14 ENCOUNTER — Encounter: Payer: Managed Care, Other (non HMO) | Admitting: Physical Therapy

## 2021-01-19 ENCOUNTER — Ambulatory Visit: Payer: Managed Care, Other (non HMO) | Admitting: Sports Medicine

## 2021-01-19 ENCOUNTER — Ambulatory Visit (INDEPENDENT_AMBULATORY_CARE_PROVIDER_SITE_OTHER): Payer: Managed Care, Other (non HMO)

## 2021-01-19 ENCOUNTER — Other Ambulatory Visit: Payer: Self-pay

## 2021-01-19 ENCOUNTER — Encounter: Payer: Self-pay | Admitting: Internal Medicine

## 2021-01-19 DIAGNOSIS — M7522 Bicipital tendinitis, left shoulder: Secondary | ICD-10-CM

## 2021-01-19 DIAGNOSIS — M25522 Pain in left elbow: Secondary | ICD-10-CM

## 2021-01-19 NOTE — Progress Notes (Signed)
    Procedures performed today:    Procedure: Real-time Ultrasound Guided injection of the left distal biceps Device: Samsung HS60  Verbal informed consent obtained.  Time-out conducted.  Noted no overlying erythema, induration, or other signs of local infection.  Skin prepped in a sterile fashion.  Local anesthesia: Topical Ethyl chloride.  With sterile technique and under real time ultrasound guidance: Noted distal biceps sheath, normal-appearing distal biceps, advanced 25-gauge needle into the sheath and injected 1 cc kenalog 40, 1 cc lidocaine. Completed without difficulty  Advised to call if fevers/chills, erythema, induration, drainage, or persistent bleeding.  Images permanently stored and available for review in PACS.  Impression: Technically successful ultrasound guided injection.  Independent interpretation of notes and tests performed by another provider:   None.  Brief History, Exam, Impression, and Recommendations:    Biceps tendinitis, left, distal Persistent symptoms of bicipital tendinosis in spite of conservative treatment for 2 months now, meloxicam. Today we did a distal biceps sheath injection from a dorsal approach. Return to see me in 4 weeks for this. If insufficient improvement we will consider MRI. PRP injection is also a possibility for the future if this fails. We did explain that he may see some elevated blood sugars for the next several days. He will work with his endocrinologist on this, he does have an insulin pump.    ___________________________________________ Ihor Austin. Benjamin Stain, M.D., ABFM., CAQSM. Primary Care and Sports Medicine [facility]  Adjunct Instructor of Family Medicine  [employer]

## 2021-01-19 NOTE — Assessment & Plan Note (Addendum)
Persistent symptoms of bicipital tendinosis in spite of conservative treatment for 2 months now, meloxicam. Today we did a distal biceps sheath injection from a dorsal approach. Return to see me in 4 weeks for this. If insufficient improvement we will consider MRI. PRP injection is also a possibility for the future if this fails. We did explain that he may see some elevated blood sugars for the next several days. He will work with his endocrinologist on this, he does have an insulin pump.

## 2021-01-21 ENCOUNTER — Encounter: Payer: Managed Care, Other (non HMO) | Admitting: Physical Therapy

## 2021-01-27 ENCOUNTER — Ambulatory Visit: Payer: Managed Care, Other (non HMO) | Admitting: Sports Medicine

## 2021-01-28 ENCOUNTER — Encounter: Payer: Managed Care, Other (non HMO) | Admitting: Physical Therapy

## 2021-02-01 ENCOUNTER — Encounter: Payer: Managed Care, Other (non HMO) | Admitting: Physical Therapy

## 2021-02-03 ENCOUNTER — Other Ambulatory Visit (HOSPITAL_BASED_OUTPATIENT_CLINIC_OR_DEPARTMENT_OTHER): Payer: Self-pay

## 2021-02-16 ENCOUNTER — Ambulatory Visit: Payer: Managed Care, Other (non HMO) | Admitting: Sports Medicine

## 2021-02-17 ENCOUNTER — Other Ambulatory Visit (HOSPITAL_BASED_OUTPATIENT_CLINIC_OR_DEPARTMENT_OTHER): Payer: Self-pay

## 2021-02-17 MED FILL — Rosuvastatin Calcium Tab 20 MG: ORAL | 90 days supply | Qty: 90 | Fill #2 | Status: AC

## 2021-02-19 ENCOUNTER — Ambulatory Visit: Payer: Managed Care, Other (non HMO) | Admitting: Physician Assistant

## 2021-02-25 ENCOUNTER — Encounter: Payer: Self-pay | Admitting: Family Medicine

## 2021-03-01 ENCOUNTER — Telehealth: Payer: Managed Care, Other (non HMO) | Admitting: Family Medicine

## 2021-03-01 ENCOUNTER — Other Ambulatory Visit: Payer: Self-pay

## 2021-03-08 ENCOUNTER — Other Ambulatory Visit (HOSPITAL_BASED_OUTPATIENT_CLINIC_OR_DEPARTMENT_OTHER): Payer: Self-pay

## 2021-03-08 ENCOUNTER — Encounter: Payer: Self-pay | Admitting: Internal Medicine

## 2021-03-08 DIAGNOSIS — E1065 Type 1 diabetes mellitus with hyperglycemia: Secondary | ICD-10-CM

## 2021-03-08 MED ORDER — LYUMJEV 100 UNIT/ML IJ SOLN
INTRAMUSCULAR | 11 refills | Status: DC
Start: 2021-03-08 — End: 2021-06-04
  Filled 2021-03-08: qty 30, 34d supply, fill #0
  Filled 2021-04-27: qty 30, 38d supply, fill #0
  Filled 2021-06-04: qty 30, 38d supply, fill #1

## 2021-03-08 NOTE — Telephone Encounter (Signed)
Pt came by the office and was provided a sample of Insulin. New Rx sent to preferred pharmacy reflecting increased daily max.

## 2021-03-22 ENCOUNTER — Other Ambulatory Visit (HOSPITAL_BASED_OUTPATIENT_CLINIC_OR_DEPARTMENT_OTHER): Payer: Self-pay

## 2021-04-09 ENCOUNTER — Encounter: Payer: Self-pay | Admitting: Internal Medicine

## 2021-04-12 ENCOUNTER — Ambulatory Visit: Payer: Managed Care, Other (non HMO) | Admitting: Internal Medicine

## 2021-04-13 ENCOUNTER — Ambulatory Visit: Payer: Managed Care, Other (non HMO) | Admitting: Internal Medicine

## 2021-04-22 ENCOUNTER — Ambulatory Visit (INDEPENDENT_AMBULATORY_CARE_PROVIDER_SITE_OTHER): Payer: Managed Care, Other (non HMO) | Admitting: Family Medicine

## 2021-04-22 ENCOUNTER — Other Ambulatory Visit (HOSPITAL_BASED_OUTPATIENT_CLINIC_OR_DEPARTMENT_OTHER): Payer: Self-pay

## 2021-04-22 ENCOUNTER — Other Ambulatory Visit: Payer: Self-pay

## 2021-04-22 ENCOUNTER — Encounter: Payer: Self-pay | Admitting: Family Medicine

## 2021-04-22 VITALS — BP 120/75 | HR 77 | Ht 72.0 in | Wt 225.0 lb

## 2021-04-22 DIAGNOSIS — Z862 Personal history of diseases of the blood and blood-forming organs and certain disorders involving the immune mechanism: Secondary | ICD-10-CM | POA: Diagnosis not present

## 2021-04-22 DIAGNOSIS — R5382 Chronic fatigue, unspecified: Secondary | ICD-10-CM | POA: Diagnosis not present

## 2021-04-22 LAB — CBC WITH DIFFERENTIAL/PLATELET
Absolute Monocytes: 646 cells/uL (ref 200–950)
Basophils Absolute: 41 cells/uL (ref 0–200)
Basophils Relative: 0.6 %
Eosinophils Absolute: 170 cells/uL (ref 15–500)
Eosinophils Relative: 2.5 %
HCT: 41.1 % (ref 38.5–50.0)
Hemoglobin: 13.4 g/dL (ref 13.2–17.1)
Lymphs Abs: 1476 cells/uL (ref 850–3900)
MCH: 28 pg (ref 27.0–33.0)
MCHC: 32.6 g/dL (ref 32.0–36.0)
MCV: 85.8 fL (ref 80.0–100.0)
MPV: 10.9 fL (ref 7.5–12.5)
Monocytes Relative: 9.5 %
Neutro Abs: 4468 cells/uL (ref 1500–7800)
Neutrophils Relative %: 65.7 %
Platelets: 230 10*3/uL (ref 140–400)
RBC: 4.79 10*6/uL (ref 4.20–5.80)
RDW: 12.3 % (ref 11.0–15.0)
Total Lymphocyte: 21.7 %
WBC: 6.8 10*3/uL (ref 3.8–10.8)

## 2021-04-22 LAB — COMPLETE METABOLIC PANEL WITH GFR
AG Ratio: 1.5 (calc) (ref 1.0–2.5)
ALT: 28 U/L (ref 9–46)
AST: 23 U/L (ref 10–35)
Albumin: 4.2 g/dL (ref 3.6–5.1)
Alkaline phosphatase (APISO): 67 U/L (ref 35–144)
BUN: 10 mg/dL (ref 7–25)
CO2: 28 mmol/L (ref 20–32)
Calcium: 9.5 mg/dL (ref 8.6–10.3)
Chloride: 101 mmol/L (ref 98–110)
Creat: 1.08 mg/dL (ref 0.70–1.30)
Globulin: 2.8 g/dL (calc) (ref 1.9–3.7)
Glucose, Bld: 245 mg/dL — ABNORMAL HIGH (ref 65–99)
Potassium: 4.4 mmol/L (ref 3.5–5.3)
Sodium: 136 mmol/L (ref 135–146)
Total Bilirubin: 1 mg/dL (ref 0.2–1.2)
Total Protein: 7 g/dL (ref 6.1–8.1)
eGFR: 81 mL/min/{1.73_m2} (ref >=60)

## 2021-04-22 LAB — TESTOSTERONE: Testosterone: 549 ng/dL (ref 250–827)

## 2021-04-22 LAB — TSH: TSH: 1.52 mIU/L (ref 0.40–4.50)

## 2021-04-22 LAB — FERRITIN: Ferritin: 96 ng/mL (ref 38–380)

## 2021-04-22 MED ORDER — FLUOXETINE HCL 40 MG PO CAPS
40.0000 mg | ORAL_CAPSULE | Freq: Every day | ORAL | 1 refills | Status: DC
  Filled 2021-04-22: qty 90, 90d supply, fill #0
  Filled 2021-07-29: qty 90, 90d supply, fill #1

## 2021-04-22 NOTE — Progress Notes (Signed)
Nathaniel [name] - 56 y.o. male MRN [id number]  Date of birth: 10-31-1965  Subjective No chief complaint on file.   HPI Nathaniel [name] is a 56 year old male here today for follow-up visit.  Has complaint of increased fatigue.  Has history of poorly controlled diabetes, depression and OSA.  He is using CPAP regularly.  His father passed away in Feb 08, 2023 and has had some difficulty with this.  He did recently restart his fluoxetine for his depression.  He has been on this for a couple of months.  Tolerating current dose of fluoxetine, feels like he may need to increase this.  Blood sugars have not been well controlled.  He has had anemia in the past and would like to have updated labs.  ROS:  A comprehensive ROS was completed and negative except as noted per HPI  Allergies  Allergen Reactions   Cymbalta [Duloxetine Hcl] Other (See Comments)    Suicidal thoughts   Influenza Vaccines Other (See Comments)    Unknown reaction   Penicillins Other (See Comments)    Unknown - childhood allergy per mother   Ace Inhibitors Swelling    Angioedema with lisinopril   Pioglitazone Other (See Comments)      headache   Promethazine Hcl Nausea And Vomiting        Reglan [Metoclopramide] Other (See Comments)    Jittery, increased anxiety    Past Medical History:  Diagnosis Date   Depression    Diabetes mellitus    type II   GERD (gastroesophageal reflux disease)    Headache(784.0)    Hypogonadism male    Sleep apnea     Past Surgical History:  Procedure Laterality Date   NASAL SINUS SURGERY  04/1991   TONSILLECTOMY  1972    Social History   Socioeconomic History   Marital status: Married    Spouse name: Programmer, applications   Number of children: 2   Years of education: Not on file   Highest education level: Not on file  Occupational History    Employer: [employer]    Comment: Mental Health Case Management  Tobacco Use   Smoking status: Never   Smokeless tobacco: Never  Vaping Use   Vaping Use:  Never used  Substance and Sexual Activity   Alcohol use: No   Drug use: No   Sexual activity: Not on file  Other Topics Concern   Not on file  Social History Narrative   He lives with wife and two children.   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   Highest level of education: master degree   Caffeine- diet soda 2 L a day   Social Determinants of Health   Financial Resource Strain: Not on file  Food Insecurity: Not on file  Transportation Needs: Not on file  Physical Activity: Not on file  Stress: Not on file  Social Connections: Not on file    Family History  Adopted: Yes  Problem Relation Age of Onset   Cancer Father 88       Prostate cancer   Diabetes Mellitus I Son     Health Maintenance  Topic Date Due   HIV Screening  Never done   Hepatitis C Screening  Never done   Zoster Vaccines- Shingrix (1 of 2) Never done   OPHTHALMOLOGY EXAM  07/19/2018   COVID-19 Vaccine (4 - Booster for Moderna series) 03/16/2020   TETANUS/TDAP  03/01/2021   HEMOGLOBIN A1C  07/06/2021   URINE MICROALBUMIN  01/05/2022   FOOT EXAM  04/22/2022   Fecal DNA (Cologuard)  05/21/2023   INFLUENZA VACCINE  Completed   HPV VACCINES  Aged Out     ----------------------------------------------------------------------------------------------------------------------------------------------------------------------------------------------------------------- Physical Exam BP 120/75 (BP Location: Left Arm, Patient Position: Sitting, Cuff Size: Large)    Pulse 77    Ht 6' (1.829 m)    Wt 225 lb (102.1 kg)    SpO2 97%    BMI 30.52 kg/m   Physical Exam Constitutional:      Appearance: Normal appearance.  HENT:     Head: Normocephalic and atraumatic.  Eyes:     General: No scleral icterus. Cardiovascular:     Rate and Rhythm: Normal rate and regular rhythm.  Pulmonary:     Effort: Pulmonary effort is normal.     Breath sounds: Normal breath sounds.   Musculoskeletal:     Cervical back: Neck supple.  Neurological:     General: No focal deficit present.     Mental Status: He is alert.    ------------------------------------------------------------------------------------------------------------------------------------------------------------------------------------------------------------------- Assessment and Plan  Fatigue Fatigue is likely multifactorial however I think his poorly controlled blood sugars are a significant contributor.  He will continue to work with endocrinology for management of this.  Increasing his fluoxetine to 40 mg to see if this is helpful as well.  Updating labs today.   Meds ordered this encounter  Medications   FLUoxetine (PROZAC) 40 MG capsule    Sig: Take 1 capsule (40 mg total) by mouth daily.    Dispense:  90 capsule    Refill:  1    Return in about 3 months (around 07/20/2021) for Mood/Energy.    This visit occurred during the SARS-CoV-2 public health emergency.  Safety protocols were in place, including screening questions prior to the visit, additional usage of staff PPE, and extensive cleaning of exam room while observing appropriate contact time as indicated for disinfecting solutions.

## 2021-04-22 NOTE — Assessment & Plan Note (Signed)
Fatigue is likely multifactorial however I think his poorly controlled blood sugars are a significant contributor.  He will continue to work with endocrinology for management of this.  Increasing his fluoxetine to 40 mg to see if this is helpful as well.  Updating labs today.

## 2021-04-22 NOTE — Patient Instructions (Signed)
Increase fluoxetine to 40mg  daily.  We'll be in touch with labs.  See me again in 3 months.

## 2021-04-27 ENCOUNTER — Ambulatory Visit: Payer: Managed Care, Other (non HMO) | Admitting: Internal Medicine

## 2021-04-27 ENCOUNTER — Other Ambulatory Visit (HOSPITAL_BASED_OUTPATIENT_CLINIC_OR_DEPARTMENT_OTHER): Payer: Self-pay

## 2021-05-03 ENCOUNTER — Encounter: Payer: Self-pay | Admitting: Family Medicine

## 2021-05-03 ENCOUNTER — Encounter: Payer: Self-pay | Admitting: Neurology

## 2021-05-03 NOTE — Telephone Encounter (Signed)
error 

## 2021-05-12 ENCOUNTER — Other Ambulatory Visit (HOSPITAL_BASED_OUTPATIENT_CLINIC_OR_DEPARTMENT_OTHER): Payer: Self-pay

## 2021-05-28 ENCOUNTER — Telehealth: Payer: Self-pay

## 2021-05-28 DIAGNOSIS — R519 Headache, unspecified: Secondary | ICD-10-CM

## 2021-05-28 NOTE — Telephone Encounter (Signed)
Doc,  ? ?[facility] called because Mr. [name] wanted to schedule an appointment with them. They said the provider that had been seeing the patient had nothing else to offer him and recommended a referral to the [facility]. She said the referral should come from [place].  ? ?Very Respectfully,  ?Gwyndolyn Saxon  ?

## 2021-05-28 NOTE — Telephone Encounter (Signed)
Called patient and was notified of referral recommendation. He was agreeable to the referral.  ?

## 2021-05-28 NOTE — Telephone Encounter (Signed)
Referral entered  
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
## 2021-06-01 ENCOUNTER — Other Ambulatory Visit: Payer: Self-pay

## 2021-06-01 ENCOUNTER — Emergency Department (HOSPITAL_BASED_OUTPATIENT_CLINIC_OR_DEPARTMENT_OTHER)
Admission: EM | Admit: 2021-06-01 | Discharge: 2021-06-01 | Disposition: A | Discharge status: Home / Self Care | Payer: Managed Care, Other (non HMO) | Attending: Emergency Medicine | Admitting: Emergency Medicine

## 2021-06-01 ENCOUNTER — Other Ambulatory Visit: Payer: Self-pay | Admitting: Family Medicine

## 2021-06-01 ENCOUNTER — Other Ambulatory Visit (HOSPITAL_BASED_OUTPATIENT_CLINIC_OR_DEPARTMENT_OTHER): Payer: Self-pay

## 2021-06-01 ENCOUNTER — Emergency Department (HOSPITAL_BASED_OUTPATIENT_CLINIC_OR_DEPARTMENT_OTHER): Payer: Managed Care, Other (non HMO)

## 2021-06-01 ENCOUNTER — Encounter (HOSPITAL_BASED_OUTPATIENT_CLINIC_OR_DEPARTMENT_OTHER): Payer: Self-pay | Admitting: *Deleted

## 2021-06-01 ENCOUNTER — Encounter: Payer: Self-pay | Admitting: Internal Medicine

## 2021-06-01 ENCOUNTER — Ambulatory Visit: Payer: Managed Care, Other (non HMO) | Admitting: Internal Medicine

## 2021-06-01 VITALS — BP 120/80 | HR 101 | Ht 72.0 in | Wt 224.4 lb

## 2021-06-01 DIAGNOSIS — Z20822 Contact with and (suspected) exposure to covid-19: Secondary | ICD-10-CM | POA: Diagnosis not present

## 2021-06-01 DIAGNOSIS — Z7984 Long term (current) use of oral hypoglycemic drugs: Secondary | ICD-10-CM | POA: Diagnosis not present

## 2021-06-01 DIAGNOSIS — R002 Palpitations: Secondary | ICD-10-CM | POA: Insufficient documentation

## 2021-06-01 DIAGNOSIS — Z794 Long term (current) use of insulin: Secondary | ICD-10-CM | POA: Diagnosis not present

## 2021-06-01 DIAGNOSIS — E109 Type 1 diabetes mellitus without complications: Secondary | ICD-10-CM | POA: Insufficient documentation

## 2021-06-01 DIAGNOSIS — R0602 Shortness of breath: Secondary | ICD-10-CM | POA: Diagnosis not present

## 2021-06-01 DIAGNOSIS — E1065 Type 1 diabetes mellitus with hyperglycemia: Secondary | ICD-10-CM | POA: Diagnosis not present

## 2021-06-01 DIAGNOSIS — E860 Dehydration: Secondary | ICD-10-CM | POA: Insufficient documentation

## 2021-06-01 DIAGNOSIS — T7849XA Other allergy, initial encounter: Secondary | ICD-10-CM | POA: Diagnosis not present

## 2021-06-01 DIAGNOSIS — K219 Gastro-esophageal reflux disease without esophagitis: Secondary | ICD-10-CM | POA: Diagnosis not present

## 2021-06-01 DIAGNOSIS — E663 Overweight: Secondary | ICD-10-CM | POA: Diagnosis not present

## 2021-06-01 DIAGNOSIS — T50995A Adverse effect of other drugs, medicaments and biological substances, initial encounter: Secondary | ICD-10-CM

## 2021-06-01 LAB — RESP PANEL BY RT-PCR (FLU A&B, COVID) ARPGX2
Influenza A by PCR: NEGATIVE
Influenza B by PCR: NEGATIVE
SARS Coronavirus 2 by RT PCR: NEGATIVE

## 2021-06-01 LAB — COMPREHENSIVE METABOLIC PANEL
ALT: 28 U/L (ref 0–44)
AST: 27 U/L (ref 15–41)
Albumin: 4.2 g/dL (ref 3.5–5.0)
Alkaline Phosphatase: 59 U/L (ref 38–126)
Anion gap: 10 (ref 5–15)
BUN: 13 mg/dL (ref 6–20)
CO2: 27 mmol/L (ref 22–32)
Calcium: 9.8 mg/dL (ref 8.9–10.3)
Chloride: 101 mmol/L (ref 98–111)
Creatinine, Ser: 1.09 mg/dL (ref 0.61–1.24)
GFR, Estimated: >60 mL/min (ref >60)
Glucose, Bld: 77 mg/dL (ref 70–99)
Potassium: 4 mmol/L (ref 3.5–5.1)
Sodium: 138 mmol/L (ref 135–145)
Total Bilirubin: 1.1 mg/dL (ref 0.3–1.2)
Total Protein: 8.1 g/dL (ref 6.5–8.1)

## 2021-06-01 LAB — CBC
HCT: 41.4 % (ref 39.0–52.0)
Hemoglobin: 13.7 g/dL (ref 13.0–17.0)
MCH: 28.3 pg (ref 26.0–34.0)
MCHC: 33.1 g/dL (ref 30.0–36.0)
MCV: 85.5 fL (ref 80.0–100.0)
Platelets: 236 10*3/uL (ref 150–400)
RBC: 4.84 MIL/uL (ref 4.22–5.81)
RDW: 13.3 % (ref 11.5–15.5)
WBC: 6.7 10*3/uL (ref 4.0–10.5)
nRBC: 0 % (ref 0.0–0.2)

## 2021-06-01 LAB — URINALYSIS, ROUTINE W REFLEX MICROSCOPIC
Bilirubin Urine: NEGATIVE
Glucose, UA: 100 mg/dL — AB
Hgb urine dipstick: NEGATIVE
Ketones, ur: NEGATIVE mg/dL
Leukocytes,Ua: NEGATIVE
Nitrite: NEGATIVE
Protein, ur: NEGATIVE mg/dL
Specific Gravity, Urine: 1.01 (ref 1.005–1.030)
pH: 7 (ref 5.0–8.0)

## 2021-06-01 LAB — TROPONIN I (HIGH SENSITIVITY)
Troponin I (High Sensitivity): 5 ng/L (ref <18)
Troponin I (High Sensitivity): 5 ng/L (ref <18)

## 2021-06-01 LAB — POCT GLYCOSYLATED HEMOGLOBIN (HGB A1C): Hemoglobin A1C: 9.5 % — AB (ref 4.0–5.6)

## 2021-06-01 LAB — TSH: TSH: 1.819 u[IU]/mL (ref 0.350–4.500)

## 2021-06-01 LAB — CBG MONITORING, ED: Glucose-Capillary: 87 mg/dL (ref 70–99)

## 2021-06-01 MED ORDER — SODIUM CHLORIDE 0.9 % IV BOLUS
1000.0000 mL | Freq: Once | INTRAVENOUS | Status: AC
  Administered 2021-06-01: 1000 mL via INTRAVENOUS

## 2021-06-01 MED ORDER — PANTOPRAZOLE SODIUM 20 MG PO TBEC
20.0000 mg | DELAYED_RELEASE_TABLET | Freq: Every day | ORAL | 0 refills | Status: DC
  Filled 2021-06-01: qty 30, 30d supply, fill #0

## 2021-06-01 MED ORDER — DIPHENHYDRAMINE HCL 50 MG/ML IJ SOLN
25.0000 mg | Freq: Once | INTRAMUSCULAR | Status: AC
  Administered 2021-06-01: 25 mg via INTRAVENOUS
  Filled 2021-06-01: qty 1

## 2021-06-01 MED ORDER — IOHEXOL 350 MG/ML SOLN
100.0000 mL | Freq: Once | INTRAVENOUS | Status: AC | PRN
  Administered 2021-06-01: 100 mL via INTRAVENOUS

## 2021-06-01 NOTE — ED Triage Notes (Signed)
Pt. Complains of chest vibration a week ago/  Yesterday Pt. Felt same feeling yesterday with shortness of breath.  Pt. Pulse yesterday was 120s and then down to 80s after a skipped beat or poss. Pause per RN wife.  Pt. Today felt same feeling and shortness of breath with exertion such as even walking.  No nausea or sweating per Pt. ?

## 2021-06-01 NOTE — ED Provider Notes (Signed)
?[facility] EMERGENCY DEPARTMENT ?Provider Note ? ? ?CSN: [id number] ?Arrival date & time: 06/01/21  1054 ? ?  ? ?History ? ?Chief Complaint  ?Patient presents with  ? Palpitations  ? ? ?Nathaniel [name] is a 56 y.o. male. ? ?Pt is a 56 yo male with pmhx significant for dm1, depression, gerd, and sleep apnea.  Pt said he has felt a "chest vibration" for a week.  He has had some associated sob and palpitations.  His wife is an ED nurse.  She checked his HR and it was up to 120s.  Pt normally has a low resting HR.  Pt said he has been feeling more weak than usual.  No energy. Hgb A1c has been elevated.   ? ? ?  ? ?Home Medications ?Prior to Admission medications   ?Medication Sig Start Date End Date Taking? Authorizing Provider  ?pantoprazole (PROTONIX) 20 MG tablet Take 1 tablet (20 mg total) by mouth daily. 06/01/21  Yes Isla Pence, MD  ?Alcohol Swabs (ALCOHOL PREPS) PADS Use alcohol pads to clean site to be used to check blood sugar. 05/02/14   Philemon Kingdom, MD  ?AMBULATORY NON FORMULARY MEDICATION Change CPAP setting to 13.5 (14 is ok if 13.5 is not available). Please provide new machine as old machine was not fixable and Mr Hemric has improved AHI with new machine. 07/19/18   Gregor Hams, MD  ?azelastine (ASTELIN) 0.1 % nasal spray Place 2 sprays into both nostrils 2 (two) times daily. Use in each nostril as directed 12/14/20   Terrilyn Saver, NP  ?Blood Glucose Monitoring Suppl (CONTOUR NEXT MONITOR) w/Device KIT 1 Device by Does not apply route daily. Use to test blood sugar daily 04/03/18   Philemon Kingdom, MD  ?Continuous Blood Gluc Sensor (FREESTYLE LIBRE 3 SENSOR) MISC 1 each by Does not apply route every 14 (fourteen) days. 01/05/21   Philemon Kingdom, MD  ?FLUoxetine (PROZAC) 40 MG capsule Take 1 capsule (40 mg total) by mouth daily. 04/22/21   Luetta Nutting, DO  ?glucose blood (CONTOUR NEXT TEST) test strip Use as instructed to check blood sugar 5 times daily. 09/12/17   Philemon Kingdom,  MD  ?glucose blood test strip USE TO CHECK BLOOD SUGAR 5 TO 6 TIMES A DAY 07/23/19   [provider]  ?Insulin Lispro-aabc (LYUMJEV) 100 UNIT/ML SOLN Use up to 90 units a day in the insulin pump. 03/08/21   Philemon Kingdom, MD  ?Insulin Pen Needle (PEN NEEDLES) 32G X 4 MM MISC USE AS DIRECTED UP TO SIX TIMES DAILY 06/05/20 06/05/21  Philemon Kingdom, MD  ?Insulin Pen Needle 32G X 4 MM MISC Use up to 6x a day 06/05/20   Philemon Kingdom, MD  ?Lancets (FREESTYLE) lancets Use as instructed to check sugar 4 times daily. 04/27/16   Philemon Kingdom, MD  ?metFORMIN (GLUCOPHAGE-XR) 500 MG 24 hr tablet Take 4 tablets (2,000 mg total) by mouth daily with breakfast. 11/04/20   Philemon Kingdom, MD  ?MULTIPLE VITAMIN PO Take 1 tablet by mouth daily.    [provider]  ?ondansetron (ZOFRAN ODT) 4 MG disintegrating tablet Take 1 tablet (4 mg total) by mouth every 8 (eight) hours as needed for nausea or vomiting. 08/07/19   Lamptey, Myrene Galas, MD  ?rosuvastatin (CRESTOR) 20 MG tablet TAKE 1 TABLET (20 MG TOTAL) BY MOUTH DAILY. 05/01/20 05/18/21  Luetta Nutting, DO  ?   ? ?Allergies    ?Cymbalta [duloxetine hcl], Influenza vaccines, Penicillins, Ace inhibitors, Iodinated contrast media,  Pioglitazone, Promethazine hcl, and Reglan [metoclopramide]   ? ?Review of Systems   ?Review of Systems  ?Cardiovascular:  Positive for palpitations.  ?Neurological:  Positive for weakness.  ?All other systems reviewed and are negative. ? ?Physical Exam ?Updated Vital Signs ?BP 126/78   Pulse 84   Temp 98.2 ?F (36.8 ?C) (Oral)   Resp (!) 24   SpO2 99%  ?Physical Exam ?Vitals and nursing note reviewed.  ?Constitutional:   ?   Appearance: Normal appearance.  ?HENT:  ?   Head: Normocephalic and atraumatic.  ?   Right Ear: External ear normal.  ?   Left Ear: External ear normal.  ?   Nose: Nose normal.  ?   Mouth/Throat:  ?   Mouth: Mucous membranes are dry.  ?Eyes:  ?   Extraocular Movements: Extraocular movements intact.  ?    Conjunctiva/sclera: Conjunctivae normal.  ?   Pupils: Pupils are equal, round, and reactive to light.  ?Cardiovascular:  ?   Rate and Rhythm: Normal rate and regular rhythm.  ?   Pulses: Normal pulses.  ?   Heart sounds: Normal heart sounds.  ?Pulmonary:  ?   Effort: Pulmonary effort is normal.  ?   Breath sounds: Normal breath sounds.  ?Abdominal:  ?   General: Abdomen is flat. Bowel sounds are normal.  ?   Palpations: Abdomen is soft.  ?Musculoskeletal:     ?   General: Normal range of motion.  ?   Cervical back: Normal range of motion and neck supple.  ?Skin: ?   General: Skin is warm.  ?   Capillary Refill: Capillary refill takes less than 2 seconds.  ?Neurological:  ?   General: No focal deficit present.  ?   Mental Status: He is alert and oriented to person, place, and time.  ?Psychiatric:     ?   Mood and Affect: Mood normal.     ?   Behavior: Behavior normal.  ? ? ?ED Results / Procedures / Treatments   ?Labs ?(all labs ordered are listed, but only abnormal results are displayed) ?Labs Reviewed  ?URINALYSIS, ROUTINE W REFLEX MICROSCOPIC - Abnormal; Notable for the following components:  ?    Result Value  ? Glucose, UA 100 (*)   ? All other components within normal limits  ?RESP PANEL BY RT-PCR (FLU A&B, COVID) ARPGX2  ?CBC  ?COMPREHENSIVE METABOLIC PANEL  ?TSH  ?CBG MONITORING, ED  ?TROPONIN I (HIGH SENSITIVITY)  ?TROPONIN I (HIGH SENSITIVITY)  ? ? ?EKG ?EKG Interpretation ? ?Date/Time:  Tuesday June 01 2021 11:06:54 EDT ?Ventricular Rate:  92 ?PR Interval:  180 ?QRS Duration: 84 ?QT Interval:  350 ?QTC Calculation: 433 ?R Axis:   55 ?Text Interpretation: Sinus rhythm No significant change since last tracing Confirmed by Isla Pence [phone]) on 06/01/2021 11:11:51 AM ? ?Radiology ?CT ABDOMEN PELVIS WO CONTRAST ? ?Result Date: 06/01/2021 ?CLINICAL DATA:  Palpitations and shortness of breath. EXAM: CT ABDOMEN AND PELVIS WITHOUT CONTRAST TECHNIQUE: Multidetector CT imaging of the abdomen and pelvis was  performed following the standard protocol without IV contrast. RADIATION DOSE REDUCTION: This exam was performed according to the departmental dose-optimization program which includes automated exposure control, adjustment of the mA and/or kV according to patient size and/or use of iterative reconstruction technique. COMPARISON:  None. FINDINGS: Lower chest: No acute abnormality. Hepatobiliary: Subcentimeter low-density lesion in the left liver, too small to characterize. No other focal liver abnormality. The gallbladder is unremarkable. No biliary dilatation. Pancreas: Unremarkable. No  pancreatic ductal dilatation or surrounding inflammatory changes. Spleen: Normal in size without focal abnormality. Adrenals/Urinary Tract: Adrenal glands are unremarkable. Excreted contrast within both renal collecting systems, ureters, and bladder. No renal mass or hydronephrosis. Stomach/Bowel: Stomach is within normal limits. Appendix appears normal. No evidence of bowel wall thickening, distention, or inflammatory changes. Vascular/Lymphatic: No significant vascular findings are present. No enlarged abdominal or pelvic lymph nodes. Reproductive: Prostate is unremarkable. Other: No abdominal wall hernia or abnormality. No abdominopelvic ascites. No pneumoperitoneum. Musculoskeletal: No acute or significant osseous findings. IMPRESSION: 1. No acute intra-abdominal process. Electronically Signed   By: Titus Dubin M.D.   On: 06/01/2021 15:06  ? ?CT Angio Chest PE W and/or Wo Contrast ? ?Result Date: 06/01/2021 ?CLINICAL DATA:  Chest pain and shortness of breath. EXAM: CT ANGIOGRAPHY CHEST WITH CONTRAST TECHNIQUE: Multidetector CT imaging of the chest was performed using the standard protocol during bolus administration of intravenous contrast. Multiplanar CT image reconstructions and MIPs were obtained to evaluate the vascular anatomy. RADIATION DOSE REDUCTION: This exam was performed according to the departmental  dose-optimization program which includes automated exposure control, adjustment of the mA and/or kV according to patient size and/or use of iterative reconstruction technique. CONTRAST:  150m OMNIPAQUE IOHEXOL 350 MG/ML SOLN COMP

## 2021-06-01 NOTE — Patient Instructions (Addendum)
Please continue: ?- Metformin ER 2000 mg in a.m. ? ?Please change the pump settings: ?- basal rates: ?12 am: 1.1 >> 1.4 units/h ?7 am: 1.7 ?9 pm: 1.4 ?- ICR:  ? 12 am: 1:5 ? 4 pm: 1:5 ?10 pm: 1:6 ?- target: 90-120 ?- ISF:  ? 12 am: 20 ? 9 pm: 20 ?- Insulin on Board: 3h ? ?Please enter 50% of the protein amount as carbs if you have a low carb meal. ? ?Enter ALL the carbs in the pump. ? ?Try to do a dual wave bolus for fattier meals: 75%-25% over 2 hours. ? ?Try to go to Urgent care or at least contact your PCP ASAP. ? ?Please return in 3-4 months.  ?

## 2021-06-01 NOTE — ED Notes (Signed)
Pt has started to itch and has noted hives on his abd. And torso area.  No Resp. Distress or trouble with swallowing.  Per pt. He is not having trouble with breathing or throat sensation. ?

## 2021-06-02 ENCOUNTER — Other Ambulatory Visit (HOSPITAL_BASED_OUTPATIENT_CLINIC_OR_DEPARTMENT_OTHER): Payer: Self-pay

## 2021-06-02 ENCOUNTER — Telehealth: Payer: Self-pay | Admitting: General Practice

## 2021-06-02 ENCOUNTER — Encounter: Payer: Self-pay | Admitting: Cardiology

## 2021-06-02 MED ORDER — ROSUVASTATIN CALCIUM 20 MG PO TABS
ORAL_TABLET | Freq: Every day | ORAL | 3 refills | Status: DC
  Filled 2021-06-02: qty 90, 90d supply, fill #0

## 2021-06-02 NOTE — Telephone Encounter (Signed)
Transition Care Management Unsuccessful Follow-up Telephone Call ? ?Date of discharge and from where:  06/01/21 from [facility] ? ?Attempts:  1st Attempt ? ?Reason for unsuccessful TCM follow-up call:  Left voice message ? ?  ?

## 2021-06-04 ENCOUNTER — Other Ambulatory Visit (HOSPITAL_BASED_OUTPATIENT_CLINIC_OR_DEPARTMENT_OTHER): Payer: Self-pay

## 2021-06-04 ENCOUNTER — Encounter: Payer: Self-pay | Admitting: Internal Medicine

## 2021-06-04 ENCOUNTER — Other Ambulatory Visit: Payer: Self-pay | Admitting: Internal Medicine

## 2021-06-04 DIAGNOSIS — E1065 Type 1 diabetes mellitus with hyperglycemia: Secondary | ICD-10-CM

## 2021-06-04 MED ORDER — LYUMJEV 100 UNIT/ML IJ SOLN
INTRAMUSCULAR | 3 refills | Status: DC
  Filled 2021-06-04: qty 90, fill #0
  Filled 2021-07-20: qty 90, 90d supply, fill #0
  Filled 2021-09-24 – 2021-10-04 (×2): qty 90, 90d supply, fill #1
  Filled 2022-01-07: qty 90, 90d supply, fill #2
  Filled 2022-03-31: qty 10, 10d supply, fill #2

## 2021-06-04 NOTE — Telephone Encounter (Signed)
Transition Care Management Unsuccessful Follow-up Telephone Call ? ?Date of discharge and from where:  06/01/21 from [facility] ? ?Attempts:  2nd Attempt ? ?Reason for unsuccessful TCM follow-up call:  Left voice message ? ?  ?

## 2021-06-07 NOTE — Telephone Encounter (Signed)
Transition Care Management Follow-up Telephone Call ?Date of discharge and from where: 06/01/21 from [facility] ?How have you been since you were released from the hospital? Doing better but still having some "vibration on left side of his chest. Happens every hour for 5-6 seconds and then goes away". He had EKG done, CT scans done while at the hospital and everything was negative.  ?Any questions or concerns? No ? ?Items Reviewed: ?Did the pt receive and understand the discharge instructions provided? Yes  ?Medications obtained and verified? No  ?Other? No  ?Any new allergies since your discharge? No  ?Dietary orders reviewed? Yes ?Do you have support at home? Yes  ? ?Home Care and Equipment/Supplies: ?Were home health services ordered? no ? ?Functional Questionnaire: (I = Independent and D = Dependent) ?ADLs: I ? ?Bathing/Dressing- I ? ?Meal Prep- I ? ?Eating- I ? ?Maintaining continence- I ? ?Transferring/Ambulation- I ? ?Managing Meds- I ? ?Follow up appointments reviewed: ? ?PCP Hospital f/u appt confirmed? No  Will call back after his appointment with Gastroenterologist. ?Specialist Hospital f/u appt confirmed? Yes  Scheduled to see Digestive specialist on 06/10/21. ?Are transportation arrangements needed? No  ?If their condition worsens, is the pt aware to call PCP or go to the Emergency Dept.? Yes ?Was the patient provided with contact information for the PCP's office or ED? Yes ?Was to pt encouraged to call back with questions or concerns? Yes  ?

## 2021-06-16 ENCOUNTER — Encounter: Payer: Self-pay | Admitting: *Deleted

## 2021-06-16 ENCOUNTER — Encounter: Payer: Self-pay | Admitting: Cardiology

## 2021-06-16 DIAGNOSIS — Z9641 Presence of insulin pump (external) (internal): Secondary | ICD-10-CM | POA: Insufficient documentation

## 2021-06-16 HISTORY — DX: Presence of insulin pump (external) (internal): Z96.41

## 2021-06-23 ENCOUNTER — Ambulatory Visit: Payer: 59 | Admitting: Neurology

## 2021-06-29 ENCOUNTER — Ambulatory Visit: Payer: Managed Care, Other (non HMO) | Admitting: Cardiology

## 2021-07-02 ENCOUNTER — Encounter: Payer: Self-pay | Admitting: Internal Medicine

## 2021-07-02 ENCOUNTER — Other Ambulatory Visit (HOSPITAL_BASED_OUTPATIENT_CLINIC_OR_DEPARTMENT_OTHER): Payer: Self-pay

## 2021-07-02 ENCOUNTER — Other Ambulatory Visit (HOSPITAL_COMMUNITY): Payer: Self-pay

## 2021-07-02 ENCOUNTER — Other Ambulatory Visit: Payer: Self-pay | Admitting: Internal Medicine

## 2021-07-02 DIAGNOSIS — E1065 Type 1 diabetes mellitus with hyperglycemia: Secondary | ICD-10-CM

## 2021-07-02 MED ORDER — FREESTYLE LIBRE 3 SENSOR MISC
2.0000 | 1 refills | Status: DC
  Filled 2021-07-02: qty 6, 84d supply, fill #0

## 2021-07-05 ENCOUNTER — Other Ambulatory Visit (HOSPITAL_BASED_OUTPATIENT_CLINIC_OR_DEPARTMENT_OTHER): Payer: Self-pay

## 2021-07-13 ENCOUNTER — Encounter: Payer: Self-pay | Admitting: Family Medicine

## 2021-07-15 ENCOUNTER — Other Ambulatory Visit (HOSPITAL_BASED_OUTPATIENT_CLINIC_OR_DEPARTMENT_OTHER): Payer: Self-pay

## 2021-07-15 MED ORDER — COVID-19 AT HOME ANTIGEN TEST VI KIT
PACK | 0 refills | Status: DC
  Filled 2021-07-15: qty 8, 8d supply, fill #0

## 2021-07-19 DIAGNOSIS — E291 Testicular hypofunction: Secondary | ICD-10-CM | POA: Insufficient documentation

## 2021-07-19 NOTE — Progress Notes (Signed)
?Cardiology Office Note:   ? ?Date:  07/20/2021  ? ?ID:  Nathaniel [name], DOB 04-28-1965, MRN [id number] ? ?PCP:  Luetta Nutting, DO  ?Cardiologist:  Shirlee More, MD  ? ?Referring MD: Otilio Miu,* ? ?ASSESSMENT:   ? ?1. Chest pain of uncertain etiology   ?2. Chest pain, unspecified type   ?3. Mixed hyperlipidemia   ? ?PLAN:   ? ?In order of problems listed above: ? ?Further evaluation for CAD high risk with type 1 diabetes cardiac CTA with steroid prep ?Continue current treatment for his diabetes insulin pump ?Look at his calcium score make a decision regarding lipid-lowering therapy ? ?Next appointment 2 months ? ? ?Medication Adjustments/Labs and Tests Ordered: ?Current medicines are reviewed at length with the patient today.  Concerns regarding medicines are outlined above.  ?Orders Placed This Encounter  ?Procedures  ? CT CORONARY MORPH W/CTA COR W/SCORE W/CA W/CM &/OR WO/CM  ? Basic Metabolic Panel (BMET)  ? EKG 12-Lead  ? ?Meds ordered this encounter  ?Medications  ? DISCONTD: predniSONE (DELTASONE) 50 MG tablet  ?  Sig: 1. Prednisone 50 mg - take 13 hours prior to test ?  2. Take another Prednisone 50 mg 7 hours prior to test ?  3. Take another Prednisone 50 mg 1 hour prior to test  ?  Dispense:  5 tablet  ?  Refill:  0  ? metoprolol tartrate (LOPRESSOR) 100 MG tablet  ?  Sig: Take 1 tablet (100 mg total) by mouth once for 1 dose.  ?  Dispense:  1 tablet  ?  Refill:  0  ? predniSONE (DELTASONE) 50 MG tablet  ?  Sig: Prednisone 50 mg - take 13 hours prior to test2. Take another Prednisone 50 mg 7 hours prior to test3. Take    another Prednisone 50 mg 1 hour prior to test  ?  Dispense:  5 tablet  ?  Refill:  0  ?  ? ?Chief Complaint  ?Patient presents with  ? Chest Pain  ? ? ? ?History of Present Illness:   ? ?Nathaniel [name] is a 56 y.o. male who is being seen today for the evaluation of chest pain at the request of Pleasant, Zettie Cooley,*. ? ?He was seen in [place] ED for palpitation  06/01/2021 his EKG showed sinus rhythm was interpreted as normal and no demonstrable arrhythmia in the emergency room.  Laboratory studies showed a creatinine 1.09 potassium normal 4.0 GFR greater than 60 cc his TSH was normal high-sensitivity troponin was low at 5 chest x-ray suggested minimal atelectasis repeat high-sensitivity troponin was unchanged CTA of the chest showed no findings of pulmonary embolism cardiovascular structures were normal without coronary or aortic atherosclerosis. ? ?I independently reviewed the EKG showing sinus rhythm and was normal including QT interval QRS morphology and conduction intervals ? ?He was previously seen by my partner Dr. Geraldo Pitter cardiology consultation 07/13/2017 with chest discomfort. ? ?He had a treadmill stress test performed that was low risk no findings of ischemia well-preserved exercise tolerance greater than 15 METS normal heart rate and blood pressure response achieving 85% of maximum predicted workload. ? ? ?He continues to have chest pain its not exertional and it comes and goes he describes as a mechanical feeling of vibration in the left precordium although its not typical angina he has multiple cardiovascular risk including his diabetes and hyper lipidemia.  We discussed further evaluation and he will undergo cardiac CTA he had a urticaria with  contrast dye will have a steroid prep and will give [place] the answer whether or not he has obstructive CAD.  Despite his dye allergy I think it is a good idea to do his he has had multiple medical interactions and ED visit. ?He is not having palpitation but I asked him to purchase a smart watch to monitor capture events with vibration of the chest and screen for atrial fibrillation ?Past Medical History:  ?Diagnosis Date  ? Biceps tendinitis, left, distal 11/17/2020  ? Cervical radiculitis 01/25/2015  ? Chronic headaches 09/28/2011  ? Closed avulsion fracture of left ankle 02/24/2020  ? COVID-19 08/11/2020  ? Depression    ? Diabetes mellitus   ? type II  ? Fatigue 12/16/2008  ? Qualifier: Diagnosis of  By: Wynona Luna   ? GERD (gastroesophageal reflux disease)   ? Headache(784.0)   ? Hyperlipidemia 08/11/2020  ? Hypogonadism male   ? Neck pain 02/27/2013  ? Obstructive sleep apnea 06/09/2016  ? Unattended Home Sleep Test 07/05/16-AHI 14.7/hour, desaturation to 79%, body weight 189 pounds.  AHI around 2.5 usually 04/23/18   ? Pain in right knee 08/07/2018  ? Presence of insulin pump (external) (internal) 06/16/2021  ? Seasonal and perennial allergic rhinitis 09/28/2011  ? Sleep apnea   ? CPAP  ? Type 1 diabetes mellitus, uncontrolled 10/02/2015  ? ? ?Past Surgical History:  ?Procedure Laterality Date  ? NASAL SINUS SURGERY  04/1991  ? TONSILLECTOMY  1972  ? ? ?Current Medications: ?Current Meds  ?Medication Sig  ? azelastine (ASTELIN) 0.1 % nasal spray Place 2 sprays into both nostrils 2 (two) times daily. Use in each nostril as directed  ? Continuous Blood Gluc Sensor (FREESTYLE LIBRE 3 SENSOR) MISC Apply on to the skin every 14 days.  ? COVID-19 At Home Antigen Test [facility] COVID-19 HOME TEST) KIT Use as directed per package instructions  ? FLUoxetine (PROZAC) 40 MG capsule Take 1 capsule (40 mg total) by mouth daily.  ? Insulin Lispro-aabc (LYUMJEV) 100 UNIT/ML SOLN Use up to 100 units a day in the insulin pump.  ? metFORMIN (GLUCOPHAGE-XR) 500 MG 24 hr tablet Take 4 tablets (2,000 mg total) by mouth daily with breakfast.  ? metoprolol tartrate (LOPRESSOR) 100 MG tablet Take 1 tablet (100 mg total) by mouth once for 1 dose.  ? MULTIPLE VITAMIN PO Take 1 tablet by mouth daily.  ? pantoprazole (PROTONIX) 20 MG tablet Take 20 mg by mouth 2 (two) times daily.  ? predniSONE (DELTASONE) 50 MG tablet Take 1 tablet by mouth for 5 days  ? predniSONE (DELTASONE) 50 MG tablet Prednisone 50 mg - take 13 hours prior to test2. Take another Prednisone 50 mg 7 hours prior to test3. Take    another Prednisone 50 mg 1 hour prior to test  ?  [DISCONTINUED] predniSONE (DELTASONE) 50 MG tablet 1. Prednisone 50 mg - take 13 hours prior to test ?  2. Take another Prednisone 50 mg 7 hours prior to test ?  3. Take another Prednisone 50 mg 1 hour prior to test  ?  ? ?Allergies:   Cymbalta [duloxetine hcl], Influenza vaccines, Penicillins, Tobacco, Ace inhibitors, Iodinated contrast media, Pioglitazone, Promethazine hcl, and Reglan [metoclopramide]  ? ?Social History  ? ?Socioeconomic History  ? Marital status: Married  ?  Spouse name: [name]  ? Number of children: 2  ? Years of education: Not on file  ? Highest education level: Not on file  ?Occupational History  ?  Employer:  [employer]  ?  Comment: Mental Health Case Management  ?Tobacco Use  ? Smoking status: Never  ?  Passive exposure: Never  ? Smokeless tobacco: Never  ?Vaping Use  ? Vaping Use: Never used  ?Substance and Sexual Activity  ? Alcohol use: No  ? Drug use: No  ? Sexual activity: Not on file  ?Other Topics Concern  ? Not on file  ?Social History Narrative  ? He lives with wife and two children.  ? 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  ? Highest level of education: master degree  ? Caffeine- diet soda 2 L a day  ? ?Social Determinants of Health  ? ?Financial Resource Strain: Not on file  ?Food Insecurity: Not on file  ?Transportation Needs: Not on file  ?Physical Activity: Not on file  ?Stress: Not on file  ?Social Connections: Not on file  ?  ? ?Family History: ?The patient's \family history includes Cancer (age of onset: 39) in his father; Diabetes Mellitus I in his son. He was adopted. ? ?ROS:   ?ROS Please see the history of present illness.    ? All other systems reviewed and are negative. ? ?EKGs/Labs/Other Studies Reviewed:   ? ?The following studies were reviewed today: ? ? ?EKG:  EKG is sinus rhythm and normal ordered today.  The ekg ordered today is personally reviewed and demonstrates sinus rhythm normal ? ?Recent Labs: ?06/01/2021: ALT 28;  BUN 13; Creatinine, Ser 1.09; Hemoglobin 13.7; Platelets 236; Potassium 4.0; Sodium 138; TSH 1.819  ?Recent Lipid Panel ?   ?Component Value Date/Time  ? CHOL 202 (H) 04/30/2020 0000  ? TRIG 121 02/10

## 2021-07-20 ENCOUNTER — Encounter: Payer: Self-pay | Admitting: Cardiology

## 2021-07-20 ENCOUNTER — Other Ambulatory Visit (HOSPITAL_BASED_OUTPATIENT_CLINIC_OR_DEPARTMENT_OTHER): Payer: Self-pay

## 2021-07-20 ENCOUNTER — Ambulatory Visit: Payer: Managed Care, Other (non HMO) | Admitting: Family Medicine

## 2021-07-20 ENCOUNTER — Encounter: Payer: Self-pay | Admitting: Family Medicine

## 2021-07-20 ENCOUNTER — Ambulatory Visit: Payer: Managed Care, Other (non HMO) | Admitting: Cardiology

## 2021-07-20 VITALS — BP 118/76 | HR 82 | Ht 72.0 in | Wt 227.0 lb

## 2021-07-20 DIAGNOSIS — R079 Chest pain, unspecified: Secondary | ICD-10-CM | POA: Diagnosis not present

## 2021-07-20 DIAGNOSIS — R0989 Other specified symptoms and signs involving the circulatory and respiratory systems: Secondary | ICD-10-CM | POA: Diagnosis not present

## 2021-07-20 DIAGNOSIS — E782 Mixed hyperlipidemia: Secondary | ICD-10-CM | POA: Diagnosis not present

## 2021-07-20 DIAGNOSIS — M5412 Radiculopathy, cervical region: Secondary | ICD-10-CM | POA: Diagnosis not present

## 2021-07-20 DIAGNOSIS — F320 Major depressive disorder, single episode, mild: Secondary | ICD-10-CM

## 2021-07-20 DIAGNOSIS — E1065 Type 1 diabetes mellitus with hyperglycemia: Secondary | ICD-10-CM | POA: Diagnosis not present

## 2021-07-20 MED ORDER — METOPROLOL TARTRATE 100 MG PO TABS: 100.0000 mg | ORAL_TABLET | Freq: Once | ORAL | 0 refills | Status: DC

## 2021-07-20 MED ORDER — PREDNISONE 50 MG PO TABS
ORAL_TABLET | ORAL | 0 refills | Status: DC
  Filled 2021-07-20: qty 5, 5d supply, fill #0

## 2021-07-20 MED ORDER — PREDNISONE 50 MG PO TABS: ORAL_TABLET | ORAL | 0 refills | Status: DC

## 2021-07-20 NOTE — Assessment & Plan Note (Signed)
Has tried increasing protonix without improvement.  Recommend adding flonase daily.  Can consider addition of singulair as well.  ?

## 2021-07-20 NOTE — Patient Instructions (Addendum)
Medication Instructions:  ?Your physician recommends that you continue on your current medications as directed. Please refer to the Current Medication list given to you today. ? ?*If you need a refill on your cardiac medications before your next appointment, please call your pharmacy* ? ? ?Lab Work: ? ?Labs in [address] 1 week before CT: BMP ? ?If you have labs (blood work) drawn today and your tests are completely normal, you will receive your results only by: ?MyChart Message (if you have MyChart) OR ?A paper copy in the mail ?If you have any lab test that is abnormal or we need to change your treatment, we will call you to review the results. ? ? ?Testing/Procedures: ? ? ?Your cardiac CT will be scheduled at one of the below locations:  ? ?[facility] ?[address] ?[place], [place] [address] ?[phone] ? ?OR ? ?[facility] ?[address] ?[address] ?[place], [place] [address] ?[phone] ? ?If scheduled at [facility], please arrive at the [facility] and Children's Entrance (Entrance C2) of [facility] 30 minutes prior to test start time. ?You can use the FREE valet parking offered at entrance C (encouraged to control the heart rate for the test)  ?Proceed to the [facility] Radiology Department (first floor) to check-in and test prep. ? ?All radiology patients and guests should use entrance C2 at [facility], accessed from [facility], even though the hospital's physical address listed is [address] ? ? ? ?If scheduled at [facility], please arrive 15 mins early for check-in and test prep. ? ?Please follow these instructions carefully (unless otherwise directed): ? ?Hold all erectile dysfunction medications at least 3 days (72 hrs) prior to test. ? ?On the Night Before the Test: ?Be sure to Drink plenty of water. ?Do not consume any caffeinated/decaffeinated beverages or chocolate 12  hours prior to your test. ?Do not take any antihistamines 12 hours prior to your test. ? ?On the Day of the Test: ?Drink plenty of water until 1 hour prior to the test. ?Do not eat any food 4 hours prior to the test. ?You may take your regular medications prior to the test.  ?Take metoprolol (Lopressor) two hours prior to test. ?     ?After the Test: ?Drink plenty of water. ?After receiving IV contrast, you may experience a mild flushed feeling. This is normal. ?On occasion, you may experience a mild rash up to 24 hours after the test. This is not dangerous. If this occurs, you can take Benadryl 25 mg and increase your fluid intake. ?If you experience trouble breathing, this can be serious. If it is severe call 911 IMMEDIATELY. If it is mild, please call our office. ?If you take any of these medications: Glipizide/Metformin, Avandament, Glucavance, please do not take 48 hours after completing test unless otherwise instructed. ? ?We will call to schedule your test 2-4 weeks out understanding that some insurance companies will need an authorization prior to the service being performed.  ? ?For non-scheduling related questions, please contact the cardiac imaging nurse navigator should you have any questions/concerns: ?Rockwell Alexandria, Cardiac Imaging Nurse Navigator ?Larey Brick, Cardiac Imaging Nurse Navigator ?[facility] ?Direct Office Dial: [phone]  ? ?For scheduling needs, including cancellations and rescheduling, please call [place], [phone]. ? ? ? ?Follow-Up: ?At [facility], you and your health needs are our priority.  As part of our continuing mission to provide  you with exceptional heart care, we have created designated Provider Care Teams.  These Care Teams include your primary Cardiologist (physician) and Advanced Practice Providers (APPs -  Physician Assistants and Nurse Practitioners) who all work together to provide you with the care you need, when you need  it. ? ?We recommend signing up for the patient portal called "MyChart".  Sign up information is provided on this After Visit Summary.  MyChart is used to connect with patients for Virtual Visits (Telemedicine).  Patients are able to view lab/test results, encounter notes, upcoming appointments, etc.  Non-urgent messages can be sent to your provider as well.   ?To learn more about what you can do with MyChart, go to [email or web address].   ? ?Your next appointment:   ?8 week(s) ? ?The format for your next appointment:   ?In Person ? ?Provider:   ?Norman Herrlich, MD  ? ? ?Other Instructions ?Purchase a smart watch to monitor heart rhythm ? ?Prednisone 50 mg - take 13 hours prior to test 2. Take another Prednisone 50 mg 7 hours prior to test 3. Take another Prednisone 50 mg 1 hour prior to test ? ?Benadryl 1 hour prior to the CT ? ? ?Important Information About Sugar ? ? ? ? ? ? ?

## 2021-07-20 NOTE — Progress Notes (Signed)
?FONTAINE HEHL - 56 y.o. male MRN [id number]  Date of birth: April 28, 1965 ? ?Subjective ?No chief complaint on file. ? ? ?HPI ?KAILASH HINZE is a 56 y.o. male here today for follow up.  ? ?He has complaint of pain radiating down the right arm from the neck.  He has history of cervical radiculopathy.  Had MRI in 2014 showing broad based disc protrusion at C5-6.  Had injections which provided improvement for several years.  Denies weakness in the arm or hand.   ? ?He continues to see endocrinology for management of diabetes.  He is on an insulin pump and metformin. Last a1c was 10.0% 6 months ago.   ? ?Continues on fluoxetine for management of depression and anxiety.  Stable at this time with current strength of medication.   ? ?ROS:  A comprehensive ROS was completed and negative except as noted per HPI ? ? ?Allergies  ?Allergen Reactions  ? Cymbalta [Duloxetine Hcl] Other (See Comments)  ?  Suicidal thoughts  ? Influenza Vaccines Other (See Comments)  ?  Unknown reaction  ? Penicillins Other (See Comments)  ?  Unknown - childhood allergy per mother  ? Tobacco Swelling  ?  Arm swelled up with allergy testing   ? Ace Inhibitors Swelling  ?  Angioedema with lisinopril  ? Iodinated Contrast Media Hives and Itching  ? Pioglitazone Other (See Comments)  ?    headache  ? Promethazine Hcl Nausea And Vomiting  ?     ? Reglan [Metoclopramide] Other (See Comments)  ?  Jittery, increased anxiety  ? ? ?Past Medical History:  ?Diagnosis Date  ? Biceps tendinitis, left, distal 11/17/2020  ? Cervical radiculitis 01/25/2015  ? Chronic headaches 09/28/2011  ? Closed avulsion fracture of left ankle 02/24/2020  ? COVID-19 08/11/2020  ? Depression   ? Diabetes mellitus   ? type II  ? Fatigue 12/16/2008  ? Qualifier: Diagnosis of  By: Nena Jordan   ? GERD (gastroesophageal reflux disease)   ? Headache(784.0)   ? Hyperlipidemia 08/11/2020  ? Hypogonadism male   ? Neck pain 02/27/2013  ? Obstructive sleep apnea 06/09/2016  ?  Unattended Home Sleep Test 07/05/16-AHI 14.7/hour, desaturation to 79%, body weight 189 pounds.  AHI around 2.5 usually 04/23/18   ? Pain in right knee 08/07/2018  ? Presence of insulin pump (external) (internal) 06/16/2021  ? Seasonal and perennial allergic rhinitis 09/28/2011  ? Sleep apnea   ? CPAP  ? Type 1 diabetes mellitus, uncontrolled 10/02/2015  ? ? ?Past Surgical History:  ?Procedure Laterality Date  ? NASAL SINUS SURGERY  04/1991  ? TONSILLECTOMY  1972  ? ? ?Social History  ? ?Socioeconomic History  ? Marital status: Married  ?  Spouse name: Asencion Islam  ? Number of children: 2  ? Years of education: Not on file  ? Highest education level: Not on file  ?Occupational History  ?  Employer: [employer]  ?  Comment: Mental Health Case Management  ?Tobacco Use  ? Smoking status: Never  ? Smokeless tobacco: Never  ?Vaping Use  ? Vaping Use: Never used  ?Substance and Sexual Activity  ? Alcohol use: No  ? Drug use: No  ? Sexual activity: Not on file  ?Other Topics Concern  ? Not on file  ?Social History Narrative  ? He lives with wife and two children.  ? 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  ? Highest level of education: Child psychotherapist  degree  ? Caffeine- diet soda 2 L a day  ? ?Social Determinants of Health  ? ?Financial Resource Strain: Not on file  ?Food Insecurity: Not on file  ?Transportation Needs: Not on file  ?Physical Activity: Not on file  ?Stress: Not on file  ?Social Connections: Not on file  ? ? ?Family History  ?Adopted: Yes  ?Problem Relation Age of Onset  ? Cancer Father 39  ?     Prostate cancer  ? Diabetes Mellitus I Son   ? ? ?Health Maintenance  ?Topic Date Due  ? HIV Screening  Never done  ? Hepatitis C Screening  Never done  ? Zoster Vaccines- Shingrix (1 of 2) Never done  ? OPHTHALMOLOGY EXAM  07/19/2018  ? COVID-19 Vaccine (4 - Booster for Moderna series) 03/16/2020  ? TETANUS/TDAP  03/01/2021  ? INFLUENZA VACCINE  10/19/2021  ? HEMOGLOBIN A1C  12/02/2021  ?  URINE MICROALBUMIN  01/05/2022  ? FOOT EXAM  04/22/2022  ? Fecal DNA (Cologuard)  05/21/2023  ? HPV VACCINES  Aged Out  ? ? ? ?----------------------------------------------------------------------------------------------------------------------------------------------------------------------------------------------------------------- ?Physical Exam ?BP 115/69 (BP Location: Left Arm, Patient Position: Sitting, Cuff Size: Large)   Pulse 70   Ht 6' (1.829 m)   Wt 225 lb (102.1 kg)   SpO2 96%   BMI 30.52 kg/m?  ? ?Physical Exam ?Constitutional:   ?   Appearance: Normal appearance.  ?Eyes:  ?   General: No scleral icterus. ?Musculoskeletal:  ?   Cervical back: Neck supple.  ?Psychiatric:     ?   Mood and Affect: Mood normal.     ?   Behavior: Behavior normal.  ? ? ?------------------------------------------------------------------------------------------------------------------------------------------------------------------------------------------------------------------- ?Assessment and Plan ? ?Uncontrolled type 1 diabetes mellitus with hyperglycemia (HCC) ?Management for endocrinology.  On pump at this time.   ? ?Cervical radiculitis ?Increased pain recently.  Adding on prednisone 50mg  daily x5 days. Instructed to monitor glucose closely while taking.  HEP printed as well.  If not improving after 4-6 weeks will plan for MRI.   ? ?MDD (major depressive disorder) ?He will continue on fluoxetine at current strength.  ? ?Chronic throat clearing ?Has tried increasing protonix without improvement.  Recommend adding flonase daily.  Can consider addition of singulair as well.  ? ? ?Meds ordered this encounter  ?Medications  ? predniSONE (DELTASONE) 50 MG tablet  ?  Sig: Take 1 tablet by mouth for 5 days  ?  Dispense:  5 tablet  ?  Refill:  0  ? ? ?No follow-ups on file. ? ? ? ?This visit occurred during the SARS-CoV-2 public health emergency.  Safety protocols were in place, including screening questions prior to the  visit, additional usage of staff PPE, and extensive cleaning of exam room while observing appropriate contact time as indicated for disinfecting solutions.  ? ?

## 2021-07-20 NOTE — Assessment & Plan Note (Signed)
He will continue on fluoxetine at current strength.  ?

## 2021-07-20 NOTE — Assessment & Plan Note (Signed)
Increased pain recently.  Adding on prednisone 50mg  daily x5 days. Instructed to monitor glucose closely while taking.  HEP printed as well.  If not improving after 4-6 weeks will plan for MRI.   ?

## 2021-07-20 NOTE — Assessment & Plan Note (Signed)
Management for endocrinology.  On pump at this time.   ?

## 2021-07-20 NOTE — Patient Instructions (Signed)
Start prednisone 50mg  daily x5 days.  ?Monitor blood sugars closely while taking prednisone and adjust insulin as needed.  ?Try home exercises.  ?Let me know if not improving with this after 4-6 weeks.  ? ?See me again in 6 months.  ?

## 2021-07-21 ENCOUNTER — Encounter: Payer: Self-pay | Admitting: Cardiology

## 2021-07-28 ENCOUNTER — Encounter: Payer: Self-pay | Admitting: Family Medicine

## 2021-07-29 ENCOUNTER — Other Ambulatory Visit (HOSPITAL_BASED_OUTPATIENT_CLINIC_OR_DEPARTMENT_OTHER): Payer: Self-pay

## 2021-08-27 ENCOUNTER — Other Ambulatory Visit (HOSPITAL_BASED_OUTPATIENT_CLINIC_OR_DEPARTMENT_OTHER): Payer: Self-pay

## 2021-08-27 ENCOUNTER — Encounter: Payer: Self-pay | Admitting: Internal Medicine

## 2021-08-27 DIAGNOSIS — E1065 Type 1 diabetes mellitus with hyperglycemia: Secondary | ICD-10-CM

## 2021-08-27 MED ORDER — FREESTYLE LIBRE 3 SENSOR MISC
2.0000 | 0 refills | Status: DC
  Filled 2021-08-27: qty 5, 70d supply, fill #0
  Filled 2021-08-27: qty 1, 14d supply, fill #0

## 2021-08-30 ENCOUNTER — Other Ambulatory Visit (HOSPITAL_BASED_OUTPATIENT_CLINIC_OR_DEPARTMENT_OTHER): Payer: Self-pay

## 2021-08-31 ENCOUNTER — Other Ambulatory Visit (HOSPITAL_BASED_OUTPATIENT_CLINIC_OR_DEPARTMENT_OTHER): Payer: Self-pay

## 2021-09-03 ENCOUNTER — Other Ambulatory Visit (HOSPITAL_BASED_OUTPATIENT_CLINIC_OR_DEPARTMENT_OTHER): Payer: Self-pay

## 2021-09-08 ENCOUNTER — Other Ambulatory Visit: Payer: Self-pay | Admitting: Family Medicine

## 2021-09-08 ENCOUNTER — Other Ambulatory Visit (HOSPITAL_BASED_OUTPATIENT_CLINIC_OR_DEPARTMENT_OTHER): Payer: Self-pay

## 2021-09-09 ENCOUNTER — Other Ambulatory Visit (HOSPITAL_BASED_OUTPATIENT_CLINIC_OR_DEPARTMENT_OTHER): Payer: Self-pay

## 2021-09-09 ENCOUNTER — Encounter: Payer: Self-pay | Admitting: Family Medicine

## 2021-09-09 MED ORDER — ROSUVASTATIN CALCIUM 20 MG PO TABS
20.0000 mg | ORAL_TABLET | Freq: Every day | ORAL | 3 refills | Status: DC
Start: 2021-09-09 — End: 2022-01-13
  Filled 2021-09-09: qty 90, 90d supply, fill #0

## 2021-09-17 ENCOUNTER — Emergency Department
Admission: RE | Admit: 2021-09-17 | Discharge: 2021-09-17 | Disposition: A | Discharge status: Home / Self Care | Payer: Managed Care, Other (non HMO) | Source: Ambulatory Visit

## 2021-09-17 VITALS — BP 123/73 | HR 68 | Temp 98.3°F | Resp 18

## 2021-09-17 DIAGNOSIS — H9203 Otalgia, bilateral: Secondary | ICD-10-CM | POA: Diagnosis not present

## 2021-09-17 NOTE — ED Triage Notes (Signed)
Pt c/o bilateral ear fullness, popping and some pain and HA for a couple weeks. Tried cleaning them out at home with no relief.

## 2021-09-17 NOTE — Discharge Instructions (Addendum)
Advised/encouraged patient to follow-up with ENT at scheduled appointment in July for further evaluation of bilateral ear fullness.

## 2021-09-17 NOTE — ED Provider Notes (Addendum)
Nathaniel [name] CARE    CSN: [id number] Arrival date & time: 09/17/21  1738      History   Chief Complaint Chief Complaint  Patient presents with   Ear Fullness    It feels like I have swimmers ear or something similiar.  I have been told by [facility] I have left ear hearing loss.  I feel congested and some popping feeling in my ear and would like to get it checked out.  Some ear pain. - Entered by patient    HPI Nathaniel [name] is a 56 y.o. male.   HPI 56 year old male presents with bilateral ear-year-old fullness, reports it feels like he has swimmers ear or similar for 2 weeks.  Patient reports that he has been evaluated by [facility] audiology who informed him that he has left hearing loss.  Additionally, patient reports attempting to clear/clean ears on his own at home.  Reports feeling congested and popping feeling/sensations in ears bilaterally with mild ear pain.  PMH significant for uncontrolled type 1 diabetes with insulin pump present, depression, chronic headaches, fatigue, and OSA.  Past Medical History:  Diagnosis Date   Biceps tendinitis, left, distal 11/17/2020   Cervical radiculitis 01/25/2015   Chronic headaches 09/28/2011   Closed avulsion fracture of left ankle 02/24/2020   COVID-19 08/11/2020   Depression    Diabetes mellitus    type II   Fatigue 12/16/2008   Qualifier: Diagnosis of  By: Wynona Luna    GERD (gastroesophageal reflux disease)    Headache(784.0)    Hyperlipidemia 08/11/2020   Hypogonadism male    Neck pain 02/27/2013   Obstructive sleep apnea 06/09/2016   Unattended Home Sleep Test 07/05/16-AHI 14.7/hour, desaturation to 79%, body weight 189 pounds.  AHI around 2.5 usually 04/23/18    Pain in right knee 08/07/2018   Presence of insulin pump (external) (internal) 06/16/2021   Seasonal and perennial allergic rhinitis 09/28/2011   Sleep apnea    CPAP   Type 1 diabetes mellitus, uncontrolled 10/02/2015    Patient  Active Problem List   Diagnosis Date Noted   Chronic throat clearing 07/20/2021   Hypogonadism male 07/19/2021   Presence of insulin pump (external) (internal) 06/16/2021   Biceps tendinitis, left, distal 11/17/2020   Hyperlipidemia 08/11/2020   COVID-19 08/11/2020   Closed avulsion fracture of left ankle 02/24/2020   GERD (gastroesophageal reflux disease) 11/21/2019   Well adult exam 04/30/2019   Pain in right knee 08/07/2018   Obstructive sleep apnea 06/09/2016   Uncontrolled type 1 diabetes mellitus with hyperglycemia ([place]) 10/02/2015   Cervical radiculitis 01/25/2015   Neck pain 02/27/2013   MDD (major depressive disorder) 12/19/2012   Chronic headaches 09/28/2011   Seasonal and perennial allergic rhinitis 09/28/2011   Fatigue 12/16/2008    Past Surgical History:  Procedure Laterality Date   NASAL SINUS SURGERY  04/1991   TONSILLECTOMY  1972       Home Medications    Prior to Admission medications   Medication Sig Start Date End Date Taking? Authorizing Provider  azelastine (ASTELIN) 0.1 % nasal spray Place 2 sprays into both nostrils 2 (two) times daily. Use in each nostril as directed 12/14/20   Terrilyn Saver, NP  Continuous Blood Gluc Sensor (FREESTYLE LIBRE 3 SENSOR) MISC Apply on to the skin every 14 days. 08/27/21   Philemon Kingdom, MD  COVID-19 At Home Antigen Test [facility] COVID-19 HOME TEST) KIT Use as directed per package instructions 07/15/21  Carlyle Basques, MD  FLUoxetine (PROZAC) 40 MG capsule Take 1 capsule (40 mg total) by mouth daily. 04/22/21   Luetta Nutting, DO  Insulin Lispro-aabc (LYUMJEV) 100 UNIT/ML SOLN Use up to 100 units a day in the insulin pump. 06/04/21   Philemon Kingdom, MD  metFORMIN (GLUCOPHAGE-XR) 500 MG 24 hr tablet Take 4 tablets (2,000 mg total) by mouth daily with breakfast. 11/04/20   Philemon Kingdom, MD  metoprolol tartrate (LOPRESSOR) 100 MG tablet Take 1 tablet (100 mg total) by mouth once for 1 dose. 07/20/21 07/20/21  Richardo Priest, MD  MULTIPLE VITAMIN PO Take 1 tablet by mouth daily.    [provider]  pantoprazole (PROTONIX) 20 MG tablet Take 20 mg by mouth 2 (two) times daily.    [provider]  predniSONE (DELTASONE) 50 MG tablet Take 1 tablet by mouth for 5 days 07/20/21   Luetta Nutting, DO  predniSONE (DELTASONE) 50 MG tablet Prednisone 50 mg - take 13 hours prior to test2. Take another Prednisone 50 mg 7 hours prior to test3. Take    another Prednisone 50 mg 1 hour prior to test 07/20/21   Richardo Priest, MD  rosuvastatin (CRESTOR) 20 MG tablet Take 1 tablet (20 mg total) by mouth daily. 09/09/21   Luetta Nutting, DO    Family History Family History  Adopted: Yes  Problem Relation Age of Onset   Cancer Father 18       Prostate cancer   Diabetes Mellitus I Son     Social History Social History   Tobacco Use   Smoking status: Never    Passive exposure: Never   Smokeless tobacco: Never  Vaping Use   Vaping Use: Never used  Substance Use Topics   Alcohol use: No   Drug use: No     Allergies   Cymbalta [duloxetine hcl], Influenza vaccines, Penicillins, Tobacco, Ace inhibitors, Iodinated contrast media, Pioglitazone, Promethazine hcl, and Reglan [metoclopramide]   Review of Systems Review of Systems  HENT:         Bilateral ear fullness for 3-weeks  All other systems reviewed and are negative.    Physical Exam Triage Vital Signs ED Triage Vitals  Enc Vitals Group     BP      Pulse      Resp      Temp      Temp src      SpO2      Weight      Height      Head Circumference      Peak Flow      Pain Score      Pain Loc      Pain Edu?      Excl. in [place]?    No data found.  Updated Vital Signs BP 123/73 (BP Location: Right Arm)   Pulse 68   Temp 98.3 F (36.8 C) (Oral)   Resp 18   SpO2 98%   Physical Exam Vitals and nursing note reviewed.  Constitutional:      General: He is not in acute distress.    Appearance: Normal appearance. He is obese. He is  not ill-appearing.  HENT:     Head: Normocephalic and atraumatic.     Right Ear: Tympanic membrane, ear canal and external ear normal.     Left Ear: Tympanic membrane, ear canal and external ear normal.     Mouth/Throat:     Mouth: Mucous membranes are moist.  Pharynx: Oropharynx is clear.  Eyes:     Extraocular Movements: Extraocular movements intact.     Conjunctiva/sclera: Conjunctivae normal.     Pupils: Pupils are equal, round, and reactive to light.  Cardiovascular:     Rate and Rhythm: Normal rate and regular rhythm.     Pulses: Normal pulses.     Heart sounds: Normal heart sounds. No murmur heard. Pulmonary:     Effort: Pulmonary effort is normal.     Breath sounds: Normal breath sounds. No wheezing, rhonchi or rales.  Musculoskeletal:     Cervical back: Normal range of motion and neck supple.  Skin:    General: Skin is warm and dry.  Neurological:     General: No focal deficit present.     Mental Status: He is alert and oriented to person, place, and time. Mental status is at baseline.      UC Treatments / Results  Labs (all labs ordered are listed, but only abnormal results are displayed) Labs Reviewed - No data to display  EKG   Radiology No results found.  Procedures Procedures (including critical care time)  Medications Ordered in UC Medications - No data to display  Initial Impression / Assessment and Plan / UC Course  I have reviewed the triage vital signs and the nursing notes.  Pertinent labs & imaging results that were available during my care of the patient were reviewed by me and considered in my medical decision making (see chart for details).     MDM: 1. Bilateral otalgia-Advised/encouraged patient to follow-up with ENT at scheduled appointment in July for further evaluation of bilateral ear fullness. Uncomfortable/unpleasant experience with this patient following my exam this evening.  I sat down on stool in exam room facing patient and  explained to patient that his exam this evening was completely unremarkable; that his auditory canals and his tympanic membranes were clear on either side. Asked patient could he please tell me more about his current symptoms? Immediately patient states to me: " So, I have to feel bad until ENT evaluation.  Do you always talk down to people suddenly or speak condescendingly".  Immediately, I apologized to the patient and stated "I am sorry that you feel that way, that was not my intention."  Patient left exam room and in clinic following this statement.   Final Clinical Impressions(s) / UC Diagnoses   Final diagnoses:  Otalgia, bilateral     Discharge Instructions      Advised/encouraged patient to follow-up with ENT at scheduled appointment in July for further evaluation of bilateral ear fullness.     ED Prescriptions   None    PDMP not reviewed this encounter.   Eliezer Lofts, [place] 09/17/21 [place], [place], [place] 09/17/21 [phone]

## 2021-09-20 ENCOUNTER — Encounter: Payer: Self-pay | Admitting: Internal Medicine

## 2021-09-24 ENCOUNTER — Other Ambulatory Visit (HOSPITAL_BASED_OUTPATIENT_CLINIC_OR_DEPARTMENT_OTHER): Payer: Self-pay

## 2021-09-29 ENCOUNTER — Ambulatory Visit: Payer: Managed Care, Other (non HMO) | Admitting: Sports Medicine

## 2021-10-04 ENCOUNTER — Other Ambulatory Visit (HOSPITAL_BASED_OUTPATIENT_CLINIC_OR_DEPARTMENT_OTHER): Payer: Self-pay

## 2021-10-04 ENCOUNTER — Ambulatory Visit: Payer: Managed Care, Other (non HMO) | Admitting: Internal Medicine

## 2021-10-12 ENCOUNTER — Ambulatory Visit: Payer: Managed Care, Other (non HMO) | Admitting: Cardiology

## 2021-10-18 ENCOUNTER — Other Ambulatory Visit (HOSPITAL_BASED_OUTPATIENT_CLINIC_OR_DEPARTMENT_OTHER): Payer: Self-pay

## 2021-10-22 ENCOUNTER — Other Ambulatory Visit: Payer: Self-pay | Admitting: Family Medicine

## 2021-10-22 ENCOUNTER — Encounter: Payer: Self-pay | Admitting: Family Medicine

## 2021-10-22 ENCOUNTER — Other Ambulatory Visit (HOSPITAL_BASED_OUTPATIENT_CLINIC_OR_DEPARTMENT_OTHER): Payer: Self-pay

## 2021-10-22 DIAGNOSIS — F320 Major depressive disorder, single episode, mild: Secondary | ICD-10-CM

## 2021-10-22 MED ORDER — FLUOXETINE HCL 20 MG PO CAPS
20.0000 mg | ORAL_CAPSULE | Freq: Every day | ORAL | 3 refills | Status: DC
  Filled 2021-10-22: qty 90, 90d supply, fill #0

## 2021-10-22 NOTE — Progress Notes (Signed)
Pt requesting to get off prozac. I am covering for Dr. Ashley Royalty and will send in prozac 20mg  with instructions to take for two weeks, then cut in half to take 10mg  for two weeks and then every other day for two weeks.

## 2021-10-23 IMAGING — DX DG ANKLE COMPLETE 3+V*L*
3 series · 3 of 3 positions shown · non-contrast
Comparison: None.

CLINICAL DATA: Fell down stairs yesterday injuring the left ankle.
Pain and swelling.

EXAM:
LEFT ANKLE COMPLETE - 3+ VIEW

[ankle ap]
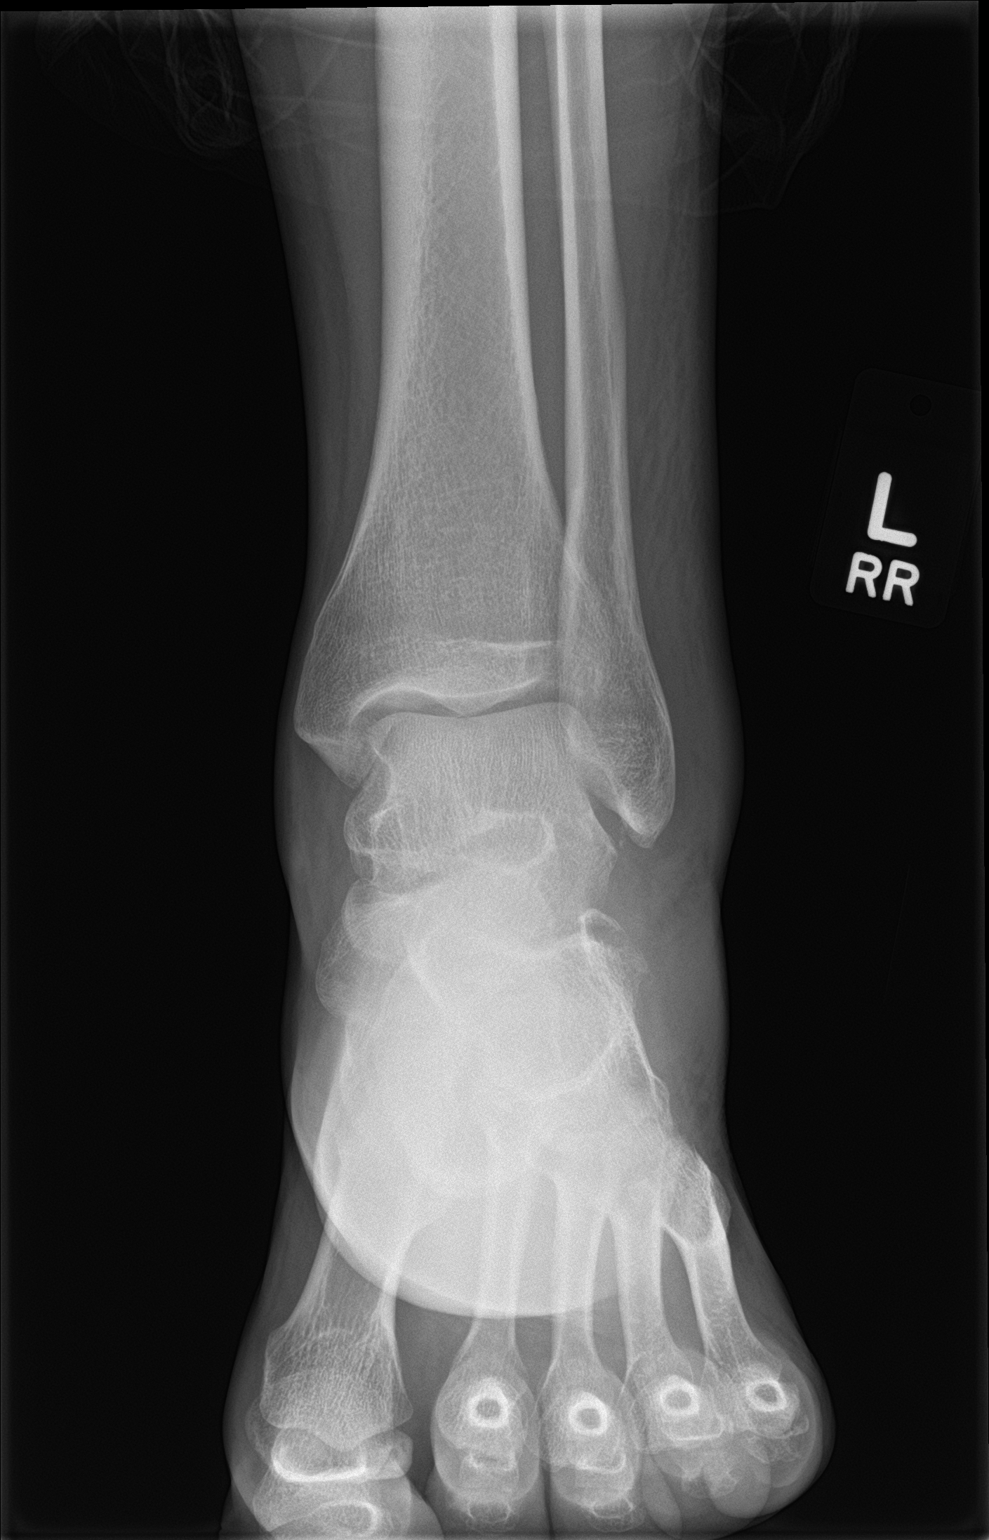

[ankle obl]
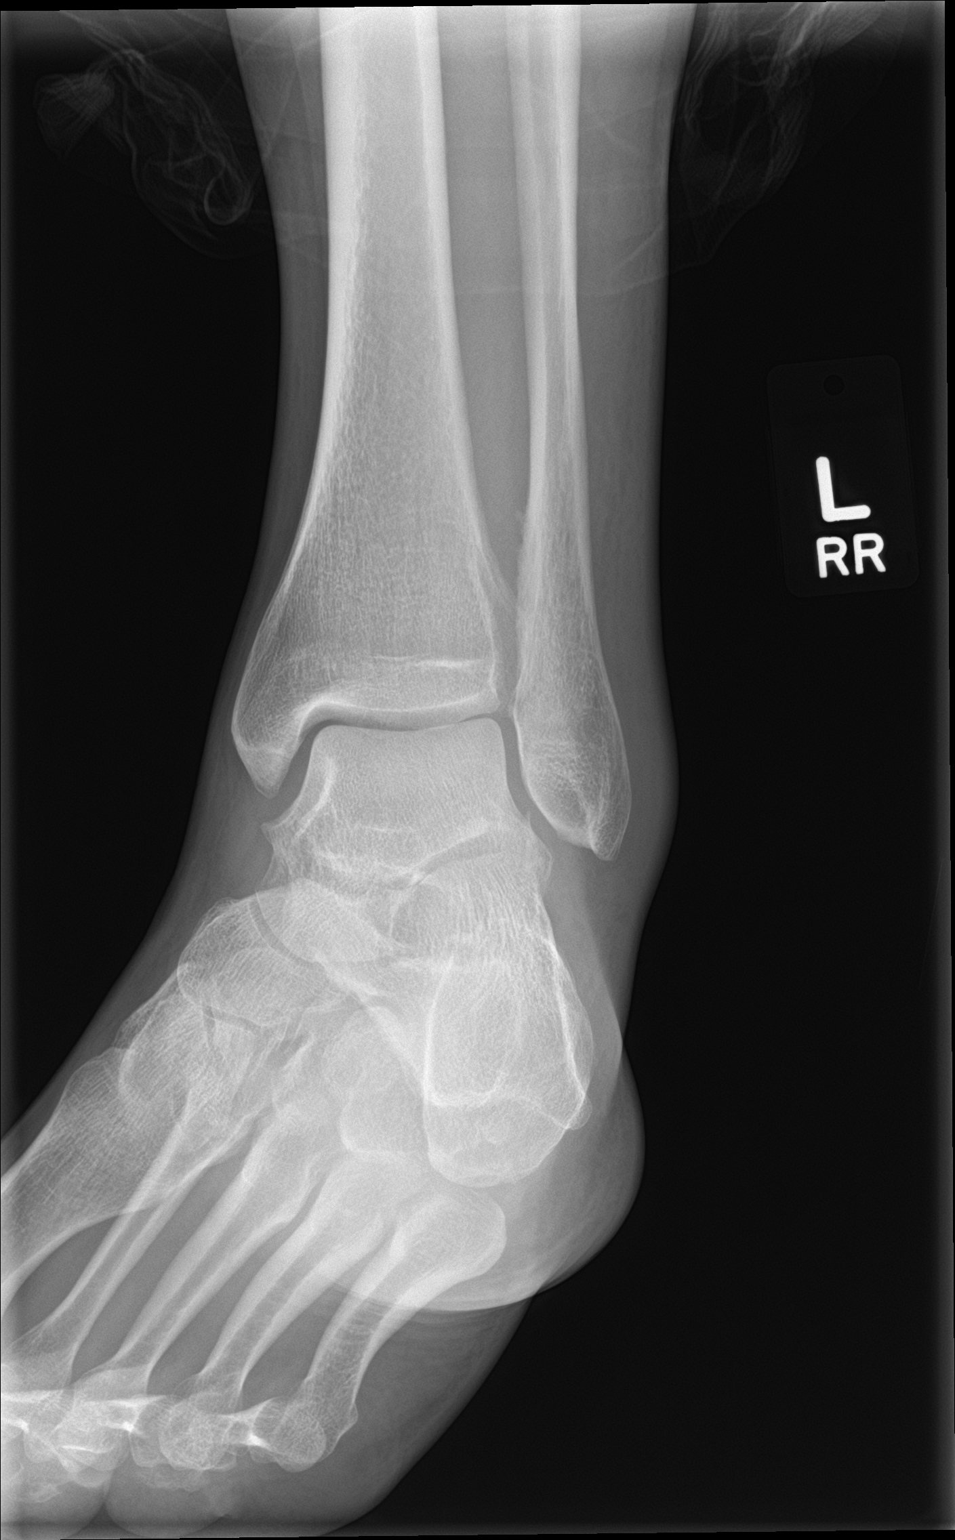

[ankle lat]
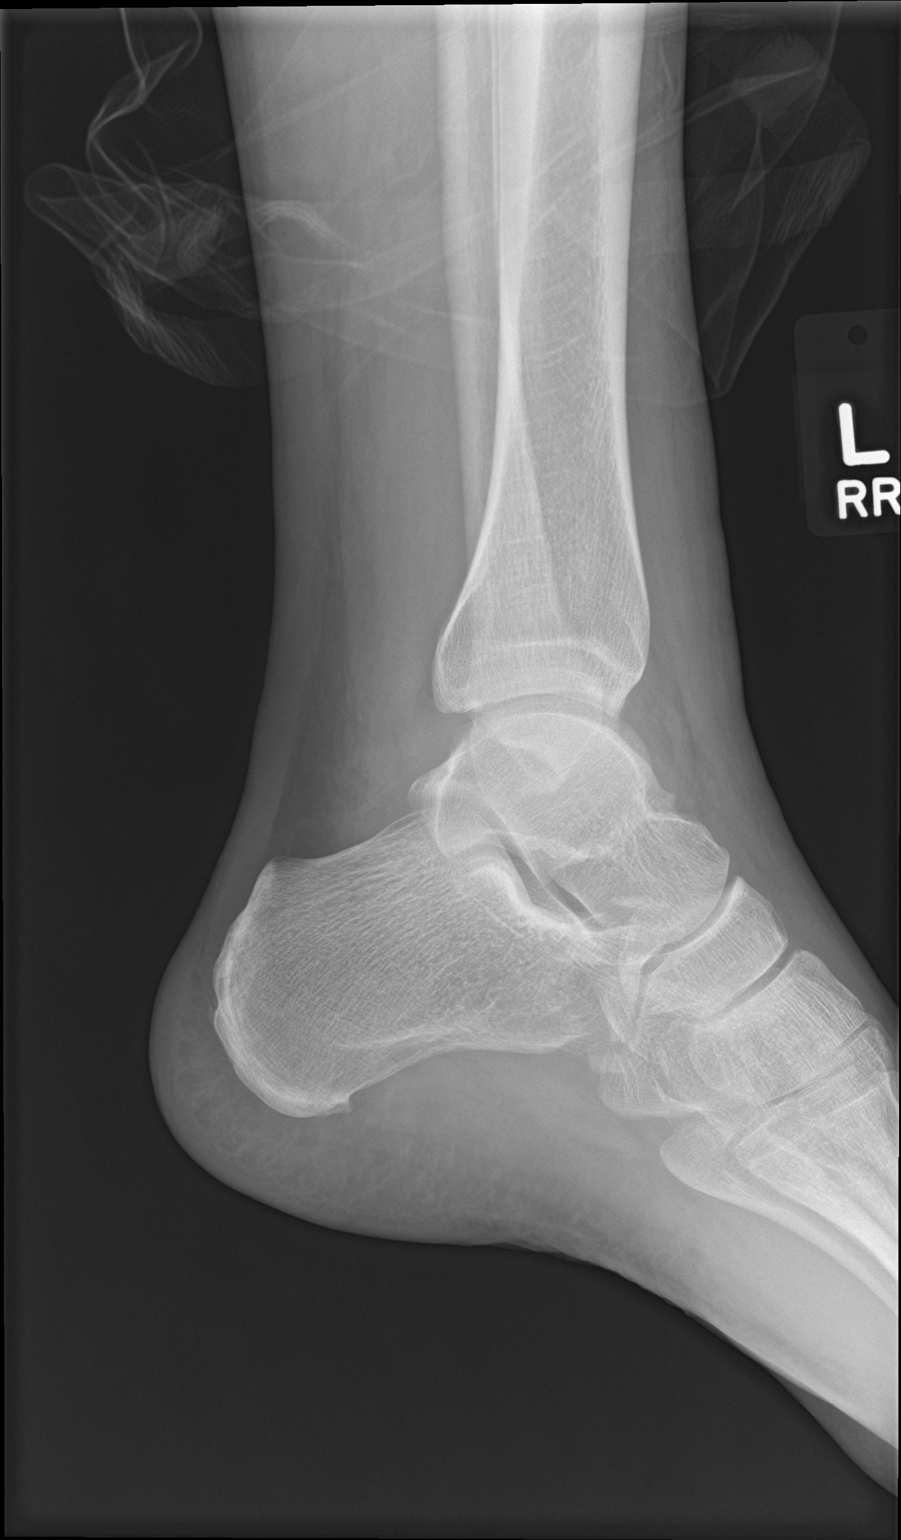

[3 of 3 positions shown; findings below may reference images not displayed]

FINDINGS: Tiny bone densities project between distal fibula and adjacent talus
suggesting minor avulsion fractures.

No other evidence of a fracture. Ankle joint is normally spaced and
aligned.

There is soft tissue swelling which predominates laterally.
IMPRESSION: 1. Possible tiny avulsion fractures laterally, suspected to be from
the talus at the insertion of the anterior talofibular ligament.
2. No other evidence of a fracture normally aligned ankle joint.
3. Predominantly lateral soft tissue swelling.

## 2021-10-23 MED ORDER — FLUOXETINE HCL 20 MG PO TABS: ORAL_TABLET | ORAL | 0 refills | Status: DC

## 2021-10-23 NOTE — Telephone Encounter (Signed)
Switched to tablets per patient request.

## 2021-10-25 ENCOUNTER — Other Ambulatory Visit (HOSPITAL_BASED_OUTPATIENT_CLINIC_OR_DEPARTMENT_OTHER): Payer: Self-pay

## 2021-10-25 NOTE — Telephone Encounter (Signed)
We've all done it. :-P/

## 2021-10-29 ENCOUNTER — Encounter: Payer: Self-pay | Admitting: Internal Medicine

## 2021-11-01 ENCOUNTER — Telehealth: Payer: Self-pay

## 2021-11-01 DIAGNOSIS — E1065 Type 1 diabetes mellitus with hyperglycemia: Secondary | ICD-10-CM

## 2021-11-01 NOTE — Telephone Encounter (Signed)
Patient lvm requesting referral to a Dietician to help with lowering blood sugar levels and weight loss.

## 2021-11-01 NOTE — Telephone Encounter (Signed)
Referral entered  

## 2021-11-03 NOTE — Telephone Encounter (Signed)
Task completed. Patient has an upcoming appointment in October with a dietician.

## 2021-11-08 ENCOUNTER — Other Ambulatory Visit: Payer: Self-pay | Admitting: Internal Medicine

## 2021-11-09 ENCOUNTER — Other Ambulatory Visit (HOSPITAL_BASED_OUTPATIENT_CLINIC_OR_DEPARTMENT_OTHER): Payer: Self-pay

## 2021-11-09 MED ORDER — METFORMIN HCL ER 500 MG PO TB24
2000.0000 mg | ORAL_TABLET | Freq: Every day | ORAL | 3 refills | Status: DC
Start: 2021-11-09 — End: 2022-12-07
  Filled 2021-11-09: qty 360, 90d supply, fill #0
  Filled 2022-05-19: qty 360, 90d supply, fill #1
  Filled 2022-09-09: qty 360, 90d supply, fill #2

## 2021-11-23 ENCOUNTER — Other Ambulatory Visit (HOSPITAL_BASED_OUTPATIENT_CLINIC_OR_DEPARTMENT_OTHER): Payer: Self-pay

## 2021-11-23 ENCOUNTER — Encounter: Payer: Self-pay | Admitting: Family Medicine

## 2021-11-23 ENCOUNTER — Other Ambulatory Visit: Payer: Self-pay | Admitting: Internal Medicine

## 2021-11-23 DIAGNOSIS — E1065 Type 1 diabetes mellitus with hyperglycemia: Secondary | ICD-10-CM

## 2021-11-23 MED ORDER — FREESTYLE LIBRE 3 SENSOR MISC
2.0000 | 0 refills | Status: DC
  Filled 2021-11-23: qty 2, 28d supply, fill #0
  Filled 2021-12-17: qty 2, 28d supply, fill #1
  Filled 2022-01-18: qty 2, 28d supply, fill #2

## 2021-11-24 ENCOUNTER — Other Ambulatory Visit (HOSPITAL_BASED_OUTPATIENT_CLINIC_OR_DEPARTMENT_OTHER): Payer: Self-pay

## 2021-11-25 ENCOUNTER — Other Ambulatory Visit (HOSPITAL_BASED_OUTPATIENT_CLINIC_OR_DEPARTMENT_OTHER): Payer: Self-pay

## 2021-11-25 ENCOUNTER — Other Ambulatory Visit: Payer: Self-pay | Admitting: Family Medicine

## 2021-11-25 DIAGNOSIS — J302 Other seasonal allergic rhinitis: Secondary | ICD-10-CM

## 2021-11-25 MED ORDER — AZELASTINE HCL 0.1 % NA SOLN
2.0000 | Freq: Two times a day (BID) | NASAL | 1 refills | Status: DC
  Filled 2021-11-25: qty 30, 50d supply, fill #0

## 2021-11-29 ENCOUNTER — Encounter: Payer: Self-pay | Admitting: Physician Assistant

## 2021-11-29 ENCOUNTER — Other Ambulatory Visit (HOSPITAL_BASED_OUTPATIENT_CLINIC_OR_DEPARTMENT_OTHER): Payer: Self-pay

## 2021-11-29 ENCOUNTER — Ambulatory Visit: Payer: 59 | Admitting: Physician Assistant

## 2021-11-29 VITALS — BP 124/77 | HR 80 | Wt 230.0 lb

## 2021-11-29 DIAGNOSIS — R0989 Other specified symptoms and signs involving the circulatory and respiratory systems: Secondary | ICD-10-CM | POA: Diagnosis not present

## 2021-11-29 MED ORDER — FAMOTIDINE 20 MG PO TABS
20.0000 mg | ORAL_TABLET | Freq: Two times a day (BID) | ORAL | 2 refills | Status: DC
  Filled 2021-11-29: qty 60, 30d supply, fill #0

## 2021-11-29 MED ORDER — ESOMEPRAZOLE MAGNESIUM 40 MG PO CPDR
DELAYED_RELEASE_CAPSULE | ORAL | 0 refills | Status: DC
  Filled 2021-11-29: qty 90, 90d supply, fill #0

## 2021-11-29 NOTE — Progress Notes (Unsigned)
Acute Office Visit  Subjective:     Patient ID: Nathaniel [name], male    DOB: 1965/06/05, 56 y.o.   MRN: [id number]  Chief Complaint  Patient presents with   throat problem    HPI Patient is in today for chronic throat clearing for months. He has days that are better and then worse days. He was sent to allergist and ENT and given 2 allergy medications and 2 nasal sprays and found out he was allergic to cats and grass in [place]. Treatment has not helped. Throat clearing is all throughout the day and not worse any one time. He denies any cough or drainage. He did increase his moisture in CPAP and seemed to help a little. No fever, chills, sinus pressure, congestion. He does admit to indigestion. He is on protonix 20mg  bid. No bowel changes, abdominal pain, melena or hematochezia.   . Active Ambulatory Problems    Diagnosis Date Noted   Fatigue 12/16/2008   Chronic headaches 09/28/2011   Seasonal and perennial allergic rhinitis 09/28/2011   MDD (major depressive disorder) 12/19/2012   Neck pain 02/27/2013   Cervical radiculitis 01/25/2015   Uncontrolled type 1 diabetes mellitus with hyperglycemia (HCC) 10/02/2015   Obstructive sleep apnea 06/09/2016   Pain in right knee 08/07/2018   Well adult exam 04/30/2019   GERD (gastroesophageal reflux disease) 11/21/2019   Closed avulsion fracture of left ankle 02/24/2020   Hyperlipidemia 08/11/2020   COVID-19 08/11/2020   Biceps tendinitis, left, distal 11/17/2020   Presence of insulin pump (external) (internal) 06/16/2021   Hypogonadism male 07/19/2021   Chronic throat clearing 07/20/2021   Resolved Ambulatory Problems    Diagnosis Date Noted   TINEA CRURIS 05/26/2009   RHINOSINUSITIS, ACUTE 03/23/2009   Headache(784.0) 12/16/2008   Dysuria 07/20/2009   BACK STRAIN, LUMBAR 12/17/2009   URI 04/23/2010   URI (upper respiratory infection) 02/02/2011   Back pain 04/16/2011   Hyperglycemia 04/16/2011   Abdominal pain, acute,  epigastric 06/23/2011   Axillary adenopathy 07/27/2011   Epididymal pain 12/19/2012   Diabetes mellitus (HCC) 12/19/2012   Herpes zoster 02/22/2013   Foot pain, right 09/05/2013   BPH (benign prostatic hyperplasia) 09/12/2014   Major depression in complete remission (HCC) 05/15/2015   Epistaxis 06/09/2016   Statin intolerance 07/03/2017   Anxiety and depression 07/03/2017   Scrotal rash 01/25/2019   Diarrhea 12/26/2019   Bruised toe 02/24/2020   Past Medical History:  Diagnosis Date   Depression    Diabetes mellitus    Sleep apnea    Type 1 diabetes mellitus, uncontrolled 10/02/2015      ROS See HPI.      Objective:    BP 124/77   Pulse 80   Wt 230 lb (104.3 kg)   SpO2 98%   BMI 31.19 kg/m  BP Readings from Last 3 Encounters:  11/29/21 124/77  09/17/21 123/73  07/20/21 118/76   Wt Readings from Last 3 Encounters:  11/29/21 230 lb (104.3 kg)  07/20/21 227 lb (103 kg)  07/20/21 225 lb (102.1 kg)      Physical Exam Constitutional:      Appearance: Normal appearance. He is obese.  HENT:     Head: Normocephalic.     Right Ear: Tympanic membrane, ear canal and external ear normal. There is no impacted cerumen.     Left Ear: Tympanic membrane, ear canal and external ear normal. There is no impacted cerumen.     Nose: Nose normal. No congestion or rhinorrhea.  Mouth/Throat:     Mouth: Mucous membranes are moist.  Eyes:     Extraocular Movements: Extraocular movements intact.     Pupils: Pupils are equal, round, and reactive to light.  Neck:     Vascular: No carotid bruit.  Cardiovascular:     Rate and Rhythm: Normal rate and regular rhythm.     Pulses: Normal pulses.     Heart sounds: Normal heart sounds.  Pulmonary:     Effort: Pulmonary effort is normal.     Breath sounds: Normal breath sounds.  Abdominal:     General: Bowel sounds are normal. There is no distension.     Palpations: Abdomen is soft. There is no mass.     Tenderness: There is no  abdominal tenderness. There is no right CVA tenderness, left CVA tenderness, guarding or rebound.     Hernia: No hernia is present.  Musculoskeletal:     Cervical back: Normal range of motion and neck supple. No tenderness.     Right lower leg: No edema.     Left lower leg: No edema.  Lymphadenopathy:     Cervical: Cervical adenopathy present.  Neurological:     General: No focal deficit present.     Mental Status: He is alert and oriented to person, place, and time.  Psychiatric:        Mood and Affect: Mood normal.         Assessment & Plan:  Marland KitchenMarland KitchenDamani was seen today for throat problem.  Diagnoses and all orders for this visit:  Chronic throat clearing -     esomeprazole (NEXIUM) 40 MG capsule; Take 1 capsule by mouth at dinner time. -     famotidine (PEPCID) 20 MG tablet; Take 1 tablet (20 mg total) by mouth 2 (two) times daily.  Treating allergies has not made much of a difference Another source could be GERD Stop protonix Start nexium 40mg  daily with pepcid 20mg  bid Consider GI consult and endoscopy if symptoms do not improve in the next 2-4 weeks.   , PA-C

## 2021-11-29 NOTE — Telephone Encounter (Signed)
Has an appointment today at 4:00. - tvt

## 2021-11-29 NOTE — Patient Instructions (Signed)
Stop pantoprazole. Start nexium 40mg  daily with pepcid twice a day.

## 2021-11-30 ENCOUNTER — Encounter: Payer: Self-pay | Admitting: Physician Assistant

## 2021-11-30 ENCOUNTER — Other Ambulatory Visit: Payer: Self-pay | Admitting: Internal Medicine

## 2021-11-30 ENCOUNTER — Encounter: Payer: Self-pay | Admitting: Internal Medicine

## 2021-11-30 ENCOUNTER — Other Ambulatory Visit (HOSPITAL_BASED_OUTPATIENT_CLINIC_OR_DEPARTMENT_OTHER): Payer: Self-pay

## 2021-11-30 MED ORDER — OZEMPIC (0.25 OR 0.5 MG/DOSE) 2 MG/3ML ~~LOC~~ SOPN
0.5000 mg | PEN_INJECTOR | SUBCUTANEOUS | 3 refills | Status: DC
  Filled 2021-11-30 – 2021-12-09 (×2): qty 9, 84d supply, fill #0

## 2021-12-01 ENCOUNTER — Other Ambulatory Visit (HOSPITAL_BASED_OUTPATIENT_CLINIC_OR_DEPARTMENT_OTHER): Payer: Self-pay

## 2021-12-02 ENCOUNTER — Other Ambulatory Visit (HOSPITAL_COMMUNITY): Payer: Self-pay

## 2021-12-02 ENCOUNTER — Ambulatory Visit: Payer: Managed Care, Other (non HMO) | Admitting: Internal Medicine

## 2021-12-02 ENCOUNTER — Telehealth: Payer: Self-pay

## 2021-12-02 ENCOUNTER — Ambulatory Visit: Payer: Managed Care, Other (non HMO) | Admitting: Family Medicine

## 2021-12-02 NOTE — Telephone Encounter (Signed)
Patient Advocate Encounter   Received notification from [employer] that prior authorization is required for Ozempic (0.25 or 0.5 MG/DOSE) 2MG /3ML pen-injectors  Submitted: 12-02-2021 Key 12-04-2021

## 2021-12-06 ENCOUNTER — Encounter: Payer: Self-pay | Admitting: Physician Assistant

## 2021-12-06 ENCOUNTER — Other Ambulatory Visit (HOSPITAL_BASED_OUTPATIENT_CLINIC_OR_DEPARTMENT_OTHER): Payer: Self-pay

## 2021-12-06 DIAGNOSIS — R0989 Other specified symptoms and signs involving the circulatory and respiratory systems: Secondary | ICD-10-CM

## 2021-12-06 DIAGNOSIS — K219 Gastro-esophageal reflux disease without esophagitis: Secondary | ICD-10-CM

## 2021-12-09 ENCOUNTER — Other Ambulatory Visit (HOSPITAL_BASED_OUTPATIENT_CLINIC_OR_DEPARTMENT_OTHER): Payer: Self-pay

## 2021-12-09 ENCOUNTER — Encounter: Payer: Self-pay | Admitting: Internal Medicine

## 2021-12-09 NOTE — Telephone Encounter (Signed)
Patient Advocate Encounter  Received a fax from [employer] regarding Prior Authorization for Ozempic (0.25 or 0.5 MG/DOSE) 2MG /3ML pen-injectors.   Authorization has been DENIED due to our guideline named GLP-1 STEP OVERRIDE (reviewed for Ozempic) requires that you have a diagnosis of type 2 diabetes mellitus (a chronic condition in which your body is resistant to insulin, a hormone that regulates your blood sugar).  Determination letter attached to patient chart.

## 2021-12-09 NOTE — Telephone Encounter (Signed)
Pt advised of determination. Thank you.

## 2021-12-13 ENCOUNTER — Encounter: Payer: 59 | Attending: Family Medicine | Admitting: Dietician

## 2021-12-13 DIAGNOSIS — E1065 Type 1 diabetes mellitus with hyperglycemia: Secondary | ICD-10-CM | POA: Diagnosis present

## 2021-12-13 NOTE — Progress Notes (Signed)
Medical Nutrition Therapy  Appointment Start time:  5643  Appointment End time:  3295 Patient is here today alone.  He was last seen by myself in 2019.  He has made many changes and is concerned that he is not losing weight.  He is on a high protein, low carb diet.  Aim for 30 grams of carbs per meal.  States low carb is the only way he has had success with his blood glucose.   No fast food or fried foods. Depression variable. Tracking on My Fitness Pal and eating under 1200 per day  Primary concerns today: He wants to lose weight and better control his blood sugar.    Referral diagnosis: Type 1 Diabetes Preferred learning style: no preference indicated Learning readiness: ready, change in progress   NUTRITION ASSESSMENT   Anthropometrics  72" 228 lbs 12/13/2021 230 lbs highest adult weight 190 lbs lowest adult weight 03/2017 Goal 185 lbs per patient.   Clinical Medical Hx: Type 1 Diabetes, OSA on -c-pap Medications: Lyumjev, Metformin, (Ozempic not approved), stopped Crestor due to wife's concerns that this was causing chronic throat clearing. Labs: 9.5% 06/01/2021, GFR >60 06/01/2021 Pump:  Medtronic CGM:  Libre 3 Notable Signs/Symptoms: BMI 30.9  Lifestyle & Dietary Hx Patient lives with his wife and son.  He does the shopping and much of the cooking.  They are remodeling their kitchen currently.   Patient works from home.  He is a therapist. Allergic to many foods as a child.  He currently does not avoid any foods due to allergies.  Estimated daily fluid intake: 10-40 oz Supplements: none Sleep: fair-good 7-8 Stress / self-care: good.  Making self care more of a priority Current average weekly physical activity: walks, gym, weights, elyptical at times most days of the week  24-Hr Dietary Recall Adkin's meals, no bread but eats low carb wraps First Meal: Jimmie Dean's [place] delight wrap Snack: occasional [employer] Protein bar OR NABS OR 1/2 apple Second Meal: low carb  wrap with meat and cheese OR Adkin's meal OR baked chicken and salad Snack: sugar free chocolate pudding OR lite and fit yogurt Third Meal: similar to lunch Snack: chicken nuggets occasionally or another salad Beverages: water, diet Pepsi (5-6/day)  Estimated Energy Needs Calories: 1400-1600 Carbohydrate: 158-180g Protein: 105-120g Fat: 39-44g   NUTRITION DIAGNOSIS  NB-1.1 Food and nutrition-related knowledge deficit As related to balance of nutrition and activity for weight loss.  As evidenced by patient report and diet hx.  Current intake in very hypercaloric.Marland Kitchen   NUTRITION INTERVENTION  Nutrition education (E-1) on the following topics:  Reviewed diet and discussed inadequacy of calories which can lower the metabolic rate  (patient frequently eating less than 1200 calories daily) Increased nutrient dense food choices Beverage choices to decrease artificial sweeteners with recommendations to increase water intake Plant based eating and benefits to improve insulin sensitivity Encouraged patient with behavior change that he has made and encouraged patient to continue to stay active.  Handouts Provided Include  [employer] of Lifestyle medicine  Meal plan card  Learning Style & Readiness for Change Teaching method utilized: Visual & Auditory  Demonstrated degree of understanding via: Teach Back  Barriers to learning/adherence to lifestyle change: food preferences, kitchen remodel  Goals Recommend weaning off diet Pepsi and drink more water (>64 oz of water).  True Lemon or True Lime may be an option.  Increase your vegetable intake.  Resources: Mastering Diabetes By Hildred Laser and [email or web address]  MONITORING & EVALUATION Dietary intake, weekly physical activity prn  Next Steps  Patient is to call or My Chart for questions.

## 2021-12-13 NOTE — Patient Instructions (Addendum)
Recommend weaning off diet Pepsi and drink more water (>64 oz of water).  True Lemon or True Lime may be an option.  Increase your vegetable intake.  Resources: Mastering Diabetes By Hildred Laser and [email or web address]

## 2021-12-17 ENCOUNTER — Other Ambulatory Visit: Payer: Self-pay | Admitting: Family Medicine

## 2021-12-17 ENCOUNTER — Other Ambulatory Visit (HOSPITAL_BASED_OUTPATIENT_CLINIC_OR_DEPARTMENT_OTHER): Payer: Self-pay

## 2021-12-17 MED ORDER — FEXOFENADINE HCL 30 MG/5ML PO SUSP
30.0000 mg | Freq: Every day | ORAL | 3 refills | Status: DC
  Filled 2021-12-17: qty 237, 47d supply, fill #0

## 2021-12-20 ENCOUNTER — Other Ambulatory Visit (HOSPITAL_BASED_OUTPATIENT_CLINIC_OR_DEPARTMENT_OTHER): Payer: Self-pay

## 2021-12-20 ENCOUNTER — Encounter: Payer: Self-pay | Admitting: Physician Assistant

## 2021-12-20 MED ORDER — FEXOFENADINE HCL 180 MG PO TABS
180.0000 mg | ORAL_TABLET | Freq: Every day | ORAL | 3 refills | Status: AC
  Filled 2021-12-20 – 2022-07-21 (×2): qty 90, 90d supply, fill #0

## 2021-12-21 DIAGNOSIS — R0989 Other specified symptoms and signs involving the circulatory and respiratory systems: Secondary | ICD-10-CM | POA: Diagnosis not present

## 2021-12-21 DIAGNOSIS — R09A2 Foreign body sensation, throat: Secondary | ICD-10-CM | POA: Diagnosis not present

## 2021-12-21 DIAGNOSIS — K219 Gastro-esophageal reflux disease without esophagitis: Secondary | ICD-10-CM | POA: Diagnosis not present

## 2021-12-28 ENCOUNTER — Telehealth: Payer: Self-pay | Admitting: Dietician

## 2021-12-28 ENCOUNTER — Encounter: Payer: Managed Care, Other (non HMO) | Admitting: Dietician

## 2021-12-28 NOTE — Telephone Encounter (Signed)
Returned patient call.   Patient states that he has not been loosing weight and is considering swimming.  He would like to know more about something to cover his infusion site. He uses a Medtronic insulin pump and Libre CGM.  He can find overpatches for the [place] on [place] or [place]   He called Medtronic who told him what to do with the medtronic infusion site while swimming.   He states that he is frustrated about not being able to lose weight.  He is tracking his intake in My Fitness Pal and at times eats less than 1200 calories.  He is now only eating when he is hungry.  Occasionally he is hungry after dinner and discussed my concerns that he is not eating adequate amounts and may be slowing his metabolism.  Recommended that he increase has calorie intake. He also states that he is always sore.  Expressed my concerns about muscle loss and encouraged him to increase his calories and fiber.  Barriers include:  kitchen currently being remodeled  Patient to call for further questions.  Antonieta Iba, RD, LDN, CDCES

## 2021-12-30 ENCOUNTER — Other Ambulatory Visit (HOSPITAL_BASED_OUTPATIENT_CLINIC_OR_DEPARTMENT_OTHER): Payer: Self-pay

## 2022-01-07 ENCOUNTER — Other Ambulatory Visit (HOSPITAL_BASED_OUTPATIENT_CLINIC_OR_DEPARTMENT_OTHER): Payer: Self-pay

## 2022-01-07 ENCOUNTER — Encounter: Payer: Self-pay | Admitting: Internal Medicine

## 2022-01-07 ENCOUNTER — Other Ambulatory Visit (HOSPITAL_COMMUNITY): Payer: Self-pay

## 2022-01-07 DIAGNOSIS — E1065 Type 1 diabetes mellitus with hyperglycemia: Secondary | ICD-10-CM

## 2022-01-07 MED ORDER — INSULIN LISPRO 100 UNIT/ML IJ SOLN
100.0000 [IU] | Freq: Every day | INTRAMUSCULAR | 1 refills | Status: DC
  Filled 2022-01-07: qty 90, 90d supply, fill #0

## 2022-01-08 ENCOUNTER — Other Ambulatory Visit (HOSPITAL_BASED_OUTPATIENT_CLINIC_OR_DEPARTMENT_OTHER): Payer: Self-pay

## 2022-01-10 ENCOUNTER — Encounter: Payer: Self-pay | Admitting: Family Medicine

## 2022-01-10 ENCOUNTER — Other Ambulatory Visit (HOSPITAL_BASED_OUTPATIENT_CLINIC_OR_DEPARTMENT_OTHER): Payer: Self-pay

## 2022-01-10 DIAGNOSIS — G4733 Obstructive sleep apnea (adult) (pediatric): Secondary | ICD-10-CM

## 2022-01-11 NOTE — Telephone Encounter (Signed)
Completed.

## 2022-01-13 ENCOUNTER — Encounter: Payer: Self-pay | Admitting: Internal Medicine

## 2022-01-13 ENCOUNTER — Ambulatory Visit (INDEPENDENT_AMBULATORY_CARE_PROVIDER_SITE_OTHER): Payer: BC Managed Care – PPO | Admitting: Internal Medicine

## 2022-01-13 VITALS — BP 118/76 | HR 82 | Ht 72.0 in | Wt 228.0 lb

## 2022-01-13 DIAGNOSIS — E663 Overweight: Secondary | ICD-10-CM

## 2022-01-13 DIAGNOSIS — E1065 Type 1 diabetes mellitus with hyperglycemia: Secondary | ICD-10-CM | POA: Diagnosis not present

## 2022-01-13 LAB — POCT GLYCOSYLATED HEMOGLOBIN (HGB A1C): Hemoglobin A1C: 8.5 % — AB (ref 4.0–5.6)

## 2022-01-13 NOTE — Addendum Note (Signed)
Addended by: Sarina Ill on: 01/13/2022 04:51 PM   Modules accepted: Orders

## 2022-01-13 NOTE — Patient Instructions (Addendum)
Please continue: - Metformin ER 2000 mg in a.m.  Please change the pump settings: - basal rates: 12 am: 1.5 >> 1:65 units per hour 7 am: 1.6 >> 1.7 9 pm: 1.6 >> 1.7 - ICR:   12 am: 1:5  4 pm: 1:5 10 pm: 1:6 >> 1:5 - target: 90-120 - ISF:   12 am: 20 >> 15  9 pm: 20 >> 15 - Insulin on Board: 3h  Continue: - Metformin 2000 mg daily  Please enter 50% of the protein amount as carbs if you have a low carb meal. Please reduce the bolus if you exercise after a meal to 30% (and adjust as needed).   Please reduce the basal rate  to 50 % 2h before exercise (and adjust as needed).  Enter ALL the carbs in the pump.  Try to do a dual wave bolus for fattier meals: 75%-25% over 2 hours.  Please return in 3-4 months.

## 2022-01-13 NOTE — Progress Notes (Signed)
Patient ID: Nathaniel [name], male   DOB: 23-Jan-1966, 56 y.o.   MRN: [id number]  HPI: Nathaniel [name] is a 56 y.o.-year-old man, presenting for f/u for DM1, dx as DM2 in 2001, and as DM1 in 07/2017, insulin-dependent since 2010, uncontrolled, without long-term complications. Last visit 7 months ago.  He saw another provider (Dr. Garnet Koyanagi) in 2020, but then decided to return to see me.  Interim history: Since last visit, he started again to exercise and also started to improve his diet: Stopped fast, carbs, sodas.  Sugars improved significantly.  However, he developed COVID 2.5 weeks ago and sugars increased and he is having a hard time bringing them down. No increased urination, blurry vision, nausea.  He has a hard time with his CPAP machine as he still has cough, and feels it needs to be adjusted.  Reviewed HbA1c levels: Lab Results  Component Value Date   HGBA1C 9.5 (A) 06/01/2021   HGBA1C 10.0 (A) 01/05/2021   HGBA1C 9.4 (A) 09/18/2020   HGBA1C 8.8 (A) 03/06/2020   HGBA1C 8.2 04/05/2019   HGBA1C 9.3 09/26/2018   HGBA1C 8.9 (A) 04/25/2018   HGBA1C 8.3 (H) 11/21/2017   HGBA1C 8.6 (A) 08/25/2017   HGBA1C 9.8 06/16/2017   HGBA1C 9.0 02/20/2017   HGBA1C 9.6 11/18/2016   HGBA1C 9.8 05/24/2016   HGBA1C 8.2 (H) 01/15/2016   HGBA1C 9.3 10/02/2015   HGBA1C 7.6 (H) 05/29/2015   HGBA1C 9.3 02/27/2015   HGBA1C 7.7 (H) 09/26/2014   HGBA1C 10.4 (H) 05/16/2014   HGBA1C 10.1 (H) 07/30/2013   10/2019: HbA1c 7.6%  11/08/2013: 9.5% 04/11/2013: 9.8%  Previously on: - Metformin 2000 mg daily in am. - Lantus 12 >> 14 units in am and 10 >> 14 units at bedtime >> Tresiba  - Humalog: 7-9 units depending on the size of the meal Inject Humalog 10-15 minutes before a meal. He was on Januvia which we stopped summer 2019. Stopped Tanzeum b/c N/V >> was in UC x2. Trulicity denied by insurance until he tried Victoza >> he did not want to try this.   Insulin pump:  -Medtronic 630G 07/2017 -he was  interested to switch to tandem T slim pump, but he was advised that he could only do so after 01/2019 -Started Medtronic 770G 03/2019  CGM: -Freestyle libre now  Insulin: -Humalog  Supplies: -Medtronic  He is now using his arm for infusion sets due to scar tissue on the abdomen. He tried to use his thighs, but this did not work well for him.  Pump settings: - basal rates: 12 am: 1.1 >> 1.4 >> 1.6 units/h 7 am: 1.7 >> 1.6 9 pm: 1.4 >> 1.6 - ICR:   12 am: 1:5  4 pm: 1:5 10 pm: 1:6 - target: 90-120 - ISF:   12 am: 20  9 pm: 20 - Insulin on Board: 3h  TDD from basal insulin: 45% (30 units) >> 50% >> 56% >> 61% >> 55% TDD from bolus insulin: 55% (35 units) >> 50% >> 44% >> 39% >> 45% TDD: 68-100 units >> 70-100 units - changes infusion site: Every 3 days - Meter: Bayer Contour   She is also on: - Metformin ER 2000 mg in a.m.  Pt.checks his sugars more than 4 times a day with his CGM:    Previously:  Previously:  Lowest: 41 at night >> ... 55 >> 40s >> 50s (exercise). Highest: >350 >> 390 >> 300s >> 300s.  Pt's meals are: - Breakfast: cereal +  1% milk, with protein bar >> sometimes scrambled eggs or cereals - Lunch: sandwich - Dinner: varies - Snacks: 1-2 a day  -No CKD, BUN/creatinine: Lab Results  Component Value Date   BUN 13 06/01/2021   CREATININE 1.09 06/01/2021   Normal GFR: Lab Results  Component Value Date   GFRNONAA >60 06/01/2021   GFRNONAA 88 04/30/2020   GFRNONAA 70 10/22/2018   GFRNONAA >60 06/30/2017   GFRNONAA 76 11/18/2016   GFRNONAA 87 01/15/2016   GFRNONAA 83 10/13/2015   GFRNONAA 87 08/14/2015   GFRNONAA >90 05/20/2013   No MAU: Lab Results  Component Value Date   MICRALBCREAT 1.6 01/05/2021   MICRALBCREAT 2.3 01/04/2018   MICRALBCREAT 1.3 11/18/2016   MICRALBCREAT 4.1 06/06/2014   MICRALBCREAT 5.3 12/19/2012   MICRALBCREAT 2.8 02/23/2011   MICRALBCREAT 10.2 05/28/2010   MICRALBCREAT 3.4 12/18/2008  On  lisinopril.  -No HL; last set of lipids: Lab Results  Component Value Date   CHOL 202 (H) 04/30/2020   HDL 59 04/30/2020   LDLCALC 120 (H) 04/30/2020   LDLDIRECT 78 10/22/2018   TRIG 121 04/30/2020   CHOLHDL 3.4 04/30/2020   - last eye exam was 2023: No DR reportedly  - He denies numbness and tingling in his feet.  He has occasional shooting pains in his feet.  Last foot exam was from 04/2021 by PCP. He had an ankle fracture 02/17/2020.  This has healed. He had a fibular fracture  09/28/2020 and afterwards he could not exercise and started to gain weight.    He has OSA and is compliant with the CPAP machine.  Reviewed latest TSH: Lab Results  Component Value Date   TSH 1.819 06/01/2021   Testosterone level was normal 04/2021.  ROS: + See HPI  I reviewed pt's medications, allergies, PMH, social hx, family hx, and changes were documented in the history of present illness. Otherwise, unchanged from my initial visit note.  Past Medical History:  Diagnosis Date   Biceps tendinitis, left, distal 11/17/2020   Cervical radiculitis 01/25/2015   Chronic headaches 09/28/2011   Closed avulsion fracture of left ankle 02/24/2020   COVID-19 08/11/2020   Depression    Diabetes mellitus    type II   Fatigue 12/16/2008   Qualifier: Diagnosis of  By: Wynona Luna    GERD (gastroesophageal reflux disease)    Headache(784.0)    Hyperlipidemia 08/11/2020   Hypogonadism male    Neck pain 02/27/2013   Obstructive sleep apnea 06/09/2016   Unattended Home Sleep Test 07/05/16-AHI 14.7/hour, desaturation to 79%, body weight 189 pounds.  AHI around 2.5 usually 04/23/18    Pain in right knee 08/07/2018   Presence of insulin pump (external) (internal) 06/16/2021   Seasonal and perennial allergic rhinitis 09/28/2011   Sleep apnea    CPAP   Type 1 diabetes mellitus, uncontrolled 10/02/2015   Past Surgical History:  Procedure Laterality Date   NASAL SINUS SURGERY  04/1991    TONSILLECTOMY  1972   Social History   Socioeconomic History   Marital status: Married    Spouse name: Oncologist   Number of children: 2   Years of education: Not on file   Highest education level: Not on file  Occupational History    Employer: [employer]    Comment: Mental Health Case Management  Tobacco Use   Smoking status: Never    Passive exposure: Never   Smokeless tobacco: Never  Vaping Use   Vaping Use: Never used  Substance and Sexual Activity   Alcohol use: No   Drug use: No   Sexual activity: Not on file  Other Topics Concern   Not on file  Social History Narrative   He lives with wife and two children.   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   Highest level of education: master degree   Caffeine- diet soda 2 L a day   Social Determinants of Health   Financial Resource Strain: Not on file  Food Insecurity: Not on file  Transportation Needs: Not on file  Physical Activity: Not on file  Stress: Not on file  Social Connections: Not on file  Intimate Partner Violence: Not on file   Current Outpatient Medications on File Prior to Visit  Medication Sig Dispense Refill   azelastine (ASTELIN) 0.1 % nasal spray Place 2 sprays into both nostrils 2 (two) times daily. Use in each nostril as directed 30 mL 1   Continuous Blood Gluc Sensor (FREESTYLE LIBRE 3 SENSOR) MISC Apply on to the skin every 14 days. 6 each 0   COVID-19 At Home Antigen Test (CARESTART COVID-19 HOME TEST) KIT Use as directed per package instructions 8 each 0   esomeprazole (NEXIUM) 40 MG capsule Take 1 capsule by mouth at dinner time. 90 capsule 0   famotidine (PEPCID) 20 MG tablet Take 1 tablet (20 mg total) by mouth 2 (two) times daily. 60 tablet 2   fexofenadine (ALLEGRA) 180 MG tablet Take 1 tablet (180 mg total) by mouth daily. 90 tablet 3   FLUoxetine (PROZAC) 20 MG tablet One tab PO daily for 2 weeks, then one half tab PO daily for 2 weeks, the one  half tab PO every other day for 2 weeks then stop. (Patient not taking: Reported on 12/13/2021) 25 tablet 0   insulin lispro (HUMALOG) 100 UNIT/ML injection Inject 1 mL (100 Units total) into the skin daily. 90 mL 1   Insulin Lispro-aabc (LYUMJEV) 100 UNIT/ML SOLN Use up to 100 units a day in the insulin pump. 90 mL 3   metFORMIN (GLUCOPHAGE-XR) 500 MG 24 hr tablet Take 4 tablets (2,000 mg total) by mouth daily with breakfast. 360 tablet 3   montelukast (SINGULAIR) 10 MG tablet Take 10 mg by mouth at bedtime.     MULTIPLE VITAMIN PO Take 1 tablet by mouth daily.     pantoprazole (PROTONIX) 20 MG tablet Take 20 mg by mouth 2 (two) times daily. (Patient not taking: Reported on 12/13/2021)     rosuvastatin (CRESTOR) 20 MG tablet Take 1 tablet (20 mg total) by mouth daily. (Patient not taking: Reported on 12/13/2021) 90 tablet 3   Semaglutide,0.25 or 0.5MG /DOS, (OZEMPIC, 0.25 OR 0.5 MG/DOSE,) 2 MG/3ML SOPN Inject 0.5 mg into the skin once a week. 9 mL 3   No current facility-administered medications on file prior to visit.   Allergies  Allergen Reactions   Cymbalta [Duloxetine Hcl] Other (See Comments)    Suicidal thoughts   Influenza Vaccines Other (See Comments)    Unknown reaction   Penicillins Other (See Comments)    Unknown - childhood allergy per mother   Tobacco Swelling    Arm swelled up with allergy testing    Ace Inhibitors Swelling    Angioedema with lisinopril   Iodinated Contrast Media Hives and Itching   Pioglitazone Other (See Comments)      headache   Promethazine Hcl Nausea And Vomiting        Reglan [Metoclopramide] Other (  See Comments)    Jittery, increased anxiety   Family History  Adopted: Yes  Problem Relation Age of Onset   Cancer Father 44       Prostate cancer   Diabetes Mellitus I Son    PE: BP 118/76 (BP Location: Left Arm, Patient Position: Sitting, Cuff Size: Normal)   Pulse 82   Ht 6' (1.829 m)   Wt 228 lb (103.4 kg)   SpO2 97%   BMI 30.92 kg/m     Wt Readings from Last 3 Encounters:  01/13/22 228 lb (103.4 kg)  12/13/21 228 lb (103.4 kg)  11/29/21 230 lb (104.3 kg)   Constitutional: overweight, in NAD Eyes:  EOMI, no exophthalmos ENT: no neck masses, no cervical lymphadenopathy Cardiovascular: RRR, No MRG Respiratory: CTA B Musculoskeletal: no deformities Skin:no rashes Neurological: no tremor with outstretched hands  ASSESSMENT: 1. DM1, uncontrolled, without complications - C-peptide of 3.67 (0.8-3.9), from 06/16/2005 ([facility]). No associated glucose reported. - son with DM1 (also on an insulin pump)  Component     Latest Ref Rng & Units 08/17/2017  Glutamic Acid Decarb Ab     <5 IU/mL 25 (H)  Glucose, Plasma     65 - 99 mg/dL 102 (H)  C-Peptide     0.80 - 3.85 ng/mL 0.14 (L)  ZNT8 Antibodies     U/mL <15  Islet Cell Ab     Neg:<1:1 Negative   2.  Overweight  PLAN:   1. Patient with longstanding, type 1 diabetes, on Medtronic 770 G insulin pump and also metformin.  He previously switched between the pump and insulin injections but he restarted the pump before last visit.  His blood sugars are usually better while on the insulin pump.  At last visit, sugars were very fluctuating during the night and upon questioning, they were dropping too much so he had to decrease his basal rates.  However, I feel that the decrease was excessive and the sugars were quite high overnight.  I advised him to increase the basal rates overnight, but discussed about reducing the correction of high blood sugars at bedtime.  Sugars are high after meals but he appears not to enter all of the carbs into the pump and not with every meal, only approximately once a day.  We discussed about entering all of the carbs into the pump, and, if on the low carb diet, to introduce 50% of the proteins less carbs.  Otherwise, we did not change his regimen.  HbA1c at that time was only slightly lower, at 9.5%, still above target CGM interpretation: -At  today's visit, we reviewed his CGM downloads: It appears that 35% of values are in target range (goal >70%), while 65% are higher than 180 (goal <25%), and 0% are lower than 70 (goal <4%).  The calculated average blood sugar is 202.  The projected HbA1c for the next 3 months (GMI) is 8.1%. -Reviewing the CGM trends, sugars are fluctuating around the upper limit of the target range in the last 2 weeks.  However, per patient's report, they were excellent before his COVID-19 episode.  At that time he was doing a good job with his diet and exercise.  He still is symptomatic with shortness of breath and cough, however, afterwards, plans to return to the previous diet and exercise.  For now, since sugars remain high, I advised him to increase the basal rates throughout the day and night, will also strengthen his insulin to carb ratio at 10 PM  in case he needs at night and, since he feels that his corrections are not very effective, we strengthened his insulin sensitivity factor. -Reviewing his pump downloads, it appears that he enters smaller amount of carbs before meals and I again reminded him to try to enter 50% of the protein amounts of carbs if he has low carbs meals. -Also, since he is describing postexercise hypoglycemia, we discussed about different strategies to prevent this: If he exercises after a meal, to reduce the bolus to approximately 30% and then adjust further depending on the results If he exercises before a meal, to reduce the basal rate of 50% 2 hours before exercise and adjust further depending on the results If the sugars are normal at the start of exercise to start with strength training and then cardio.  He does mention that even after strength training, his sugars are not high, but they may still drop, this will be the least recommended option - I advised him to: Patient Instructions  Please continue: - Metformin ER 2000 mg in a.m.  Please change the pump settings: - basal rates: 12  am: 1.5 >> 1:65 units per hour 7 am: 1.6 >> 1.7 9 pm: 1.6 >> 1.7 - ICR:   12 am: 1:5  4 pm: 1:5 10 pm: 1:6 >> 1:5 - target: 90-120 - ISF:   12 am: 20 >> 15  9 pm: 20 >> 15 - Insulin on Board: 3h  Continue: - Metformin 2000 mg daily  Please enter 50% of the protein amount as carbs if you have a low carb meal. Please reduce the bolus if you exercise after a meal to 30% (and adjust as needed).   Please reduce the basal rate  to 50 % 2h before exercise (and adjust as needed).  Enter ALL the carbs in the pump.  Try to do a dual wave bolus for fattier meals: 75%-25% over 2 hours.  Please return in 3-4 months.   - we checked his HbA1c: 8.5% (lower) - advised to check sugars at different times of the day - 4x a day, rotating check times - advised for yearly eye exams >> he is UTD - return to clinic in 3-4 months  2. Overweight -He was on Tanzeum in the past but had nausea.  Tanzeum is now out of the market. -Since last visit we tried again Ozempic but this was not covered by his insurance. -he continues on metformin which is weight stabilizing long-term -I did suggest in the past referral to weight management clinic but he declined -He lost 5 pounds before last visit and gained 4 pounds since last visit -He did start to adjust his diet and exercise consistently before his COVID-19 episode >> will restart when symptom-free  + flu shot today  Philemon Kingdom, MD PhD [facility] Endocrinology

## 2022-01-17 ENCOUNTER — Other Ambulatory Visit (HOSPITAL_BASED_OUTPATIENT_CLINIC_OR_DEPARTMENT_OTHER): Payer: Self-pay

## 2022-01-17 MED ORDER — SUCRALFATE 1 G PO TABS
1.0000 g | ORAL_TABLET | Freq: Four times a day (QID) | ORAL | 0 refills | Status: DC | PRN
  Filled 2022-01-17: qty 120, 30d supply, fill #0

## 2022-01-18 ENCOUNTER — Other Ambulatory Visit (HOSPITAL_BASED_OUTPATIENT_CLINIC_OR_DEPARTMENT_OTHER): Payer: Self-pay

## 2022-01-18 DIAGNOSIS — J9811 Atelectasis: Secondary | ICD-10-CM | POA: Diagnosis not present

## 2022-01-18 DIAGNOSIS — R059 Cough, unspecified: Secondary | ICD-10-CM | POA: Diagnosis not present

## 2022-01-19 ENCOUNTER — Ambulatory Visit: Payer: BC Managed Care – PPO | Admitting: Family Medicine

## 2022-01-20 ENCOUNTER — Ambulatory Visit (INDEPENDENT_AMBULATORY_CARE_PROVIDER_SITE_OTHER): Payer: BC Managed Care – PPO | Admitting: Family Medicine

## 2022-01-20 ENCOUNTER — Encounter: Payer: Self-pay | Admitting: Family Medicine

## 2022-01-20 ENCOUNTER — Ambulatory Visit: Payer: Managed Care, Other (non HMO) | Admitting: Family Medicine

## 2022-01-20 ENCOUNTER — Other Ambulatory Visit (HOSPITAL_BASED_OUTPATIENT_CLINIC_OR_DEPARTMENT_OTHER): Payer: Self-pay

## 2022-01-20 VITALS — BP 127/79 | HR 61 | Temp 97.6°F | Ht 73.0 in | Wt 228.0 lb

## 2022-01-20 DIAGNOSIS — R058 Other specified cough: Secondary | ICD-10-CM | POA: Diagnosis not present

## 2022-01-20 MED ORDER — BENZONATATE 200 MG PO CAPS
200.0000 mg | ORAL_CAPSULE | Freq: Two times a day (BID) | ORAL | 1 refills | Status: DC | PRN
  Filled 2022-01-20: qty 20, 10d supply, fill #0

## 2022-01-20 MED ORDER — BUDESONIDE-FORMOTEROL FUMARATE 160-4.5 MCG/ACT IN AERO
2.0000 | INHALATION_SPRAY | Freq: Two times a day (BID) | RESPIRATORY_TRACT | 3 refills | Status: DC
  Filled 2022-01-20: qty 10.2, 30d supply, fill #0

## 2022-01-20 NOTE — Assessment & Plan Note (Signed)
Symptoms consistent with postviral cough syndrome.  Reflux may be contributing some as well.  Recommend continued supportive care with increase fluids and humidifier.  Adding Tessalon Perles as well as Symbicort.  I am concerned that ystemic steroids will drive his blood sugars up too much.  He can continue albuterol as needed.  He can increase his Nexium to 80 mg for couple weeks to see if this helps with his continued reflux symptoms.  He does have upcoming endoscopy.  We did discuss that cough may last for 6 to 8 weeks.

## 2022-01-20 NOTE — Patient Instructions (Addendum)
Start symbicort daily Work on deep breathing.  Try tessalon as needed for cough.  Use delsym over the counter.  Use humidifier in the house.  Increase nexium to 80mg  for the next 2 weeks.  See me again in about 2 weeks.

## 2022-01-20 NOTE — Progress Notes (Signed)
Nathaniel [name] - 56 y.o. male MRN [id number]  Date of birth: 06-16-1965  Subjective Chief Complaint  Patient presents with   Cough    HPI Nathaniel [name] is a 56 year old male here today for follow-up of recent urgent care visit.  He has complaint of cough.  He has had cough for about 3 weeks.  This started after COVID.  He has also noted some increased reflux symptoms.  Urgent care did start him on doxycycline with albuterol inhaler.  Also added Nexium and Carafate.  He reports that he has not noted any improvement in his symptoms.  He does have some dyspnea when coughing.  He has not noted any fever or chills.  Denies chest pain.  Chest x-ray from urgent care does show some bibasilar atelectasis.  ROS:  A comprehensive ROS was completed and negative except as noted per HPI  Allergies  Allergen Reactions   Cymbalta [Duloxetine Hcl] Other (See Comments)    Suicidal thoughts   Influenza Vaccines Other (See Comments)    Unknown reaction   Penicillins Other (See Comments)    Unknown - childhood allergy per mother   Tobacco Swelling    Arm swelled up with allergy testing    Ace Inhibitors Swelling    Angioedema with lisinopril   Iodinated Contrast Media Hives and Itching   Pioglitazone Other (See Comments)      headache   Promethazine Hcl Nausea And Vomiting        Reglan [Metoclopramide] Other (See Comments)    Jittery, increased anxiety    Past Medical History:  Diagnosis Date   Biceps tendinitis, left, distal 11/17/2020   Cervical radiculitis 01/25/2015   Chronic headaches 09/28/2011   Closed avulsion fracture of left ankle 02/24/2020   COVID-19 08/11/2020   Depression    Diabetes mellitus    type II   Fatigue 12/16/2008   Qualifier: Diagnosis of  By: Wynona Luna    GERD (gastroesophageal reflux disease)    Headache(784.0)    Hyperlipidemia 08/11/2020   Hypogonadism male    Neck pain 02/27/2013   Obstructive sleep apnea 06/09/2016   Unattended Home Sleep Test  07/05/16-AHI 14.7/hour, desaturation to 79%, body weight 189 pounds.  AHI around 2.5 usually 04/23/18    Pain in right knee 08/07/2018   Presence of insulin pump (external) (internal) 06/16/2021   Seasonal and perennial allergic rhinitis 09/28/2011   Sleep apnea    CPAP   Type 1 diabetes mellitus, uncontrolled 10/02/2015    Past Surgical History:  Procedure Laterality Date   NASAL SINUS SURGERY  04/1991   TONSILLECTOMY  1972    Social History   Socioeconomic History   Marital status: Married    Spouse name: Oncologist   Number of children: 2   Years of education: Not on file   Highest education level: Not on file  Occupational History    Employer: [employer]    Comment: Mental Health Case Management  Tobacco Use   Smoking status: Never    Passive exposure: Never   Smokeless tobacco: Never  Vaping Use   Vaping Use: Never used  Substance and Sexual Activity   Alcohol use: No   Drug use: No   Sexual activity: Not on file  Other Topics Concern   Not on file  Social History Narrative   He lives with wife and two children.   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   Highest level of education: Restaurant manager, fast food degree  Caffeine- diet soda 2 L a day   Social Determinants of Health   Financial Resource Strain: Not on file  Food Insecurity: Not on file  Transportation Needs: Not on file  Physical Activity: Not on file  Stress: Not on file  Social Connections: Not on file    Family History  Adopted: Yes  Problem Relation Age of Onset   Cancer Father 48       Prostate cancer   Diabetes Mellitus I Son     Health Maintenance  Topic Date Due   Diabetic kidney evaluation - Urine ACR  01/21/2022 (Originally 01/05/2022)   COVID-19 Vaccine (4 - Moderna risk series) 02/05/2022 (Originally 03/16/2020)   Zoster Vaccines- Shingrix (1 of 2) 04/22/2022 (Originally 09/02/1984)   INFLUENZA VACCINE  06/19/2022 (Originally 10/19/2021)   Hepatitis C  Screening  07/21/2022 (Originally 09/03/1983)   HIV Screening  07/21/2022 (Originally 09/02/1980)   TETANUS/TDAP  01/21/2023 (Originally 03/01/2021)   OPHTHALMOLOGY EXAM  03/29/2022   FOOT EXAM  04/22/2022   Diabetic kidney evaluation - GFR measurement  06/02/2022   HEMOGLOBIN A1C  07/15/2022   Fecal DNA (Cologuard)  05/21/2023   HPV VACCINES  Aged Out     ----------------------------------------------------------------------------------------------------------------------------------------------------------------------------------------------------------------- Physical Exam BP 127/79   Pulse 61   Temp 97.6 F (36.4 C)   Ht 6\' 1"  (1.854 m)   Wt 228 lb (103.4 kg)   SpO2 99%   BMI 30.08 kg/m   Physical Exam Constitutional:      Appearance: Normal appearance.  HENT:     Head: Normocephalic and atraumatic.     Mouth/Throat:     Mouth: Mucous membranes are moist.  Eyes:     General: No scleral icterus. Cardiovascular:     Rate and Rhythm: Normal rate and regular rhythm.  Pulmonary:     Effort: Pulmonary effort is normal.     Breath sounds: Normal breath sounds.  Musculoskeletal:     Cervical back: Neck supple.  Neurological:     Mental Status: He is alert.  Psychiatric:        Mood and Affect: Mood normal.        Behavior: Behavior normal.     ------------------------------------------------------------------------------------------------------------------------------------------------------------------------------------------------------------------- Assessment and Plan  Post-viral cough syndrome Symptoms consistent with postviral cough syndrome.  Reflux may be contributing some as well.  Recommend continued supportive care with increase fluids and humidifier.  Adding Tessalon Perles as well as Symbicort.  I am concerned that ystemic steroids will drive his blood sugars up too much.  He can continue albuterol as needed.  He can increase his Nexium to 80 mg for couple  weeks to see if this helps with his continued reflux symptoms.  He does have upcoming endoscopy.  We did discuss that cough may last for 6 to 8 weeks.   Meds ordered this encounter  Medications   budesonide-formoterol (SYMBICORT) 160-4.5 MCG/ACT inhaler    Sig: Inhale 2 puffs into the lungs 2 (two) times daily.    Dispense:  10.2 g    Refill:  3   benzonatate (TESSALON) 200 MG capsule    Sig: Take 1 capsule (200 mg total) by mouth 2 (two) times daily as needed for cough.    Dispense:  20 capsule    Refill:  1    Return for 2-3 week f/u for Cough.    This visit occurred during the SARS-CoV-2 public health emergency.  Safety protocols were in place, including screening questions prior to the visit, additional usage of staff  PPE, and extensive cleaning of exam room while observing appropriate contact time as indicated for disinfecting solutions.

## 2022-01-25 ENCOUNTER — Other Ambulatory Visit: Payer: Self-pay | Admitting: Physician Assistant

## 2022-01-25 DIAGNOSIS — R0989 Other specified symptoms and signs involving the circulatory and respiratory systems: Secondary | ICD-10-CM

## 2022-01-26 ENCOUNTER — Encounter: Payer: Self-pay | Admitting: Family Medicine

## 2022-01-26 ENCOUNTER — Other Ambulatory Visit (HOSPITAL_BASED_OUTPATIENT_CLINIC_OR_DEPARTMENT_OTHER): Payer: Self-pay

## 2022-01-26 MED ORDER — ESOMEPRAZOLE MAGNESIUM 40 MG PO CPDR
40.0000 mg | DELAYED_RELEASE_CAPSULE | Freq: Every day | ORAL | 0 refills | Status: DC
  Filled 2022-01-26 – 2022-02-01 (×2): qty 90, 90d supply, fill #0

## 2022-01-27 ENCOUNTER — Other Ambulatory Visit (HOSPITAL_BASED_OUTPATIENT_CLINIC_OR_DEPARTMENT_OTHER): Payer: Self-pay

## 2022-01-28 ENCOUNTER — Other Ambulatory Visit (HOSPITAL_BASED_OUTPATIENT_CLINIC_OR_DEPARTMENT_OTHER): Payer: Self-pay

## 2022-02-01 ENCOUNTER — Other Ambulatory Visit (HOSPITAL_BASED_OUTPATIENT_CLINIC_OR_DEPARTMENT_OTHER): Payer: Self-pay

## 2022-02-02 ENCOUNTER — Encounter: Payer: Self-pay | Admitting: Internal Medicine

## 2022-02-03 ENCOUNTER — Other Ambulatory Visit: Payer: Self-pay | Admitting: Internal Medicine

## 2022-02-03 ENCOUNTER — Ambulatory Visit: Payer: BC Managed Care – PPO | Admitting: Family Medicine

## 2022-02-03 ENCOUNTER — Other Ambulatory Visit (HOSPITAL_BASED_OUTPATIENT_CLINIC_OR_DEPARTMENT_OTHER): Payer: Self-pay

## 2022-02-03 MED ORDER — LANTUS SOLOSTAR 100 UNIT/ML ~~LOC~~ SOPN: 40.0000 [IU] | PEN_INJECTOR | Freq: Every day | SUBCUTANEOUS | 3 refills | Status: DC

## 2022-02-03 MED ORDER — HUMALOG KWIKPEN 200 UNIT/ML ~~LOC~~ SOPN: PEN_INJECTOR | SUBCUTANEOUS | 3 refills | Status: DC

## 2022-02-04 ENCOUNTER — Encounter: Payer: Self-pay | Admitting: Family Medicine

## 2022-02-04 DIAGNOSIS — E119 Type 2 diabetes mellitus without complications: Secondary | ICD-10-CM | POA: Diagnosis not present

## 2022-02-04 DIAGNOSIS — K219 Gastro-esophageal reflux disease without esophagitis: Secondary | ICD-10-CM | POA: Diagnosis not present

## 2022-02-08 ENCOUNTER — Encounter: Payer: Self-pay | Admitting: Internal Medicine

## 2022-02-08 ENCOUNTER — Telehealth: Payer: Self-pay

## 2022-02-08 NOTE — Telephone Encounter (Signed)
Initiated Prior authorization VOP:[id number] Magnesium 40MG  dr capsules Via: Covermymeds Case/Key:[id number] Status: approved  as of 02/08/22 Reason:valid through 02/03/23 Notified Pt via: Mychart

## 2022-02-09 NOTE — Telephone Encounter (Signed)
Insurance information was corrected in the system and patient called to let him know.  Patient will call the billing number on his statement so c laim will be filed to corrected insurance.

## 2022-02-16 ENCOUNTER — Other Ambulatory Visit: Payer: Self-pay | Admitting: Internal Medicine

## 2022-02-16 ENCOUNTER — Other Ambulatory Visit (HOSPITAL_BASED_OUTPATIENT_CLINIC_OR_DEPARTMENT_OTHER): Payer: Self-pay

## 2022-02-16 DIAGNOSIS — E1065 Type 1 diabetes mellitus with hyperglycemia: Secondary | ICD-10-CM

## 2022-02-16 MED ORDER — FREESTYLE LIBRE 3 SENSOR MISC
2.0000 | 0 refills | Status: AC
  Filled 2022-02-16: qty 2, 28d supply, fill #0
  Filled 2022-03-16: qty 2, 28d supply, fill #1
  Filled 2022-07-21: qty 2, 28d supply, fill #2

## 2022-02-23 ENCOUNTER — Encounter: Payer: Self-pay | Admitting: Family Medicine

## 2022-02-23 ENCOUNTER — Encounter: Payer: Self-pay | Admitting: Internal Medicine

## 2022-02-23 DIAGNOSIS — E1065 Type 1 diabetes mellitus with hyperglycemia: Secondary | ICD-10-CM

## 2022-02-23 NOTE — Telephone Encounter (Signed)
Referral signed.

## 2022-03-01 NOTE — Telephone Encounter (Signed)
Message given to practice admin to follow up further.

## 2022-03-03 DIAGNOSIS — J984 Other disorders of lung: Secondary | ICD-10-CM | POA: Diagnosis not present

## 2022-03-03 DIAGNOSIS — E669 Obesity, unspecified: Secondary | ICD-10-CM | POA: Diagnosis not present

## 2022-03-03 DIAGNOSIS — G4733 Obstructive sleep apnea (adult) (pediatric): Secondary | ICD-10-CM | POA: Diagnosis not present

## 2022-03-03 DIAGNOSIS — R0989 Other specified symptoms and signs involving the circulatory and respiratory systems: Secondary | ICD-10-CM | POA: Diagnosis not present

## 2022-03-11 DIAGNOSIS — G4733 Obstructive sleep apnea (adult) (pediatric): Secondary | ICD-10-CM | POA: Diagnosis not present

## 2022-03-16 ENCOUNTER — Other Ambulatory Visit: Payer: Self-pay

## 2022-03-31 ENCOUNTER — Other Ambulatory Visit (HOSPITAL_BASED_OUTPATIENT_CLINIC_OR_DEPARTMENT_OTHER): Payer: Self-pay

## 2022-03-31 DIAGNOSIS — Z9641 Presence of insulin pump (external) (internal): Secondary | ICD-10-CM | POA: Diagnosis not present

## 2022-03-31 DIAGNOSIS — E109 Type 1 diabetes mellitus without complications: Secondary | ICD-10-CM | POA: Diagnosis not present

## 2022-03-31 DIAGNOSIS — E785 Hyperlipidemia, unspecified: Secondary | ICD-10-CM | POA: Diagnosis not present

## 2022-03-31 DIAGNOSIS — E1065 Type 1 diabetes mellitus with hyperglycemia: Secondary | ICD-10-CM | POA: Diagnosis not present

## 2022-03-31 LAB — MICROALBUMIN / CREATININE URINE RATIO: Microalb Creat Ratio: <12

## 2022-03-31 LAB — COMPREHENSIVE METABOLIC PANEL
Albumin: 4.8 (ref 3.5–5.0)
Calcium: 9.7 (ref 8.7–10.7)
Globulin: 2.7
eGFR: 73

## 2022-03-31 LAB — BASIC METABOLIC PANEL
BUN: 14 (ref 4–21)
CO2: 23 — AB (ref 13–22)
Chloride: 98 — AB (ref 99–108)
Creatinine: 1.2 (ref 0.6–1.3)
Glucose: 213
Potassium: 4.4 meq/L (ref 3.5–5.1)
Sodium: 139 (ref 137–147)

## 2022-03-31 LAB — PROTEIN / CREATININE RATIO, URINE
Albumin, U: <3
Creatinine, Urine: 25.1

## 2022-03-31 LAB — HEPATIC FUNCTION PANEL
ALT: 31 U/L (ref 10–40)
AST: 27 (ref 14–40)
Alkaline Phosphatase: 86 (ref 25–125)
Bilirubin, Total: 0.8

## 2022-03-31 LAB — LIPID PANEL
Cholesterol: 236 — AB (ref 0–200)
HDL: 60 (ref 35–70)
LDL Cholesterol: 30
Triglycerides: 168 — AB (ref 40–160)

## 2022-04-01 ENCOUNTER — Other Ambulatory Visit (HOSPITAL_BASED_OUTPATIENT_CLINIC_OR_DEPARTMENT_OTHER): Payer: Self-pay

## 2022-04-01 MED ORDER — ROSUVASTATIN CALCIUM 10 MG PO TABS
10.0000 mg | ORAL_TABLET | Freq: Every day | ORAL | 1 refills | Status: DC
  Filled 2022-04-01: qty 90, 90d supply, fill #0
  Filled 2022-07-12: qty 90, 90d supply, fill #1

## 2022-04-01 MED ORDER — INSULIN LISPRO 100 UNIT/ML IJ SOLN
80.0000 [IU] | Freq: Every day | INTRAMUSCULAR | 3 refills | Status: DC
  Filled 2022-04-01: qty 50, 62d supply, fill #0
  Filled 2022-05-28: qty 50, 62d supply, fill #1
  Filled 2022-08-02: qty 50, 62d supply, fill #2
  Filled 2022-09-19: qty 50, 62d supply, fill #3

## 2022-04-05 ENCOUNTER — Other Ambulatory Visit (HOSPITAL_BASED_OUTPATIENT_CLINIC_OR_DEPARTMENT_OTHER): Payer: Self-pay

## 2022-04-05 MED ORDER — INSULIN LISPRO 100 UNIT/ML IJ SOLN
100.0000 [IU] | Freq: Every day | INTRAMUSCULAR | 1 refills | Status: DC
  Filled 2022-04-05: qty 90, 90d supply, fill #0

## 2022-04-06 ENCOUNTER — Encounter: Payer: Self-pay | Admitting: Family Medicine

## 2022-04-06 ENCOUNTER — Other Ambulatory Visit (HOSPITAL_BASED_OUTPATIENT_CLINIC_OR_DEPARTMENT_OTHER): Payer: Self-pay

## 2022-04-11 DIAGNOSIS — G4733 Obstructive sleep apnea (adult) (pediatric): Secondary | ICD-10-CM | POA: Diagnosis not present

## 2022-04-21 ENCOUNTER — Other Ambulatory Visit (HOSPITAL_BASED_OUTPATIENT_CLINIC_OR_DEPARTMENT_OTHER): Payer: Self-pay

## 2022-04-21 MED ORDER — FREESTYLE LIBRE 3 SENSOR MISC
11 refills | Status: DC
  Filled 2022-04-21: qty 2, 28d supply, fill #0
  Filled 2022-06-19: qty 2, 28d supply, fill #1
  Filled 2022-08-29: qty 2, 28d supply, fill #2
  Filled 2022-09-27 (×2): qty 2, 28d supply, fill #3
  Filled 2022-10-24: qty 2, 28d supply, fill #4
  Filled 2022-11-30: qty 2, 28d supply, fill #5
  Filled 2023-01-04: qty 2, 28d supply, fill #6
  Filled 2023-02-27: qty 2, 28d supply, fill #7
  Filled 2023-03-27: qty 2, 28d supply, fill #8

## 2022-04-23 ENCOUNTER — Telehealth: Payer: BC Managed Care – PPO

## 2022-04-25 ENCOUNTER — Ambulatory Visit (INDEPENDENT_AMBULATORY_CARE_PROVIDER_SITE_OTHER): Payer: BC Managed Care – PPO

## 2022-04-25 ENCOUNTER — Ambulatory Visit
Admission: EM | Admit: 2022-04-25 | Discharge: 2022-04-25 | Disposition: A | Discharge status: Home / Self Care | Payer: BC Managed Care – PPO | Attending: Family Medicine | Admitting: Family Medicine

## 2022-04-25 DIAGNOSIS — M25562 Pain in left knee: Secondary | ICD-10-CM

## 2022-04-25 DIAGNOSIS — M7050 Other bursitis of knee, unspecified knee: Secondary | ICD-10-CM

## 2022-04-25 NOTE — ED Provider Notes (Signed)
[name]    CSN: [id number] Arrival date & time: 04/25/22  1824      History   Chief Complaint Chief Complaint  Patient presents with   Knee Pain    LT    HPI Nathaniel [name] is a 57 y.o. male.   Patient complains of pain in his left knee for four days.  He denies swelling of his knee and points to an area below and medial to the patella.  The pain is worse when walking.  He denies injury and changes in activities, although he admits that he walked on a treadmill two days in a row last week.  The history is provided by the patient.  Knee Pain Location:  Knee Time since incident:  4 days Injury: no   Knee location:  L knee Pain details:    Quality:  Aching   Radiates to:  Does not radiate   Severity:  Mild   Onset quality:  Sudden   Duration:  4 days   Timing:  Constant   Progression:  Unchanged Chronicity:  New Prior injury to area:  No Relieved by:  Nothing Worsened by:  Exercise, flexion and activity Ineffective treatments:  NSAIDs Associated symptoms: no decreased ROM, no muscle weakness, no stiffness and no swelling     Past Medical History:  Diagnosis Date   Biceps tendinitis, left, distal 11/17/2020   Cervical radiculitis 01/25/2015   Chronic headaches 09/28/2011   Closed avulsion fracture of left ankle 02/24/2020   COVID-19 08/11/2020   Depression    Diabetes mellitus    type II   Fatigue 12/16/2008   Qualifier: Diagnosis of  By: Wynona Luna    GERD (gastroesophageal reflux disease)    Headache(784.0)    Hyperlipidemia 08/11/2020   Hypogonadism male    Neck pain 02/27/2013   Obstructive sleep apnea 06/09/2016   Unattended Home Sleep Test 07/05/16-AHI 14.7/hour, desaturation to 79%, body weight 189 pounds.  AHI around 2.5 usually 04/23/18    Pain in right knee 08/07/2018   Presence of insulin pump (external) (internal) 06/16/2021   Seasonal and perennial allergic rhinitis 09/28/2011   Sleep apnea    CPAP   Type 1 diabetes  mellitus, uncontrolled 10/02/2015    Patient Active Problem List   Diagnosis Date Noted   Post-viral cough syndrome 01/20/2022   Chronic throat clearing 07/20/2021   Hypogonadism male 07/19/2021   Presence of insulin pump (external) (internal) 06/16/2021   Biceps tendinitis, left, distal 11/17/2020   Hyperlipidemia 08/11/2020   COVID-19 08/11/2020   Closed avulsion fracture of left ankle 02/24/2020   GERD (gastroesophageal reflux disease) 11/21/2019   Well adult exam 04/30/2019   Pain in right knee 08/07/2018   Obstructive sleep apnea 06/09/2016   Uncontrolled type 1 diabetes mellitus with hyperglycemia ([place]) 10/02/2015   Cervical radiculitis 01/25/2015   Neck pain 02/27/2013   MDD (major depressive disorder) 12/19/2012   Chronic headaches 09/28/2011   Seasonal and perennial allergic rhinitis 09/28/2011   Fatigue 12/16/2008    Past Surgical History:  Procedure Laterality Date   NASAL SINUS SURGERY  04/1991   TONSILLECTOMY  1972       Home Medications    Prior to Admission medications   Medication Sig Start Date End Date Taking? Authorizing Provider  benzonatate (TESSALON) 200 MG capsule Take 1 capsule (200 mg total) by mouth 2 (two) times daily as needed for cough. 01/20/22   Luetta Nutting, DO  budesonide-formoterol [facility]) 160-4.5 MCG/ACT inhaler  Inhale 2 puffs into the lungs 2 (two) times daily. 01/20/22   Everrett Coombe, DO  Continuous Blood Gluc Sensor (FREESTYLE LIBRE 3 SENSOR) MISC Apply on to the skin every 14 days. 02/16/22   Carlus Pavlov, MD  Continuous Blood Gluc Sensor (FREESTYLE LIBRE 3 SENSOR) MISC Apply 1 sensor every 14 days as directed 04/21/22     COVID-19 At Home Antigen Test [facility] COVID-19 HOME TEST) KIT Use as directed per package instructions 07/15/21   Judyann Munson, MD  esomeprazole (NEXIUM) 40 MG capsule Take 1 capsule (40 mg total) by mouth daily at dinner time. 01/26/22   Everrett Coombe, DO  famotidine (PEPCID) 20 MG tablet Take 1  tablet (20 mg total) by mouth 2 (two) times daily. 11/29/21   Breeback, Jade L, PA-C  fexofenadine (ALLEGRA) 180 MG tablet Take 1 tablet (180 mg total) by mouth daily. 12/20/21   Everrett Coombe, DO  insulin glargine (LANTUS SOLOSTAR) 100 UNIT/ML Solostar Pen Inject 40 Units into the skin at bedtime. 02/03/22   Carlus Pavlov, MD  insulin lispro (HUMALOG KWIKPEN) 200 UNIT/ML KwikPen Inject under the skin up to 50 units of insulin daily as advised 02/03/22   Carlus Pavlov, MD  insulin lispro (HUMALOG) 100 UNIT/ML injection Inject 0.8 mLs (80 Units total) into the skin via pump daily. 04/01/22     insulin lispro (HUMALOG) 100 UNIT/ML injection Inject 1 mL (100 Units total) into the skin daily. 04/05/22     Insulin Lispro-aabc (LYUMJEV) 100 UNIT/ML SOLN Use up to 100 units a day in the insulin pump. 06/04/21   Carlus Pavlov, MD  metFORMIN (GLUCOPHAGE-XR) 500 MG 24 hr tablet Take 4 tablets (2,000 mg total) by mouth daily with breakfast. 11/09/21   Carlus Pavlov, MD  montelukast (SINGULAIR) 10 MG tablet Take 10 mg by mouth at bedtime.    [provider]  rosuvastatin (CRESTOR) 10 MG tablet Take 1 tablet (10 mg total) by mouth daily. 04/01/22     sucralfate (CARAFATE) 1 g tablet Take 1 tablet (1 g total) by mouth 4 (four) times daily as needed. 01/17/22       Family History Family History  Adopted: Yes  Problem Relation Age of Onset   Cancer Father 37       Prostate cancer   Diabetes Mellitus I Son     Social History Social History   Tobacco Use   Smoking status: Never    Passive exposure: Never   Smokeless tobacco: Never  Vaping Use   Vaping Use: Never used  Substance Use Topics   Alcohol use: No   Drug use: No     Allergies   Cymbalta [duloxetine hcl], Influenza vaccines, Penicillins, Tobacco, Ace inhibitors, Iodinated contrast media, Pioglitazone, Promethazine hcl, and Reglan [metoclopramide]   Review of Systems Review of Systems  Musculoskeletal:  Negative for  gait problem, joint swelling and stiffness.       Left knee pain  Skin:  Negative for color change.  All other systems reviewed and are negative.    Physical Exam Triage Vital Signs ED Triage Vitals  Enc Vitals Group     BP 04/25/22 1926 129/71     Pulse Rate 04/25/22 1926 75     Resp 04/25/22 1926 17     Temp 04/25/22 1926 98.4 F (36.9 C)     Temp Source 04/25/22 1926 Oral     SpO2 04/25/22 1926 97 %     Weight --      Height --  Head Circumference --      Peak Flow --      Pain Score 04/25/22 1927 0     Pain Loc --      Pain Edu? --      Excl. in [place]? --    No data found.  Updated Vital Signs BP 129/71 (BP Location: Right Arm)   Pulse 75   Temp 98.4 F (36.9 C) (Oral)   Resp 17   SpO2 97%   Visual Acuity Right Eye Distance:   Left Eye Distance:   Bilateral Distance:    Right Eye Near:   Left Eye Near:    Bilateral Near:     Physical Exam Vitals reviewed.  Constitutional:      General: He is not in acute distress. HENT:     Head: Normocephalic.  Eyes:     Pupils: Pupils are equal, round, and reactive to light.  Cardiovascular:     Rate and Rhythm: Normal rate.  Pulmonary:     Effort: Pulmonary effort is normal.  Musculoskeletal:     Left knee: No swelling, effusion, erythema, ecchymosis or crepitus. Normal range of motion. Tenderness present. No LCL laxity, MCL laxity, ACL laxity or PCL laxity.Normal meniscus and normal patellar mobility.     Instability Tests: Anterior drawer test negative. Posterior drawer test negative. Medial McMurray test negative and lateral McMurray test negative.     Right lower leg: No edema.     Left lower leg: No edema.       Legs:     Comments: Patient points to an area of tenderness below and slightly medial to the left patella.  Skin:    General: Skin is warm and dry.  Neurological:     Mental Status: He is alert.      UC Treatments / Results  Labs (all labs ordered are listed, but only abnormal results  are displayed) Labs Reviewed - No data to display  EKG   Radiology DG Knee Complete 4 Views Left  Result Date: 04/25/2022 CLINICAL DATA:  Knee pain over the last 4 days EXAM: LEFT KNEE - COMPLETE 4+ VIEW COMPARISON:  07/02/2018 FINDINGS: No evidence of fracture, dislocation, or joint effusion. No evidence of arthropathy or other focal bone abnormality. Soft tissues are unremarkable. IMPRESSION: Normal radiographs. Electronically Signed   By: Nelson Chimes M.D.   On: 04/25/2022 20:13    Procedures Procedures (including critical care time)  Medications Ordered in UC Medications - No data to display  Initial Impression / Assessment and Plan / UC Course  I have reviewed the triage vital signs and the nursing notes.  Pertinent labs & imaging results that were available during my care of the patient were reviewed by me and considered in my medical decision making (see chart for details).    Given treatment instructions with range of motion and stretching exercises. Followup with Dr. Aundria Mems ([facility]) if not improving about two weeks.   Final Clinical Impressions(s) / UC Diagnoses   Final diagnoses:  Pes anserine bursitis     Discharge Instructions      Take Ibuprofen 200mg , 4 tabs every 8 hours with food for 4 to 5 days.  Alternatively may apply Voltaren gel (diclofenac) to the painful area 3 to 4 times daily. Apply ice pack for 20 to 30 minutes, 3 to 4 times daily  Continue until pain decreases.  Begin range of motion and stretching exercises as tolerated.    ED Prescriptions  None       Kandra Nicolas, MD 04/26/22 1536

## 2022-04-25 NOTE — ED Triage Notes (Signed)
Pt c/o LT knee pain x 4 days. Denies injury. Hurts to bend or walk on. Taking motrin prn.

## 2022-04-25 NOTE — Discharge Instructions (Signed)
Take Ibuprofen 200mg , 4 tabs every 8 hours with food for 4 to 5 days.  Alternatively may apply Voltaren gel (diclofenac) to the painful area 3 to 4 times daily. Apply ice pack for 20 to 30 minutes, 3 to 4 times daily  Continue until pain decreases.  Begin range of motion and stretching exercises as tolerated.

## 2022-04-26 ENCOUNTER — Ambulatory Visit: Payer: BC Managed Care – PPO

## 2022-04-28 ENCOUNTER — Encounter: Payer: Self-pay | Admitting: Internal Medicine

## 2022-04-28 DIAGNOSIS — E1065 Type 1 diabetes mellitus with hyperglycemia: Secondary | ICD-10-CM | POA: Diagnosis not present

## 2022-04-29 DIAGNOSIS — E1065 Type 1 diabetes mellitus with hyperglycemia: Secondary | ICD-10-CM | POA: Diagnosis not present

## 2022-05-02 ENCOUNTER — Other Ambulatory Visit (HOSPITAL_BASED_OUTPATIENT_CLINIC_OR_DEPARTMENT_OTHER): Payer: Self-pay

## 2022-05-02 ENCOUNTER — Encounter: Payer: Self-pay | Admitting: Sports Medicine

## 2022-05-02 ENCOUNTER — Ambulatory Visit (INDEPENDENT_AMBULATORY_CARE_PROVIDER_SITE_OTHER): Payer: BC Managed Care – PPO

## 2022-05-02 ENCOUNTER — Ambulatory Visit: Payer: BC Managed Care – PPO | Admitting: Sports Medicine

## 2022-05-02 DIAGNOSIS — M1712 Unilateral primary osteoarthritis, left knee: Secondary | ICD-10-CM | POA: Insufficient documentation

## 2022-05-02 DIAGNOSIS — S83522A Sprain of posterior cruciate ligament of left knee, initial encounter: Secondary | ICD-10-CM

## 2022-05-02 DIAGNOSIS — M7122 Synovial cyst of popliteal space [Baker], left knee: Secondary | ICD-10-CM | POA: Diagnosis not present

## 2022-05-02 DIAGNOSIS — S8992XA Unspecified injury of left lower leg, initial encounter: Secondary | ICD-10-CM | POA: Diagnosis not present

## 2022-05-02 DIAGNOSIS — M25562 Pain in left knee: Secondary | ICD-10-CM | POA: Insufficient documentation

## 2022-05-02 MED ORDER — MELOXICAM 15 MG PO TABS
ORAL_TABLET | ORAL | 3 refills | Status: AC
  Filled 2022-05-02: qty 30, 30d supply, fill #0
  Filled 2022-06-02: qty 30, 30d supply, fill #1
  Filled 2022-09-06: qty 30, 30d supply, fill #2

## 2022-05-02 NOTE — Progress Notes (Signed)
    Procedures performed today:    None.  Independent interpretation of notes and tests performed by another provider:   None.  Brief History, Exam, Impression, and Recommendations:    Tear of PCL (posterior cruciate ligament) of knee, left, initial encounter Pleasant 57 year old male, has had a vague onset of pain left knee deep in the knee, occasional catching, popping, mechanical symptoms, moderate gelling. Was seen in urgent care, x-rays were negative, on exam he has tenderness posterior joint lines as well as a positive sag sign with hyperextension concerning for a PCL injury, his exam is significantly abnormal compared to the contralateral side. As he does have concern for intra-articular derangement such as a ligamentous tear and has nondiagnostic x-rays we will proceed with MRI, adding so meloxicam for pain in the meantime.    ____________________________________________ Gwen Her. Dianah Field, M.D., ABFM., CAQSM., AME. Primary Care and Sports Medicine [facility]  Adjunct Professor of [employer]  Risk manager

## 2022-05-02 NOTE — Assessment & Plan Note (Signed)
Pleasant 57 year old male, has had a vague onset of pain left knee deep in the knee, occasional catching, popping, mechanical symptoms, moderate gelling. Was seen in urgent care, x-rays were negative, on exam he has tenderness posterior joint lines as well as a positive sag sign with hyperextension concerning for a PCL injury, his exam is significantly abnormal compared to the contralateral side. As he does have concern for intra-articular derangement such as a ligamentous tear and has nondiagnostic x-rays we will proceed with MRI, adding so meloxicam for pain in the meantime.

## 2022-05-03 ENCOUNTER — Encounter: Payer: Self-pay | Admitting: Sports Medicine

## 2022-05-03 ENCOUNTER — Other Ambulatory Visit (HOSPITAL_BASED_OUTPATIENT_CLINIC_OR_DEPARTMENT_OTHER): Payer: Self-pay

## 2022-05-03 DIAGNOSIS — M25562 Pain in left knee: Secondary | ICD-10-CM

## 2022-05-03 MED ORDER — TRAMADOL HCL 50 MG PO TABS
50.0000 mg | ORAL_TABLET | Freq: Three times a day (TID) | ORAL | 0 refills | Status: DC | PRN
  Filled 2022-05-03: qty 21, 7d supply, fill #0

## 2022-05-03 NOTE — Assessment & Plan Note (Signed)
Bone contusions, patient needing pain medication, adding some tramadol.

## 2022-05-03 NOTE — Telephone Encounter (Signed)
I spent 5 total minutes of online digital evaluation and management services in this patient-initiated request for online care. 

## 2022-05-06 ENCOUNTER — Other Ambulatory Visit (HOSPITAL_BASED_OUTPATIENT_CLINIC_OR_DEPARTMENT_OTHER): Payer: Self-pay

## 2022-05-11 ENCOUNTER — Telehealth: Payer: BC Managed Care – PPO | Admitting: Physician Assistant

## 2022-05-11 DIAGNOSIS — F411 Generalized anxiety disorder: Secondary | ICD-10-CM

## 2022-05-11 DIAGNOSIS — F339 Major depressive disorder, recurrent, unspecified: Secondary | ICD-10-CM | POA: Diagnosis not present

## 2022-05-11 MED ORDER — FLUOXETINE HCL 20 MG PO TABS: 20.0000 mg | ORAL_TABLET | Freq: Every day | ORAL | 0 refills | Status: DC

## 2022-05-11 NOTE — Patient Instructions (Signed)
Nathaniel [name], thank you for joining Mar Daring, PA-C for today's virtual visit.  While this provider is not your primary care provider (PCP), if your PCP is located in our provider database this encounter information will be shared with them immediately following your visit.   [place] account gives you access to today's visit and all your visits, tests, and labs performed at [facility] " click here if you don't have a [facility] account or go to [email or web address]  Consent: (Patient) Nathaniel [name] provided verbal consent for this virtual visit at the beginning of the encounter.  Current Medications:  Current Outpatient Medications:    FLUoxetine (PROZAC) 20 MG tablet, Take 1 tablet (20 mg total) by mouth daily. Start with 1/2 tablet (48m) PO daily x 1 week, then increase to 1 tablet (27m) PO daily, Disp: 90 tablet, Rfl: 0   benzonatate (TESSALON) 200 MG capsule, Take 1 capsule (200 mg total) by mouth 2 (two) times daily as needed for cough., Disp: 20 capsule, Rfl: 1   budesonide-formoterol (SYMBICORT) 160-4.5 MCG/ACT inhaler, Inhale 2 puffs into the lungs 2 (two) times daily., Disp: 10.2 g, Rfl: 3   Continuous Blood Gluc Sensor (FREESTYLE LIBRE 3 SENSOR) MISC, Apply on to the skin every 14 days., Disp: 6 each, Rfl: 0   Continuous Blood Gluc Sensor (FREESTYLE LIBRE 3 SENSOR) MISC, Apply 1 sensor every 14 days as directed, Disp: 2 each, Rfl: 11   COVID-19 At Home Antigen Test ([facility] COVID-19 HOME TEST) KIT, Use as directed per package instructions, Disp: 8 each, Rfl: 0   esomeprazole (NEXIUM) 40 MG capsule, Take 1 capsule (40 mg total) by mouth daily at dinner time., Disp: 90 capsule, Rfl: 0   famotidine (PEPCID) 20 MG tablet, Take 1 tablet (20 mg total) by mouth 2 (two) times daily., Disp: 60 tablet, Rfl: 2   fexofenadine (ALLEGRA) 180 MG tablet, Take 1 tablet (180 mg total) by mouth daily., Disp: 90 tablet, Rfl: 3   insulin glargine  (LANTUS SOLOSTAR) 100 UNIT/ML Solostar Pen, Inject 40 Units into the skin at bedtime., Disp: 30 mL, Rfl: 3   insulin lispro (HUMALOG KWIKPEN) 200 UNIT/ML KwikPen, Inject under the skin up to 50 units of insulin daily as advised, Disp: 30 mL, Rfl: 3   insulin lispro (HUMALOG) 100 UNIT/ML injection, Inject 0.8 mLs (80 Units total) into the skin via pump daily., Disp: 50 mL, Rfl: 3   insulin lispro (HUMALOG) 100 UNIT/ML injection, Inject 1 mL (100 Units total) into the skin daily., Disp: 90 mL, Rfl: 1   Insulin Lispro-aabc (LYUMJEV) 100 UNIT/ML SOLN, Use up to 100 units a day in the insulin pump., Disp: 90 mL, Rfl: 3   meloxicam (MOBIC) 15 MG tablet, 1 tablet (15 mg total) daily for 14 days with meals, THEN 1 tablet (15 mg total) daily as needed., Disp: 30 tablet, Rfl: 3   metFORMIN (GLUCOPHAGE-XR) 500 MG 24 hr tablet, Take 4 tablets (2,000 mg total) by mouth daily with breakfast., Disp: 360 tablet, Rfl: 3   montelukast (SINGULAIR) 10 MG tablet, Take 10 mg by mouth at bedtime., Disp: , Rfl:    rosuvastatin (CRESTOR) 10 MG tablet, Take 1 tablet (10 mg total) by mouth daily., Disp: 90 tablet, Rfl: 1   sucralfate (CARAFATE) 1 g tablet, Take 1 tablet (1 g total) by mouth 4 (four) times daily as needed., Disp: 120 tablet, Rfl: 0   traMADol (ULTRAM) 50 MG tablet, Take 1 tablet (50  mg total) by mouth every 8 (eight) hours as needed for moderate pain., Disp: 21 tablet, Rfl: 0   Medications ordered in this encounter:  Meds ordered this encounter  Medications   FLUoxetine (PROZAC) 20 MG tablet    Sig: Take 1 tablet (20 mg total) by mouth daily. Start with 1/2 tablet (21m) PO daily x 1 week, then increase to 1 tablet (218m) PO daily    Dispense:  90 tablet    Refill:  0    Order Specific Question:   Supervising Provider    Answer:   LAChase [id number]   *If you need refills on other medications prior to your next appointment, please contact your pharmacy*  Follow-Up: Call back or seek an  in-person evaluation if the symptoms worsen or if the condition fails to improve as anticipated.  [phone] Instructions  Fluoxetine Capsules or Tablets (Depression/Mood Disorders) What is this medication? FLUOXETINE (floo OX e teen) treats depression, anxiety, obsessive-compulsive disorder (OCD), and eating disorders. It increases the amount of serotonin in the brain, a hormone that helps regulate mood. It belongs to a group of medications called SSRIs. This medicine may be used for other purposes; ask your health care provider or pharmacist if you have questions. COMMON BRAND NAME(S): Prozac What should I tell my care team before I take this medication? They need to know if you have any of these conditions: Bipolar disorder or a family history of bipolar disorder Bleeding disorders Glaucoma Heart disease Liver disease Low levels of sodium in the blood Seizures Suicidal thoughts, plans, or attempt; a previous suicide attempt by you or a family member Take MAOIs like Carbex, Eldepryl, Marplan, Nardil, and Parnate Take medications that treat or prevent blood clots Thyroid disease An unusual or allergic reaction to fluoxetine, other medications, foods, dyes, or preservatives Pregnant or trying to get pregnant Breast-feeding How should I use this medication? Take this medication by mouth with a glass of water. Follow the directions on the prescription label. You can take this medication with or without food. Take your medication at regular intervals. Do not take it more often than directed. Do not stop taking this medication suddenly except upon the advice of your care team. Stopping this medication too quickly may cause serious side effects or your condition may worsen. A special MedGuide will be given to you by the pharmacist with each prescription and refill. Be sure to read this information carefully each time. Talk to your care team about the use  of this medication in children. While it may be prescribed for children as young as 7 years for selected conditions, precautions do apply. Overdosage: If you think you have taken too much of this medicine contact a poison control center or emergency room at once. NOTE: This medicine is only for you. Do not share this medicine with others. What if I miss a dose? If you miss a dose, skip the missed dose and go back to your regular dosing schedule. Do not take double or extra doses. What may interact with this medication? Do not take this medication with any of the following: Other medications containing fluoxetine, like Sarafem or Symbyax Cisapride Dronedarone Linezolid MAOIs like Carbex, Eldepryl, Marplan, Nardil, and Parnate Methylene blue (injected into a vein) Pimozide Thioridazine This medication may also interact with the following: Alcohol Amphetamines Aspirin and aspirin-like medications Carbamazepine Certain medications for depression, anxiety, or psychotic disturbances Certain medications for migraine headaches  like almotriptan, eletriptan, frovatriptan, naratriptan, rizatriptan, sumatriptan, zolmitriptan Digoxin Diuretics Fentanyl Flecainide Furazolidone Isoniazid Lithium Medications for sleep Medications that treat or prevent blood clots like warfarin, enoxaparin, and dalteparin NSAIDs, medications for pain and inflammation, like ibuprofen or naproxen Other medications that prolong the QT interval (an abnormal heart rhythm) Phenytoin Procarbazine Propafenone Rasagiline Ritonavir Supplements like St. John's wort, kava kava, valerian Tramadol Tryptophan Vinblastine This list may not describe all possible interactions. Give your health care provider a list of all the medicines, herbs, non-prescription drugs, or dietary supplements you use. Also tell them if you smoke, drink alcohol, or use illegal drugs. Some items may interact with your medicine. What should I watch  for while using this medication? Tell your care team if your symptoms do not get better or if they get worse. Visit your care team for regular checks on your progress. Because it may take several weeks to see the full effects of this medication, it is important to continue your treatment as prescribed. Watch for new or worsening thoughts of suicide or depression. This includes sudden changes in mood, behavior, or thoughts. These changes can happen at any time but are more common in the beginning of treatment or after a change in dose. Call your care team right away if you experience these thoughts or worsening depression. Manic episodes may happen in patients with bipolar disorder who take this medication. Watch for changes in feelings or behaviors such as feeling anxious, nervous, agitated, panicky, irritable, hostile, aggressive, impulsive, severely restless, overly excited and hyperactive, or trouble sleeping. These symptoms can happen at anytime but are more common in the beginning of treatment or after a change in dose. Call your care team right away if you notice any of these symptoms. You may get drowsy or dizzy. Do not drive, use machinery, or do anything that needs mental alertness until you know how this medication affects you. Do not stand or sit up quickly, especially if you are an older patient. This reduces the risk of dizzy or fainting spells. Alcohol may interfere with the effect of this medication. Avoid alcoholic drinks. Your mouth may get dry. Chewing sugarless gum or sucking hard candy, and drinking plenty of water may help. Contact your care team if the problem does not go away or is severe. This medication may affect blood sugar levels. If you have diabetes, check with your care team before you make changes to your diet or medications. What side effects may I notice from receiving this medication? Side effects that you should report to your care team as soon as possible: Allergic  reactions--skin rash, itching, hives, swelling of the face, lips, tongue, or throat Bleeding--bloody or black, tar-like stools, red or dark brown urine, vomiting blood or brown material that looks like coffee grounds, small, red or purple spots on skin, unusual bleeding or bruising Heart rhythm changes--fast or irregular heartbeat, dizziness, feeling faint or lightheaded, chest pain, trouble breathing Loss of appetite with weight loss Low sodium level--muscle weakness, fatigue, dizziness, headache, confusion Serotonin syndrome--irritability, confusion, fast or irregular heartbeat, muscle stiffness, twitching muscles, sweating, high fever, seizure, chills, vomiting, diarrhea Sudden eye pain or change in vision such as blurry vision, seeing halos around lights, vision loss Thoughts of suicide or self-harm, worsening mood, feelings of depression Side effects that usually do not require medical attention (report to your care team if they continue or are bothersome): Anxiety, nervousness Change in sex drive or performance Diarrhea Dry mouth Headache Excessive sweating Nausea Tremors or  shaking Trouble sleeping Upset stomach This list may not describe all possible side effects. Call your doctor for medical advice about side effects. You may report side effects to FDA at 1-800-FDA-1088. Where should I keep my medication? Keep out of the reach of children and pets. Store at room temperature between 15 and 30 degrees C (59 and 86 degrees F). Get rid of any unused medication after the expiration date. NOTE: This sheet is a summary. It may not cover all possible information. If you have questions about this medicine, talk to your doctor, pharmacist, or health care provider.  2023 Elsevier/Gold Standard (2020-03-09 00:00:00)    If you have been instructed to have an in-person evaluation today at a local Urgent Care facility, please use the link below. It will take you to a list of all of our  available [facility], including address, phone number and hours of operation. Please do not delay care.  [facility]  If you or a family member do not have a primary care provider, use the link below to schedule a visit and establish care. When you choose a [facility] primary care physician or advanced practice provider, you gain a long-term partner in health. Find a Primary Care Provider  Learn more about [facility]'s in-office and virtual care options: [facility]

## 2022-05-11 NOTE — Progress Notes (Signed)
Virtual Visit Consent   [name], you are scheduled for a virtual visit with a [facility] provider today. Just as with appointments in the office, your consent must be obtained to participate. Your consent will be active for this visit and any virtual visit you may have with one of our providers in the next 365 days. If you have a MyChart account, a copy of this consent can be sent to you electronically.  As this is a virtual visit, video technology does not allow for your provider to perform a traditional examination. This may limit your provider's ability to fully assess your condition. If your provider identifies any concerns that need to be evaluated in person or the need to arrange testing (such as labs, EKG, etc.), we will make arrangements to do so. Although advances in technology are sophisticated, we cannot ensure that it will always work on either your end or our end. If the connection with a video visit is poor, the visit may have to be switched to a telephone visit. With either a video or telephone visit, we are not always able to ensure that we have a secure connection.  By engaging in this virtual visit, you consent to the provision of healthcare and authorize for your insurance to be billed (if applicable) for the services provided during this visit. Depending on your insurance coverage, you may receive a charge related to this service.  I need to obtain your verbal consent now. Are you willing to proceed with your visit today? Nathaniel [name] has provided verbal consent on 05/11/2022 for a virtual visit (video or telephone). Mar Daring, PA-C  Date: 05/11/2022 5:51 PM  Virtual Visit via Video Note   I, Mar Daring, connected with  Nathaniel [name]  ([id number], 05-Apr-1965) on 05/11/22 at  5:45 PM EST by a video-enabled telemedicine application and verified that I am speaking with the correct person using two identifiers.  Location: Patient: Virtual Visit Location  Patient: Home Provider: Virtual Visit Location Provider: Home Office   I discussed the limitations of evaluation and management by telemedicine and the availability of in person appointments. The patient expressed understanding and agreed to proceed.    History of Present Illness: Nathaniel [name] is a 57 y.o. who identifies as a male who was assigned male at birth, and is being seen today for wanting to restart Prozac. Had been on this last year and stopped. PCP would not refill without an in person evaluation. He has been having increased stress and frustration over a knee injury that has limited his physical activity. Since losing that outlet he feels he is increasingly becoming more irritable and more frequent anxiety. Reports feeling down. Denies any SI or HI.   Problems:  Patient Active Problem List   Diagnosis Date Noted   Left knee pain 05/02/2022   Post-viral cough syndrome 01/20/2022   Chronic throat clearing 07/20/2021   Hypogonadism male 07/19/2021   Presence of insulin pump (external) (internal) 06/16/2021   Biceps tendinitis, left, distal 11/17/2020   Hyperlipidemia 08/11/2020   COVID-19 08/11/2020   Closed avulsion fracture of left ankle 02/24/2020   GERD (gastroesophageal reflux disease) 11/21/2019   Well adult exam 04/30/2019   Pain in right knee 08/07/2018   Obstructive sleep apnea 06/09/2016   Uncontrolled type 1 diabetes mellitus with hyperglycemia ([place]) 10/02/2015   Cervical radiculitis 01/25/2015   Neck pain 02/27/2013   MDD (major depressive disorder) 12/19/2012   Chronic headaches 09/28/2011  Seasonal and perennial allergic rhinitis 09/28/2011   Fatigue 12/16/2008    Allergies:  Allergies  Allergen Reactions   Cymbalta [Duloxetine Hcl] Other (See Comments)    Suicidal thoughts   Influenza Vaccines Other (See Comments)    Unknown reaction   Penicillins Other (See Comments)    Unknown - childhood allergy per mother   Tobacco Swelling    Arm swelled up  with allergy testing    Ace Inhibitors Swelling    Angioedema with lisinopril   Iodinated Contrast Media Hives and Itching   Pioglitazone Other (See Comments)      headache   Promethazine Hcl Nausea And Vomiting        Reglan [Metoclopramide] Other (See Comments)    Jittery, increased anxiety   Medications:  Current Outpatient Medications:    FLUoxetine (PROZAC) 20 MG tablet, Take 1 tablet (20 mg total) by mouth daily. Start with 1/2 tablet (47m) PO daily x 1 week, then increase to 1 tablet (284m) PO daily, Disp: 90 tablet, Rfl: 0   benzonatate (TESSALON) 200 MG capsule, Take 1 capsule (200 mg total) by mouth 2 (two) times daily as needed for cough., Disp: 20 capsule, Rfl: 1   budesonide-formoterol (SYMBICORT) 160-4.5 MCG/ACT inhaler, Inhale 2 puffs into the lungs 2 (two) times daily., Disp: 10.2 g, Rfl: 3   Continuous Blood Gluc Sensor (FREESTYLE LIBRE 3 SENSOR) MISC, Apply on to the skin every 14 days., Disp: 6 each, Rfl: 0   Continuous Blood Gluc Sensor (FREESTYLE LIBRE 3 SENSOR) MISC, Apply 1 sensor every 14 days as directed, Disp: 2 each, Rfl: 11   COVID-19 At Home Antigen Test (CARESTART COVID-19 HOME TEST) KIT, Use as directed per package instructions, Disp: 8 each, Rfl: 0   esomeprazole (NEXIUM) 40 MG capsule, Take 1 capsule (40 mg total) by mouth daily at dinner time., Disp: 90 capsule, Rfl: 0   famotidine (PEPCID) 20 MG tablet, Take 1 tablet (20 mg total) by mouth 2 (two) times daily., Disp: 60 tablet, Rfl: 2   fexofenadine (ALLEGRA) 180 MG tablet, Take 1 tablet (180 mg total) by mouth daily., Disp: 90 tablet, Rfl: 3   insulin glargine (LANTUS SOLOSTAR) 100 UNIT/ML Solostar Pen, Inject 40 Units into the skin at bedtime., Disp: 30 mL, Rfl: 3   insulin lispro (HUMALOG KWIKPEN) 200 UNIT/ML KwikPen, Inject under the skin up to 50 units of insulin daily as advised, Disp: 30 mL, Rfl: 3   insulin lispro (HUMALOG) 100 UNIT/ML injection, Inject 0.8 mLs (80 Units total) into the skin via  pump daily., Disp: 50 mL, Rfl: 3   insulin lispro (HUMALOG) 100 UNIT/ML injection, Inject 1 mL (100 Units total) into the skin daily., Disp: 90 mL, Rfl: 1   Insulin Lispro-aabc (LYUMJEV) 100 UNIT/ML SOLN, Use up to 100 units a day in the insulin pump., Disp: 90 mL, Rfl: 3   meloxicam (MOBIC) 15 MG tablet, 1 tablet (15 mg total) daily for 14 days with meals, THEN 1 tablet (15 mg total) daily as needed., Disp: 30 tablet, Rfl: 3   metFORMIN (GLUCOPHAGE-XR) 500 MG 24 hr tablet, Take 4 tablets (2,000 mg total) by mouth daily with breakfast., Disp: 360 tablet, Rfl: 3   montelukast (SINGULAIR) 10 MG tablet, Take 10 mg by mouth at bedtime., Disp: , Rfl:    rosuvastatin (CRESTOR) 10 MG tablet, Take 1 tablet (10 mg total) by mouth daily., Disp: 90 tablet, Rfl: 1   sucralfate (CARAFATE) 1 g tablet, Take 1 tablet (1 g total)  by mouth 4 (four) times daily as needed., Disp: 120 tablet, Rfl: 0   traMADol (ULTRAM) 50 MG tablet, Take 1 tablet (50 mg total) by mouth every 8 (eight) hours as needed for moderate pain., Disp: 21 tablet, Rfl: 0  Observations/Objective: Patient is well-developed, well-nourished in no acute distress.  Resting comfortably at home.  Head is normocephalic, atraumatic.  No labored breathing.  Speech is clear and coherent with logical content.  Patient is alert and oriented at baseline.    Assessment and Plan: 1. Depression, recurrent ([place]) - FLUoxetine (PROZAC) 20 MG tablet; Take 1 tablet (20 mg total) by mouth daily. Start with 1/2 tablet (47m) PO daily x 1 week, then increase to 1 tablet (254m) PO daily  Dispense: 90 tablet; Refill: 0  2. GAD (generalized anxiety disorder) - FLUoxetine (PROZAC) 20 MG tablet; Take 1 tablet (20 mg total) by mouth daily. Start with 1/2 tablet (1067mPO daily x 1 week, then increase to 1 tablet (2m84mPO daily  Dispense: 90 tablet; Refill: 0  - Prozac restarted  - Discussed no further refills will be given through virtual urgent care and to make  sure to try to follow up with PCP in next 6-8 weeks, but definitely before the 3 month supply runs out  Follow Up Instructions: I discussed the assessment and treatment plan with the patient. The patient was provided an opportunity to ask questions and all were answered. The patient agreed with the plan and demonstrated an understanding of the instructions.  A copy of instructions were sent to the patient via MyChart unless otherwise noted below.    The patient was advised to call back or seek an in-person evaluation if the symptoms worsen or if the condition fails to improve as anticipated.  Time:  I spent 15 minutes with the patient via telehealth technology discussing the above problems/concerns.    JennMar Daring-C

## 2022-05-12 ENCOUNTER — Ambulatory Visit: Payer: BC Managed Care – PPO | Admitting: Family Medicine

## 2022-05-12 DIAGNOSIS — G4733 Obstructive sleep apnea (adult) (pediatric): Secondary | ICD-10-CM | POA: Diagnosis not present

## 2022-05-18 ENCOUNTER — Encounter: Payer: Self-pay | Admitting: Sports Medicine

## 2022-05-19 ENCOUNTER — Other Ambulatory Visit (HOSPITAL_BASED_OUTPATIENT_CLINIC_OR_DEPARTMENT_OTHER): Payer: Self-pay

## 2022-05-20 ENCOUNTER — Ambulatory Visit: Payer: Managed Care, Other (non HMO) | Admitting: Internal Medicine

## 2022-05-24 ENCOUNTER — Ambulatory Visit: Payer: BC Managed Care – PPO | Admitting: Family Medicine

## 2022-05-30 ENCOUNTER — Other Ambulatory Visit: Payer: Self-pay

## 2022-05-30 ENCOUNTER — Other Ambulatory Visit (HOSPITAL_BASED_OUTPATIENT_CLINIC_OR_DEPARTMENT_OTHER): Payer: Self-pay

## 2022-06-02 ENCOUNTER — Other Ambulatory Visit (HOSPITAL_BASED_OUTPATIENT_CLINIC_OR_DEPARTMENT_OTHER): Payer: Self-pay

## 2022-06-02 ENCOUNTER — Ambulatory Visit: Payer: BC Managed Care – PPO | Admitting: Sports Medicine

## 2022-06-09 ENCOUNTER — Other Ambulatory Visit: Payer: Self-pay

## 2022-06-16 ENCOUNTER — Other Ambulatory Visit (HOSPITAL_BASED_OUTPATIENT_CLINIC_OR_DEPARTMENT_OTHER): Payer: Self-pay

## 2022-06-16 ENCOUNTER — Ambulatory Visit: Payer: BC Managed Care – PPO | Admitting: Family Medicine

## 2022-06-16 ENCOUNTER — Encounter: Payer: Self-pay | Admitting: Family Medicine

## 2022-06-16 VITALS — BP 108/68 | HR 73 | Ht 73.0 in | Wt 231.0 lb

## 2022-06-16 DIAGNOSIS — R0989 Other specified symptoms and signs involving the circulatory and respiratory systems: Secondary | ICD-10-CM | POA: Diagnosis not present

## 2022-06-16 DIAGNOSIS — F411 Generalized anxiety disorder: Secondary | ICD-10-CM

## 2022-06-16 DIAGNOSIS — F339 Major depressive disorder, recurrent, unspecified: Secondary | ICD-10-CM | POA: Diagnosis not present

## 2022-06-16 DIAGNOSIS — F320 Major depressive disorder, single episode, mild: Secondary | ICD-10-CM

## 2022-06-16 MED ORDER — IPRATROPIUM BROMIDE 0.03 % NA SOLN
2.0000 | Freq: Two times a day (BID) | NASAL | 12 refills | Status: DC
  Filled 2022-06-16: qty 30, 30d supply, fill #0

## 2022-06-16 MED ORDER — FLUOXETINE HCL 20 MG PO TABS
20.0000 mg | ORAL_TABLET | Freq: Every day | ORAL | 1 refills | Status: DC
  Filled 2022-06-16: qty 90, 90d supply, fill #0

## 2022-06-16 NOTE — Progress Notes (Signed)
Nathaniel [name] - 57 y.o. male MRN [id number]  Date of birth: 08/08/1965  Subjective Chief Complaint  Patient presents with   Throat   Restart Prozac    HPI Nathaniel [name] is a 57 y.o. male here today for follow up .   He was recently restarted on fluoxetine.  Reports that he is doing well with this at current strength.  Still some mild depressive symptoms but does not feel like he needs adjust the dose.  No side effects at this time.  He does continue to have sensation of needing to clear his throat.  He has seen ENT as well as GI.  Endoscopy performed without any cause for his symptoms.  Initially he gets postnasal drainage which may be contributing and his symptoms are worse first thing in the morning.  ROS:  A comprehensive ROS was completed and negative except as noted per HPI . Allergies  Allergen Reactions   Cymbalta [Duloxetine Hcl] Other (See Comments)    Suicidal thoughts   Influenza Vaccines Other (See Comments)    Unknown reaction   Penicillins Other (See Comments)    Unknown - childhood allergy per mother   Tobacco Swelling    Arm swelled up with allergy testing    Ace Inhibitors Swelling    Angioedema with lisinopril   Iodinated Contrast Media Hives and Itching   Pioglitazone Other (See Comments)      headache   Promethazine Hcl Nausea And Vomiting        Reglan [Metoclopramide] Other (See Comments)    Jittery, increased anxiety    Past Medical History:  Diagnosis Date   Biceps tendinitis, left, distal 11/17/2020   Cervical radiculitis 01/25/2015   Chronic headaches 09/28/2011   Closed avulsion fracture of left ankle 02/24/2020   COVID-19 08/11/2020   Depression    Diabetes mellitus    type II   Fatigue 12/16/2008   Qualifier: Diagnosis of  By: Wynona Luna    GERD (gastroesophageal reflux disease)    Headache(784.0)    Hyperlipidemia 08/11/2020   Hypogonadism male    Neck pain 02/27/2013   Obstructive sleep apnea 06/09/2016   Unattended  Home Sleep Test 07/05/16-AHI 14.7/hour, desaturation to 79%, body weight 189 pounds.  AHI around 2.5 usually 04/23/18    Pain in right knee 08/07/2018   Presence of insulin pump (external) (internal) 06/16/2021   Seasonal and perennial allergic rhinitis 09/28/2011   Sleep apnea    CPAP   Type 1 diabetes mellitus, uncontrolled 10/02/2015    Past Surgical History:  Procedure Laterality Date   NASAL SINUS SURGERY  04/1991   TONSILLECTOMY  1972    Social History   Socioeconomic History   Marital status: Married    Spouse name: Oncologist   Number of children: 2   Years of education: Not on file   Highest education level: Master's degree (e.g., MA, MS, MEng, MEd, MSW, MBA)  Occupational History    Employer: [employer]    Comment: Mental Health Case Management  Tobacco Use   Smoking status: Never    Passive exposure: Never   Smokeless tobacco: Never  Vaping Use   Vaping Use: Never used  Substance and Sexual Activity   Alcohol use: No   Drug use: No   Sexual activity: Not on file  Other Topics Concern   Not on file  Social History Narrative   He lives with wife and two children.   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   Highest level of education: master degree   Caffeine- diet soda 2 L a day   Social Determinants of Health   Financial Resource Strain: Low Risk  (06/15/2022)   Overall Financial Resource Strain (CARDIA)    Difficulty of Paying Living Expenses: Not hard at all  Food Insecurity: No Food Insecurity (06/15/2022)   Hunger Vital Sign    Worried About Running Out of Food in the Last Year: Never true    Ran Out of Food in the Last Year: Never true  Transportation Needs: No Transportation Needs (06/15/2022)   PRAPARE - Hydrologist (Medical): No    Lack of Transportation (Non-Medical): No  Physical Activity: Sufficiently Active (06/15/2022)   Exercise Vital Sign    Days of Exercise per Week: 4  days    Minutes of Exercise per Session: 40 min  Stress: Stress Concern Present (06/15/2022)   [place]    Feeling of Stress : To some extent  Social Connections: Moderately Integrated (06/15/2022)   Social Connection and Isolation Panel [NHANES]    Frequency of Communication with Friends and Family: Never    Frequency of Social Gatherings with Friends and Family: Once a week    Attends Religious Services: More than 4 times per year    Active Member of Clubs or Organizations: Yes    Attends Music therapist: More than 4 times per year    Marital Status: Married    Family History  Adopted: Yes  Problem Relation Age of Onset   Cancer Father 39       Prostate cancer   Diabetes Mellitus I Son     Health Maintenance  Topic Date Due   DTaP/Tdap/Td (2 - Td or Tdap) 03/01/2021   OPHTHALMOLOGY EXAM  03/29/2022   Diabetic kidney evaluation - eGFR measurement  06/02/2022   INFLUENZA VACCINE  06/19/2022 (Originally 10/19/2021)   Hepatitis C Screening  07/21/2022 (Originally 09/03/1983)   HIV Screening  07/21/2022 (Originally 09/02/1980)   Diabetic kidney evaluation - Urine ACR  12/17/2022 (Originally 01/05/2022)   COVID-19 Vaccine (4 - 2023-24 season) 12/17/2022 (Originally 11/19/2021)   Zoster Vaccines- Shingrix (1 of 2) 12/17/2022 (Originally 09/02/1984)   HEMOGLOBIN A1C  07/15/2022   Fecal DNA (Cologuard)  05/21/2023   FOOT EXAM  06/16/2023   HPV VACCINES  Aged Out     ----------------------------------------------------------------------------------------------------------------------------------------------------------------------------------------------------------------- Physical Exam BP 108/68 (BP Location: Left Arm, Patient Position: Sitting, Cuff Size: Large)   Pulse 73   Ht 6\' 1"  (1.854 m)   Wt 231 lb (104.8 kg)   SpO2 95%   BMI 30.48 kg/m   Physical Exam Constitutional:      Appearance: Normal  appearance.  HENT:     Head: Normocephalic and atraumatic.  Eyes:     General: No scleral icterus. Neurological:     Mental Status: He is alert.  Psychiatric:        Mood and Affect: Mood normal.        Behavior: Behavior normal.     ------------------------------------------------------------------------------------------------------------------------------------------------------------------------------------------------------------------- Assessment and Plan  Chronic throat clearing Unclear etiology.  Poss related to postnasal drainage.  Has had evaluation by ENT and GI without any findings.  Adding trial of Atrovent nasal spray.  MDD (major depressive disorder) Continue fluoxetine.  Prescription renewed.   Meds ordered this encounter  Medications   FLUoxetine (PROZAC) 20 MG tablet    Sig: Take 1  tablet (20 mg total) by mouth daily.    Dispense:  90 tablet    Refill:  1   ipratropium (ATROVENT) 0.03 % nasal spray    Sig: Place 2 sprays into both nostrils every 12 (twelve) hours.    Dispense:  30 mL    Refill:  12    No follow-ups on file.    This visit occurred during the SARS-CoV-2 public health emergency.  Safety protocols were in place, including screening questions prior to the visit, additional usage of staff PPE, and extensive cleaning of exam room while observing appropriate contact time as indicated for disinfecting solutions.

## 2022-06-16 NOTE — Assessment & Plan Note (Signed)
Continue fluoxetine.  Prescription renewed.

## 2022-06-16 NOTE — Assessment & Plan Note (Signed)
Unclear etiology.  Poss related to postnasal drainage.  Has had evaluation by ENT and GI without any findings.  Adding trial of Atrovent nasal spray.

## 2022-06-20 ENCOUNTER — Other Ambulatory Visit (HOSPITAL_BASED_OUTPATIENT_CLINIC_OR_DEPARTMENT_OTHER): Payer: Self-pay

## 2022-06-22 ENCOUNTER — Encounter: Payer: Self-pay | Admitting: Family Medicine

## 2022-06-22 DIAGNOSIS — E1065 Type 1 diabetes mellitus with hyperglycemia: Secondary | ICD-10-CM

## 2022-06-22 DIAGNOSIS — E782 Mixed hyperlipidemia: Secondary | ICD-10-CM

## 2022-06-24 DIAGNOSIS — E1065 Type 1 diabetes mellitus with hyperglycemia: Secondary | ICD-10-CM | POA: Diagnosis not present

## 2022-06-24 DIAGNOSIS — E782 Mixed hyperlipidemia: Secondary | ICD-10-CM | POA: Diagnosis not present

## 2022-06-25 LAB — CMP14+EGFR
ALT: 28 IU/L (ref 0–44)
AST: 24 IU/L (ref 0–40)
Albumin/Globulin Ratio: 1.8 (ref 1.2–2.2)
Albumin: 4.4 g/dL (ref 3.8–4.9)
Alkaline Phosphatase: 86 IU/L (ref 44–121)
BUN/Creatinine Ratio: 13 (ref 9–20)
BUN: 15 mg/dL (ref 6–24)
Bilirubin Total: 0.7 mg/dL (ref 0.0–1.2)
CO2: 24 mmol/L (ref 20–29)
Calcium: 9.6 mg/dL (ref 8.7–10.2)
Chloride: 100 mmol/L (ref 96–106)
Creatinine, Ser: 1.18 mg/dL (ref 0.76–1.27)
Globulin, Total: 2.5 g/dL (ref 1.5–4.5)
Glucose: 125 mg/dL — ABNORMAL HIGH (ref 70–99)
Potassium: 4.3 mmol/L (ref 3.5–5.2)
Sodium: 138 mmol/L (ref 134–144)
Total Protein: 6.9 g/dL (ref 6.0–8.5)
eGFR: 72 mL/min/{1.73_m2} (ref >59)

## 2022-06-25 LAB — LIPID PANEL
Chol/HDL Ratio: 2.2 ratio (ref 0.0–5.0)
Cholesterol, Total: 117 mg/dL (ref 100–199)
HDL: 53 mg/dL (ref >39)
LDL Chol Calc (NIH): 48 mg/dL (ref 0–99)
Triglycerides: 84 mg/dL (ref 0–149)
VLDL Cholesterol Cal: 16 mg/dL (ref 5–40)

## 2022-06-25 LAB — CBC WITH DIFFERENTIAL/PLATELET
Basophils Absolute: 0 10*3/uL (ref 0.0–0.2)
Basos: 1 %
EOS (ABSOLUTE): 0.1 10*3/uL (ref 0.0–0.4)
Eos: 2 %
Hematocrit: 40.8 % (ref 37.5–51.0)
Hemoglobin: 13.3 g/dL (ref 13.0–17.7)
Immature Grans (Abs): 0.1 10*3/uL (ref 0.0–0.1)
Immature Granulocytes: 1 %
Lymphocytes Absolute: 1.7 10*3/uL (ref 0.7–3.1)
Lymphs: 27 %
MCH: 28.2 pg (ref 26.6–33.0)
MCHC: 32.6 g/dL (ref 31.5–35.7)
MCV: 87 fL (ref 79–97)
Monocytes Absolute: 0.7 10*3/uL (ref 0.1–0.9)
Monocytes: 10 %
Neutrophils Absolute: 3.8 10*3/uL (ref 1.4–7.0)
Neutrophils: 59 %
Platelets: 240 10*3/uL (ref 150–450)
RBC: 4.71 x10E6/uL (ref 4.14–5.80)
RDW: 13.3 % (ref 11.6–15.4)
WBC: 6.4 10*3/uL (ref 3.4–10.8)

## 2022-06-25 LAB — HEMOGLOBIN A1C
Est. average glucose Bld gHb Est-mCnc: 209 mg/dL
Hgb A1c MFr Bld: 8.9 % — ABNORMAL HIGH (ref 4.8–5.6)

## 2022-07-04 ENCOUNTER — Encounter: Payer: Self-pay | Admitting: *Deleted

## 2022-07-06 ENCOUNTER — Encounter: Payer: BC Managed Care – PPO | Admitting: Family Medicine

## 2022-07-18 ENCOUNTER — Encounter: Payer: Self-pay | Admitting: Family Medicine

## 2022-07-19 ENCOUNTER — Ambulatory Visit: Payer: BC Managed Care – PPO | Admitting: Sports Medicine

## 2022-07-19 ENCOUNTER — Ambulatory Visit (INDEPENDENT_AMBULATORY_CARE_PROVIDER_SITE_OTHER): Payer: BC Managed Care – PPO

## 2022-07-19 ENCOUNTER — Other Ambulatory Visit (HOSPITAL_BASED_OUTPATIENT_CLINIC_OR_DEPARTMENT_OTHER): Payer: Self-pay

## 2022-07-19 DIAGNOSIS — M542 Cervicalgia: Secondary | ICD-10-CM

## 2022-07-19 DIAGNOSIS — M5412 Radiculopathy, cervical region: Secondary | ICD-10-CM | POA: Diagnosis not present

## 2022-07-19 MED ORDER — PREDNISONE 50 MG PO TABS
50.0000 mg | ORAL_TABLET | Freq: Every day | ORAL | 0 refills | Status: DC
  Filled 2022-07-19: qty 5, 5d supply, fill #0

## 2022-07-19 NOTE — Assessment & Plan Note (Signed)
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  He is having recurrence of pain, axial neck with radiation to the right upper shoulder but not quite to the thumb yet. Exam is normal. Adding 5 days of prednisone, x-rays, home physical therapy, return to see me in 6 weeks, MRI for epidural planning if not better.

## 2022-07-19 NOTE — Progress Notes (Signed)
    Procedures performed today:    None.  Independent interpretation of notes and tests performed by another provider:   None.  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  He is having recurrence of pain, axial neck with radiation to the right upper shoulder but not quite to the thumb yet. Exam is normal. Adding 5 days of prednisone, x-rays, home physical therapy, return to see me in 6 weeks, MRI for epidural planning if not better.    ____________________________________________ Ihor Austin. Benjamin Stain, M.D., ABFM., CAQSM., AME. Primary Care and Sports Medicine [place] [facility]  Adjunct Professor of Family Medicine  [place] [employer] [facility] [employer]  Restaurant manager, fast food

## 2022-07-21 ENCOUNTER — Other Ambulatory Visit: Payer: Self-pay

## 2022-07-21 ENCOUNTER — Other Ambulatory Visit: Payer: Self-pay | Admitting: Family Medicine

## 2022-07-21 ENCOUNTER — Other Ambulatory Visit (HOSPITAL_BASED_OUTPATIENT_CLINIC_OR_DEPARTMENT_OTHER): Payer: Self-pay

## 2022-07-21 DIAGNOSIS — E1065 Type 1 diabetes mellitus with hyperglycemia: Secondary | ICD-10-CM | POA: Diagnosis not present

## 2022-07-21 DIAGNOSIS — R0989 Other specified symptoms and signs involving the circulatory and respiratory systems: Secondary | ICD-10-CM

## 2022-07-21 DIAGNOSIS — E785 Hyperlipidemia, unspecified: Secondary | ICD-10-CM | POA: Diagnosis not present

## 2022-07-21 LAB — BASIC METABOLIC PANEL
BUN: 15 (ref 4–21)
CO2: 22 (ref 13–22)
Chloride: 97 — AB (ref 99–108)
Creatinine: 1.1 (ref 0.6–1.3)
Glucose: 232
Potassium: 4.7 meq/L (ref 3.5–5.1)
Sodium: 136 — AB (ref 137–147)

## 2022-07-21 LAB — HEPATIC FUNCTION PANEL
ALT: 32 U/L (ref 10–40)
AST: 27 (ref 14–40)
Alkaline Phosphatase: 85 (ref 25–125)
Bilirubin, Total: 0.6

## 2022-07-21 LAB — HEMOGLOBIN A1C: Hemoglobin A1C: 8.8

## 2022-07-21 LAB — COMPREHENSIVE METABOLIC PANEL
Albumin: 4.3 (ref 3.5–5.0)
Calcium: 9.1 (ref 8.7–10.7)
Globulin: 2.3
eGFR: 76

## 2022-07-21 LAB — LIPID PANEL
Cholesterol: 143 (ref 0–200)
HDL: 58 (ref 35–70)
LDL Cholesterol: 20
Triglycerides: 114 (ref 40–160)

## 2022-07-22 ENCOUNTER — Other Ambulatory Visit (HOSPITAL_BASED_OUTPATIENT_CLINIC_OR_DEPARTMENT_OTHER): Payer: Self-pay

## 2022-07-22 ENCOUNTER — Encounter: Payer: Self-pay | Admitting: Sports Medicine

## 2022-07-22 ENCOUNTER — Other Ambulatory Visit: Payer: Self-pay

## 2022-07-24 ENCOUNTER — Encounter: Payer: Self-pay | Admitting: Internal Medicine

## 2022-07-25 ENCOUNTER — Encounter: Payer: Self-pay | Admitting: Family Medicine

## 2022-07-25 ENCOUNTER — Other Ambulatory Visit (HOSPITAL_BASED_OUTPATIENT_CLINIC_OR_DEPARTMENT_OTHER): Payer: Self-pay

## 2022-07-25 DIAGNOSIS — E1065 Type 1 diabetes mellitus with hyperglycemia: Secondary | ICD-10-CM | POA: Diagnosis not present

## 2022-07-25 DIAGNOSIS — R0989 Other specified symptoms and signs involving the circulatory and respiratory systems: Secondary | ICD-10-CM

## 2022-07-25 DIAGNOSIS — E785 Hyperlipidemia, unspecified: Secondary | ICD-10-CM | POA: Diagnosis not present

## 2022-07-25 DIAGNOSIS — Z9641 Presence of insulin pump (external) (internal): Secondary | ICD-10-CM | POA: Diagnosis not present

## 2022-07-25 MED ORDER — ESOMEPRAZOLE MAGNESIUM 40 MG PO CPDR
40.0000 mg | DELAYED_RELEASE_CAPSULE | Freq: Every day | ORAL | 0 refills | Status: DC
  Filled 2022-07-25: qty 90, 90d supply, fill #0

## 2022-07-26 NOTE — Telephone Encounter (Signed)
Completed.    Thanks

## 2022-07-28 DIAGNOSIS — G4733 Obstructive sleep apnea (adult) (pediatric): Secondary | ICD-10-CM | POA: Diagnosis not present

## 2022-08-02 ENCOUNTER — Other Ambulatory Visit (HOSPITAL_BASED_OUTPATIENT_CLINIC_OR_DEPARTMENT_OTHER): Payer: Self-pay

## 2022-08-04 ENCOUNTER — Encounter: Payer: BC Managed Care – PPO | Admitting: Family Medicine

## 2022-08-08 ENCOUNTER — Encounter: Payer: Self-pay | Admitting: Sports Medicine

## 2022-08-08 ENCOUNTER — Ambulatory Visit: Payer: BC Managed Care – PPO | Admitting: Sports Medicine

## 2022-08-08 ENCOUNTER — Other Ambulatory Visit (HOSPITAL_COMMUNITY): Payer: Self-pay

## 2022-08-08 ENCOUNTER — Other Ambulatory Visit (HOSPITAL_BASED_OUTPATIENT_CLINIC_OR_DEPARTMENT_OTHER): Payer: Self-pay

## 2022-08-08 DIAGNOSIS — M1712 Unilateral primary osteoarthritis, left knee: Secondary | ICD-10-CM | POA: Diagnosis not present

## 2022-08-08 DIAGNOSIS — M5412 Radiculopathy, cervical region: Secondary | ICD-10-CM

## 2022-08-08 MED ORDER — PREGABALIN 75 MG PO CAPS
ORAL_CAPSULE | ORAL | 3 refills | Status: DC
Start: 2022-08-08 — End: 2022-08-09
  Filled 2022-08-08 (×2): qty 90, 30d supply, fill #0

## 2022-08-08 NOTE — Assessment & Plan Note (Signed)
Bilateral C8 radicular symptoms left worse than right, C5-C6 DDD on x-rays, he has persistent pain so we will add Lyrica, will hold off on MRI until we given the due diligence of an up titration of Lyrica.

## 2022-08-08 NOTE — Progress Notes (Signed)
    Procedures performed today:    None.  Independent interpretation of notes and tests performed by another provider:   None.  Brief History, Exam, Impression, and Recommendations:    Primary osteoarthritis of left knee Pleasant 57 year old male, persistent pain left knee medial joint line, tramadol ineffective, avoiding steroids due to blood sugar elevation. He does hurt at the joint line rather than the tibial plateau. Orthovisc is his next step however he is going to be getting new insurance over the next couple of weeks so I think it is probably not a good idea to get him approved for Orthovisc on this insurance to only have to do it again for his new insurance. I will give him some information and he can call me when he gets the new insurance plan.   Cervical radiculitis Bilateral C8 radicular symptoms left worse than right, C5-C6 DDD on x-rays, he has persistent pain so we will add Lyrica, will hold off on MRI until we given the due diligence of an up titration of Lyrica.    ____________________________________________ Ihor Austin. Benjamin Stain, M.D., ABFM., CAQSM., AME. Primary Care and Sports Medicine  [facility]  Adjunct Professor of Family Medicine  [place] [employer] [facility] [employer]  Restaurant manager, fast food

## 2022-08-08 NOTE — Assessment & Plan Note (Signed)
Pleasant 57 year old male, persistent pain left knee medial joint line, tramadol ineffective, avoiding steroids due to blood sugar elevation. He does hurt at the joint line rather than the tibial plateau. Orthovisc is his next step however he is going to be getting new insurance over the next couple of weeks so I think it is probably not a good idea to get him approved for Orthovisc on this insurance to only have to do it again for his new insurance. I will give him some information and he can call me when he gets the new insurance plan.

## 2022-08-09 ENCOUNTER — Other Ambulatory Visit (HOSPITAL_BASED_OUTPATIENT_CLINIC_OR_DEPARTMENT_OTHER): Payer: Self-pay

## 2022-08-09 DIAGNOSIS — K219 Gastro-esophageal reflux disease without esophagitis: Secondary | ICD-10-CM | POA: Diagnosis not present

## 2022-08-09 DIAGNOSIS — J309 Allergic rhinitis, unspecified: Secondary | ICD-10-CM | POA: Diagnosis not present

## 2022-08-09 DIAGNOSIS — R0989 Other specified symptoms and signs involving the circulatory and respiratory systems: Secondary | ICD-10-CM | POA: Diagnosis not present

## 2022-08-09 MED ORDER — PREGABALIN 25 MG PO CAPS
ORAL_CAPSULE | ORAL | 3 refills | Status: DC
Start: 2022-08-09 — End: 2022-09-13
  Filled 2022-08-09: qty 60, 27d supply, fill #0

## 2022-08-10 ENCOUNTER — Encounter: Payer: Self-pay | Admitting: Sports Medicine

## 2022-08-10 DIAGNOSIS — E1065 Type 1 diabetes mellitus with hyperglycemia: Secondary | ICD-10-CM | POA: Diagnosis not present

## 2022-08-10 DIAGNOSIS — M5412 Radiculopathy, cervical region: Secondary | ICD-10-CM

## 2022-08-11 ENCOUNTER — Encounter: Payer: Self-pay | Admitting: Sports Medicine

## 2022-08-11 NOTE — Telephone Encounter (Addendum)
Oh lawd, check the office note from 07/19/2022, I placed him on a home physical therapy regimen.  It has not been 6 weeks but sometimes they will still approve the MRI.  If not, he wants it bad enough and can pay out-of-pocket if it does not get covered.  Let me know if you have any trouble with the PA and I will help.  It looks like the order is still active.

## 2022-08-12 NOTE — Telephone Encounter (Signed)
Patient scheduled.

## 2022-08-12 NOTE — Telephone Encounter (Signed)
MRI cervical spine approved, approval #[id number] valid 08/11/2022 through 09/09/2022.  Please get him scheduled.

## 2022-08-14 ENCOUNTER — Other Ambulatory Visit: Payer: BC Managed Care – PPO

## 2022-08-15 ENCOUNTER — Encounter: Payer: Self-pay | Admitting: Sports Medicine

## 2022-08-16 ENCOUNTER — Ambulatory Visit (INDEPENDENT_AMBULATORY_CARE_PROVIDER_SITE_OTHER): Payer: BC Managed Care – PPO

## 2022-08-16 ENCOUNTER — Encounter: Payer: Self-pay | Admitting: Sports Medicine

## 2022-08-16 DIAGNOSIS — M5412 Radiculopathy, cervical region: Secondary | ICD-10-CM | POA: Diagnosis not present

## 2022-08-16 DIAGNOSIS — M47812 Spondylosis without myelopathy or radiculopathy, cervical region: Secondary | ICD-10-CM | POA: Diagnosis not present

## 2022-08-16 DIAGNOSIS — M542 Cervicalgia: Secondary | ICD-10-CM | POA: Diagnosis not present

## 2022-08-16 DIAGNOSIS — M4312 Spondylolisthesis, cervical region: Secondary | ICD-10-CM | POA: Diagnosis not present

## 2022-08-16 NOTE — Telephone Encounter (Signed)
Patient scheduled for today

## 2022-08-22 ENCOUNTER — Encounter: Payer: Self-pay | Admitting: Sports Medicine

## 2022-08-22 NOTE — Telephone Encounter (Signed)
See other MyChart message. The MRI results was sent to the patient.

## 2022-08-26 ENCOUNTER — Other Ambulatory Visit (INDEPENDENT_AMBULATORY_CARE_PROVIDER_SITE_OTHER): Payer: Commercial Managed Care - PPO

## 2022-08-26 ENCOUNTER — Ambulatory Visit: Payer: Commercial Managed Care - PPO | Admitting: Sports Medicine

## 2022-08-26 DIAGNOSIS — M1712 Unilateral primary osteoarthritis, left knee: Secondary | ICD-10-CM

## 2022-08-26 DIAGNOSIS — M5412 Radiculopathy, cervical region: Secondary | ICD-10-CM | POA: Diagnosis not present

## 2022-08-26 NOTE — Progress Notes (Signed)
    Procedures performed today:    Procedure: Real-time Ultrasound Guided injection of the left knee Device: Samsung HS60  Verbal informed consent obtained.  Time-out conducted.  Noted no overlying erythema, induration, or other signs of local infection.  Skin prepped in a sterile fashion.  Local anesthesia: Topical Ethyl chloride.  With sterile technique and under real time ultrasound guidance: Trace effusion noted, 1 cc Kenalog 40, 2 cc lidocaine, 2 cc bupivacaine injected easily Completed without difficulty  Advised to call if fevers/chills, erythema, induration, drainage, or persistent bleeding.  Images permanently stored and available for review in PACS.  Impression: Technically successful ultrasound guided injection.  Independent interpretation of notes and tests performed by another provider:   None.  Brief History, Exam, Impression, and Recommendations:    Primary osteoarthritis of left knee This is a very pleasant 57 year old male, known knee osteoarthritis, tramadol ineffective, NSAIDs, Tylenol ineffective, we are avoiding steroid injection due to blood sugar elevation, due to persistent pain we did a steroid injection today, I would also like to get him approved for Orthovisc. Return to see me in 6 weeks.  Cervical radiculitis [name] returns, he has cervical DDD with bilateral C8 radicular symptoms left worse than right, we have gone back and forth regarding treatments, gabapentin, Lyrica not tolerable, tramadol not tolerable, NSAIDs and acetaminophen ineffective, he did therapy about 8 months ago, ineffective, he is agreeable to now proceed with the epidural. Return to see me 6 weeks after epidural.    ____________________________________________ Ihor Austin. Benjamin Stain, M.D., ABFM., CAQSM., AME. Primary Care and Sports Medicine [facility]  Adjunct Professor of Family Medicine  [place] [employer] [facility] [employer]  [employer]

## 2022-08-26 NOTE — Assessment & Plan Note (Signed)
Nathaniel [name] returns, he has cervical DDD with bilateral C8 radicular symptoms left worse than right, we have gone back and forth regarding treatments, gabapentin, Lyrica not tolerable, tramadol not tolerable, NSAIDs and acetaminophen ineffective, he did therapy about 8 months ago, ineffective, he is agreeable to now proceed with the epidural. Return to see me 6 weeks after epidural.

## 2022-08-26 NOTE — Assessment & Plan Note (Signed)
This is a very pleasant 57 year old male, known knee osteoarthritis, tramadol ineffective, NSAIDs, Tylenol ineffective, we are avoiding steroid injection due to blood sugar elevation, due to persistent pain we did a steroid injection today, I would also like to get him approved for Orthovisc. Return to see me in 6 weeks.

## 2022-08-28 ENCOUNTER — Encounter: Payer: Self-pay | Admitting: Sports Medicine

## 2022-08-29 ENCOUNTER — Other Ambulatory Visit (HOSPITAL_BASED_OUTPATIENT_CLINIC_OR_DEPARTMENT_OTHER): Payer: Self-pay

## 2022-08-30 ENCOUNTER — Ambulatory Visit: Payer: BC Managed Care – PPO | Admitting: Sports Medicine

## 2022-08-30 ENCOUNTER — Other Ambulatory Visit (HOSPITAL_BASED_OUTPATIENT_CLINIC_OR_DEPARTMENT_OTHER): Payer: Self-pay

## 2022-08-31 ENCOUNTER — Encounter: Payer: Self-pay | Admitting: Family Medicine

## 2022-08-31 DIAGNOSIS — M25561 Pain in right knee: Secondary | ICD-10-CM

## 2022-09-01 ENCOUNTER — Encounter: Payer: Self-pay | Admitting: Family Medicine

## 2022-09-01 NOTE — Telephone Encounter (Signed)
Referral to ortho signed.  Please add me back on as PCP. Thanks!

## 2022-09-01 NOTE — Telephone Encounter (Signed)
Left detailed voice mail message on patient home # ( allowed on DPR )  and added Dr. Ashley Royalty back as PCP.

## 2022-09-01 NOTE — Telephone Encounter (Signed)
Attempted call to patient. Left a voice mail message requesting a return call.  

## 2022-09-01 NOTE — Telephone Encounter (Signed)
Pended referral

## 2022-09-06 ENCOUNTER — Other Ambulatory Visit (HOSPITAL_BASED_OUTPATIENT_CLINIC_OR_DEPARTMENT_OTHER): Payer: Self-pay

## 2022-09-06 ENCOUNTER — Ambulatory Visit: Payer: BC Managed Care – PPO | Admitting: Sports Medicine

## 2022-09-08 ENCOUNTER — Ambulatory Visit: Payer: BC Managed Care – PPO | Admitting: Sports Medicine

## 2022-09-09 ENCOUNTER — Ambulatory Visit: Payer: Commercial Managed Care - PPO | Admitting: Orthopaedic Surgery

## 2022-09-09 ENCOUNTER — Encounter: Payer: Self-pay | Admitting: Orthopaedic Surgery

## 2022-09-09 VITALS — BP 132/82 | HR 74 | Ht 72.0 in | Wt 231.0 lb

## 2022-09-09 DIAGNOSIS — M25562 Pain in left knee: Secondary | ICD-10-CM | POA: Diagnosis not present

## 2022-09-09 DIAGNOSIS — M5412 Radiculopathy, cervical region: Secondary | ICD-10-CM

## 2022-09-09 NOTE — Progress Notes (Signed)
Office Visit Note   Patient: Nathaniel [name]           Date of Birth: Feb 03, 1966           MRN: [id number] Visit Date: 09/09/2022              Requested by: Everrett Coombe, DO [address]  [address] [place],  [place] [address] PCP: Everrett Coombe, DO   Assessment & Plan: Visit Diagnoses:  1. Cervical radiculitis   2. Left knee pain, unspecified chronicity     Plan: Discussed with patient that he should get improvement with symptoms.  There is no meniscal tear and he just has some mild cartilage wear in the medial compartment.  He does have some cervical disc protrusion and moderate foraminal stenosis not severe enough to consider intervention at this point.  We discussed he could use a cane in his right hand to help unload the left knee.  He will try to stay in chairs that are taller and have arms to decrease pressure on the posterior medial tibial plateau when he goes from sitting to standing.  He states he is comfortable once he is up and walking.  Recheck 5 weeks.  Follow-Up Instructions: Return in about 5 weeks (around 10/14/2022).   Orders:  No orders of the defined types were placed in this encounter.  No orders of the defined types were placed in this encounter.     Procedures: No procedures performed   Clinical Data: No additional findings.   Subjective: Chief Complaint  Patient presents with   Left Knee - Pain    HPI 57 year old male here for left knee pain.  He has been seeing Dr.Thekkekandam and has had an MRI of his left knee.  I previously had seen him in the past for right knee problems for years ago in 2020 and at that time he had femoral condyle marrow edema consistent with a bone bruise/nondisplaced impaction fracture which healed on its own and is doing fine.  Patient Nuys any specific injury for his left knee but has had pain particularly getting up from a chair.  MRI scan left knee shows some marrow edema posterior medial tibial plateau  without definite fracture.  He denies any history of specific trauma or knee hyperextension injury.  Some intramuscular edema medial lateral gastroc.  Review of Systems patient has some C5-6 mild disc protrusion with moderate foraminal narrowing.  History of depression headaches sleep apnea type 1 diabetes with last A1c 8.9.   Objective: Vital Signs: BP 132/82   Pulse 74   Ht 6' (1.829 m)   Wt 231 lb (104.8 kg)   BMI 31.33 kg/m   Physical Exam Constitutional:      Appearance: He is well-developed.  HENT:     Head: Normocephalic and atraumatic.     Right Ear: External ear normal.     Left Ear: External ear normal.  Eyes:     Pupils: Pupils are equal, round, and reactive to light.  Neck:     Thyroid: No thyromegaly.     Trachea: No tracheal deviation.  Cardiovascular:     Rate and Rhythm: Normal rate.  Pulmonary:     Effort: Pulmonary effort is normal.     Breath sounds: No wheezing.  Abdominal:     General: Bowel sounds are normal.     Palpations: Abdomen is soft.  Musculoskeletal:     Cervical back: Neck supple.  Skin:    General:  Skin is warm and dry.     Capillary Refill: Capillary refill takes less than 2 seconds.  Neurological:     Mental Status: He is alert and oriented to person, place, and time.  Psychiatric:        Behavior: Behavior normal.        Thought Content: Thought content normal.        Judgment: Judgment normal.     Ortho Exam mild posterior medial joint line tenderness left knee.  Pes bursa is normal hamstrings are normal some tenderness in the proximal gastroc medial as well as lateral head.  No definite palpable Baker's cyst.  Negative logroll hips.  Palpable pedal pulse.  No cellulitis.  Pes bursa is normal.  Specialty Comments:  No specialty comments available.  Imaging: arrative & Impression  CLINICAL DATA:  Meniscal injury, knee Question PCL tear   EXAM: MRI OF THE LEFT KNEE WITHOUT CONTRAST   TECHNIQUE: Multiplanar, multisequence  MR imaging of the knee was performed. No intravenous contrast was administered.   COMPARISON:  Left knee radiograph 04/25/2022   FINDINGS: MENISCI   Medial: Intact.   Lateral: Intact.   LIGAMENTS   Cruciates: ACL and PCL are intact.   Collaterals: Medial collateral ligament is intact. Lateral collateral ligament complex is intact.   CARTILAGE   Patellofemoral:  No chondral defect.   Medial:  Mild partial-thickness cartilage loss.   Lateral:  No chondral defect.   JOINT: No significant joint effusion.   POPLITEAL FOSSA: Small Baker's cyst.   EXTENSOR MECHANISM: Intact quadriceps tendon. Intact patellar tendon.   BONES: There is focal marrow edema within the posterior aspect of the medial tibial plateau without definite fracture identified. No aggressive osseous lesion. Mild marginal osteophyte formation.   Other: No focal fluid collection. There is intramuscular edema in the medial and lateral gastrocnemius above.   IMPRESSION: Focal marrow edema within the posterior aspect of the medial tibial plateau without definite fracture identified, could represent a contusion.   No evidence of meniscus tear or ligamentous injury.   Mild medial compartment osteoarthritis.   Small Baker's cyst.   Intramuscular edema in the medial and lateral gastrocnemius, could reflect low-grade muscle strain.     Electronically Signed   By: Caprice Renshaw M.D.   On: 05/02/2022 16:48      Result History    PMFS History: Patient Active Problem List   Diagnosis Date Noted   Pain in left knee 09/09/2022   Primary osteoarthritis of left knee 05/02/2022   Post-viral cough syndrome 01/20/2022   Chronic throat clearing 07/20/2021   Presence of insulin pump (external) (internal) 06/16/2021   Biceps tendinitis, left, distal 11/17/2020   Hyperlipidemia 08/11/2020   COVID-19 08/11/2020   Closed avulsion fracture of left ankle 02/24/2020   GERD (gastroesophageal reflux disease)  11/21/2019   Well adult exam 04/30/2019   Pain in right knee 08/07/2018   Obstructive sleep apnea 06/09/2016   Uncontrolled type 1 diabetes mellitus with hyperglycemia (HCC) 10/02/2015   Cervical radiculitis 01/25/2015   Neck pain 02/27/2013   MDD (major depressive disorder) 12/19/2012   Chronic headaches 09/28/2011   Seasonal and perennial allergic rhinitis 09/28/2011   Fatigue 12/16/2008   Past Medical History:  Diagnosis Date   Biceps tendinitis, left, distal 11/17/2020   Cervical radiculitis 01/25/2015   Chronic headaches 09/28/2011   Closed avulsion fracture of left ankle 02/24/2020   COVID-19 08/11/2020   Depression    Diabetes mellitus    type II  Fatigue 12/16/2008   Qualifier: Diagnosis of  By: Nena Jordan    GERD (gastroesophageal reflux disease)    Headache(784.0)    Hyperlipidemia 08/11/2020   Hypogonadism male    Neck pain 02/27/2013   Obstructive sleep apnea 06/09/2016   Unattended Home Sleep Test 07/05/16-AHI 14.7/hour, desaturation to 79%, body weight 189 pounds.  AHI around 2.5 usually 04/23/18    Pain in right knee 08/07/2018   Presence of insulin pump (external) (internal) 06/16/2021   Seasonal and perennial allergic rhinitis 09/28/2011   Sleep apnea    CPAP   Type 1 diabetes mellitus, uncontrolled 10/02/2015    Family History  Adopted: Yes  Problem Relation Age of Onset   Cancer Father 79       Prostate cancer   Diabetes Mellitus I Son     Past Surgical History:  Procedure Laterality Date   NASAL SINUS SURGERY  04/1991   TONSILLECTOMY  1972   Social History   Occupational History    Employer: [employer]    Comment: Mental Health Case Management  Tobacco Use   Smoking status: Never    Passive exposure: Never   Smokeless tobacco: Never  Vaping Use   Vaping Use: Never used  Substance and Sexual Activity   Alcohol use: No   Drug use: No   Sexual activity: Not on file

## 2022-09-11 ENCOUNTER — Encounter (INDEPENDENT_AMBULATORY_CARE_PROVIDER_SITE_OTHER): Payer: Commercial Managed Care - PPO | Admitting: Sports Medicine

## 2022-09-11 DIAGNOSIS — M5412 Radiculopathy, cervical region: Secondary | ICD-10-CM

## 2022-09-13 ENCOUNTER — Encounter: Payer: Self-pay | Admitting: Orthopaedic Surgery

## 2022-09-13 ENCOUNTER — Other Ambulatory Visit (HOSPITAL_BASED_OUTPATIENT_CLINIC_OR_DEPARTMENT_OTHER): Payer: Self-pay

## 2022-09-13 DIAGNOSIS — M25562 Pain in left knee: Secondary | ICD-10-CM

## 2022-09-13 MED ORDER — PREGABALIN 25 MG PO CAPS
ORAL_CAPSULE | ORAL | 3 refills | Status: DC
Start: 2022-09-13 — End: 2023-02-12
  Filled 2022-09-13: qty 90, 37d supply, fill #0

## 2022-09-13 NOTE — Telephone Encounter (Signed)
I spent 5 total minutes of online digital evaluation and management services in this patient-initiated request for online care. 

## 2022-09-13 NOTE — Assessment & Plan Note (Signed)
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  NSAIDs and acetaminophen not effective, therapy was done 8 months ago, not effective.  He requested and I subsequently ordered the cervical epidural, he did not get this done.  He messages back now, he would like to try Lyrica again, we will start low-dose with a very gradual up taper.  I would like to see him back after 4 to 6 weeks of taper.

## 2022-09-20 ENCOUNTER — Other Ambulatory Visit (HOSPITAL_BASED_OUTPATIENT_CLINIC_OR_DEPARTMENT_OTHER): Payer: Self-pay

## 2022-09-26 ENCOUNTER — Ambulatory Visit (INDEPENDENT_AMBULATORY_CARE_PROVIDER_SITE_OTHER): Payer: Commercial Managed Care - PPO | Admitting: Family Medicine

## 2022-09-26 ENCOUNTER — Encounter: Payer: Self-pay | Admitting: Family Medicine

## 2022-09-26 ENCOUNTER — Other Ambulatory Visit (HOSPITAL_BASED_OUTPATIENT_CLINIC_OR_DEPARTMENT_OTHER): Payer: Self-pay

## 2022-09-26 VITALS — BP 101/67 | HR 91 | Ht 72.0 in | Wt 233.0 lb

## 2022-09-26 DIAGNOSIS — Z0000 Encounter for general adult medical examination without abnormal findings: Secondary | ICD-10-CM

## 2022-09-26 MED ORDER — FLUOXETINE HCL 20 MG PO CAPS
20.0000 mg | ORAL_CAPSULE | Freq: Every day | ORAL | 3 refills | Status: DC
  Filled 2022-09-26: qty 90, 90d supply, fill #0

## 2022-09-26 NOTE — Assessment & Plan Note (Signed)
Well adult Screenings: Up-to-date Immunizations: Up-to-date Anticipatory guidance/risk factor reduction: Recommendations per AVS. 

## 2022-09-26 NOTE — Progress Notes (Signed)
Nathaniel [name] - 57 y.o. male MRN [id number]  Date of birth: 1965-05-06  Subjective Chief Complaint  Patient presents with   Annual Exam    HPI Nathaniel [name] is a 57 year old male here today for annual exam.  He has noted increased symptoms of depression and recently restarted his fluoxetine.  He does need renewal on this.  He has only been back on this for about a week.    He has been dealing with some knee pain.  Seen by orthopedics and referred to physical therapy.  He is somewhat active throughout the week.  He feels like his diet is varied.  He is a non-smoker.  Denies alcohol use.  Review of Systems  Constitutional:  Negative for chills, fever, malaise/fatigue and weight loss.  HENT:  Negative for congestion, ear pain and sore throat.   Eyes:  Negative for blurred vision, double vision and pain.  Respiratory:  Negative for cough and shortness of breath.   Cardiovascular:  Negative for chest pain and palpitations.  Gastrointestinal:  Negative for abdominal pain, blood in stool, constipation, heartburn and nausea.  Genitourinary:  Negative for dysuria and urgency.  Musculoskeletal:  Negative for joint pain and myalgias.  Neurological:  Negative for dizziness and headaches.  Endo/Heme/Allergies:  Does not bruise/bleed easily.  Psychiatric/Behavioral:  Negative for depression. The patient is not nervous/anxious and does not have insomnia.     Allergies  Allergen Reactions   Cymbalta [Duloxetine Hcl] Other (See Comments)    Suicidal thoughts   Influenza Vaccines Other (See Comments)    Unknown reaction   Penicillins Other (See Comments)    Unknown - childhood allergy per mother   Tobacco Swelling    Arm swelled up with allergy testing    Ace Inhibitors Swelling    Angioedema with lisinopril   Iodinated Contrast Media Hives and Itching   Pioglitazone Other (See Comments)      headache   Promethazine Hcl Nausea And Vomiting        Reglan [Metoclopramide] Other (See Comments)     Jittery, increased anxiety    Past Medical History:  Diagnosis Date   Biceps tendinitis, left, distal 11/17/2020   Cervical radiculitis 01/25/2015   Chronic headaches 09/28/2011   Closed avulsion fracture of left ankle 02/24/2020   COVID-19 08/11/2020   Depression    Diabetes mellitus    type II   Fatigue 12/16/2008   Qualifier: Diagnosis of  By: Nena Jordan    GERD (gastroesophageal reflux disease)    Headache(784.0)    Hyperlipidemia 08/11/2020   Hypogonadism male    Neck pain 02/27/2013   Obstructive sleep apnea 06/09/2016   Unattended Home Sleep Test 07/05/16-AHI 14.7/hour, desaturation to 79%, body weight 189 pounds.  AHI around 2.5 usually 04/23/18    Pain in right knee 08/07/2018   Presence of insulin pump (external) (internal) 06/16/2021   Seasonal and perennial allergic rhinitis 09/28/2011   Sleep apnea    CPAP   Type 1 diabetes mellitus, uncontrolled 10/02/2015    Past Surgical History:  Procedure Laterality Date   NASAL SINUS SURGERY  04/1991   TONSILLECTOMY  1972    Social History   Socioeconomic History   Marital status: Married    Spouse name: Programmer, applications   Number of children: 2   Years of education: Not on file   Highest education level: Master's degree (e.g., MA, MS, MEng, MEd, MSW, MBA)  Occupational History    Employer: [employer]  Comment: Mental Health Case Management  Tobacco Use   Smoking status: Never    Passive exposure: Never   Smokeless tobacco: Never  Vaping Use   Vaping Use: Never used  Substance and Sexual Activity   Alcohol use: No   Drug use: No   Sexual activity: Not on file  Other Topics Concern   Not on file  Social History Narrative   He lives with wife and two children.   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   Highest level of education: master degree   Caffeine- diet soda 2 L a day   Social Determinants of Health   Financial Resource Strain: Low Risk  (06/15/2022)    Overall Financial Resource Strain (CARDIA)    Difficulty of Paying Living Expenses: Not hard at all  Food Insecurity: No Food Insecurity (06/15/2022)   Hunger Vital Sign    Worried About Running Out of Food in the Last Year: Never true    Ran Out of Food in the Last Year: Never true  Transportation Needs: No Transportation Needs (06/15/2022)   PRAPARE - Administrator, Civil Service (Medical): No    Lack of Transportation (Non-Medical): No  Physical Activity: Sufficiently Active (06/15/2022)   Exercise Vital Sign    Days of Exercise per Week: 4 days    Minutes of Exercise per Session: 40 min  Stress: Stress Concern Present (06/15/2022)   [employer] of Occupational Health - Occupational Stress Questionnaire    Feeling of Stress : To some extent  Social Connections: Moderately Integrated (06/15/2022)   Social Connection and Isolation Panel [NHANES]    Frequency of Communication with Friends and Family: Never    Frequency of Social Gatherings with Friends and Family: Once a week    Attends Religious Services: More than 4 times per year    Active Member of Clubs or Organizations: Yes    Attends Engineer, structural: More than 4 times per year    Marital Status: Married    Family History  Adopted: Yes  Problem Relation Age of Onset   Cancer Father 52       Prostate cancer   Diabetes Mellitus I Son     Health Maintenance  Topic Date Due   DTaP/Tdap/Td (2 - Td or Tdap) 03/01/2021   OPHTHALMOLOGY EXAM  09/26/2022 (Originally 03/29/2022)   Diabetic kidney evaluation - Urine ACR  12/17/2022 (Originally 01/05/2022)   COVID-19 Vaccine (4 - 2023-24 season) 12/17/2022 (Originally 11/19/2021)   Zoster Vaccines- Shingrix (1 of 2) 12/17/2022 (Originally 09/02/1984)   Hepatitis C Screening  09/26/2023 (Originally 09/03/1983)   HIV Screening  09/26/2023 (Originally 09/02/1980)   INFLUENZA VACCINE  10/20/2022   HEMOGLOBIN A1C  12/24/2022   Fecal DNA (Cologuard)  05/21/2023    FOOT EXAM  06/16/2023   Diabetic kidney evaluation - eGFR measurement  06/24/2023   HPV VACCINES  Aged Out     ----------------------------------------------------------------------------------------------------------------------------------------------------------------------------------------------------------------- Physical Exam BP 101/67 (BP Location: Left Arm, Patient Position: Sitting, Cuff Size: Large)   Pulse 91   Ht 6' (1.829 m)   Wt 233 lb (105.7 kg)   SpO2 97%   BMI 31.60 kg/m   Physical Exam Constitutional:      General: He is not in acute distress. HENT:     Head: Normocephalic and atraumatic.     Right Ear: Tympanic membrane and external ear normal.     Left Ear: Tympanic membrane and external ear normal.  Eyes:  General: No scleral icterus. Neck:     Thyroid: No thyromegaly.  Cardiovascular:     Rate and Rhythm: Normal rate and regular rhythm.     Heart sounds: Normal heart sounds.  Pulmonary:     Effort: Pulmonary effort is normal.     Breath sounds: Normal breath sounds.  Abdominal:     General: Bowel sounds are normal. There is no distension.     Palpations: Abdomen is soft.     Tenderness: There is no abdominal tenderness. There is no guarding.  Musculoskeletal:     Cervical back: Normal range of motion.  Lymphadenopathy:     Cervical: No cervical adenopathy.  Skin:    General: Skin is warm and dry.     Findings: No rash.  Neurological:     Mental Status: He is alert and oriented to person, place, and time.     Cranial Nerves: No cranial nerve deficit.     Motor: No abnormal muscle tone.  Psychiatric:        Mood and Affect: Mood normal.        Behavior: Behavior normal.     ------------------------------------------------------------------------------------------------------------------------------------------------------------------------------------------------------------------- Assessment and Plan  Well adult exam Well  adult Screenings: Up-to-date Immunizations: Up-to-date Anticipatory guidance/risk factor reduction: Recommendations per AVS   Meds ordered this encounter  Medications   FLUoxetine (PROZAC) 20 MG capsule    Sig: Take 1 capsule (20 mg total) by mouth daily.    Dispense:  90 capsule    Refill:  3    Return in about 4 months (around 01/27/2023) for F/u Mood.    This visit occurred during the SARS-CoV-2 public health emergency.  Safety protocols were in place, including screening questions prior to the visit, additional usage of staff PPE, and extensive cleaning of exam room while observing appropriate contact time as indicated for disinfecting solutions.

## 2022-09-26 NOTE — Patient Instructions (Signed)

## 2022-09-27 ENCOUNTER — Other Ambulatory Visit (HOSPITAL_BASED_OUTPATIENT_CLINIC_OR_DEPARTMENT_OTHER): Payer: Self-pay

## 2022-09-27 ENCOUNTER — Other Ambulatory Visit: Payer: Self-pay

## 2022-10-03 ENCOUNTER — Ambulatory Visit: Payer: Commercial Managed Care - PPO | Admitting: Physical Therapy

## 2022-10-14 ENCOUNTER — Other Ambulatory Visit (HOSPITAL_BASED_OUTPATIENT_CLINIC_OR_DEPARTMENT_OTHER): Payer: Self-pay

## 2022-10-17 ENCOUNTER — Other Ambulatory Visit (HOSPITAL_BASED_OUTPATIENT_CLINIC_OR_DEPARTMENT_OTHER): Payer: Self-pay

## 2022-10-17 MED ORDER — ROSUVASTATIN CALCIUM 10 MG PO TABS
10.0000 mg | ORAL_TABLET | Freq: Every day | ORAL | 1 refills | Status: DC
  Filled 2022-10-17: qty 90, 90d supply, fill #0
  Filled 2023-01-10: qty 90, 90d supply, fill #1

## 2022-10-24 DIAGNOSIS — Z9641 Presence of insulin pump (external) (internal): Secondary | ICD-10-CM | POA: Diagnosis not present

## 2022-10-24 DIAGNOSIS — I10 Essential (primary) hypertension: Secondary | ICD-10-CM | POA: Diagnosis not present

## 2022-10-24 DIAGNOSIS — E1065 Type 1 diabetes mellitus with hyperglycemia: Secondary | ICD-10-CM | POA: Diagnosis not present

## 2022-10-24 DIAGNOSIS — E785 Hyperlipidemia, unspecified: Secondary | ICD-10-CM | POA: Diagnosis not present

## 2022-10-25 ENCOUNTER — Other Ambulatory Visit (HOSPITAL_BASED_OUTPATIENT_CLINIC_OR_DEPARTMENT_OTHER): Payer: Self-pay

## 2022-10-28 ENCOUNTER — Other Ambulatory Visit (HOSPITAL_BASED_OUTPATIENT_CLINIC_OR_DEPARTMENT_OTHER): Payer: Self-pay

## 2022-10-28 ENCOUNTER — Other Ambulatory Visit: Payer: Self-pay | Admitting: Family Medicine

## 2022-10-28 DIAGNOSIS — R0989 Other specified symptoms and signs involving the circulatory and respiratory systems: Secondary | ICD-10-CM

## 2022-10-28 MED ORDER — ESOMEPRAZOLE MAGNESIUM 40 MG PO CPDR
40.0000 mg | DELAYED_RELEASE_CAPSULE | Freq: Every day | ORAL | 0 refills | Status: DC
Start: 2022-10-28 — End: 2023-01-19
  Filled 2022-10-28: qty 90, 90d supply, fill #0

## 2022-11-04 ENCOUNTER — Other Ambulatory Visit (HOSPITAL_BASED_OUTPATIENT_CLINIC_OR_DEPARTMENT_OTHER): Payer: Self-pay

## 2022-11-07 ENCOUNTER — Other Ambulatory Visit (HOSPITAL_BASED_OUTPATIENT_CLINIC_OR_DEPARTMENT_OTHER): Payer: Self-pay

## 2022-11-07 ENCOUNTER — Encounter: Payer: Self-pay | Admitting: Sports Medicine

## 2022-11-07 MED ORDER — INSULIN LISPRO 100 UNIT/ML IJ SOLN
80.0000 [IU] | Freq: Every day | INTRAMUSCULAR | 5 refills | Status: DC
  Filled 2022-11-07: qty 50, 62d supply, fill #0

## 2022-11-07 MED ORDER — INSULIN LISPRO 100 UNIT/ML IJ SOLN
100.0000 [IU] | Freq: Every day | INTRAMUSCULAR | 5 refills | Status: DC
  Filled 2022-11-07: qty 30, 30d supply, fill #0
  Filled 2022-12-07: qty 60, 60d supply, fill #1
  Filled 2022-12-07: qty 90, 90d supply, fill #1

## 2022-11-30 ENCOUNTER — Other Ambulatory Visit (HOSPITAL_BASED_OUTPATIENT_CLINIC_OR_DEPARTMENT_OTHER): Payer: Self-pay

## 2022-12-05 ENCOUNTER — Other Ambulatory Visit: Payer: Self-pay | Admitting: Internal Medicine

## 2022-12-05 ENCOUNTER — Other Ambulatory Visit (HOSPITAL_BASED_OUTPATIENT_CLINIC_OR_DEPARTMENT_OTHER): Payer: Self-pay

## 2022-12-06 ENCOUNTER — Other Ambulatory Visit (HOSPITAL_BASED_OUTPATIENT_CLINIC_OR_DEPARTMENT_OTHER): Payer: Self-pay

## 2022-12-06 MED ORDER — METFORMIN HCL ER 500 MG PO TB24
1000.0000 mg | ORAL_TABLET | Freq: Two times a day (BID) | ORAL | 3 refills | Status: DC
  Filled 2022-12-06: qty 360, 90d supply, fill #0
  Filled 2023-02-14 – 2023-02-20 (×3): qty 360, 90d supply, fill #1
  Filled 2023-10-27: qty 360, 90d supply, fill #2

## 2022-12-07 ENCOUNTER — Other Ambulatory Visit (HOSPITAL_BASED_OUTPATIENT_CLINIC_OR_DEPARTMENT_OTHER): Payer: Self-pay

## 2022-12-07 MED ORDER — METFORMIN HCL ER 500 MG PO TB24
2000.0000 mg | ORAL_TABLET | Freq: Every day | ORAL | 3 refills | Status: DC
  Filled 2022-12-07: qty 360, 90d supply, fill #0

## 2022-12-22 ENCOUNTER — Ambulatory Visit: Payer: 59 | Admitting: Family Medicine

## 2022-12-22 ENCOUNTER — Other Ambulatory Visit (HOSPITAL_BASED_OUTPATIENT_CLINIC_OR_DEPARTMENT_OTHER): Payer: Self-pay

## 2022-12-22 ENCOUNTER — Encounter: Payer: Self-pay | Admitting: Family Medicine

## 2022-12-22 VITALS — BP 117/71 | HR 63 | Ht 72.0 in | Wt 232.0 lb

## 2022-12-22 DIAGNOSIS — R0989 Other specified symptoms and signs involving the circulatory and respiratory systems: Secondary | ICD-10-CM

## 2022-12-22 DIAGNOSIS — J310 Chronic rhinitis: Secondary | ICD-10-CM

## 2022-12-22 DIAGNOSIS — M5412 Radiculopathy, cervical region: Secondary | ICD-10-CM | POA: Diagnosis not present

## 2022-12-22 MED ORDER — PREDNISONE 50 MG PO TABS
50.0000 mg | ORAL_TABLET | Freq: Every day | ORAL | 0 refills | Status: DC
  Filled 2022-12-22: qty 5, 5d supply, fill #0

## 2022-12-22 MED ORDER — IPRATROPIUM BROMIDE 0.03 % NA SOLN
2.0000 | Freq: Two times a day (BID) | NASAL | 12 refills | Status: DC
  Filled 2022-12-22: qty 30, 25d supply, fill #0

## 2022-12-22 NOTE — Assessment & Plan Note (Addendum)
Seen by ENT x2.  Tried multiple things including flonase and PPI without much improvement.  Will try atrovent nasal spray.  Discussed trial of astepro ifs still not improving.  Referral placed to allergist to see if this is contributing.

## 2022-12-22 NOTE — Patient Instructions (Signed)
Try ipratropium nasal spray consistently  If not improving over 2-3 weeks try astepro (over the counter).

## 2022-12-22 NOTE — Assessment & Plan Note (Signed)
Increased numbness over R arm and shoulder.  Adding burst of steroids x5 days.

## 2022-12-22 NOTE — Progress Notes (Signed)
Nathaniel [name] - 56 y.o. male MRN [id number]  Date of birth: 02-07-1966  Subjective Chief Complaint  Patient presents with   Dysphagia   Numbness    HPI Nathaniel [name] is a 57 y.o. male here today for follow up.   Continues to have sensation of needing to clear his throat frequently.  This is becoming more bothersome to him.  He has seen ENT but didn't feel like he got any answers from this consultation.  He is taking nexium for acid suppression.  He has tried Programmer[employer] systems for post nasal drip/rhinitis.   Prescribed ipratropium nasal spray but isn't sure that he tried this.  Denies dyspnea or swallowing difficulty.   He is also having numbness of the R arm.  Seen by Dr. Benjamin Stain for cervical radiculopathy previously.  MRI with cervical spondylosis and b/l neural foraminal narrowing.  Currently on lyrica.   ROS:  A comprehensive ROS was completed and negative except as noted per HPI  Allergies  Allergen Reactions   Cymbalta [Duloxetine Hcl] Other (See Comments)    Suicidal thoughts   Influenza Vaccines Other (See Comments)    Unknown reaction   Penicillins Other (See Comments)    Unknown - childhood allergy per mother   Tobacco Swelling    Arm swelled up with allergy testing    Ace Inhibitors Swelling    Angioedema with lisinopril   Iodinated Contrast Media Hives and Itching   Pioglitazone Other (See Comments)      headache   Promethazine Hcl Nausea And Vomiting        Reglan [Metoclopramide] Other (See Comments)    Jittery, increased anxiety    Past Medical History:  Diagnosis Date   Biceps tendinitis, left, distal 11/17/2020   Cervical radiculitis 01/25/2015   Chronic headaches 09/28/2011   Closed avulsion fracture of left ankle 02/24/2020   COVID-19 08/11/2020   Depression    Diabetes mellitus    type II   Fatigue 12/16/2008   Qualifier: Diagnosis of  By: Nena Jordan    GERD (gastroesophageal reflux disease)    Headache(784.0)     Hyperlipidemia 08/11/2020   Hypogonadism male    Neck pain 02/27/2013   Obstructive sleep apnea 06/09/2016   Unattended Home Sleep Test 07/05/16-AHI 14.7/hour, desaturation to 79%, body weight 189 pounds.  AHI around 2.5 usually 04/23/18    Pain in right knee 08/07/2018   Presence of insulin pump (external) (internal) 06/16/2021   Seasonal and perennial allergic rhinitis 09/28/2011   Sleep apnea    CPAP   Type 1 diabetes mellitus, uncontrolled 10/02/2015    Past Surgical History:  Procedure Laterality Date   NASAL SINUS SURGERY  04/1991   TONSILLECTOMY  1972    Social History   Socioeconomic History   Marital status: Married    Spouse name: Programmer, applications   Number of children: 2   Years of education: Not on file   Highest education level: Master's degree (e.g., MA, MS, MEng, MEd, MSW, MBA)  Occupational History    Employer: [employer]    Comment: Mental Health Case Management  Tobacco Use   Smoking status: Never    Passive exposure: Never   Smokeless tobacco: Never  Vaping Use   Vaping status: Never Used  Substance and Sexual Activity   Alcohol use: No   Drug use: No   Sexual activity: Not on file  Other Topics Concern   Not on file  Social History Narrative  He lives with wife and two children.   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   Highest level of education: master degree   Caffeine- diet soda 2 L a day   Social Determinants of Health   Financial Resource Strain: Low Risk  (06/15/2022)   Overall Financial Resource Strain (CARDIA)    Difficulty of Paying Living Expenses: Not hard at all  Food Insecurity: No Food Insecurity (06/15/2022)   Hunger Vital Sign    Worried About Running Out of Food in the Last Year: Never true    Ran Out of Food in the Last Year: Never true  Transportation Needs: No Transportation Needs (06/15/2022)   PRAPARE - Administrator, Civil Service (Medical): No    Lack of Transportation  (Non-Medical): No  Physical Activity: Sufficiently Active (06/15/2022)   Exercise Vital Sign    Days of Exercise per Week: 4 days    Minutes of Exercise per Session: 40 min  Stress: Stress Concern Present (06/15/2022)   [employer] of Occupational Health - Occupational Stress Questionnaire    Feeling of Stress : To some extent  Social Connections: Unknown (10/01/2022)   Received from [facility]   Social Network    Social Network: Not on file    Family History  Adopted: Yes  Problem Relation Age of Onset   Cancer Father 33       Prostate cancer   Diabetes Mellitus I Son     Health Maintenance  Topic Date Due   DTaP/Tdap/Td (2 - Td or Tdap) 03/01/2021   Diabetic kidney evaluation - Urine ACR  01/05/2022   OPHTHALMOLOGY EXAM  12/28/2022 (Originally 03/29/2022)   COVID-19 Vaccine (4 - 2023-24 season) 01/07/2023 (Originally 11/20/2022)   Zoster Vaccines- Shingrix (1 of 2) 03/24/2023 (Originally 09/02/1984)   Hepatitis C Screening  09/26/2023 (Originally 09/03/1983)   HIV Screening  09/26/2023 (Originally 09/02/1980)   HEMOGLOBIN A1C  12/24/2022   Fecal DNA (Cologuard)  05/21/2023   FOOT EXAM  06/16/2023   Diabetic kidney evaluation - eGFR measurement  06/24/2023   INFLUENZA VACCINE  Completed   HPV VACCINES  Aged Out     ----------------------------------------------------------------------------------------------------------------------------------------------------------------------------------------------------------------- Physical Exam BP 117/71 (BP Location: Left Arm, Patient Position: Sitting, Cuff Size: Normal)   Pulse 63   Ht 6' (1.829 m)   Wt 232 lb (105.2 kg)   SpO2 96%   BMI 31.46 kg/m   Physical Exam Constitutional:      Appearance: Normal appearance.  HENT:     Head: Normocephalic and atraumatic.  Eyes:     General: No scleral icterus. Cardiovascular:     Rate and Rhythm: Normal rate and regular rhythm.  Pulmonary:     Effort: Pulmonary effort is  normal.     Breath sounds: Normal breath sounds.  Musculoskeletal:     Cervical back: Neck supple.  Neurological:     Mental Status: He is alert.  Psychiatric:        Mood and Affect: Mood normal.        Behavior: Behavior normal.     ------------------------------------------------------------------------------------------------------------------------------------------------------------------------------------------------------------------- Assessment and Plan  Chronic throat clearing Seen by ENT x2.  Tried multiple things including flonase and PPI without much improvement.  Will try atrovent nasal spray.  Discussed trial of astepro ifs still not improving.  Referral placed to allergist to see if this is contributing.    Cervical radiculitis Increased numbness over R arm and shoulder.  Adding burst of steroids x5 days.  Meds ordered this encounter  Medications   ipratropium (ATROVENT) 0.03 % nasal spray    Sig: Place 2 sprays into both nostrils every 12 (twelve) hours.    Dispense:  30 mL    Refill:  12   predniSONE (DELTASONE) 50 MG tablet    Sig: Take 1 tablet (50 mg total) by mouth daily for 5 days.    Dispense:  5 tablet    Refill:  0    No follow-ups on file.    This visit occurred during the SARS-CoV-2 public health emergency.  Safety protocols were in place, including screening questions prior to the visit, additional usage of staff PPE, and extensive cleaning of exam room while observing appropriate contact time as indicated for disinfecting solutions.

## 2022-12-23 ENCOUNTER — Ambulatory Visit: Payer: Commercial Managed Care - PPO | Admitting: Sports Medicine

## 2022-12-23 ENCOUNTER — Encounter: Payer: Self-pay | Admitting: Family Medicine

## 2022-12-26 ENCOUNTER — Other Ambulatory Visit (HOSPITAL_COMMUNITY): Payer: Self-pay

## 2023-01-03 ENCOUNTER — Other Ambulatory Visit (HOSPITAL_BASED_OUTPATIENT_CLINIC_OR_DEPARTMENT_OTHER): Payer: Self-pay

## 2023-01-03 MED ORDER — FREESTYLE LITE TEST VI STRP
ORAL_STRIP | Freq: Three times a day (TID) | 5 refills | Status: AC
  Filled 2023-01-03: qty 100, 30d supply, fill #0
  Filled 2023-06-12: qty 100, 30d supply, fill #1
  Filled 2023-08-21: qty 100, 30d supply, fill #2

## 2023-01-04 ENCOUNTER — Other Ambulatory Visit (HOSPITAL_BASED_OUTPATIENT_CLINIC_OR_DEPARTMENT_OTHER): Payer: Self-pay

## 2023-01-19 ENCOUNTER — Other Ambulatory Visit: Payer: Self-pay | Admitting: Family Medicine

## 2023-01-19 ENCOUNTER — Other Ambulatory Visit (HOSPITAL_BASED_OUTPATIENT_CLINIC_OR_DEPARTMENT_OTHER): Payer: Self-pay

## 2023-01-19 DIAGNOSIS — R0989 Other specified symptoms and signs involving the circulatory and respiratory systems: Secondary | ICD-10-CM

## 2023-01-19 MED ORDER — ESOMEPRAZOLE MAGNESIUM 40 MG PO CPDR
40.0000 mg | DELAYED_RELEASE_CAPSULE | Freq: Every day | ORAL | 0 refills | Status: DC
  Filled 2023-01-19: qty 90, 90d supply, fill #0

## 2023-01-24 ENCOUNTER — Other Ambulatory Visit (HOSPITAL_BASED_OUTPATIENT_CLINIC_OR_DEPARTMENT_OTHER): Payer: Self-pay

## 2023-01-24 MED ORDER — LYUMJEV 100 UNIT/ML IJ SOLN
80.0000 [IU] | INTRAMUSCULAR | 5 refills | Status: DC
  Filled 2023-01-24: qty 80, 88d supply, fill #0
  Filled 2023-04-12: qty 70, 87d supply, fill #1
  Filled 2023-04-12: qty 80, 88d supply, fill #1
  Filled 2023-06-12: qty 20, 25d supply, fill #2

## 2023-01-26 ENCOUNTER — Ambulatory Visit: Payer: Self-pay | Admitting: Allergy & Immunology

## 2023-01-27 ENCOUNTER — Ambulatory Visit: Payer: Commercial Managed Care - PPO | Admitting: Family Medicine

## 2023-02-12 ENCOUNTER — Ambulatory Visit
Admission: EM | Admit: 2023-02-12 | Discharge: 2023-02-12 | Disposition: A | Discharge status: Home / Self Care | Payer: 59 | Attending: Family Medicine | Admitting: Family Medicine

## 2023-02-12 ENCOUNTER — Other Ambulatory Visit: Payer: Self-pay

## 2023-02-12 DIAGNOSIS — Z8639 Personal history of other endocrine, nutritional and metabolic disease: Secondary | ICD-10-CM | POA: Diagnosis not present

## 2023-02-12 DIAGNOSIS — M79675 Pain in left toe(s): Secondary | ICD-10-CM | POA: Diagnosis not present

## 2023-02-12 MED ORDER — CEFADROXIL 500 MG PO CAPS: 500.0000 mg | ORAL_CAPSULE | Freq: Two times a day (BID) | ORAL | 0 refills | Status: DC

## 2023-02-12 NOTE — ED Triage Notes (Signed)
C/o left foot 2nd and 3rd toe pain. No known injury.

## 2023-02-12 NOTE — ED Provider Notes (Signed)
[facility]    CSN: [id number] Arrival date & time: 02/12/23  1255      History   Chief Complaint Chief Complaint  Patient presents with   Foot Pain    HPI Nathaniel [name] is a 57 y.o. male.   Very pleasant gentleman.  He is a type I diabetic.  Last hemoglobin A1c measured 6 months ago was 8.6.  He is here for pain in toes.  There is redness and swelling.  He is worried about infection.  Does not have neuropathy.  He did not have any trauma.  He has had no fever.  No change in his blood sugars    Past Medical History:  Diagnosis Date   Biceps tendinitis, left, distal 11/17/2020   Cervical radiculitis 01/25/2015   Chronic headaches 09/28/2011   Closed avulsion fracture of left ankle 02/24/2020   COVID-19 08/11/2020   Depression    Diabetes mellitus    type II   Fatigue 12/16/2008   Qualifier: Diagnosis of  By: Nena Jordan    GERD (gastroesophageal reflux disease)    Headache(784.0)    Hyperlipidemia 08/11/2020   Hypogonadism male    Neck pain 02/27/2013   Obstructive sleep apnea 06/09/2016   Unattended Home Sleep Test 07/05/16-AHI 14.7/hour, desaturation to 79%, body weight 189 pounds.  AHI around 2.5 usually 04/23/18    Pain in right knee 08/07/2018   Presence of insulin pump (external) (internal) 06/16/2021   Seasonal and perennial allergic rhinitis 09/28/2011   Sleep apnea    CPAP   Type 1 diabetes mellitus, uncontrolled 10/02/2015    Patient Active Problem List   Diagnosis Date Noted   Pain in left knee 09/09/2022   Primary osteoarthritis of left knee 05/02/2022   Chronic throat clearing 07/20/2021   Presence of insulin pump (external) (internal) 06/16/2021   Biceps tendinitis, left, distal 11/17/2020   Hyperlipidemia 08/11/2020   COVID-19 08/11/2020   Closed avulsion fracture of left ankle 02/24/2020   GERD (gastroesophageal reflux disease) 11/21/2019   Well adult exam 04/30/2019   Pain in right knee 08/07/2018   Obstructive sleep  apnea 06/09/2016   Uncontrolled type 1 diabetes mellitus with hyperglycemia (HCC) 10/02/2015   Cervical radiculitis 01/25/2015   Neck pain 02/27/2013   MDD (major depressive disorder) 12/19/2012   Chronic headaches 09/28/2011   Seasonal and perennial allergic rhinitis 09/28/2011   Fatigue 12/16/2008    Past Surgical History:  Procedure Laterality Date   NASAL SINUS SURGERY  04/1991   TONSILLECTOMY  1972       Home Medications    Prior to Admission medications   Medication Sig Start Date End Date Taking? Authorizing Provider  cefadroxil (DURICEF) 500 MG capsule Take 1 capsule (500 mg total) by mouth 2 (two) times daily. 02/12/23  Yes Eustace Moore, MD  FLUoxetine (PROZAC) 20 MG tablet Take 20 mg by mouth daily.   Yes [provider]  Continuous Glucose Sensor (FREESTYLE LIBRE 3 SENSOR) MISC Apply on to the skin every 14 days. 02/16/22   Carlus Pavlov, MD  Continuous Glucose Sensor (FREESTYLE LIBRE 3 SENSOR) MISC Apply 1 sensor every 14 days as directed 04/21/22     esomeprazole (NEXIUM) 40 MG capsule Take 1 capsule (40 mg total) by mouth daily at dinner time. 01/19/23   Everrett Coombe, DO  fexofenadine (ALLEGRA) 180 MG tablet Take 1 tablet (180 mg total) by mouth daily. 12/20/21   Everrett Coombe, DO  glucose blood (FREESTYLE LITE) test strip  Check blood sugars 3 (three) times daily. 01/03/23   Mariea Stable, NP  Insulin Disposable Pump (OMNIPOD 5 G6 INTRO, GEN 5,) KIT Inject into the skin. 10/05/22   [provider]  insulin lispro (HUMALOG KWIKPEN) 200 UNIT/ML KwikPen Inject under the skin up to 50 units of insulin daily as advised 02/03/22   Carlus Pavlov, MD  insulin lispro (HUMALOG) 100 UNIT/ML injection Inject 0.8 mLs (80 Units total) into the skin via pump daily. 04/01/22     insulin lispro (HUMALOG) 100 UNIT/ML injection Inject 1 mL (100 Units total) into the skin daily. 04/05/22     insulin lispro (HUMALOG) 100 UNIT/ML injection Inject 100 units  daily via insulin pump 11/07/22     insulin lispro (HUMALOG) 100 UNIT/ML injection Inject 0.8 mLs (80 Units total) into the skin daily via pump. 11/07/22     Insulin Lispro-aabc (LYUMJEV) 100 UNIT/ML SOLN Use 80 Units daily via insulin pump. 01/24/23     ipratropium (ATROVENT) 0.03 % nasal spray Place 2 sprays into both nostrils every 12 (twelve) hours. 12/22/22   Everrett Coombe, DO  metFORMIN (GLUCOPHAGE-XR) 500 MG 24 hr tablet Take 2 tablets (1,000 mg total) by mouth 2 (two) times daily with meals. 12/06/22     metFORMIN (GLUCOPHAGE-XR) 500 MG 24 hr tablet Take 4 tablets (2,000 mg total) by mouth daily with breakfast. 12/07/22     rosuvastatin (CRESTOR) 10 MG tablet Take 1 tablet (10 mg total) by mouth daily. 10/17/22       Family History Family History  Adopted: Yes  Problem Relation Age of Onset   Cancer Father 38       Prostate cancer   Diabetes Mellitus I Son     Social History Social History   Tobacco Use   Smoking status: Never    Passive exposure: Never   Smokeless tobacco: Never  Vaping Use   Vaping status: Never Used  Substance Use Topics   Alcohol use: No   Drug use: No     Allergies   Cymbalta [duloxetine hcl], Influenza vaccines, Penicillins, Tobacco, Ace inhibitors, Iodinated contrast media, Pioglitazone, Promethazine hcl, and Reglan [metoclopramide]   Review of Systems Review of Systems See HPI  Physical Exam Triage Vital Signs ED Triage Vitals  Encounter Vitals Group     BP 02/12/23 1302 (!) 147/75     Systolic BP Percentile --      Diastolic BP Percentile --      Pulse Rate 02/12/23 1302 87     Resp 02/12/23 1302 16     Temp 02/12/23 1302 97.8 F (36.6 C)     Temp src --      SpO2 02/12/23 1302 97 %     Weight --      Height --      Head Circumference --      Peak Flow --      Pain Score 02/12/23 1305 5     Pain Loc --      Pain Education --      Exclude from Growth Chart --    No data found.  Updated Vital Signs BP (!) 147/75   Pulse 87    Temp 97.8 F (36.6 C)   Resp 16   SpO2 97%   :     Physical Exam Constitutional:      General: He is not in acute distress.    Appearance: He is well-developed.  HENT:     Head: Normocephalic and atraumatic.  Eyes:     Conjunctiva/sclera: Conjunctivae normal.     Pupils: Pupils are equal, round, and reactive to light.  Cardiovascular:     Rate and Rhythm: Normal rate.  Pulmonary:     Effort: Pulmonary effort is normal. No respiratory distress.  Musculoskeletal:        General: Tenderness present. Normal range of motion.     Cervical back: Normal range of motion.       Feet:  Feet:     Comments: The edges of the second and fifth toes have erythema on the medial toe and cuticle.  Tender.  Warm.  No open wound.  No purulence.  Looks like early paronychia Skin:    General: Skin is warm and dry.  Neurological:     Mental Status: He is alert.      UC Treatments / Results  Labs (all labs ordered are listed, but only abnormal results are displayed) Labs Reviewed - No data to display  EKG   Radiology No results found.  Procedures Procedures (including critical care time)  Medications Ordered in UC Medications - No data to display  Initial Impression / Assessment and Plan / UC Course  I have reviewed the triage vital signs and the nursing notes.  Pertinent labs & imaging results that were available during my care of the patient were reviewed by me and considered in my medical decision making (see chart for details).      Final Clinical Impressions(s) / UC Diagnoses   Final diagnoses:  Pain of toe of left foot  History of type 1 diabetes mellitus     Discharge Instructions      Take the antibiotic 2 times a day.  I would take 2 doses today (1 now, then 1 at bedtime ) Consider warm soaks for 20 minutes a couple times a day See your doctor if not improving by next week     ED Prescriptions     Medication Sig Dispense Auth. Provider   cefadroxil  (DURICEF) 500 MG capsule Take 1 capsule (500 mg total) by mouth 2 (two) times daily. 14 capsule Eustace Moore, MD      PDMP not reviewed this encounter.   Eustace Moore, MD 02/12/23 1357

## 2023-02-12 NOTE — Discharge Instructions (Signed)
Take the antibiotic 2 times a day.  I would take 2 doses today (1 now, then 1 at bedtime ) Consider warm soaks for 20 minutes a couple times a day See your doctor if not improving by next week

## 2023-02-14 ENCOUNTER — Other Ambulatory Visit (HOSPITAL_BASED_OUTPATIENT_CLINIC_OR_DEPARTMENT_OTHER): Payer: Self-pay

## 2023-02-15 ENCOUNTER — Other Ambulatory Visit (HOSPITAL_BASED_OUTPATIENT_CLINIC_OR_DEPARTMENT_OTHER): Payer: Self-pay

## 2023-02-24 LAB — HM DIABETES EYE EXAM

## 2023-02-27 ENCOUNTER — Other Ambulatory Visit (HOSPITAL_BASED_OUTPATIENT_CLINIC_OR_DEPARTMENT_OTHER): Payer: Self-pay

## 2023-03-08 NOTE — Telephone Encounter (Signed)
Results abstracted and Care Team updated

## 2023-03-21 ENCOUNTER — Encounter: Payer: Self-pay | Admitting: Family Medicine

## 2023-03-27 ENCOUNTER — Other Ambulatory Visit (HOSPITAL_BASED_OUTPATIENT_CLINIC_OR_DEPARTMENT_OTHER): Payer: Self-pay

## 2023-04-12 ENCOUNTER — Other Ambulatory Visit (HOSPITAL_BASED_OUTPATIENT_CLINIC_OR_DEPARTMENT_OTHER): Payer: Self-pay

## 2023-04-13 ENCOUNTER — Other Ambulatory Visit (HOSPITAL_BASED_OUTPATIENT_CLINIC_OR_DEPARTMENT_OTHER): Payer: Self-pay

## 2023-04-13 ENCOUNTER — Other Ambulatory Visit: Payer: Self-pay

## 2023-04-24 ENCOUNTER — Other Ambulatory Visit (HOSPITAL_BASED_OUTPATIENT_CLINIC_OR_DEPARTMENT_OTHER): Payer: Self-pay

## 2023-04-25 ENCOUNTER — Other Ambulatory Visit (HOSPITAL_BASED_OUTPATIENT_CLINIC_OR_DEPARTMENT_OTHER): Payer: Self-pay

## 2023-04-25 MED ORDER — FREESTYLE LIBRE 3 PLUS SENSOR MISC
11 refills | Status: AC
  Filled 2023-04-25: qty 2, 28d supply, fill #0
  Filled 2023-05-26: qty 2, 28d supply, fill #1
  Filled 2023-08-21: qty 2, 28d supply, fill #2
  Filled 2023-09-18 (×2): qty 2, 28d supply, fill #3
  Filled 2023-10-16: qty 2, 30d supply, fill #4
  Filled 2023-11-15: qty 2, 30d supply, fill #5
  Filled 2023-12-19: qty 2, 30d supply, fill #6

## 2023-04-26 ENCOUNTER — Other Ambulatory Visit (HOSPITAL_BASED_OUTPATIENT_CLINIC_OR_DEPARTMENT_OTHER): Payer: Self-pay

## 2023-04-26 ENCOUNTER — Other Ambulatory Visit: Payer: Self-pay | Admitting: Family Medicine

## 2023-04-26 ENCOUNTER — Encounter: Payer: Self-pay | Admitting: Family Medicine

## 2023-04-26 DIAGNOSIS — R0989 Other specified symptoms and signs involving the circulatory and respiratory systems: Secondary | ICD-10-CM

## 2023-04-26 MED ORDER — ROSUVASTATIN CALCIUM 10 MG PO TABS
10.0000 mg | ORAL_TABLET | Freq: Every day | ORAL | 1 refills | Status: DC
  Filled 2023-04-26: qty 90, 90d supply, fill #0
  Filled 2023-08-01: qty 90, 90d supply, fill #1

## 2023-04-26 MED ORDER — ESOMEPRAZOLE MAGNESIUM 40 MG PO CPDR
40.0000 mg | DELAYED_RELEASE_CAPSULE | Freq: Every day | ORAL | 0 refills | Status: DC
  Filled 2023-04-26: qty 90, 90d supply, fill #0

## 2023-05-03 ENCOUNTER — Encounter: Payer: Self-pay | Admitting: Family Medicine

## 2023-05-03 ENCOUNTER — Other Ambulatory Visit (HOSPITAL_BASED_OUTPATIENT_CLINIC_OR_DEPARTMENT_OTHER): Payer: Self-pay

## 2023-05-03 ENCOUNTER — Other Ambulatory Visit: Payer: Self-pay | Admitting: Family Medicine

## 2023-05-03 MED ORDER — FLUOXETINE HCL 20 MG PO CAPS
20.0000 mg | ORAL_CAPSULE | Freq: Every day | ORAL | 3 refills | Status: DC
  Filled 2023-05-03: qty 90, 90d supply, fill #0
  Filled 2023-08-16: qty 90, 90d supply, fill #1
  Filled 2023-11-15: qty 90, 90d supply, fill #2

## 2023-05-04 ENCOUNTER — Other Ambulatory Visit: Payer: Self-pay

## 2023-05-04 ENCOUNTER — Other Ambulatory Visit (HOSPITAL_BASED_OUTPATIENT_CLINIC_OR_DEPARTMENT_OTHER): Payer: Self-pay

## 2023-05-04 MED ORDER — METFORMIN HCL ER 500 MG PO TB24
1000.0000 mg | ORAL_TABLET | Freq: Every evening | ORAL | 1 refills | Status: DC
  Filled 2023-05-04 – 2023-05-15 (×2): qty 180, 90d supply, fill #0

## 2023-05-04 MED ORDER — LOSARTAN POTASSIUM 25 MG PO TABS
25.0000 mg | ORAL_TABLET | Freq: Every day | ORAL | 5 refills | Status: DC
  Filled 2023-05-04: qty 30, 30d supply, fill #0
  Filled 2023-05-29 (×2): qty 30, 30d supply, fill #1
  Filled 2023-06-28: qty 30, 30d supply, fill #2
  Filled 2023-08-07: qty 30, 30d supply, fill #3
  Filled 2023-09-08: qty 30, 30d supply, fill #4
  Filled 2023-10-11: qty 30, 30d supply, fill #5

## 2023-05-15 ENCOUNTER — Other Ambulatory Visit: Payer: Self-pay

## 2023-05-15 ENCOUNTER — Other Ambulatory Visit (HOSPITAL_BASED_OUTPATIENT_CLINIC_OR_DEPARTMENT_OTHER): Payer: Self-pay

## 2023-05-22 ENCOUNTER — Other Ambulatory Visit (HOSPITAL_BASED_OUTPATIENT_CLINIC_OR_DEPARTMENT_OTHER): Payer: Self-pay

## 2023-05-22 ENCOUNTER — Telehealth: Admitting: Physician Assistant

## 2023-05-22 DIAGNOSIS — R21 Rash and other nonspecific skin eruption: Secondary | ICD-10-CM

## 2023-05-22 MED ORDER — NYSTATIN-TRIAMCINOLONE 100000-0.1 UNIT/GM-% EX OINT
1.0000 | TOPICAL_OINTMENT | Freq: Two times a day (BID) | CUTANEOUS | 0 refills | Status: DC
Start: 2023-05-22 — End: 2023-10-19
  Filled 2023-05-22: qty 30, 15d supply, fill #0

## 2023-05-22 NOTE — Progress Notes (Signed)
 E Visit for Rash  We are sorry that you are not feeling well. Here is how we plan to help!  Based upon your presentation it appears you have a possible fungal infection versus eczematous-type rash.  I have prescribed: and Nystatin-Triamcinolone cream apply to the affected area twice daily   HOME CARE:  Take cool showers and avoid direct sunlight. Apply cool compress or wet dressings. Take a bath in an oatmeal bath.  Sprinkle content of one Aveeno packet under running faucet with comfortably warm water.  Bathe for 15-20 minutes, 1-2 times daily.  Pat dry with a towel. Do not rub the rash. Use hydrocortisone cream. Take an antihistamine like Benadryl for widespread rashes that itch.  The adult dose of Benadryl is 25-50 mg by mouth 4 times daily. Caution:  This type of medication may cause sleepiness.  Do not drink alcohol, drive, or operate dangerous machinery while taking antihistamines.  Do not take these medications if you have prostate enlargement.  Read package instructions thoroughly on all medications that you take.  GET HELP RIGHT AWAY IF:  Symptoms don't go away after treatment. Severe itching that persists. If you rash spreads or swells. If you rash begins to smell. If it blisters and opens or develops a yellow-brown crust. You develop a fever. You have a sore throat. You become short of breath.  MAKE SURE YOU:  Understand these instructions. Will watch your condition. Will get help right away if you are not doing well or get worse.  Thank you for choosing an e-visit.  Your e-visit answers were reviewed by a board certified advanced clinical practitioner to complete your personal care plan. Depending upon the condition, your plan could have included both over the counter or prescription medications.  Please review your pharmacy choice. Make sure the pharmacy is open so you can pick up prescription now. If there is a problem, you may contact your provider through [employer] and have the prescription routed to another pharmacy.  Your safety is important to [place]. If you have drug allergies check your prescription carefully.   For the next 24 hours you can use MyChart to ask questions about today's visit, request a non-urgent call back, or ask for a work or school excuse. You will get an email in the next two days asking about your experience. I hope that your e-visit has been valuable and will speed your recovery.   I have spent 5 minutes in review of e-visit questionnaire, review and updating patient chart, medical decision making and response to patient.   Margaretann Loveless, PA-C

## 2023-05-24 ENCOUNTER — Encounter: Payer: Self-pay | Admitting: Family Medicine

## 2023-05-24 NOTE — Telephone Encounter (Signed)
 Patient scheduled.

## 2023-05-26 ENCOUNTER — Other Ambulatory Visit

## 2023-05-26 ENCOUNTER — Encounter: Payer: Self-pay | Admitting: Sports Medicine

## 2023-05-26 ENCOUNTER — Other Ambulatory Visit (HOSPITAL_BASED_OUTPATIENT_CLINIC_OR_DEPARTMENT_OTHER): Payer: Self-pay

## 2023-05-26 ENCOUNTER — Ambulatory Visit: Admitting: Sports Medicine

## 2023-05-26 ENCOUNTER — Other Ambulatory Visit: Payer: Self-pay

## 2023-05-26 DIAGNOSIS — N5082 Scrotal pain: Secondary | ICD-10-CM | POA: Insufficient documentation

## 2023-05-26 MED ORDER — CIPROFLOXACIN HCL 750 MG PO TABS
750.0000 mg | ORAL_TABLET | Freq: Two times a day (BID) | ORAL | 0 refills | Status: AC
  Filled 2023-05-26: qty 14, 7d supply, fill #0

## 2023-05-26 MED ORDER — DOXYCYCLINE HYCLATE 100 MG PO TABS
100.0000 mg | ORAL_TABLET | Freq: Two times a day (BID) | ORAL | 0 refills | Status: AC
  Filled 2023-05-26: qty 14, 7d supply, fill #0

## 2023-05-26 MED ORDER — MELOXICAM 15 MG PO TABS
ORAL_TABLET | ORAL | 3 refills | Status: AC
Start: 2023-05-26 — End: 2023-09-23
  Filled 2023-05-26: qty 30, 30d supply, fill #0
  Filled 2023-08-23: qty 30, 30d supply, fill #1

## 2023-05-26 NOTE — Assessment & Plan Note (Signed)
 This is a very pleasant 58 year old male with history of uncontrolled diabetes, he comes in with a couple of weeks of increasing pain right hemiscrotum. He does have a history of intermittent scrotal pain in the past and has been told he had epididymitis. He denies any fevers or chills, no trauma, no overt burning or pain with urination or penile discharge. On exam he does have scrotal erythema, minimal tenderness particularly lateral aspect of the right testicle but not so much over the top, he does have a bilateral palpable varicocele. Differential is broad and includes epididymitis, varicocele, considering uncontrolled diabetes and erythema, Fournier's gangrene is also in the differential, I would like an aggressive workup including CBC with differential, urinalysis. We will add double coverage antibiotics, ultrasound, as well as close follow-up. If leukocytosis noted will get more urgent urology consultation and likely a CT pelvis with contrast.

## 2023-05-26 NOTE — Progress Notes (Signed)
    Procedures performed today:    None.  Independent interpretation of notes and tests performed by another provider:   None.  Brief History, Exam, Impression, and Recommendations:    Scrotal pain This is a very pleasant 58 year old male with history of uncontrolled diabetes, he comes in with a couple of weeks of increasing pain right hemiscrotum. He does have a history of intermittent scrotal pain in the past and has been told he had epididymitis. He denies any fevers or chills, no trauma, no overt burning or pain with urination or penile discharge. On exam he does have scrotal erythema, minimal tenderness particularly lateral aspect of the right testicle but not so much over the top, he does have a bilateral palpable varicocele. Differential is broad and includes epididymitis, varicocele, considering uncontrolled diabetes and erythema, Fournier's gangrene is also in the differential, I would like an aggressive workup including CBC with differential, urinalysis. We will add double coverage antibiotics, ultrasound, as well as close follow-up. If leukocytosis noted will get more urgent urology consultation and likely a CT pelvis with contrast.    ____________________________________________ Ihor Austin. Benjamin Stain, M.D., ABFM., CAQSM., AME. Primary Care and Sports Medicine [facility]  Adjunct Professor of Family Medicine  [place] [employer] [employer]  Restaurant manager, fast food

## 2023-05-28 LAB — UA/M W/RFLX CULTURE, ROUTINE
Bilirubin, UA: NEGATIVE
Glucose, UA: NEGATIVE
Ketones, UA: NEGATIVE
Nitrite, UA: NEGATIVE
Protein,UA: NEGATIVE
RBC, UA: NEGATIVE
Specific Gravity, UA: 1.009 (ref 1.005–1.030)
Urobilinogen, Ur: 0.2 mg/dL (ref 0.2–1.0)
pH, UA: 8 — ABNORMAL HIGH (ref 5.0–7.5)

## 2023-05-28 LAB — COMPREHENSIVE METABOLIC PANEL WITH GFR
Alkaline Phosphatase: 82 IU/L (ref 44–121)
CO2: 24 mmol/L (ref 20–29)
Calcium: 9.7 mg/dL (ref 8.7–10.2)
Chloride: 99 mmol/L (ref 96–106)
Creatinine, Ser: 1.13 mg/dL (ref 0.76–1.27)
Glucose: 131 mg/dL — ABNORMAL HIGH (ref 70–99)
Potassium: 4.6 mmol/L (ref 3.5–5.2)
Sodium: 141 mmol/L (ref 134–144)
eGFR: 76 mL/min/1.73 (ref >59)

## 2023-05-28 LAB — CBC WITH DIFFERENTIAL/PLATELET
Basophils Absolute: 0 x10E3/uL (ref 0.0–0.2)
Basos: 1 %
EOS (ABSOLUTE): 0.1 10*3/uL (ref 0.0–0.4)
Eos: 2 %
Hematocrit: 42 % (ref 37.5–51.0)
Hemoglobin: 13.6 g/dL (ref 13.0–17.7)
Immature Grans (Abs): 0.1 10*3/uL (ref 0.0–0.1)
Immature Granulocytes: 1 %
Lymphocytes Absolute: 1.7 x10E3/uL (ref 0.7–3.1)
Lymphs: 26 %
MCH: 28.3 pg (ref 26.6–33.0)
MCHC: 32.4 g/dL (ref 31.5–35.7)
MCV: 87 fL (ref 79–97)
Monocytes Absolute: 0.7 10*3/uL (ref 0.1–0.9)
Monocytes: 10 %
Neutrophils Absolute: 4.1 10*3/uL (ref 1.4–7.0)
Neutrophils: 60 %
Platelets: 243 10*3/uL (ref 150–450)
RBC: 4.81 x10E6/uL (ref 4.14–5.80)
RDW: 12.2 % (ref 11.6–15.4)
WBC: 6.7 10*3/uL (ref 3.4–10.8)

## 2023-05-28 LAB — COMPREHENSIVE METABOLIC PANEL
ALT: 26 IU/L (ref 0–44)
AST: 26 IU/L (ref 0–40)
Albumin: 4.7 g/dL (ref 3.8–4.9)
BUN/Creatinine Ratio: 12 (ref 9–20)
BUN: 14 mg/dL (ref 6–24)
Bilirubin Total: 0.8 mg/dL (ref 0.0–1.2)
Globulin, Total: 2.5 g/dL (ref 1.5–4.5)
Total Protein: 7.2 g/dL (ref 6.0–8.5)

## 2023-05-28 LAB — MICROSCOPIC EXAMINATION
Bacteria, UA: NONE SEEN
Casts: NONE SEEN /LPF
Epithelial Cells (non renal): NONE SEEN /HPF (ref 0–10)
RBC, Urine: NONE SEEN /HPF (ref 0–2)
WBC, UA: NONE SEEN /HPF (ref 0–5)

## 2023-05-28 LAB — URINE CULTURE, REFLEX: Organism ID, Bacteria: NO GROWTH

## 2023-05-29 ENCOUNTER — Other Ambulatory Visit: Payer: Self-pay

## 2023-05-29 ENCOUNTER — Other Ambulatory Visit (HOSPITAL_BASED_OUTPATIENT_CLINIC_OR_DEPARTMENT_OTHER): Payer: Self-pay

## 2023-05-29 ENCOUNTER — Ambulatory Visit

## 2023-05-29 DIAGNOSIS — N5082 Scrotal pain: Secondary | ICD-10-CM

## 2023-06-08 ENCOUNTER — Encounter (INDEPENDENT_AMBULATORY_CARE_PROVIDER_SITE_OTHER): Payer: Self-pay | Admitting: Sports Medicine

## 2023-06-08 ENCOUNTER — Ambulatory Visit: Admitting: Family Medicine

## 2023-06-08 ENCOUNTER — Other Ambulatory Visit (HOSPITAL_BASED_OUTPATIENT_CLINIC_OR_DEPARTMENT_OTHER): Payer: Self-pay

## 2023-06-08 DIAGNOSIS — N5082 Scrotal pain: Secondary | ICD-10-CM

## 2023-06-08 MED ORDER — METRONIDAZOLE 500 MG PO TABS
500.0000 mg | ORAL_TABLET | Freq: Two times a day (BID) | ORAL | 0 refills | Status: AC
  Filled 2023-06-08: qty 14, 7d supply, fill #0

## 2023-06-08 MED ORDER — DOXYCYCLINE HYCLATE 100 MG PO TABS
100.0000 mg | ORAL_TABLET | Freq: Two times a day (BID) | ORAL | 0 refills | Status: AC
  Filled 2023-06-08: qty 14, 7d supply, fill #0

## 2023-06-08 MED ORDER — CIPROFLOXACIN HCL 750 MG PO TABS
750.0000 mg | ORAL_TABLET | Freq: Two times a day (BID) | ORAL | 0 refills | Status: AC
  Filled 2023-06-08: qty 14, 7d supply, fill #0

## 2023-06-08 NOTE — Telephone Encounter (Signed)

## 2023-06-12 ENCOUNTER — Other Ambulatory Visit (HOSPITAL_BASED_OUTPATIENT_CLINIC_OR_DEPARTMENT_OTHER): Payer: Self-pay

## 2023-06-13 ENCOUNTER — Other Ambulatory Visit: Payer: Self-pay

## 2023-06-13 ENCOUNTER — Other Ambulatory Visit (HOSPITAL_BASED_OUTPATIENT_CLINIC_OR_DEPARTMENT_OTHER): Payer: Self-pay

## 2023-06-13 ENCOUNTER — Other Ambulatory Visit (HOSPITAL_COMMUNITY): Payer: Self-pay

## 2023-06-13 MED ORDER — FREESTYLE LIBRE 3 PLUS SENSOR MISC
1.0000 | 11 refills | Status: AC
  Filled 2023-06-13: qty 2, 30d supply, fill #0

## 2023-06-16 ENCOUNTER — Ambulatory Visit: Admitting: Sports Medicine

## 2023-06-16 ENCOUNTER — Encounter: Payer: Self-pay | Admitting: Sports Medicine

## 2023-06-16 VITALS — BP 119/67 | HR 75 | Temp 97.3°F | Resp 20 | Ht 72.0 in | Wt 234.2 lb

## 2023-06-16 DIAGNOSIS — N5082 Scrotal pain: Secondary | ICD-10-CM | POA: Diagnosis not present

## 2023-06-16 NOTE — Assessment & Plan Note (Signed)
 [name] returns, he is a pleasant 58 year old male with uncontrolled type 1 diabetes currently managed by endocrinology, I saw him back on the seventh of this month with couple of weeks of increasing pain right hemiscrotum. He has had intermittent scrotal pain in the past and has been told he had epididymitis. At the time he had no fevers, chills, trauma, no burning or pain with urination, no penile discharge. He did have some scrotal erythema, he had tenderness particular in the lateral aspect of the right testicle but not so much over the top. He did have palpable bilateral varicoceles. Differential is broad, included epididymitis, varicocele, as well as considering his diabetes and erythema Fournier's gangrene. We aggressively worked him up with a CBC with differential, urinalysis, we had a double coverage antibiotics and at scrotal ultrasound. There is no leukocytosis on his labs, scrotal ultrasound showed bilateral varicoceles. After 2 weeks of antibiotics his symptoms have improved considerably. On exam today he really does not have any right hemiscrotal erythema, he still has some tenderness associated more with his palpable varicoceles. I think he is in the clear, I would like urology to weigh in, I have also suggested scrotal support and avoidance of lifting over 10 to 15 pounds. He has been encouraged to work hard with his endocrinologist on control of his diabetes and I can see him back on an as-needed basis.

## 2023-06-16 NOTE — Progress Notes (Signed)
    Procedures performed today:    None.  Independent interpretation of notes and tests performed by another provider:   None.  Brief History, Exam, Impression, and Recommendations:    Scrotal pain [name] returns, he is a pleasant 58 year old male with uncontrolled type 1 diabetes currently managed by endocrinology, I saw him back on the seventh of this month with couple of weeks of increasing pain right hemiscrotum. He has had intermittent scrotal pain in the past and has been told he had epididymitis. At the time he had no fevers, chills, trauma, no burning or pain with urination, no penile discharge. He did have some scrotal erythema, he had tenderness particular in the lateral aspect of the right testicle but not so much over the top. He did have palpable bilateral varicoceles. Differential is broad, included epididymitis, varicocele, as well as considering his diabetes and erythema Fournier's gangrene. We aggressively worked him up with a CBC with differential, urinalysis, we had a double coverage antibiotics and at scrotal ultrasound. There is no leukocytosis on his labs, scrotal ultrasound showed bilateral varicoceles. After 2 weeks of antibiotics his symptoms have improved considerably. On exam today he really does not have any right hemiscrotal erythema, he still has some tenderness associated more with his palpable varicoceles. I think he is in the clear, I would like urology to weigh in, I have also suggested scrotal support and avoidance of lifting over 10 to 15 pounds. He has been encouraged to work hard with his endocrinologist on control of his diabetes and I can see him back on an as-needed basis.    ____________________________________________ Ihor Austin. Benjamin Stain, M.D., ABFM., CAQSM., AME. Primary Care and Sports Medicine [facility]  Adjunct Professor of Family Medicine  [place] [employer]  Stage manager

## 2023-06-19 ENCOUNTER — Encounter: Payer: Self-pay | Admitting: Urology

## 2023-06-19 ENCOUNTER — Ambulatory Visit: Admitting: Urology

## 2023-06-19 VITALS — BP 127/79 | HR 64 | Ht 72.0 in | Wt 234.0 lb

## 2023-06-19 DIAGNOSIS — I861 Scrotal varices: Secondary | ICD-10-CM

## 2023-06-19 DIAGNOSIS — N5082 Scrotal pain: Secondary | ICD-10-CM

## 2023-06-19 LAB — URINALYSIS, ROUTINE W REFLEX MICROSCOPIC
Bilirubin, UA: NEGATIVE
Glucose, UA: NEGATIVE
Ketones, UA: NEGATIVE
Leukocytes,UA: NEGATIVE
Nitrite, UA: NEGATIVE
Protein,UA: NEGATIVE
RBC, UA: NEGATIVE
Specific Gravity, UA: 1.015 (ref 1.005–1.030)
Urobilinogen, Ur: 0.2 mg/dL (ref 0.2–1.0)
pH, UA: 6 (ref 5.0–7.5)

## 2023-06-19 NOTE — Progress Notes (Signed)
 Assessment: 1. Scrotal pain   2. Varicocele     Plan: I personally reviewed the patient's chart including provider notes, lab and imaging results. I personally reviewed the scrotal ultrasound from 05/29/2023. Diagnosis of varicoceles discussed with the patient.  I advised him that these are completely benign and typically do not require any treatment as they are rarely a cause of scrotal pain. His scrotal pain has resolved with antibiotics and anti-inflammatories which would suggest a diagnosis of epididymitis as the underlying cause of his increased symptoms. Recommend completing his current course of meloxicam. Return to office as needed.   Chief Complaint:  Chief Complaint  Patient presents with   Testicle Pain    History of Present Illness:  Nathaniel [name] is a 58 y.o. male who is seen in consultation from Darlin Priestly, MD for evaluation of right scrotal pain. He reports fairly constant right scrotal pain for approximately 5 years.  He was recently evaluated in early March 2025 for an increase in his symptoms.  On exam he was noted to have some scrotal erythema and tenderness over the epididymis.  He was treated with antibiotics (Cipro, doxycycline) x 3 weeks and started on meloxicam. He reports that the scrotal pain has resolved.  He continues on meloxicam.  Scrotal ultrasound from 05/29/2023 showed mild heterogeneity of the right testicle without mass, normal epididymides bilaterally and bilateral varicoceles.   Past Medical History:  Past Medical History:  Diagnosis Date   Biceps tendinitis, left, distal 11/17/2020   Cervical radiculitis 01/25/2015   Chronic headaches 09/28/2011   Closed avulsion fracture of left ankle 02/24/2020   COVID-19 08/11/2020   Depression    Diabetes mellitus    type II   Fatigue 12/16/2008   Qualifier: Diagnosis of  By: Nena Jordan    GERD (gastroesophageal reflux disease)    Headache(784.0)    Hyperlipidemia 08/11/2020    Hypogonadism male    Neck pain 02/27/2013   Obstructive sleep apnea 06/09/2016   Unattended Home Sleep Test 07/05/16-AHI 14.7/hour, desaturation to 79%, body weight 189 pounds.  AHI around 2.5 usually 04/23/18    Pain in right knee 08/07/2018   Presence of insulin pump (external) (internal) 06/16/2021   Seasonal and perennial allergic rhinitis 09/28/2011   Sleep apnea    CPAP   Type 1 diabetes mellitus, uncontrolled 10/02/2015    Past Surgical History:  Past Surgical History:  Procedure Laterality Date   NASAL SINUS SURGERY  04/1991   TONSILLECTOMY  1972    Allergies:  Allergies  Allergen Reactions   Cymbalta [Duloxetine Hcl] Other (See Comments)    Suicidal thoughts   Influenza Vaccines Other (See Comments)    Unknown reaction   Penicillins Other (See Comments)    Unknown - childhood allergy per mother   Tobacco Swelling    Arm swelled up with allergy testing    Ace Inhibitors Swelling    Angioedema with lisinopril   Iodinated Contrast Media Hives and Itching   Pioglitazone Other (See Comments)      headache   Promethazine Hcl Nausea And Vomiting        Reglan [Metoclopramide] Other (See Comments)    Jittery, increased anxiety    Family History:  Family History  Adopted: Yes  Problem Relation Age of Onset   Cancer Father 33       Prostate cancer   Diabetes Mellitus I Son     Social History:  Social History   Tobacco Use  Smoking status: Never    Passive exposure: Never   Smokeless tobacco: Never  Vaping Use   Vaping status: Never Used  Substance Use Topics   Alcohol use: No   Drug use: No    Review of symptoms:  Constitutional:  Negative for unexplained weight loss, night sweats, fever, chills ENT:  Negative for nose bleeds, sinus pain, painful swallowing CV:  Negative for chest pain, shortness of breath, exercise intolerance, palpitations, loss of consciousness Resp:  Negative for cough, wheezing, shortness of breath GI:  Negative for nausea,  vomiting, diarrhea, bloody stools GU:  Positives noted in HPI; otherwise negative for gross hematuria, dysuria, urinary incontinence Neuro:  Negative for seizures, poor balance, limb weakness, slurred speech Psych:  Negative for lack of energy, depression, anxiety Endocrine:  Negative for polydipsia, polyuria, symptoms of hypoglycemia (dizziness, hunger, sweating) Hematologic:  Negative for anemia, purpura, petechia, prolonged or excessive bleeding, use of anticoagulants  Allergic:  Negative for difficulty breathing or choking as a result of exposure to anything; no shellfish allergy; no allergic response (rash/itch) to materials, foods  Physical exam: BP 127/79   Pulse 64   Ht 6' (1.829 m)   Wt 234 lb (106.1 kg)   BMI 31.74 kg/m  GENERAL APPEARANCE:  Well appearing, well developed, well nourished, NAD HEENT: Atraumatic, Normocephalic, oropharynx clear. NECK: Supple without lymphadenopathy or thyromegaly. LUNGS: Clear to auscultation bilaterally. HEART: Regular Rate and Rhythm without murmurs, gallops, or rubs. ABDOMEN: Soft, non-tender, No Masses. EXTREMITIES: Moves all extremities well.  Without clubbing, cyanosis, or edema. NEUROLOGIC:  Alert and oriented x 3, normal gait, CN II-XII grossly intact.  MENTAL STATUS:  Appropriate. BACK:  Non-tender to palpation.  No CVAT SKIN:  Warm, dry and intact.   GU: Penis:  circumcised Meatus: Normal Scrotum: No erythema or edema; small varicoceles bilaterally Testis: normal without masses bilateral Epididymis: normal   Results: U/A: Negative

## 2023-06-29 ENCOUNTER — Other Ambulatory Visit (HOSPITAL_BASED_OUTPATIENT_CLINIC_OR_DEPARTMENT_OTHER): Payer: Self-pay

## 2023-07-12 ENCOUNTER — Other Ambulatory Visit (HOSPITAL_BASED_OUTPATIENT_CLINIC_OR_DEPARTMENT_OTHER): Payer: Self-pay

## 2023-07-13 ENCOUNTER — Other Ambulatory Visit (HOSPITAL_BASED_OUTPATIENT_CLINIC_OR_DEPARTMENT_OTHER): Payer: Self-pay

## 2023-07-13 MED ORDER — LYUMJEV 100 UNIT/ML IJ SOLN
80.0000 [IU] | Freq: Every day | INTRAMUSCULAR | 5 refills | Status: DC
Start: 2023-07-13 — End: 2024-02-02
  Filled 2023-07-13: qty 30, 37d supply, fill #0
  Filled 2023-08-11: qty 30, 37d supply, fill #1
  Filled 2023-09-26: qty 30, 37d supply, fill #2
  Filled 2023-11-05: qty 30, 37d supply, fill #3
  Filled 2023-12-05: qty 30, 37d supply, fill #4
  Filled 2024-01-01: qty 30, 37d supply, fill #5

## 2023-07-28 ENCOUNTER — Other Ambulatory Visit (HOSPITAL_BASED_OUTPATIENT_CLINIC_OR_DEPARTMENT_OTHER): Payer: Self-pay

## 2023-07-28 DIAGNOSIS — E1065 Type 1 diabetes mellitus with hyperglycemia: Secondary | ICD-10-CM | POA: Diagnosis not present

## 2023-07-28 DIAGNOSIS — E785 Hyperlipidemia, unspecified: Secondary | ICD-10-CM | POA: Diagnosis not present

## 2023-07-28 DIAGNOSIS — I10 Essential (primary) hypertension: Secondary | ICD-10-CM | POA: Diagnosis not present

## 2023-07-28 DIAGNOSIS — Z9641 Presence of insulin pump (external) (internal): Secondary | ICD-10-CM | POA: Diagnosis not present

## 2023-07-28 MED ORDER — INSULIN LISPRO (1 UNIT DIAL) 100 UNIT/ML (KWIKPEN)
PEN_INJECTOR | SUBCUTANEOUS | 5 refills | Status: DC
Start: 2023-07-28 — End: 2024-01-30
  Filled 2023-07-28: qty 15, 30d supply, fill #0

## 2023-07-28 MED ORDER — LANTUS SOLOSTAR 100 UNIT/ML ~~LOC~~ SOPN
PEN_INJECTOR | SUBCUTANEOUS | 5 refills | Status: DC
Start: 2023-07-28 — End: 2023-11-21
  Filled 2023-07-28: qty 15, 30d supply, fill #0

## 2023-08-07 ENCOUNTER — Other Ambulatory Visit (HOSPITAL_BASED_OUTPATIENT_CLINIC_OR_DEPARTMENT_OTHER): Payer: Self-pay

## 2023-08-07 ENCOUNTER — Other Ambulatory Visit: Payer: Self-pay | Admitting: Family Medicine

## 2023-08-07 DIAGNOSIS — R0989 Other specified symptoms and signs involving the circulatory and respiratory systems: Secondary | ICD-10-CM

## 2023-08-07 MED ORDER — ESOMEPRAZOLE MAGNESIUM 40 MG PO CPDR
40.0000 mg | DELAYED_RELEASE_CAPSULE | Freq: Every day | ORAL | 0 refills | Status: DC
  Filled 2023-08-07: qty 90, 90d supply, fill #0

## 2023-08-21 ENCOUNTER — Other Ambulatory Visit (HOSPITAL_BASED_OUTPATIENT_CLINIC_OR_DEPARTMENT_OTHER): Payer: Self-pay

## 2023-08-22 ENCOUNTER — Other Ambulatory Visit (HOSPITAL_BASED_OUTPATIENT_CLINIC_OR_DEPARTMENT_OTHER): Payer: Self-pay

## 2023-08-22 MED ORDER — METFORMIN HCL ER 500 MG PO TB24
1000.0000 mg | ORAL_TABLET | Freq: Two times a day (BID) | ORAL | 5 refills | Status: DC
  Filled 2023-08-22: qty 120, 30d supply, fill #0

## 2023-08-23 ENCOUNTER — Other Ambulatory Visit (HOSPITAL_BASED_OUTPATIENT_CLINIC_OR_DEPARTMENT_OTHER): Payer: Self-pay

## 2023-08-28 DIAGNOSIS — E1065 Type 1 diabetes mellitus with hyperglycemia: Secondary | ICD-10-CM | POA: Diagnosis not present

## 2023-08-28 DIAGNOSIS — E785 Hyperlipidemia, unspecified: Secondary | ICD-10-CM | POA: Diagnosis not present

## 2023-08-31 DIAGNOSIS — E785 Hyperlipidemia, unspecified: Secondary | ICD-10-CM | POA: Diagnosis not present

## 2023-08-31 DIAGNOSIS — I10 Essential (primary) hypertension: Secondary | ICD-10-CM | POA: Diagnosis not present

## 2023-08-31 DIAGNOSIS — E1065 Type 1 diabetes mellitus with hyperglycemia: Secondary | ICD-10-CM | POA: Diagnosis not present

## 2023-09-13 ENCOUNTER — Telehealth: Admitting: Physician Assistant

## 2023-09-13 DIAGNOSIS — R21 Rash and other nonspecific skin eruption: Secondary | ICD-10-CM

## 2023-09-13 MED ORDER — CEPHALEXIN 500 MG PO CAPS
500.0000 mg | ORAL_CAPSULE | Freq: Four times a day (QID) | ORAL | 0 refills | Status: AC
Start: 2023-09-13 — End: 2023-09-20

## 2023-09-13 MED ORDER — CLOTRIMAZOLE 1 % EX LOTN
1.0000 g | TOPICAL_LOTION | Freq: Two times a day (BID) | CUTANEOUS | 0 refills | Status: AC
Start: 2023-09-13 — End: 2023-09-27

## 2023-09-13 NOTE — Progress Notes (Signed)
 E-Visit for [employer]  We are sorry that you are not feeling well. Here is how we plan to help!  Based on what you shared with me it looks like you have tinea cruris, or "Jock Itch".  The symptoms of Jock Itch include red, peeling, itchy rash that affects the groin (crease where the leg meets the trunk).  This fungal infection can be spread through shared towels, clothing, bedding, or hard surfaces (particularly in moist areas) such as shower stalls, locker room floors, or pool area that has the fungus present. If you have a fungal infection on one part of your body, you can also spread it to other parts. For instance, men with a fungal infection on their feet sometimes spread it to their groin.  I am recommending:Clotrimazole  1% cream or gel, apply to area twice per day   I am also sending a prescription for keflex  which is an antibiotic to treat a possible skin infection.   IT IS IMPERATIVE THAT IF YOU DO NOT SEE ANY IMPROVEMENT IN THE NEXT WTHIN THE NEXT 48 HOURS AFTER STARTING MEDICATION OR IF YOU HAVE ANY NEW OR WORSENING SYMPTOMS THAT YOU WILL NEED TO BE SEEN FOR AN IN PERSON VISIT.   HOME CARE:  Keep affected area clean, dry, and cool. Wash with soap and shampoo after sports or exercise and dry yourself well after bathing or swimming Wear cotton underwear and change them if they become damp or sweaty. Avoid using swimming pools, public showers, or baths.  GET HELP RIGHT AWAY IF:  Symptoms that don't away after treatment. Severe itching that persists. If your rash spreads or swells. If your rash begins to have drainage or smell. You develop a fever.  MAKE SURE YOU   Understand these instructions. Will watch your condition. Will get help right away if you are not doing well or get worse.  Thank you for choosing an e-visit.  Your e-visit answers were reviewed by a board certified advanced clinical practitioner to complete your personal care plan. Depending upon the condition,  your plan could have included both over the counter or prescription medications.  Please review your pharmacy choice. Make sure the pharmacy is open so you can pick up prescription now. If there is a problem, you may contact your provider through [employer] and have the prescription routed to another pharmacy.  Your safety is important to us . If you have drug allergies check your prescription carefully.   For the next 24 hours you can use MyChart to ask questions about today's visit, request a non-urgent call back, or ask for a work or school excuse. You will get an email in the next two days asking about your experience. I hope that your e-visit has been valuable and will speed your recovery.   References or for more information:  [email or web address]=jock%[email or web address]&source=search_result&selectedTitle=3~52&usage_type=default&display_rank=3  Approximately 5 minutes was spent documenting and reviewing patient's chart.

## 2023-09-18 ENCOUNTER — Other Ambulatory Visit (HOSPITAL_BASED_OUTPATIENT_CLINIC_OR_DEPARTMENT_OTHER): Payer: Self-pay

## 2023-09-19 ENCOUNTER — Other Ambulatory Visit: Payer: Self-pay

## 2023-09-26 ENCOUNTER — Other Ambulatory Visit (HOSPITAL_BASED_OUTPATIENT_CLINIC_OR_DEPARTMENT_OTHER): Payer: Self-pay

## 2023-10-09 ENCOUNTER — Other Ambulatory Visit: Payer: Self-pay

## 2023-10-09 ENCOUNTER — Ambulatory Visit: Admitting: Family Medicine

## 2023-10-09 VITALS — BP 108/68 | HR 84 | Ht 72.0 in | Wt 235.0 lb

## 2023-10-09 DIAGNOSIS — Z794 Long term (current) use of insulin: Secondary | ICD-10-CM | POA: Insufficient documentation

## 2023-10-09 DIAGNOSIS — R0609 Other forms of dyspnea: Secondary | ICD-10-CM | POA: Diagnosis not present

## 2023-10-09 DIAGNOSIS — G43709 Chronic migraine without aura, not intractable, without status migrainosus: Secondary | ICD-10-CM | POA: Insufficient documentation

## 2023-10-09 DIAGNOSIS — R002 Palpitations: Secondary | ICD-10-CM

## 2023-10-09 DIAGNOSIS — I10 Essential (primary) hypertension: Secondary | ICD-10-CM | POA: Insufficient documentation

## 2023-10-09 DIAGNOSIS — E1065 Type 1 diabetes mellitus with hyperglycemia: Secondary | ICD-10-CM | POA: Insufficient documentation

## 2023-10-09 NOTE — Assessment & Plan Note (Signed)
 Symptoms are concerning for anginal equivalent especially with cardiac risk factors.  Referral placed to cardiology.  Red flags reviewed.  Checking labs today including CBC and TSH.  Echocardiogram ordered.

## 2023-10-09 NOTE — Progress Notes (Signed)
 Nathaniel [name] - 58 y.o. male MRN [id number]  Date of birth: 09-11-1965  Subjective Chief Complaint  Patient presents with   Elevated heart rate   Near Syncope   Shortness of Breath    HPI Nathaniel [name] is a 58 y.o. male here today with complaint of dyspnea and elevation in heart rate.  Reports exertional dyspnea with elevation in heart rate with palpitations.  Notices symptoms when exercising or climbing stairs.  Dyspnea improves at rest.  Has not really noticed any chest pain or tightness associated with this.  He does have risk factors for cardiovascular disease including hypertension and diabetes.    ROS:  A comprehensive ROS was completed and negative except as noted per HPI  Allergies  Allergen Reactions   Cymbalta [Duloxetine Hcl] Other (See Comments)    Suicidal thoughts   Influenza Vaccines Other (See Comments)    Unknown reaction   Penicillins Other (See Comments)    Unknown - childhood allergy  per mother   Tobacco Swelling    Arm swelled up with allergy  testing    Ace Inhibitors Swelling    Angioedema with lisinopril    Iodinated Contrast Media Hives and Itching   Pioglitazone Other (See Comments)      headache   Promethazine Hcl Nausea And Vomiting        Reglan [Metoclopramide] Other (See Comments)    Jittery, increased anxiety    Past Medical History:  Diagnosis Date   Biceps tendinitis, left, distal 11/17/2020   Cervical radiculitis 01/25/2015   Chronic headaches 09/28/2011   Closed avulsion fracture of left ankle 02/24/2020   COVID-19 08/11/2020   Depression    Diabetes mellitus    type II   Fatigue 12/16/2008   Qualifier: Diagnosis of  By: Georgian ROSALEA CHARM Lamar    GERD (gastroesophageal reflux disease)    Headache(784.0)    Hyperlipidemia 08/11/2020   Hypogonadism male    Neck pain 02/27/2013   Obstructive sleep apnea 06/09/2016   Unattended Home Sleep Test 07/05/16-AHI 14.7/hour, desaturation to 79%, body weight 189 pounds.  AHI around 2.5 usually  04/23/18    Pain in right knee 08/07/2018   Presence of insulin  pump (external) (internal) 06/16/2021   Seasonal and perennial allergic rhinitis 09/28/2011   Sleep apnea    CPAP   Type 1 diabetes mellitus, uncontrolled 10/02/2015    Past Surgical History:  Procedure Laterality Date   NASAL SINUS SURGERY  04/1991   TONSILLECTOMY  1972    Social History   Socioeconomic History   Marital status: Married    Spouse name: Programmer, applications   Number of children: 2   Years of education: Not on file   Highest education level: Master's degree (e.g., MA, MS, MEng, MEd, MSW, MBA)  Occupational History    Employer: [employer]    Comment: Mental Health Case Management  Tobacco Use   Smoking status: Never    Passive exposure: Never   Smokeless tobacco: Never  Vaping Use   Vaping status: Never Used  Substance and Sexual Activity   Alcohol  use: No   Drug use: No   Sexual activity: Not on file  Other Topics Concern   Not on file  Social History Narrative   He lives with wife and two children.   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   Highest level of education: master degree   Caffeine- diet soda 2 L a day   Social Drivers of Health  Financial Resource Strain: Low Risk  (10/09/2023)   Overall Financial Resource Strain (CARDIA)    Difficulty of Paying Living Expenses: Not hard at all  Food Insecurity: No Food Insecurity (10/09/2023)   Hunger Vital Sign    Worried About Running Out of Food in the Last Year: Never true    Ran Out of Food in the Last Year: Never true  Transportation Needs: No Transportation Needs (10/09/2023)   PRAPARE - Administrator, Civil Service (Medical): No    Lack of Transportation (Non-Medical): No  Physical Activity: Inactive (10/09/2023)   Exercise Vital Sign    Days of Exercise per Week: 0 days    Minutes of Exercise per Session: Not on file  Stress: Stress Concern Present (10/09/2023)   [employer] of  Occupational Health - Occupational Stress Questionnaire    Feeling of Stress: To some extent  Social Connections: Moderately Isolated (10/09/2023)   Social Connection and Isolation Panel    Frequency of Communication with Friends and Family: Once a week    Frequency of Social Gatherings with Friends and Family: Never    Attends Religious Services: More than 4 times per year    Active Member of Clubs or Organizations: No    Attends Engineer, structural: Not on file    Marital Status: Married    Family History  Adopted: Yes  Problem Relation Age of Onset   Cancer Father 38       Prostate cancer   Diabetes Mellitus I Son     Health Maintenance  Topic Date Due   HIV Screening  Never done   Hepatitis C Screening  Never done   Hepatitis B Vaccines (1 of 3 - 19+ 3-dose series) Never done   Zoster Vaccines- Shingrix (1 of 2) Never done   Pneumococcal Vaccine 61-48 Years old (2 of 2 - PCV) 02/20/2018   DTaP/Tdap/Td (2 - Td or Tdap) 03/01/2021   COVID-19 Vaccine (4 - 2024-25 season) 11/20/2022   HEMOGLOBIN A1C  01/21/2023   Diabetic kidney evaluation - Urine ACR  04/01/2023   Fecal DNA (Cologuard)  05/21/2023   FOOT EXAM  06/16/2023   INFLUENZA VACCINE  10/20/2023   OPHTHALMOLOGY EXAM  02/24/2024   Diabetic kidney evaluation - eGFR measurement  05/25/2024   HPV VACCINES  Aged Out   Meningococcal B Vaccine  Aged Out     ----------------------------------------------------------------------------------------------------------------------------------------------------------------------------------------------------------------- Physical Exam BP 108/68 (BP Location: Left Arm, Patient Position: Sitting, Cuff Size: Large)   Pulse 84   Ht 6' (1.829 m)   Wt 235 lb (106.6 kg)   SpO2 97%   BMI 31.87 kg/m   Physical Exam Constitutional:      Appearance: He is well-developed.  HENT:     Head: Normocephalic and atraumatic.  Eyes:     General: No scleral  icterus. Cardiovascular:     Rate and Rhythm: Normal rate and regular rhythm.  Pulmonary:     Effort: Pulmonary effort is normal.     Breath sounds: Normal breath sounds.  Neurological:     General: No focal deficit present.     Mental Status: He is alert.  Psychiatric:        Mood and Affect: Mood normal.        Behavior: Behavior normal.   EKG: NSR.  No significant change from previous tracing.  ------------------------------------------------------------------------------------------------------------------------------------------------------------------------------------------------------------------- Assessment and Plan  Exertional dyspnea Symptoms are concerning for anginal equivalent especially with cardiac risk factors.  Referral placed to cardiology.  Red flags reviewed.  Checking labs today including CBC and TSH.  Echocardiogram ordered.   No orders of the defined types were placed in this encounter.   No follow-ups on file.

## 2023-10-10 ENCOUNTER — Encounter: Payer: Self-pay | Admitting: Family Medicine

## 2023-10-10 LAB — CBC WITH DIFFERENTIAL/PLATELET
Basophils Absolute: 0.1 x10E3/uL (ref 0.0–0.2)
Basos: 1 %
EOS (ABSOLUTE): 0.1 x10E3/uL (ref 0.0–0.4)
Eos: 2 %
Hematocrit: 43.1 % (ref 37.5–51.0)
Hemoglobin: 13.6 g/dL (ref 13.0–17.7)
Immature Grans (Abs): 0.1 x10E3/uL (ref 0.0–0.1)
Immature Granulocytes: 1 %
Lymphocytes Absolute: 2 x10E3/uL (ref 0.7–3.1)
Lymphs: 23 %
MCH: 27.7 pg (ref 26.6–33.0)
MCHC: 31.6 g/dL (ref 31.5–35.7)
MCV: 88 fL (ref 79–97)
Monocytes Absolute: 0.7 x10E3/uL (ref 0.1–0.9)
Monocytes: 8 %
Neutrophils Absolute: 5.7 x10E3/uL (ref 1.4–7.0)
Neutrophils: 65 %
Platelets: 239 x10E3/uL (ref 150–450)
RBC: 4.91 x10E6/uL (ref 4.14–5.80)
RDW: 12.3 % (ref 11.6–15.4)
WBC: 8.7 x10E3/uL (ref 3.4–10.8)

## 2023-10-10 LAB — CMP14+EGFR
ALT: 26 IU/L (ref 0–44)
AST: 27 IU/L (ref 0–40)
Albumin: 4.6 g/dL (ref 3.8–4.9)
Alkaline Phosphatase: 89 IU/L (ref 44–121)
BUN/Creatinine Ratio: 12 (ref 9–20)
BUN: 13 mg/dL (ref 6–24)
Bilirubin Total: 0.7 mg/dL (ref 0.0–1.2)
CO2: 17 mmol/L — ABNORMAL LOW (ref 20–29)
Calcium: 9.5 mg/dL (ref 8.7–10.2)
Chloride: 100 mmol/L (ref 96–106)
Creatinine, Ser: 1.08 mg/dL (ref 0.76–1.27)
Globulin, Total: 2.4 g/dL (ref 1.5–4.5)
Glucose: 127 mg/dL — ABNORMAL HIGH (ref 70–99)
Potassium: 4.4 mmol/L (ref 3.5–5.2)
Sodium: 137 mmol/L (ref 134–144)
Total Protein: 7 g/dL (ref 6.0–8.5)
eGFR: 80 mL/min/1.73 (ref >59)

## 2023-10-10 LAB — TSH: TSH: 1.21 u[IU]/mL (ref 0.450–4.500)

## 2023-10-11 ENCOUNTER — Other Ambulatory Visit (HOSPITAL_BASED_OUTPATIENT_CLINIC_OR_DEPARTMENT_OTHER): Payer: Self-pay

## 2023-10-17 ENCOUNTER — Other Ambulatory Visit (HOSPITAL_BASED_OUTPATIENT_CLINIC_OR_DEPARTMENT_OTHER): Payer: Self-pay

## 2023-10-19 ENCOUNTER — Ambulatory Visit (HOSPITAL_BASED_OUTPATIENT_CLINIC_OR_DEPARTMENT_OTHER)
Admission: RE | Admit: 2023-10-19 | Discharge: 2023-10-19 | Disposition: A | Discharge status: Home / Self Care | Source: Ambulatory Visit | Attending: Family Medicine | Admitting: Family Medicine

## 2023-10-19 ENCOUNTER — Ambulatory Visit: Payer: Self-pay | Admitting: Family Medicine

## 2023-10-19 DIAGNOSIS — G4730 Sleep apnea, unspecified: Secondary | ICD-10-CM | POA: Insufficient documentation

## 2023-10-19 DIAGNOSIS — E119 Type 2 diabetes mellitus without complications: Secondary | ICD-10-CM | POA: Insufficient documentation

## 2023-10-19 DIAGNOSIS — E785 Hyperlipidemia, unspecified: Secondary | ICD-10-CM | POA: Diagnosis not present

## 2023-10-19 DIAGNOSIS — I10 Essential (primary) hypertension: Secondary | ICD-10-CM | POA: Insufficient documentation

## 2023-10-19 DIAGNOSIS — R002 Palpitations: Secondary | ICD-10-CM | POA: Insufficient documentation

## 2023-10-19 DIAGNOSIS — R0609 Other forms of dyspnea: Secondary | ICD-10-CM | POA: Insufficient documentation

## 2023-10-19 LAB — ECHOCARDIOGRAM COMPLETE
AR max vel: 2.45 cm2
AV Area VTI: 2.61 cm2
AV Area mean vel: 2.69 cm2
AV Mean grad: 4 mmHg
AV Peak grad: 8.9 mmHg
Ao pk vel: 1.49 m/s
Area-P 1/2: 3.08 cm2
Calc EF: 61 %
S' Lateral: 2.8 cm
Single Plane A2C EF: 59.9 %
Single Plane A4C EF: 64.8 %

## 2023-10-19 NOTE — Progress Notes (Signed)
 Erskin Mott, covering for Dr. Alvia while he is out of the office.  Metabolic panel overall looks okay as well as your blood count.  Thyroid  looks great.

## 2023-10-23 ENCOUNTER — Other Ambulatory Visit (HOSPITAL_BASED_OUTPATIENT_CLINIC_OR_DEPARTMENT_OTHER): Payer: Self-pay

## 2023-10-24 ENCOUNTER — Other Ambulatory Visit (HOSPITAL_BASED_OUTPATIENT_CLINIC_OR_DEPARTMENT_OTHER): Payer: Self-pay

## 2023-10-24 MED ORDER — ROSUVASTATIN CALCIUM 10 MG PO TABS
10.0000 mg | ORAL_TABLET | Freq: Every day | ORAL | 1 refills | Status: DC
  Filled 2023-10-24: qty 90, 90d supply, fill #0
  Filled 2024-01-23: qty 90, 90d supply, fill #1

## 2023-10-29 ENCOUNTER — Telehealth: Admitting: Family

## 2023-10-29 DIAGNOSIS — W57XXXA Bitten or stung by nonvenomous insect and other nonvenomous arthropods, initial encounter: Secondary | ICD-10-CM | POA: Diagnosis not present

## 2023-10-29 DIAGNOSIS — L989 Disorder of the skin and subcutaneous tissue, unspecified: Secondary | ICD-10-CM | POA: Diagnosis not present

## 2023-10-29 MED ORDER — CEPHALEXIN 500 MG PO CAPS: 500.0000 mg | ORAL_CAPSULE | Freq: Three times a day (TID) | ORAL | 0 refills | Status: AC

## 2023-10-29 NOTE — Progress Notes (Signed)
 E-Visit for Insect Sting  Thank you for describing the insect sting for us .  Here is how we plan to help!  Based on the information you have shared with me it looks like you have: a localized reaction. Due to diabetes we will treat with antibiotics.   The 2 greatest risks from insect stings are allergic reaction, which can be fatal in some people and infection, which is more common and less serious.  Bees, wasps, yellow jackets, and hornets belong to a class of insects called Hymenoptera.  Most insect stings cause only minor discomfort.  Stings can happen anywhere on the body and can be painful.  Most stings are from honey bees or yellow jackets.  Fire ants can sting multiple times.  The sites of the stings are more likely to become infected.    I will send   What can be used to prevent Insect Stings?  Insect repellant with at least 20% DEET.  Wearing long pants and shirts with socks and shoes.  Wear dark or drab-colored clothes rather than bright colors.  Avoid using perfumes and hair sprays; these attract insects.  HOME CARE ADVICE:  1. Stinger removal: The stinger looks like a tiny black dot in the sting. Use a fingernail, credit card edge, or knife-edge to scrape it off.  Don't pull it out because it squeezes out more venom. If the stinger is below the skin surface, leave it alone.  It will be shed with normal skin healing. 2. Use cold compresses to the area of the sting for 10-20 minutes.  You may repeat this as needed to relieve symptoms of pain and swelling. 3.  For pain relief, take acetominophen 650 mg 4-6 hours as needed or ibuprofen  400 mg every 6-8 hours as needed or naproxen  250-500 mg every 12 hours as needed. 4.  You can also use hydrocortisone cream 0.5% or 1% up to 4 times daily as needed for itching. 5.  If the sting becomes very itchy, take Benadryl  25-50 mg, follow directions on box. 6.  Wash the area 2-3 times daily with antibacterial soap and warm water. 7. Call  your Doctor if: Fever, a severe headache, or rash occur in the next 2 weeks. Sting area begins to look infected. Redness and swelling worsens after home treatment. Your current symptoms become worse.    MAKE SURE YOU:  Understand these instructions. Will watch your condition. Will get help right away if you are not doing well or get worse.  Thank you for choosing an e-visit.  Your e-visit answers were reviewed by a board certified advanced clinical practitioner to complete your personal care plan. Depending upon the condition, your plan could have included both over the counter or prescription medications.  Please review your pharmacy choice. Make sure the pharmacy is open so you can pick up prescription now. If there is a problem, you may contact your provider through [employer] and have the prescription routed to another pharmacy.  Your safety is important to us . If you have drug allergies check your prescription carefully.   For the next 24 hours you can use MyChart to ask questions about today's visit, request a non-urgent call back, or ask for a work or school excuse. You will get an email in the next two days asking about your experience. I hope that your e-visit has been valuable and will speed your recovery.    have provided 5 minutes of non face to face time during this encounter for  chart review and documentation.

## 2023-11-06 ENCOUNTER — Other Ambulatory Visit (HOSPITAL_BASED_OUTPATIENT_CLINIC_OR_DEPARTMENT_OTHER): Payer: Self-pay

## 2023-11-06 ENCOUNTER — Ambulatory Visit
Admission: RE | Admit: 2023-11-06 | Discharge: 2023-11-06 | Disposition: A | Discharge status: Home / Self Care | Attending: Family Medicine | Admitting: Family Medicine

## 2023-11-06 VITALS — BP 134/87 | HR 71 | Temp 98.4°F | Resp 16

## 2023-11-06 DIAGNOSIS — L03116 Cellulitis of left lower limb: Secondary | ICD-10-CM | POA: Diagnosis not present

## 2023-11-06 DIAGNOSIS — W57XXXA Bitten or stung by nonvenomous insect and other nonvenomous arthropods, initial encounter: Secondary | ICD-10-CM

## 2023-11-06 MED ORDER — DOXYCYCLINE HYCLATE 100 MG PO CAPS: 100.0000 mg | ORAL_CAPSULE | Freq: Two times a day (BID) | ORAL | 0 refills | Status: AC

## 2023-11-06 NOTE — ED Triage Notes (Signed)
 Pt reports spider bite on the left ankle that is not healing.  Reports have finished one week of antibiotic and it actually looks worse now than it did last week. Pt is T1DM and concerned about infection. Has tried using antibiotic ointment. Denies any drainage and swelling.

## 2023-11-06 NOTE — ED Provider Notes (Signed)
 [name]    CSN: [id number] Arrival date & time: 11/06/23  1747      History   Chief Complaint Chief Complaint  Patient presents with   Insect Bite          HPI Nathaniel [name] is a 58 y.o. male.   HPI 58 year old male presents with possible spider bite of left ankle and is concerned about possible infection.  PMH significant for cervical radiculitis, chronic headaches, and HLD.  Patient reports was treated by e-visit on 10/29/2023.  Cephalexin  500 mg 3 times daily for 7 days #21 please see epic for that encounter note.  Past Medical History:  Diagnosis Date   Biceps tendinitis, left, distal 11/17/2020   Cervical radiculitis 01/25/2015   Chronic headaches 09/28/2011   Closed avulsion fracture of left ankle 02/24/2020   COVID-19 08/11/2020   Depression    Diabetes mellitus    type II   Fatigue 12/16/2008   Qualifier: Diagnosis of  By: Georgian ROSALEA CHARM Lamar    GERD (gastroesophageal reflux disease)    Headache(784.0)    Hyperlipidemia 08/11/2020   Hypogonadism male    Neck pain 02/27/2013   Obstructive sleep apnea 06/09/2016   Unattended Home Sleep Test 07/05/16-AHI 14.7/hour, desaturation to 79%, body weight 189 pounds.  AHI around 2.5 usually 04/23/18    Pain in right knee 08/07/2018   Presence of insulin  pump (external) (internal) 06/16/2021   Seasonal and perennial allergic rhinitis 09/28/2011   Sleep apnea    CPAP   Type 1 diabetes mellitus, uncontrolled 10/02/2015    Patient Active Problem List   Diagnosis Date Noted   Long term current use of insulin  (HCC) 10/09/2023   Hyperglycemia due to type 1 diabetes mellitus (HCC) 10/09/2023   Essential hypertension 10/09/2023   Chronic migraine without aura, not intractable, without status migrainosus 10/09/2023   Exertional dyspnea 10/09/2023   Scrotal pain 05/26/2023   Primary osteoarthritis of left knee 05/02/2022   Chronic throat clearing 07/20/2021   Presence of insulin  pump (external) (internal)  06/16/2021   Hyperlipidemia 08/11/2020   GERD (gastroesophageal reflux disease) 11/21/2019   Well adult exam 04/30/2019   Obstructive sleep apnea 06/09/2016   Uncontrolled type 1 diabetes mellitus with hyperglycemia (HCC) 10/02/2015   Cervical radiculitis 01/25/2015   Neck pain 02/27/2013   MDD (major depressive disorder) 12/19/2012   Chronic headaches 09/28/2011   Seasonal and perennial allergic rhinitis 09/28/2011   Fatigue 12/16/2008    Past Surgical History:  Procedure Laterality Date   NASAL SINUS SURGERY  04/1991   TONSILLECTOMY  1972       Home Medications    Prior to Admission medications   Medication Sig Start Date End Date Taking? Authorizing Provider  doxycycline  (VIBRAMYCIN ) 100 MG capsule Take 1 capsule (100 mg total) by mouth 2 (two) times daily for 10 days. 11/06/23 11/16/23 Yes Teddy Sharper, FNP  Continuous Glucose Sensor (FREESTYLE LIBRE 3 PLUS SENSOR) MISC Apply 1 sensor every 15 days as directed. 04/25/23     Continuous Glucose Sensor (FREESTYLE LIBRE 3 PLUS SENSOR) MISC Use as directed to check blood sugar and change every 15 days. 06/13/23     Continuous Glucose Sensor (FREESTYLE LIBRE 3 SENSOR) MISC Apply on to the skin every 14 days. Patient not taking: Reported on 06/19/2023 02/16/22   Trixie File, MD  esomeprazole  (NEXIUM ) 40 MG capsule Take 1 capsule (40 mg total) by mouth daily at dinner time. 08/07/23   Alvia Bring, DO  fexofenadine  (ALLEGRA ) 180  MG tablet Take 1 tablet (180 mg total) by mouth daily. Patient not taking: Reported on 06/19/2023 12/20/21   Alvia Bring, DO  FLUoxetine  (PROZAC ) 20 MG capsule Take 1 capsule (20 mg total) by mouth daily. 05/03/23   Alvia Bring, DO  FLUoxetine  (PROZAC ) 20 MG tablet Take 20 mg by mouth daily. Patient not taking: Reported on 06/19/2023    [provider]  glucose blood (FREESTYLE LITE) test strip Check blood sugars 3 (three) times daily. 01/03/23   Roy Harlene HERO, NP  Insulin  Disposable Pump  (OMNIPOD 5 G6 INTRO, GEN 5,) KIT Inject into the skin. Patient not taking: Reported on 06/19/2023 10/05/22   [provider]  insulin  glargine (LANTUS  SOLOSTAR) 100 UNIT/ML Solostar Pen Inject 50 units subcutaneously once daily. 07/28/23     insulin  lispro (HUMALOG  KWIKPEN) 100 UNIT/ML KwikPen Inject 10 units subcutaneously 3 times per day before meals + correctional units, maximum daily dose 50 units per day. 07/28/23     insulin  lispro (HUMALOG  KWIKPEN) 200 UNIT/ML KwikPen Inject under the skin up to 50 units of insulin  daily as advised Patient not taking: Reported on 06/19/2023 02/03/22   Trixie File, MD  insulin  lispro (HUMALOG ) 100 UNIT/ML injection Inject 0.8 mLs (80 Units total) into the skin via pump daily. Patient not taking: Reported on 06/19/2023 04/01/22     insulin  lispro (HUMALOG ) 100 UNIT/ML injection Inject 1 mL (100 Units total) into the skin daily. 04/05/22     insulin  lispro (HUMALOG ) 100 UNIT/ML injection Inject 100 units daily via insulin  pump Patient not taking: Reported on 06/19/2023 11/07/22     insulin  lispro (HUMALOG ) 100 UNIT/ML injection Inject 0.8 mLs (80 Units total) into the skin daily via pump. Patient not taking: Reported on 06/19/2023 11/07/22     Insulin  Lispro-aabc (LYUMJEV ) 100 UNIT/ML SOLN Inject 80 Units as directed via insulin  pump daily. 07/13/23     ipratropium (ATROVENT ) 0.03 % nasal spray Place 2 sprays into both nostrils every 12 (twelve) hours. Patient not taking: Reported on 10/09/2023 12/22/22   Alvia Bring, DO  losartan  (COZAAR ) 25 MG tablet Take 1 tablet (25 mg total) by mouth daily. 05/04/23     metFORMIN  (GLUCOPHAGE -XR) 500 MG 24 hr tablet Take 2 tablets (1,000 mg total) by mouth 2 (two) times daily with meals. Patient not taking: Reported on 06/19/2023 12/06/22     metFORMIN  (GLUCOPHAGE -XR) 500 MG 24 hr tablet Take 4 tablets (2,000 mg total) by mouth daily with breakfast. Patient not taking: Reported on 06/19/2023 12/07/22     metFORMIN   (GLUCOPHAGE -XR) 500 MG 24 hr tablet Take 2 tablets (1,000 mg total) by mouth every evening with a meal. 05/04/23     metFORMIN  (GLUCOPHAGE -XR) 500 MG 24 hr tablet Take 2 tablets (1,000 mg total) by mouth 2 (two) times daily. 08/22/23     rosuvastatin  (CRESTOR ) 10 MG tablet Take 1 tablet (10 mg total) by mouth daily. 10/24/23       Family History Family History  Adopted: Yes  Problem Relation Age of Onset   Cancer Father 25       Prostate cancer   Diabetes Mellitus I Son     Social History Social History   Tobacco Use   Smoking status: Never    Passive exposure: Never   Smokeless tobacco: Never  Vaping Use   Vaping status: Never Used  Substance Use Topics   Alcohol  use: No   Drug use: No     Allergies   Cymbalta [duloxetine hcl], Influenza vaccines,  Penicillins, Tobacco, Ace inhibitors, Iodinated contrast media, Pioglitazone, Promethazine hcl, and Reglan [metoclopramide]   Review of Systems Review of Systems   Physical Exam Triage Vital Signs ED Triage Vitals  Encounter Vitals Group     BP      Girls Systolic BP Percentile      Girls Diastolic BP Percentile      Boys Systolic BP Percentile      Boys Diastolic BP Percentile      Pulse      Resp      Temp      Temp src      SpO2      Weight      Height      Head Circumference      Peak Flow      Pain Score      Pain Loc      Pain Education      Exclude from Growth Chart    No data found.  Updated Vital Signs BP 134/87 (BP Location: Right Arm)   Pulse 71   Temp 98.4 F (36.9 C) (Oral)   Resp 16   SpO2 95%   Visual Acuity Right Eye Distance:   Left Eye Distance:   Bilateral Distance:    Right Eye Near:   Left Eye Near:    Bilateral Near:     Physical Exam Vitals and nursing note reviewed.  Constitutional:      General: He is not in acute distress.    Appearance: Normal appearance. He is obese. He is not ill-appearing.  HENT:     Head: Normocephalic and atraumatic.     Mouth/Throat:      Mouth: Mucous membranes are moist.     Pharynx: Oropharynx is clear.  Eyes:     Extraocular Movements: Extraocular movements intact.     Pupils: Pupils are equal, round, and reactive to light.  Cardiovascular:     Rate and Rhythm: Normal rate and regular rhythm.     Pulses: Normal pulses.     Heart sounds: Normal heart sounds.  Pulmonary:     Effort: Pulmonary effort is normal.     Breath sounds: Normal breath sounds. No wheezing, rhonchi or rales.  Musculoskeletal:        General: Normal range of motion.     Cervical back: Normal range of motion and neck supple.  Skin:    General: Skin is warm and dry.     Comments: Left superior ankle (medial aspect)~3.0 cm x 3.0 cm circular shaped area of erythema erythema-please see image below  Neurological:     General: No focal deficit present.     Mental Status: He is alert and oriented to person, place, and time. Mental status is at baseline.  Psychiatric:        Mood and Affect: Mood normal.        Behavior: Behavior normal.      UC Treatments / Results  Labs (all labs ordered are listed, but only abnormal results are displayed) Labs Reviewed - No data to display  EKG   Radiology No results found.  Procedures Procedures (including critical care time)  Medications Ordered in UC Medications - No data to display  Initial Impression / Assessment and Plan / UC Course  I have reviewed the triage vital signs and the nursing notes.  Pertinent labs & imaging results that were available during my care of the patient were reviewed by me and considered in my medical decision  making (see chart for details).     MDM: 1.  Cellulitis of left ankle-Rx'd doxycycline  100 mg capsule: Take 1 capsule twice daily x 10 days; 2.  Bug bite with infection, initial encounter-Rx doxycycline  100 mg capsule: Take 1 capsule twice daily x 10 days. Advised patient to take medication as directed with food to completion.  Encouraged to increase daily water  intake to 64 ounces per day while taking this medication.  Advised if symptoms worsen and/or unresolved please follow-up with your PCP or here for further evaluation.  Patient discharged home, hemodynamically stable. Final Clinical Impressions(s) / UC Diagnoses   Final diagnoses:  Cellulitis of left ankle  Bug bite with infection, initial encounter     Discharge Instructions      Advised patient to take medication as directed with food to completion.  Encouraged to increase daily water intake to 64 ounces per day while taking this medication.  Advised if symptoms worsen and/or unresolved please follow-up with your PCP or here for further evaluation.     ED Prescriptions     Medication Sig Dispense Auth. Provider   doxycycline  (VIBRAMYCIN ) 100 MG capsule Take 1 capsule (100 mg total) by mouth 2 (two) times daily for 10 days. 20 capsule Panagiota Perfetti, FNP      PDMP not reviewed this encounter.   Teddy Sharper, FNP 11/06/23 PERVIS

## 2023-11-06 NOTE — Discharge Instructions (Addendum)
 Advised patient to take medication as directed with food to completion.  Encouraged to increase daily water intake to 64 ounces per day while taking this medication.  Advised if symptoms worsen and/or unresolved please follow-up with your PCP or here for further evaluation.

## 2023-11-07 ENCOUNTER — Telehealth: Payer: Self-pay

## 2023-11-07 NOTE — Telephone Encounter (Signed)
 Issue resolved by [employer] representative Miquel SAILOR.

## 2023-11-07 NOTE — Telephone Encounter (Signed)
 Copied from CRM [id number]. Topic: General - Other >> Nov 07, 2023  1:59 PM Nathaniel [name] wrote: Reason for CRM: Nat from [employer] calling if Pt is with Dr. Alvia. Confirmed last app for 10/09/23. Provided fax number for office. No further questions

## 2023-11-15 ENCOUNTER — Other Ambulatory Visit: Payer: Self-pay | Admitting: Family Medicine

## 2023-11-15 ENCOUNTER — Other Ambulatory Visit (HOSPITAL_BASED_OUTPATIENT_CLINIC_OR_DEPARTMENT_OTHER): Payer: Self-pay

## 2023-11-15 ENCOUNTER — Other Ambulatory Visit: Payer: Self-pay

## 2023-11-15 DIAGNOSIS — R0989 Other specified symptoms and signs involving the circulatory and respiratory systems: Secondary | ICD-10-CM

## 2023-11-15 MED ORDER — LOSARTAN POTASSIUM 25 MG PO TABS
25.0000 mg | ORAL_TABLET | Freq: Every day | ORAL | 5 refills | Status: DC
  Filled 2023-11-15: qty 30, 30d supply, fill #0
  Filled 2023-12-19: qty 30, 30d supply, fill #1
  Filled 2024-01-22: qty 30, 30d supply, fill #2
  Filled 2024-02-19: qty 30, 30d supply, fill #3
  Filled 2024-03-28: qty 30, 30d supply, fill #4

## 2023-11-15 MED ORDER — ESOMEPRAZOLE MAGNESIUM 40 MG PO CPDR
40.0000 mg | DELAYED_RELEASE_CAPSULE | Freq: Every day | ORAL | 0 refills | Status: DC
  Filled 2023-11-15: qty 90, 90d supply, fill #0

## 2023-11-21 ENCOUNTER — Ambulatory Visit: Admitting: Urgent Care

## 2023-11-21 ENCOUNTER — Other Ambulatory Visit (HOSPITAL_BASED_OUTPATIENT_CLINIC_OR_DEPARTMENT_OTHER): Payer: Self-pay

## 2023-11-21 ENCOUNTER — Encounter: Payer: Self-pay | Admitting: Sports Medicine

## 2023-11-21 ENCOUNTER — Encounter: Payer: Self-pay | Admitting: Urgent Care

## 2023-11-21 VITALS — BP 113/66 | HR 84 | Temp 99.3°F | Ht 72.0 in | Wt 234.0 lb

## 2023-11-21 DIAGNOSIS — U071 COVID-19: Secondary | ICD-10-CM | POA: Diagnosis not present

## 2023-11-21 LAB — POC COVID19/FLU A&B COMBO
Covid Antigen, POC: POSITIVE — AB
Influenza A Antigen, POC: NEGATIVE
Influenza B Antigen, POC: NEGATIVE

## 2023-11-21 MED ORDER — HYDROCOD POLI-CHLORPHE POLI ER 10-8 MG/5ML PO SUER
5.0000 mL | Freq: Every evening | ORAL | 0 refills | Status: DC | PRN
  Filled 2023-11-21: qty 70, 14d supply, fill #0

## 2023-11-21 MED ORDER — NIRMATRELVIR/RITONAVIR (PAXLOVID)TABLET
3.0000 | ORAL_TABLET | Freq: Two times a day (BID) | ORAL | 0 refills | Status: AC
Start: 2023-11-21 — End: 2023-11-26
  Filled 2023-11-21: qty 30, 5d supply, fill #0

## 2023-11-21 NOTE — Patient Instructions (Addendum)
  You are positive for covid.  Please read the attached information regarding covid and paxlovid . Take paxlovid  3 pills twice daily for 5 days. Common side effects include diarrhea or nausea and mild headache.  Take the cough medication at night as needed for cough. This can cause drowsiness.  Please rest and drink plenty of water. You can purchased over the counter oscillococcinum - this helps with body aches. Over the counter Quercetin with zinc helps boost your natural immune system to fight off the virus.

## 2023-11-21 NOTE — Progress Notes (Signed)
 Established Patient Office Visit  Subjective:  Patient ID: Nathaniel [name], male    DOB: 02-09-1966  Age: 58 y.o. MRN: [id number]  Chief Complaint  Patient presents with   Cough    X2 days    HPI  58 year-old male presents to clinic for dry cough, body aches, HA, post-nasal drainage, and SOB since yesterday. He reports cough worse at night and SOB with activity. No hx of lung problems. He is not treating symptoms with any OTC cough meds. He reports hx of seasonal allergies and takes his prescribed Allegra .    Denies fever.   Patient Active Problem List   Diagnosis Date Noted   Long term current use of insulin  (HCC) 10/09/2023   Hyperglycemia due to type 1 diabetes mellitus (HCC) 10/09/2023   Essential hypertension 10/09/2023   Chronic migraine without aura, not intractable, without status migrainosus 10/09/2023   Exertional dyspnea 10/09/2023   Scrotal pain 05/26/2023   Primary osteoarthritis of left knee 05/02/2022   Chronic throat clearing 07/20/2021   Presence of insulin  pump (external) (internal) 06/16/2021   Hyperlipidemia 08/11/2020   GERD (gastroesophageal reflux disease) 11/21/2019   Well adult exam 04/30/2019   Obstructive sleep apnea 06/09/2016   Uncontrolled type 1 diabetes mellitus with hyperglycemia (HCC) 10/02/2015   Cervical radiculitis 01/25/2015   Neck pain 02/27/2013   MDD (major depressive disorder) 12/19/2012   Chronic headaches 09/28/2011   Seasonal and perennial allergic rhinitis 09/28/2011   Fatigue 12/16/2008   Past Medical History:  Diagnosis Date   Allergy  1976   seasonal and food allergies   Arthritis 2015   possibly in neck, never diagnosed   Biceps tendinitis, left, distal 11/17/2020   Cervical radiculitis 01/25/2015   Chronic headaches 09/28/2011   Closed avulsion fracture of left ankle 02/24/2020   COVID-19 08/11/2020   Depression    Diabetes mellitus    type II   Fatigue 12/16/2008   Qualifier: Diagnosis of  By: Georgian ROSALEA CHARM Lamar    GERD (gastroesophageal reflux disease)    Headache(784.0)    Hyperlipidemia 08/11/2020   Hypogonadism male    Neck pain 02/27/2013   Obstructive sleep apnea 06/09/2016   Unattended Home Sleep Test 07/05/16-AHI 14.7/hour, desaturation to 79%, body weight 189 pounds.  AHI around 2.5 usually 04/23/18    Pain in right knee 08/07/2018   Presence of insulin  pump (external) (internal) 06/16/2021   Seasonal and perennial allergic rhinitis 09/28/2011   Sleep apnea    CPAP   Type 1 diabetes mellitus, uncontrolled 10/02/2015   Past Surgical History:  Procedure Laterality Date   NASAL SINUS SURGERY  04/1991   TONSILLECTOMY  1972   Social History   Tobacco Use   Smoking status: Never    Passive exposure: Never   Smokeless tobacco: Never  Vaping Use   Vaping status: Never Used  Substance Use Topics   Alcohol  use: No   Drug use: No      ROS: as noted in HPI  Objective:     BP 113/66   Pulse 84   Temp 99.3 F (37.4 C) (Oral)   Ht 6' (1.829 m)   Wt 234 lb (106.1 kg)   SpO2 100%   BMI 31.74 kg/m  BP Readings from Last 3 Encounters:  11/21/23 113/66  11/06/23 134/87  10/09/23 108/68   Wt Readings from Last 3 Encounters:  11/21/23 234 lb (106.1 kg)  10/09/23 235 lb (106.6 kg)  06/19/23 234 lb (106.1 kg)  Physical Exam Vitals and nursing note reviewed.  Constitutional:      General: He is not in acute distress.    Appearance: Normal appearance. He is not ill-appearing, toxic-appearing or diaphoretic.  HENT:     Right Ear: Tympanic membrane, ear canal and external ear normal. There is no impacted cerumen.     Left Ear: Tympanic membrane, ear canal and external ear normal. There is no impacted cerumen.     Nose: Congestion and rhinorrhea present.     Mouth/Throat:     Mouth: Mucous membranes are moist.     Pharynx: Oropharynx is clear. No oropharyngeal exudate or posterior oropharyngeal erythema.  Eyes:     General: No scleral icterus.       Right eye:  No discharge.        Left eye: No discharge.     Extraocular Movements: Extraocular movements intact.     Pupils: Pupils are equal, round, and reactive to light.  Cardiovascular:     Rate and Rhythm: Normal rate and regular rhythm.  Pulmonary:     Effort: Pulmonary effort is normal. No respiratory distress.     Breath sounds: Normal breath sounds. No stridor. No wheezing or rhonchi.  Musculoskeletal:     Cervical back: Normal range of motion and neck supple. No rigidity or tenderness.  Lymphadenopathy:     Cervical: No cervical adenopathy.  Neurological:     General: No focal deficit present.     Mental Status: He is alert and oriented to person, place, and time.  Psychiatric:        Mood and Affect: Mood normal.        Behavior: Behavior normal.      Results for orders placed or performed in visit on 11/21/23  POC Covid19/Flu A&B Antigen  Result Value Ref Range   Influenza A Antigen, POC Negative Negative   Influenza B Antigen, POC Negative Negative   Covid Antigen, POC Positive (A) Negative    Last CBC Lab Results  Component Value Date   WBC 8.7 10/09/2023   HGB 13.6 10/09/2023   HCT 43.1 10/09/2023   MCV 88 10/09/2023   MCH 27.7 10/09/2023   RDW 12.3 10/09/2023   PLT 239 10/09/2023   Last metabolic panel Lab Results  Component Value Date   GLUCOSE 127 (H) 10/09/2023   NA 137 10/09/2023   K 4.4 10/09/2023   CL 100 10/09/2023   CO2 17 (L) 10/09/2023   BUN 13 10/09/2023   CREATININE 1.08 10/09/2023   EGFR 80 10/09/2023   CALCIUM  9.5 10/09/2023   PROT 7.0 10/09/2023   ALBUMIN 4.6 10/09/2023   LABGLOB 2.4 10/09/2023   AGRATIO 1.8 06/24/2022   BILITOT 0.7 10/09/2023   ALKPHOS 89 10/09/2023   AST 27 10/09/2023   ALT 26 10/09/2023   ANIONGAP 10 06/01/2021      The 10-year ASCVD risk score (Arnett DK, et al., 2019) is: 8.9%  Assessment & Plan:  COVID-19 -     nirmatrelvir /ritonavir ; Take 3 tablets by mouth 2 (two) times daily for 5 days. (Take  nirmatrelvir  150 mg two tablets twice daily for 5 days and ritonavir  100 mg one tablet twice daily for 5 days) Patient GFR is 80  Dispense: 30 tablet; Refill: 0 -     Hydrocod Poli-Chlorphe Poli ER; Take 5 mLs by mouth at bedtime as needed for cough (cough, will cause drowsiness.).  Dispense: 70 mL; Refill: 0 -     POC Covid19/Flu A&B Antigen  Pt positive for covid. Day 2-3 of sx. Will start paxlovid  x 5 days, recent renal function normal. OTC meds / supportive care discussed. Tussionex PRN, at night.    No follow-ups on file.   Benton LITTIE Gave, PA

## 2023-11-22 ENCOUNTER — Ambulatory Visit: Admitting: Cardiology

## 2023-12-05 ENCOUNTER — Other Ambulatory Visit (HOSPITAL_BASED_OUTPATIENT_CLINIC_OR_DEPARTMENT_OTHER): Payer: Self-pay

## 2023-12-08 ENCOUNTER — Encounter: Payer: Self-pay | Admitting: Family Medicine

## 2023-12-08 DIAGNOSIS — E1065 Type 1 diabetes mellitus with hyperglycemia: Secondary | ICD-10-CM

## 2023-12-08 NOTE — Telephone Encounter (Unsigned)
 Copied from CRM #[id number]. Topic: Referral - Question >> Dec 08, 2023  3:15 PM Diannia H wrote: Reason for CRM: Patient is calling because he is wanting to know if his referral went to [facility] [address], [place], [place] [address] Tel [phone] and or somewhere in [place] and no where else. Could assist? Patients callback number is [phone].

## 2023-12-19 ENCOUNTER — Other Ambulatory Visit (HOSPITAL_BASED_OUTPATIENT_CLINIC_OR_DEPARTMENT_OTHER): Payer: Self-pay

## 2023-12-21 ENCOUNTER — Other Ambulatory Visit: Payer: Self-pay

## 2023-12-21 ENCOUNTER — Other Ambulatory Visit (HOSPITAL_BASED_OUTPATIENT_CLINIC_OR_DEPARTMENT_OTHER): Payer: Self-pay

## 2023-12-21 LAB — COMPREHENSIVE METABOLIC PANEL WITH GFR
Albumin: 3.9 (ref 3.5–5.0)
Calcium: 9 (ref 8.7–10.7)
eGFR: 80

## 2023-12-21 LAB — HEPATIC FUNCTION PANEL
ALT: 29 U/L (ref 10–40)
AST: 27 (ref 14–40)
Alkaline Phosphatase: 69 (ref 25–125)
Bilirubin, Total: 0.9

## 2023-12-21 LAB — BASIC METABOLIC PANEL WITH GFR
BUN: 10 (ref 4–21)
CO2: 26 — AB (ref 13–22)
Chloride: 96 — AB (ref 99–108)
Creatinine: 1.1 (ref 0.6–1.3)
Glucose: 293
Potassium: 4.1 meq/L (ref 3.5–5.1)
Sodium: 133 — AB (ref 137–147)

## 2023-12-21 LAB — CBC: RBC: 4.57 (ref 3.87–5.11)

## 2023-12-21 LAB — CBC AND DIFFERENTIAL
HCT: 40 — AB (ref 41–53)
Hemoglobin: 13.4 — AB (ref 13.5–17.5)
Neutrophils Absolute: 4.27
Platelets: 207 K/uL (ref 150–400)
WBC: 6.8

## 2023-12-21 LAB — HEMOGLOBIN A1C: Hemoglobin A1C: 10.7

## 2023-12-21 MED ORDER — KETONE TEST VI STRP
ORAL_STRIP | 1 refills | Status: DC
  Filled 2023-12-21: qty 50, 30d supply, fill #0

## 2023-12-21 MED ORDER — LANTUS SOLOSTAR 100 UNIT/ML ~~LOC~~ SOPN
40.0000 [IU] | PEN_INJECTOR | SUBCUTANEOUS | 1 refills | Status: DC
  Filled 2023-12-21: qty 9, 23d supply, fill #0

## 2023-12-21 MED ORDER — GVOKE HYPOPEN 2-PACK 1 MG/0.2ML ~~LOC~~ SOAJ
SUBCUTANEOUS | 1 refills | Status: AC
  Filled 2023-12-21: qty 0.4, 30d supply, fill #0

## 2023-12-25 ENCOUNTER — Other Ambulatory Visit (HOSPITAL_BASED_OUTPATIENT_CLINIC_OR_DEPARTMENT_OTHER): Payer: Self-pay

## 2023-12-25 ENCOUNTER — Other Ambulatory Visit: Payer: Self-pay

## 2024-01-01 ENCOUNTER — Other Ambulatory Visit (HOSPITAL_BASED_OUTPATIENT_CLINIC_OR_DEPARTMENT_OTHER): Payer: Self-pay

## 2024-01-08 ENCOUNTER — Other Ambulatory Visit (HOSPITAL_BASED_OUTPATIENT_CLINIC_OR_DEPARTMENT_OTHER): Payer: Self-pay

## 2024-01-08 MED ORDER — NAPROXEN 500 MG PO TABS
500.0000 mg | ORAL_TABLET | Freq: Two times a day (BID) | ORAL | 0 refills | Status: DC | PRN
  Filled 2024-01-08: qty 20, 10d supply, fill #0

## 2024-01-08 MED ORDER — METHOCARBAMOL 500 MG PO TABS
500.0000 mg | ORAL_TABLET | Freq: Two times a day (BID) | ORAL | 0 refills | Status: DC | PRN
  Filled 2024-01-08: qty 1, 1d supply, fill #0
  Filled 2024-01-08: qty 19, 9d supply, fill #0

## 2024-01-12 ENCOUNTER — Inpatient Hospital Stay: Admitting: Family Medicine

## 2024-01-12 ENCOUNTER — Encounter: Payer: Self-pay | Admitting: Family Medicine

## 2024-01-21 ENCOUNTER — Other Ambulatory Visit: Payer: Self-pay

## 2024-01-21 ENCOUNTER — Ambulatory Visit

## 2024-01-21 ENCOUNTER — Ambulatory Visit
Admission: RE | Admit: 2024-01-21 | Discharge: 2024-01-21 | Disposition: A | Discharge status: Home / Self Care | Source: Ambulatory Visit | Attending: Family Medicine | Admitting: Family Medicine

## 2024-01-21 VITALS — BP 127/84 | HR 102 | Resp 16

## 2024-01-21 DIAGNOSIS — R0789 Other chest pain: Secondary | ICD-10-CM | POA: Diagnosis not present

## 2024-01-21 DIAGNOSIS — S42013A Anterior displaced fracture of sternal end of unspecified clavicle, initial encounter for closed fracture: Secondary | ICD-10-CM | POA: Diagnosis not present

## 2024-01-21 MED ORDER — OXYCODONE-ACETAMINOPHEN 5-325 MG PO TABS: 1.0000 | ORAL_TABLET | Freq: Three times a day (TID) | ORAL | 0 refills | Status: AC | PRN

## 2024-01-21 MED ORDER — CELECOXIB 200 MG PO CAPS: 200.0000 mg | ORAL_CAPSULE | Freq: Every day | ORAL | 0 refills | Status: AC

## 2024-01-21 NOTE — Discharge Instructions (Addendum)
 Advised patient patient of chest x-ray results with hardcopy and image provided.  Advised patient to follow-up with [facility] this week for fracture management.  Advised may take Percocet daily or as needed for acute/severe sternum pain.  Patient advised of sedate of effects of this medication.  Advised to take medication as directed with food.  Encouraged to increase daily water intake to 64 ounces per day while taking these medications.  Advised if symptoms worsen   and/or unresolved please follow-up with your PCP, [facility], or here for further evaluation.
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
## 2024-01-22 ENCOUNTER — Ambulatory Visit (HOSPITAL_BASED_OUTPATIENT_CLINIC_OR_DEPARTMENT_OTHER): Admitting: Student

## 2024-01-22 ENCOUNTER — Encounter (HOSPITAL_BASED_OUTPATIENT_CLINIC_OR_DEPARTMENT_OTHER): Payer: Self-pay

## 2024-01-22 ENCOUNTER — Ambulatory Visit

## 2024-01-22 ENCOUNTER — Ambulatory Visit (INDEPENDENT_AMBULATORY_CARE_PROVIDER_SITE_OTHER)

## 2024-01-22 ENCOUNTER — Other Ambulatory Visit (HOSPITAL_BASED_OUTPATIENT_CLINIC_OR_DEPARTMENT_OTHER): Payer: Self-pay

## 2024-01-22 ENCOUNTER — Encounter: Payer: Self-pay | Admitting: Radiology

## 2024-01-22 VITALS — BP 120/80 | Ht 72.0 in | Wt 234.0 lb

## 2024-01-22 DIAGNOSIS — S2222XA Fracture of body of sternum, initial encounter for closed fracture: Secondary | ICD-10-CM
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
## 2024-01-23 ENCOUNTER — Ambulatory Visit (INDEPENDENT_AMBULATORY_CARE_PROVIDER_SITE_OTHER)

## 2024-01-23 ENCOUNTER — Other Ambulatory Visit (HOSPITAL_BASED_OUTPATIENT_CLINIC_OR_DEPARTMENT_OTHER): Payer: Self-pay

## 2024-01-23 DIAGNOSIS — S2222XA Fracture of body of sternum, initial encounter for closed fracture: Secondary | ICD-10-CM | POA: Diagnosis not present

## 2024-01-24 ENCOUNTER — Other Ambulatory Visit: Payer: Self-pay

## 2024-01-25 ENCOUNTER — Ambulatory Visit: Payer: Self-pay

## 2024-01-26 ENCOUNTER — Other Ambulatory Visit (HOSPITAL_BASED_OUTPATIENT_CLINIC_OR_DEPARTMENT_OTHER): Payer: Self-pay

## 2024-01-26 MED ORDER — METFORMIN HCL ER 500 MG PO TB24
2000.0000 mg | ORAL_TABLET | Freq: Every day | ORAL | 1 refills | Status: AC
  Filled 2024-01-26: qty 360, 90d supply, fill #0

## 2024-01-30 ENCOUNTER — Ambulatory Visit
Attending: Thoracic Surgery (Cardiothoracic Vascular Surgery) | Admitting: Thoracic Surgery (Cardiothoracic Vascular Surgery)

## 2024-01-30 VITALS — BP 142/77 | HR 99 | Resp 18 | Ht 72.0 in | Wt 234.0 lb

## 2024-01-30 DIAGNOSIS — S2220XA Unspecified fracture of sternum, initial encounter for closed fracture: Secondary | ICD-10-CM
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
## 2024-02-01 ENCOUNTER — Other Ambulatory Visit (HOSPITAL_BASED_OUTPATIENT_CLINIC_OR_DEPARTMENT_OTHER): Payer: Self-pay

## 2024-02-02 ENCOUNTER — Other Ambulatory Visit (HOSPITAL_BASED_OUTPATIENT_CLINIC_OR_DEPARTMENT_OTHER): Payer: Self-pay

## 2024-02-02 MED ORDER — LYUMJEV 100 UNIT/ML IJ SOLN
INTRAMUSCULAR | 5 refills | Status: AC
  Filled 2024-02-02: qty 40, 40d supply, fill #0

## 2024-02-06 ENCOUNTER — Other Ambulatory Visit (HOSPITAL_BASED_OUTPATIENT_CLINIC_OR_DEPARTMENT_OTHER): Payer: Self-pay

## 2024-02-06 ENCOUNTER — Ambulatory Visit: Admitting: Family Medicine

## 2024-02-06 ENCOUNTER — Encounter: Payer: Self-pay | Admitting: Family Medicine

## 2024-02-06 VITALS — BP 138/69 | HR 87 | Ht 72.0 in | Wt 237.0 lb

## 2024-02-06 DIAGNOSIS — S2222XA Fracture of body of sternum, initial encounter for closed fracture: Secondary | ICD-10-CM

## 2024-02-06 DIAGNOSIS — Z1211 Encounter for screening for malignant neoplasm of colon: Secondary | ICD-10-CM | POA: Diagnosis not present

## 2024-02-06 DIAGNOSIS — S2220XA Unspecified fracture of sternum, initial encounter for closed fracture: Secondary | ICD-10-CM | POA: Insufficient documentation

## 2024-02-06 MED ORDER — TRAMADOL HCL 50 MG PO TABS
50.0000 mg | ORAL_TABLET | Freq: Three times a day (TID) | ORAL | 0 refills | Status: AC | PRN
Start: 2024-02-06 — End: 2024-02-11
  Filled 2024-02-06: qty 15, 5d supply, fill #0

## 2024-02-06 MED ORDER — LIDOCAINE 5 % EX PTCH
1.0000 | MEDICATED_PATCH | Freq: Two times a day (BID) | CUTANEOUS | 2 refills | Status: DC
  Filled 2024-02-06: qty 30, 30d supply, fill #0

## 2024-02-06 NOTE — Progress Notes (Signed)
 Pt declined Foot Exam. Pt declined immunizations.

## 2024-02-06 NOTE — Patient Instructions (Signed)
 Use incentive spirometer every 2-3 hours.  Continue celebrex and tylenol  as needed.  Tramadol  as needed.  Lidoderm patches sent in.  Red flags reviewed.
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
## 2024-02-06 NOTE — Assessment & Plan Note (Signed)
 Minimal displacement.  Has had consultation with sports med and CT surgery.  Recommend conservative treatment. Continue celebrex and tylenol  prn.  Adding tramadol  prn.  Lidoderm patches and can try OTC voltaren  gel over area if needed.    Recommend using pillow when coughing, laughing, or sneezing.  Incentive spirometer provided as he did have some atelectasis on previous xray.

## 2024-02-08 ENCOUNTER — Encounter: Payer: Self-pay | Admitting: Family Medicine

## 2024-02-08 ENCOUNTER — Other Ambulatory Visit (HOSPITAL_BASED_OUTPATIENT_CLINIC_OR_DEPARTMENT_OTHER): Payer: Self-pay

## 2024-02-13 ENCOUNTER — Other Ambulatory Visit (HOSPITAL_BASED_OUTPATIENT_CLINIC_OR_DEPARTMENT_OTHER): Payer: Self-pay

## 2024-02-13 ENCOUNTER — Other Ambulatory Visit: Payer: Self-pay | Admitting: Family Medicine

## 2024-02-13 DIAGNOSIS — R0989 Other specified symptoms and signs involving the circulatory and respiratory systems: Secondary | ICD-10-CM

## 2024-02-13 MED ORDER — ESOMEPRAZOLE MAGNESIUM 40 MG PO CPDR
40.0000 mg | DELAYED_RELEASE_CAPSULE | Freq: Every day | ORAL | 0 refills | Status: DC
  Filled 2024-02-13: qty 90, 90d supply, fill #0

## 2024-02-16 ENCOUNTER — Other Ambulatory Visit (HOSPITAL_BASED_OUTPATIENT_CLINIC_OR_DEPARTMENT_OTHER): Payer: Self-pay

## 2024-02-19 ENCOUNTER — Other Ambulatory Visit (HOSPITAL_BASED_OUTPATIENT_CLINIC_OR_DEPARTMENT_OTHER): Payer: Self-pay

## 2024-02-25 ENCOUNTER — Telehealth: Admitting: Physician Assistant

## 2024-02-25 ENCOUNTER — Ambulatory Visit
Admission: RE | Admit: 2024-02-25 | Discharge: 2024-02-25 | Disposition: A | Discharge status: Home / Self Care | Source: Ambulatory Visit | Attending: Nurse Practitioner

## 2024-02-25 ENCOUNTER — Encounter: Payer: Self-pay | Admitting: Physician Assistant

## 2024-02-25 VITALS — BP 154/67 | HR 84 | Temp 98.2°F | Resp 14

## 2024-02-25 DIAGNOSIS — L304 Erythema intertrigo: Secondary | ICD-10-CM

## 2024-02-25 DIAGNOSIS — D239 Other benign neoplasm of skin, unspecified: Secondary | ICD-10-CM | POA: Diagnosis not present

## 2024-02-25 DIAGNOSIS — R21 Rash and other nonspecific skin eruption: Secondary | ICD-10-CM

## 2024-02-25 MED ORDER — FLUCONAZOLE 150 MG PO TABS: 150.0000 mg | ORAL_TABLET | ORAL | 0 refills | Status: AC

## 2024-02-25 MED ORDER — CLOTRIMAZOLE-BETAMETHASONE 1-0.05 % EX CREA: TOPICAL_CREAM | CUTANEOUS | 0 refills | Status: AC

## 2024-02-25 MED ORDER — CEPHALEXIN 500 MG PO CAPS: 500.0000 mg | ORAL_CAPSULE | Freq: Three times a day (TID) | ORAL | 0 refills | Status: AC

## 2024-02-25 NOTE — ED Triage Notes (Signed)
 Pt c/o groin pain with red blisters on scrotum for over 1 month. States blisters bled yesterday.

## 2024-02-25 NOTE — Progress Notes (Signed)
 Because of your description of the sores, I feel your condition warrants further evaluation and I recommend that you be seen in a face-to-face visit.   NOTE: There will be NO CHARGE for this E-Visit   If you are having a true medical emergency, please call 911.     For an urgent face to face visit, [facility] has multiple urgent care centers for your convenience.  Click the link below for the full list of locations and hours, walk-in wait times, appointment scheduling options and driving directions:  Urgent Care - [place], [place], [place], [place], [place], [place]  [place]     Your MyChart E-visit questionnaire answers were reviewed by a board certified advanced clinical practitioner to complete your personal care plan based on your specific symptoms.    Thank you for using e-Visits.

## 2024-02-25 NOTE — ED Provider Notes (Signed)
 [facility]    CSN: [id number] Arrival date & time: 02/25/24  1025      History   Chief Complaint Chief Complaint  Patient presents with   Groin Pain    Some redness and pain from blister on scrotum. Bled two days ago - Entered by patient    HPI Nathaniel [name] is a 58 y.o. male.   Discussed the use of AI scribe software for clinical note transcription with the patient, who gave verbal consent to proceed.   The patient presents with chronic scrotal and groin itching that has been ongoing for several months, with recent development of a painful raised lesion following accidental trauma from scratching the affected area. The itching initially began on one side of the scrotum, localized to the inferior aspect, and gradually progressed to involve both sides. During repeated scratching episodes, the patient caused a bleeding wound that later developed into blood blister-type lesions, with one lesion becoming more raised and painful than the others.  The patient reports using multiple topical treatments over the past several months, including 3-4 different ointments. Nystatin  cream provided initial temporary relief but subsequently became ineffective. He notes associated skin discoloration in the area related to chronic scratching and reports frequent sweating in the groin region. The persistent itching has resulted in ongoing discomfort.  Earlier this year, the patient experienced scrotal swelling and pain and underwent a scrotal ultrasound that demonstrated bilateral varicoceles. He was treated with a three-week course of antibiotics for epididymitis with full symptom resolution. He remained asymptomatic from March until the onset of the current chronic itching.  His medical history is significant for diabetes mellitus, with a most recent hemoglobin A1c of 10.7% in October. He recently initiated use of a continuous glucose monitor and insulin  pump one week ago and reports improved  glycemic control since the change. He has a known allergy  to penicillin.  The following sections of the patient's history were reviewed and updated as appropriate: allergies, current medications, past family history, past medical history, past social history, past surgical history, and problem list.       Past Medical History:  Diagnosis Date   Allergy  1976   seasonal and food allergies   Arthritis 2015   possibly in neck, never diagnosed   Biceps tendinitis, left, distal 11/17/2020   Cervical radiculitis 01/25/2015   Chronic headaches 09/28/2011   Closed avulsion fracture of left ankle 02/24/2020   COVID-19 08/11/2020   Depression    Diabetes mellitus    type II   Fatigue 12/16/2008   Qualifier: Diagnosis of  By: Georgian ROSALEA CHARM Lamar    GERD (gastroesophageal reflux disease)    Headache(784.0)    Hyperlipidemia 08/11/2020   Hypogonadism male    Neck pain 02/27/2013   Obstructive sleep apnea 06/09/2016   Unattended Home Sleep Test 07/05/16-AHI 14.7/hour, desaturation to 79%, body weight 189 pounds.  AHI around 2.5 usually 04/23/18    Pain in right knee 08/07/2018   Presence of insulin  pump (external) (internal) 06/16/2021   Seasonal and perennial allergic rhinitis 09/28/2011   Sleep apnea    CPAP   Type 1 diabetes mellitus, uncontrolled 10/02/2015    Patient Active Problem List   Diagnosis Date Noted   Sternal fracture 02/06/2024   Long term current use of insulin  (HCC) 10/09/2023   Hyperglycemia due to type 1 diabetes mellitus (HCC) 10/09/2023   Essential hypertension 10/09/2023   Chronic migraine without aura, not intractable, without status migrainosus 10/09/2023   Exertional  dyspnea 10/09/2023   Scrotal pain 05/26/2023   Primary osteoarthritis of left knee 05/02/2022   Chronic throat clearing 07/20/2021   Presence of insulin  pump (external) (internal) 06/16/2021   Hyperlipidemia 08/11/2020   GERD (gastroesophageal reflux disease) 11/21/2019   Well adult exam  04/30/2019   Obstructive sleep apnea 06/09/2016   Uncontrolled type 1 diabetes mellitus with hyperglycemia (HCC) 10/02/2015   Cervical radiculitis 01/25/2015   Neck pain 02/27/2013   MDD (major depressive disorder) 12/19/2012   Chronic headaches 09/28/2011   Seasonal and perennial allergic rhinitis 09/28/2011   Fatigue 12/16/2008    Past Surgical History:  Procedure Laterality Date   NASAL SINUS SURGERY  04/1991   TONSILLECTOMY  1972       Home Medications    Prior to Admission medications   Medication Sig Start Date End Date Taking? Authorizing Provider  cephALEXin  (KEFLEX ) 500 MG capsule Take 1 capsule (500 mg total) by mouth 3 (three) times daily for 5 days. 02/25/24 03/01/24 Yes Iola Lukes, FNP  clotrimazole -betamethasone  (LOTRISONE ) cream Apply to affected areas within the groin twice a day for 1 week 02/25/24  Yes Iola Lukes, FNP  fluconazole  (DIFLUCAN ) 150 MG tablet Take 1 tablet (150 mg total) by mouth every 3 (three) days for 2 doses. 02/25/24 02/29/24 Yes Iola Lukes, FNP  Acetone, Urine, Test (KETONE TEST) STRP Use to check for ketones if blood sugar is over 300 and you feel bad.  Go to ED if positive. 12/21/23     Continuous Glucose Sensor (FREESTYLE LIBRE 3 PLUS SENSOR) MISC Apply 1 sensor every 15 days as directed. 04/25/23     Continuous Glucose Sensor (FREESTYLE LIBRE 3 PLUS SENSOR) MISC Use as directed to check blood sugar and change every 15 days. 06/13/23     Continuous Glucose Sensor (FREESTYLE LIBRE 3 SENSOR) MISC Apply on to the skin every 14 days. 02/16/22   Trixie File, MD  cyclobenzaprine  (FLEXERIL ) 10 MG tablet Take 10 mg by mouth 3 (three) times daily as needed. 01/17/24   [provider]  esomeprazole  (NEXIUM ) 40 MG capsule Take 1 capsule (40 mg total) by mouth daily at dinner time. 02/13/24   Alvia Bring, DO  fexofenadine  (ALLEGRA ) 180 MG tablet Take 1 tablet (180 mg total) by mouth daily. 12/20/21   Alvia Bring, DO   glucose blood (FREESTYLE LITE) test strip Check blood sugars 3 (three) times daily. 01/03/23   Roy Harlene HERO, NP  GVOKE HYPOPEN  2-PACK 1 MG/0.2ML SOAJ Inject 0.2 mLs (1 mg dose) into the skin as needed (For severe hypoglycemia). Inject 1 mg as needed for severe low sugars if cannot eat/drink. Call 911 after use. 12/21/23     Insulin  Lispro-aabc (LYUMJEV ) 100 UNIT/ML SOLN To be used in insulin  pump. Max daily dose 100 units. 02/02/24     lidocaine  (LIDODERM ) 5 % Place 1 patch onto the skin every 12 (twelve) hours. Remove & Discard patch within 12 hours or as directed by MD 02/06/24   Alvia Bring, DO  losartan  (COZAAR ) 25 MG tablet Take 1 tablet (25 mg total) by mouth daily. 11/15/23     metFORMIN  (GLUCOPHAGE -XR) 500 MG 24 hr tablet Take 4 tablets (2,000 mg total) by mouth daily with breakfast. 01/26/24     rosuvastatin  (CRESTOR ) 10 MG tablet Take 1 tablet (10 mg total) by mouth daily. 10/24/23       Family History Family History  Adopted: Yes  Problem Relation Age of Onset   Cancer Father 6  Prostate cancer   Diabetes Mellitus I Son     Social History Social History   Tobacco Use   Smoking status: Never    Passive exposure: Never   Smokeless tobacco: Never  Vaping Use   Vaping status: Never Used  Substance Use Topics   Alcohol  use: No   Drug use: No     Allergies   Cymbalta [duloxetine hcl], Influenza vaccines, Penicillins, Tobacco, Ace inhibitors, Iodinated contrast media, Pioglitazone, Promethazine hcl, and Reglan [metoclopramide]   Review of Systems Review of Systems  Constitutional:  Negative for fever.  Genitourinary:  Negative for genital sores, penile discharge, penile pain, penile swelling, scrotal swelling and testicular pain.  Skin:  Positive for rash and wound.  All other systems reviewed and are negative.    Physical Exam Triage Vital Signs ED Triage Vitals  Encounter Vitals Group     BP 02/25/24 1108 (!) 154/67     Girls Systolic BP Percentile  --      Girls Diastolic BP Percentile --      Boys Systolic BP Percentile --      Boys Diastolic BP Percentile --      Pulse Rate 02/25/24 1108 84     Resp 02/25/24 1108 14     Temp 02/25/24 1108 98.2 F (36.8 C)     Temp Source 02/25/24 1108 Oral     SpO2 02/25/24 1108 95 %     Weight --      Height --      Head Circumference --      Peak Flow --      Pain Score 02/25/24 1128 9     Pain Loc --      Pain Education --      Exclude from Growth Chart --    No data found.  Updated Vital Signs BP (!) 154/67 (BP Location: Right Arm)   Pulse 84   Temp 98.2 F (36.8 C) (Oral)   Resp 14   SpO2 95%   Visual Acuity Right Eye Distance:   Left Eye Distance:   Bilateral Distance:    Right Eye Near:   Left Eye Near:    Bilateral Near:     Physical Exam Vitals reviewed. Exam conducted with a chaperone present Sidra D., RN).  Constitutional:      General: He is awake. He is not in acute distress.    Appearance: Normal appearance. He is well-developed. He is not ill-appearing, toxic-appearing or diaphoretic.  HENT:     Head: Normocephalic.     Right Ear: Hearing normal.     Left Ear: Hearing normal.     Nose: Nose normal.     Mouth/Throat:     Mouth: Mucous membranes are moist.  Eyes:     General: Vision grossly intact.     Conjunctiva/sclera: Conjunctivae normal.  Cardiovascular:     Rate and Rhythm: Normal rate and regular rhythm.     Heart sounds: Normal heart sounds.  Pulmonary:     Effort: Pulmonary effort is normal.     Breath sounds: Normal breath sounds and air entry.  Genitourinary:    Penis: Normal and circumcised.      Testes: Normal.     Comments: Scrotal examination reveals multiple small dark red to purple vascular-appearing spots consistent with superficial angiokeratoma-type lesions. No active bleeding or ulceration is noted. No swelling, erythema, or deformity of the scrotum or testes. Testes are normal in size and contour without masses or tenderness.  Penis is without lesions, discharge, swelling, or erythema. Bilateral deep groin folds demonstrate areas of erythema and irritation with mild maceration consistent with intertrigo. No open wounds, drainage, or secondary infection observed. Musculoskeletal:        General: Normal range of motion.     Cervical back: Normal range of motion and neck supple.  Skin:    General: Skin is warm and dry.  Neurological:     General: No focal deficit present.     Mental Status: He is alert and oriented to person, place, and time.  Psychiatric:        Speech: Speech normal.        Behavior: Behavior is cooperative.      UC Treatments / Results  Labs (all labs ordered are listed, but only abnormal results are displayed) Labs Reviewed - No data to display  EKG   Radiology No results found.  Procedures Procedures (including critical care time)  Medications Ordered in UC Medications - No data to display  Initial Impression / Assessment and Plan / UC Course  I have reviewed the triage vital signs and the nursing notes.  Pertinent labs & imaging results that were available during my care of the patient were reviewed by me and considered in my medical decision making (see chart for details).     The patient presents with chronic scrotal and groin pruritus for several months with recent development of a painful raised lesion following skin trauma from scratching. Symptoms began unilaterally and progressed bilaterally, associated with skin discoloration and frequent moisture exposure in the groin. Prior use of multiple topical agents, including nystatin , provided only temporary relief. 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 Testes and  penis are normal on exam. The patient's poorly controlled diabetes is a significant contributing risk factor for chronic candidal or inflammatory intertrigo and delayed skin healing, though he reports recent improvement in glycemic control with new CGM and insulin  pump use.  Management includes treatment for secondary bacterial contamination and fungal/inflammatory skin involvement with cephalexin  three times daily for five days, oral fluconazole , and clotrimazole -betamethasone  topical cream to affected groin areas. Supportive care was reviewed, including keeping the area clean and dry, thoroughly drying after bathing or sweating, avoiding scratching or skin trauma, wearing loose-fitting cotton underwear, and avoiding tight clothing to reduce friction and moisture. The importance of ongoing diabetes control through glucose monitoring, medication compliance, appropriate diet, and maintaining a healthy weight was emphasized. The patient was advised to follow up with his primary care provider for reassessment of skin symptoms and diabetes management, endocrinology for ongoing glycemic optimization, and dermatology as needed for persistent or worsening lesions or confirmation of angiokeratomas. He was instructed to seek urgent evaluation for spreading redness, warmth, fever, increasing pain or swelling, drainage, ulceration, or rapid changes in the scrotal lesions.  Today's evaluation has revealed no signs of a dangerous process. Discussed diagnosis with patient and/or guardian. Patient and/or guardian aware of their diagnosis, possible red flag symptoms to watch out for and need for close follow up. Patient and/or guardian understands verbal and written discharge instructions. Patient and/or guardian comfortable with plan and disposition.  Patient and/or guardian has a clear mental status at this time, good insight into illness (after discussion and teaching) and has clear judgment to make decisions regarding their  care  Documentation was completed with the aid of voice recognition software. Transcription may contain typographical errors.  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 Your exam is most consistent with inflammatory intertrigo (skin irritation from moisture and friction) and benign vascular spots called angiokeratomas on the scrotum. There is no sign of a serious infection or scrotal emergency today. Take all prescribed medications as directed, including the antibiotic, antifungal tablet, and the clotrimazole -betamethasone  cream applied to the affected groin skin. Keep the area clean and completely dry, especially after bathing or sweating. Pat the skin dry, use a cool setting on a hair dryer if needed, and avoid scratching to allow the skin to heal. Wear loose-fitting cotton underwear and avoid tight clothing to reduce moisture and friction. Limiting sweating when possible and maintaining good glucose control are very important, as elevated blood sugars can worsen skin infections and delay healing. Continue monitoring your glucose closely and remain consistent with your insulin , diet, and weight-management plan. Follow up with your primary care provider to reassess your symptoms and manage ongoing care, with referrals to endocrinology for diabetes management and dermatology if the lesions persist, change in appearance, or continue to cause discomfort despite treatment. Seek emergency care right away if you develop rapidly worsening pain, spreading redness or swelling of the groin or scrotum, fever or chills, drainage or pus from the skin, open sores or blackened areas, difficulty urinating, or sudden severe testicular pain or swelling.     ED Prescriptions     Medication  Sig Dispense Auth. Provider   clotrimazole -betamethasone  (LOTRISONE ) cream Apply to affected areas within the groin twice a day for 1 week 45 g Iola Lukes, FNP   cephALEXin  (KEFLEX ) 500 MG capsule Take 1 capsule (500 mg total) by mouth 3 (three) times daily for 5 days. 15 capsule Iola Lukes, FNP   fluconazole  (DIFLUCAN ) 150 MG tablet Take 1 tablet (150 mg total) by mouth every 3 (three) days for 2 doses. 2 tablet Iola Lukes, FNP      PDMP not reviewed this encounter.   Iola Lukes, [place] 02/25/24 1244

## 2024-02-25 NOTE — Discharge Instructions (Addendum)
 You were seen today for long-standing itching of the groin and scrotum with recent irritation and painful spots caused by scratching. Your exam is most consistent with inflammatory intertrigo (skin irritation from moisture and friction) and benign vascular spots called angiokeratomas on the scrotum. There is no sign of a serious infection or scrotal emergency today. Take all prescribed medications as directed, including the antibiotic, antifungal tablet, and the clotrimazole -betamethasone  cream applied to the affected groin skin. Keep the area clean and completely dry, especially after bathing or sweating. Pat the skin dry, use a cool setting on a hair dryer if needed, and avoid scratching to allow the skin to heal. Wear loose-fitting cotton underwear and avoid tight clothing to reduce moisture and friction. Limiting sweating when possible and maintaining good glucose control are very important, as elevated blood sugars can worsen skin infections and delay healing. Continue monitoring your glucose closely and remain consistent with your insulin , diet, and weight-management plan. Follow up with your primary care provider to reassess your symptoms and manage ongoing care, with referrals to endocrinology for diabetes management and dermatology if the lesions persist, change in appearance, or continue to cause discomfort despite treatment. Seek emergency care right away if you develop rapidly worsening pain, spreading redness or swelling of the groin or scrotum, fever or chills, drainage or pus from the skin, open sores or blackened areas, difficulty urinating, or sudden severe testicular pain or swelling.

## 2024-02-26 ENCOUNTER — Other Ambulatory Visit (HOSPITAL_BASED_OUTPATIENT_CLINIC_OR_DEPARTMENT_OTHER): Payer: Self-pay

## 2024-02-27 ENCOUNTER — Other Ambulatory Visit (HOSPITAL_BASED_OUTPATIENT_CLINIC_OR_DEPARTMENT_OTHER): Payer: Self-pay

## 2024-02-27 ENCOUNTER — Other Ambulatory Visit: Payer: Self-pay

## 2024-02-27 MED ORDER — LYUMJEV 100 UNIT/ML IJ SOLN
INTRAMUSCULAR | 5 refills | Status: AC
  Filled 2024-02-27: qty 50, 38d supply, fill #0
  Filled 2024-04-03: qty 50, 38d supply, fill #1

## 2024-02-28 ENCOUNTER — Other Ambulatory Visit (HOSPITAL_BASED_OUTPATIENT_CLINIC_OR_DEPARTMENT_OTHER): Payer: Self-pay

## 2024-02-28 MED ORDER — KETONE TEST VI STRP
ORAL_STRIP | 1 refills | Status: AC
  Filled 2024-02-28: qty 50, 50d supply, fill #0

## 2024-02-29 ENCOUNTER — Other Ambulatory Visit (HOSPITAL_BASED_OUTPATIENT_CLINIC_OR_DEPARTMENT_OTHER): Payer: Self-pay

## 2024-03-03 ENCOUNTER — Encounter: Payer: Self-pay | Admitting: Family Medicine

## 2024-03-05 ENCOUNTER — Ambulatory Visit: Admitting: Family Medicine

## 2024-03-06 ENCOUNTER — Ambulatory Visit: Admitting: Family Medicine

## 2024-03-18 ENCOUNTER — Encounter: Payer: Self-pay | Admitting: Family Medicine

## 2024-03-18 ENCOUNTER — Ambulatory Visit: Admitting: Family Medicine

## 2024-03-18 VITALS — BP 112/69 | HR 74 | Ht 72.0 in | Wt 246.0 lb

## 2024-03-18 DIAGNOSIS — M545 Low back pain, unspecified: Secondary | ICD-10-CM | POA: Diagnosis not present

## 2024-03-18 DIAGNOSIS — S2220XG Unspecified fracture of sternum, subsequent encounter for fracture with delayed healing: Secondary | ICD-10-CM

## 2024-03-18 MED ORDER — GABAPENTIN 300 MG PO CAPS: ORAL_CAPSULE | ORAL | 3 refills | Status: AC
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
## 2024-03-18 NOTE — Assessment & Plan Note (Signed)
X-rays of lumbar spine ordered. 

## 2024-03-18 NOTE — Assessment & Plan Note (Signed)
 Continues to have quite a bit of pain in the distal sternum.  Repeat CT scan to ensure that fracture is healing appropriately.  He may continue Tylenol .  Adding gabapentin  as well as if this is helpful for pain management.

## 2024-03-20 ENCOUNTER — Telehealth: Payer: Self-pay

## 2024-03-20 ENCOUNTER — Encounter: Payer: Self-pay | Admitting: Family Medicine

## 2024-03-20 ENCOUNTER — Telehealth: Payer: Self-pay | Admitting: Family Medicine

## 2024-03-20 DIAGNOSIS — S2222XA Fracture of body of sternum, initial encounter for closed fracture: Secondary | ICD-10-CM

## 2024-03-20 NOTE — Telephone Encounter (Signed)
Faxed to correct number

## 2024-03-20 NOTE — Telephone Encounter (Signed)
 Copied from CRM #[id number]. Topic: General - Other >> Mar 20, 2024 10:26 AM [name] wrote: Reason for CRM: Patient called and said the fax for Ct scan was sent to the wrong location, it suppose to be sent to the [facility] in [place] not [place] [address] >> Mar 20, 2024  2:41 PM [name] wrote: Patient is requesting to have a xray order sent over as well. Please advise.

## 2024-03-20 NOTE — Telephone Encounter (Signed)
 Faxed imaging results.

## 2024-03-20 NOTE — Telephone Encounter (Signed)
 Copied from CRM #[id number]. Topic: General - Other >> Mar 20, 2024 10:26 AM [name] wrote: Reason for CRM: Patient called and said the fax for Ct scan was sent to the wrong location, it suppose to be sent to the [facility] in [place] not [place] [address]

## 2024-03-20 NOTE — Telephone Encounter (Signed)
 Copied from CRM [id number]. Topic: General - Other >> Mar 20, 2024  8:37 AM Aleatha BROCKS wrote: Reason for CRM: Lamont from [place] site of care Need images fax over to [facility] imaging for Ct chest without contrast Fax # [phone] Call back [phone]

## 2024-03-20 NOTE — Telephone Encounter (Signed)
 O.k. to change the imaging order locations and refax?

## 2024-03-22 NOTE — Telephone Encounter (Signed)
 Would you be able to assist this patient with the change of location for the CT?

## 2024-03-22 NOTE — Telephone Encounter (Signed)
 Thank you Nathaniel [name] for helping me through that.  The orders have been changed and resubmitted - would you check that this is what you need?

## 2024-03-25 ENCOUNTER — Encounter: Payer: Self-pay | Admitting: Family Medicine

## 2024-03-25 DIAGNOSIS — M545 Low back pain, unspecified: Secondary | ICD-10-CM

## 2024-03-25 NOTE — Telephone Encounter (Signed)
 I faxed an order for external x-ray.

## 2024-03-28 ENCOUNTER — Other Ambulatory Visit (HOSPITAL_BASED_OUTPATIENT_CLINIC_OR_DEPARTMENT_OTHER): Payer: Self-pay

## 2024-03-29 NOTE — Telephone Encounter (Signed)
 Spoke with Maddie from [facility] to see if I could get the results  expedited by the radiologist and sent to Dr. Alvia for review. She provided another number [phone] where I spoke with a Orie who stated she will push them to be expedited.

## 2024-04-02 NOTE — Telephone Encounter (Signed)
 Last read by Reyes DELENA Sallye [name] at 9:46AM on 04/02/2024.

## 2024-04-02 NOTE — Telephone Encounter (Signed)
 Pended referral to PT

## 2024-04-02 NOTE — Addendum Note (Signed)
 Addended by: Jaegar Croft E on: 04/02/2024 04:43 PM   Modules accepted: Orders

## 2024-04-02 NOTE — Telephone Encounter (Signed)
 Spoke with Ellouise from [facility] today who states the report is in the system. I asked could she fax a copy to us . [phone].

## 2024-04-02 NOTE — Addendum Note (Signed)
 Addended by: Stanislav Gervase P on: 04/02/2024 04:26 PM   Modules accepted: Orders

## 2024-04-03 ENCOUNTER — Other Ambulatory Visit (HOSPITAL_BASED_OUTPATIENT_CLINIC_OR_DEPARTMENT_OTHER): Payer: Self-pay

## 2024-04-12 ENCOUNTER — Encounter: Payer: Self-pay | Admitting: Family Medicine

## 2024-04-15 ENCOUNTER — Encounter: Payer: Self-pay | Admitting: Family Medicine

## 2024-04-16 ENCOUNTER — Telehealth: Payer: Self-pay

## 2024-04-16 DIAGNOSIS — R0989 Other specified symptoms and signs involving the circulatory and respiratory systems: Secondary | ICD-10-CM

## 2024-04-16 MED ORDER — ESOMEPRAZOLE MAGNESIUM 40 MG PO CPDR: 40.0000 mg | DELAYED_RELEASE_CAPSULE | Freq: Every day | ORAL | 2 refills | Status: AC

## 2024-04-16 NOTE — Telephone Encounter (Signed)
 Copied from CRM #[id number]. Topic: Clinical - Medication Refill >> Apr 15, 2024  5:27 PM Delon T wrote: Medication: esomeprazole  (NEXIUM ) 40 MG capsule  Has the patient contacted their pharmacy? Yes (Agent: If no, request that the patient contact the pharmacy for the refill. If patient does not wish to contact the pharmacy document the reason why and proceed with request.) (Agent: If yes, when and what did the pharmacy advise?)  This is the patient's preferred pharmacy:   [facility] [phone]  Is this the correct pharmacy for this prescription? Yes If no, delete pharmacy and type the correct one.   Has the prescription been filled recently? Yes  Is the patient out of the medication? Yes  Has the patient been seen for an appointment in the last year OR does the patient have an upcoming appointment? Yes  Can we respond through MyChart? Yes  Agent: Please be advised that Rx refills may take up to 3 business days. We ask that you follow-up with your pharmacy.

## 2024-04-16 NOTE — Telephone Encounter (Unsigned)
 Copied from CRM [id number]. Topic: Clinical - Medication Refill >> Apr 16, 2024 11:06 AM Rosaria A wrote: Medication: medications: losartan  (COZAAR ) 25 MG tablet rosuvastatin  (CRESTOR ) 10 MG tablet metFORMIN  (GLUCOPHAGE -XR) 500 MG 24 hr tablet    Has the patient contacted their pharmacy? No Ella with [employer] [employer] is calling in for patients refill.    This is the patient's preferred pharmacy:   [facility] - [place], [place] - [address] [place] [address] Phone: [phone] Fax: [phone]  Is this the correct pharmacy for this prescription? Yes   Has the prescription been filled recently? No  Is the patient out of the medication? No  Has the patient been seen for an appointment in the last year OR does the patient have an upcoming appointment? Yes  Can we respond through MyChart? No  Agent: Please be advised that Rx refills may take up to 3 business days. We ask that you follow-up with your pharmacy.

## 2024-04-24 ENCOUNTER — Encounter: Payer: Self-pay | Admitting: Family Medicine

## 2024-04-24 DIAGNOSIS — R0989 Other specified symptoms and signs involving the circulatory and respiratory systems: Secondary | ICD-10-CM

## 2024-04-24 MED ORDER — LOSARTAN POTASSIUM 25 MG PO TABS: 25.0000 mg | ORAL_TABLET | Freq: Every day | ORAL | 1 refills | Status: AC

## 2024-04-24 NOTE — Telephone Encounter (Signed)
 Requesting Losartan  25mg   Last written 11/15/2023 by Harlene Bill, NP  Last OV  03/18/2024 Upcoming appt = none

## 2024-04-26 MED ORDER — ROSUVASTATIN CALCIUM 10 MG PO TABS: 10.0000 mg | ORAL_TABLET | Freq: Every day | ORAL | 3 refills | Status: AC

## 2024-04-26 NOTE — Addendum Note (Signed)
 Addended by: Aine Strycharz E on: 04/26/2024 12:02 PM   Modules accepted: Orders
